# Patient Record
Sex: Female | Born: 1955
Health system: Southern US, Community
[De-identification: ages and names within clinical notes are randomized; demographics above are authoritative.]

## PROBLEM LIST (undated history)

## (undated) DIAGNOSIS — T7840XA Allergy, unspecified, initial encounter: Secondary | ICD-10-CM

## (undated) DIAGNOSIS — E119 Type 2 diabetes mellitus without complications: Secondary | ICD-10-CM

## (undated) HISTORY — DX: Type 2 diabetes mellitus without complications: E11.9

## (undated) HISTORY — DX: Allergy, unspecified, initial encounter: T78.40XA

---

## 1999-01-12 ENCOUNTER — Other Ambulatory Visit: Admission: RE | Admit: 1999-01-12 | Discharge: 1999-01-12 | Payer: Self-pay | Admitting: Gynecology

## 1999-11-14 ENCOUNTER — Other Ambulatory Visit: Admission: RE | Admit: 1999-11-14 | Discharge: 1999-11-14 | Payer: Self-pay | Admitting: Gynecology

## 1999-11-14 ENCOUNTER — Encounter (INDEPENDENT_AMBULATORY_CARE_PROVIDER_SITE_OTHER): Payer: Self-pay

## 2001-01-10 ENCOUNTER — Other Ambulatory Visit: Admission: RE | Admit: 2001-01-10 | Discharge: 2001-01-10 | Payer: Self-pay | Admitting: Gynecology

## 2002-02-22 ENCOUNTER — Emergency Department (HOSPITAL_COMMUNITY): Admission: EM | Admit: 2002-02-22 | Discharge: 2002-02-22 | Payer: Self-pay | Admitting: Emergency Medicine

## 2002-02-22 ENCOUNTER — Encounter: Payer: Self-pay | Admitting: Emergency Medicine

## 2002-02-26 ENCOUNTER — Other Ambulatory Visit: Admission: RE | Admit: 2002-02-26 | Discharge: 2002-02-26 | Payer: Self-pay | Admitting: Gynecology

## 2002-02-27 ENCOUNTER — Ambulatory Visit (HOSPITAL_COMMUNITY): Admission: RE | Admit: 2002-02-27 | Discharge: 2002-02-27 | Payer: Self-pay | Admitting: Neurology

## 2002-02-27 ENCOUNTER — Encounter: Payer: Self-pay | Admitting: Neurology

## 2003-02-23 ENCOUNTER — Other Ambulatory Visit: Admission: RE | Admit: 2003-02-23 | Discharge: 2003-02-23 | Payer: Self-pay | Admitting: Gynecology

## 2004-02-17 ENCOUNTER — Other Ambulatory Visit: Admission: RE | Admit: 2004-02-17 | Discharge: 2004-02-17 | Payer: Self-pay | Admitting: Gynecology

## 2005-06-22 ENCOUNTER — Other Ambulatory Visit: Admission: RE | Admit: 2005-06-22 | Discharge: 2005-06-22 | Payer: Self-pay | Admitting: Gynecology

## 2006-06-25 ENCOUNTER — Other Ambulatory Visit: Admission: RE | Admit: 2006-06-25 | Discharge: 2006-06-25 | Payer: Self-pay | Admitting: Gynecology

## 2008-01-13 ENCOUNTER — Other Ambulatory Visit: Admission: RE | Admit: 2008-01-13 | Discharge: 2008-01-13 | Payer: Self-pay | Admitting: Gynecology

## 2013-11-14 ENCOUNTER — Encounter: Payer: Self-pay | Admitting: Podiatrist

## 2013-11-14 ENCOUNTER — Ambulatory Visit (INDEPENDENT_AMBULATORY_CARE_PROVIDER_SITE_OTHER): Payer: 59 | Admitting: Podiatrist

## 2013-11-14 VITALS — BP 140/79 | HR 72 | Resp 12

## 2013-11-14 DIAGNOSIS — L84 Corns and callosities: Secondary | ICD-10-CM

## 2013-11-14 NOTE — Progress Notes (Signed)
Chief Complaint  Patient presents with  . Callouses    ''both feet have callus and it hurts.''     HPI: Patient is 58 y.o. female who presents today for calluses bilateral feet-- they are painful and uncomfortable when she walks.  She has tried shoe gear changes and states the calluses continue to return.      Physical Exam GENERAL APPEARANCE: Alert, conversant. Appropriately groomed. No acute distress.  VASCULAR: Pedal pulses palpable at 2/4 DP and PT bilateral.  Capillary refill time is immediate to all digits,  Proximal to distal cooling it warm to warm.  Digital hair growth is present bilateral  NEUROLOGIC: sensation is intact epicritically and protectively to 5.07 monofilament at 5/5 sites bilateral.  Light touch is intact bilateral, vibratory sensation intact bilateral, achilles tendon reflex is intact bilateral.  MUSCULOSKELETAL: acceptable muscle strength, tone and stability bilateral.  Intrinsic muscluature intact bilateral.  Rectus appearance of foot and digits noted bilateral.   DERMATOLOGIC: large calluses present plantar right foot submet 3 and 1 region bilateral.  They are enucleated and intact integument is present after debridement.  Assessment:callus x 2  Plan: debridement of calluses carried out today.  She will be seen back when they give her problems in the future.

## 2013-11-14 NOTE — Patient Instructions (Signed)

## 2014-09-16 ENCOUNTER — Ambulatory Visit (INDEPENDENT_AMBULATORY_CARE_PROVIDER_SITE_OTHER): Payer: 59 | Admitting: Podiatrist

## 2014-09-16 ENCOUNTER — Encounter: Payer: Self-pay | Admitting: Podiatrist

## 2014-09-16 DIAGNOSIS — B351 Tinea unguium: Secondary | ICD-10-CM

## 2014-09-16 DIAGNOSIS — L84 Corns and callosities: Secondary | ICD-10-CM | POA: Diagnosis not present

## 2014-09-16 DIAGNOSIS — Z79899 Other long term (current) drug therapy: Secondary | ICD-10-CM | POA: Diagnosis not present

## 2014-09-16 MED ORDER — TERBINAFINE HCL 250 MG PO TABS: 250.0000 mg | ORAL_TABLET | Freq: Every day | ORAL | Status: DC

## 2014-09-16 MED ORDER — LACTIC ACID 10 % EX LOTN: 1.0000 "application " | TOPICAL_LOTION | Freq: Every day | CUTANEOUS | Status: DC

## 2014-09-16 NOTE — Patient Instructions (Signed)

## 2014-09-16 NOTE — Progress Notes (Signed)
   Subjective:    Patient ID: Kristina MurrayMonica P Pettie, female    DOB: 10/14/1955, 59 y.o.   MRN: [id number]  HPI  PT STATED RT FOOT GREAT TOENAIL HAVE DISCOLORATION AND THICK FOR 2 MONTHS. THE TOENAIL IS GETTING WORSE BUT IS NOT HURTING. TRIED NO TREATMENT.  Review of Systems  All other systems reviewed and are negative.      Objective:   Physical Exam  Patient is awake, alert, and oriented x 3.  In no acute distress.  Vascular status is intact with palpable pedal pulses at 2/4 DP and PT bilateral and capillary refill time within normal limits. Neurological sensation is also intact bilaterally via Semmes Weinstein monofilament at 5/5 sites. Light touch, vibratory sensation, Achilles tendon reflex is intact. Dermatological exam reveals skin color, turger and texture as normal. No open lesions present.  Musculature intact with dorsiflexion, plantarflexion, inversion, eversion.  Right great toenail is thick, discolored, and does appear mycotic in nature.  It is bothersome and painful to the patient.  Mycotic infection is likely present.     Assessment & Plan:   Mycotic toenail infection of the right great toenail  Plan:  Discussed options and alternatives.  Due to the extent of the infection, recommended lamisil.  rx for the medication as well as blood work was dispensed.  i will see her back for a recheck of the lamisil in 4 weeks and repeat blood test will be ordered.

## 2014-09-17 ENCOUNTER — Telehealth: Payer: Self-pay | Admitting: *Deleted

## 2014-09-17 LAB — HEPATIC FUNCTION PANEL
ALT: 25 U/L (ref 0–35)
AST: 20 U/L (ref 0–37)
Albumin: 4.1 g/dL (ref 3.5–5.2)
Alkaline Phosphatase: 80 U/L (ref 39–117)
Bilirubin, Direct: 0.1 mg/dL (ref 0.0–0.3)
Indirect Bilirubin: 0.2 mg/dL (ref 0.2–1.2)
Total Bilirubin: 0.3 mg/dL (ref 0.2–1.2)
Total Protein: 7.1 g/dL (ref 6.0–8.3)

## 2014-09-17 NOTE — Telephone Encounter (Signed)
-----   Message from Delories HeinzKathryn P Egerton, DPM sent at 09/17/2014  8:55 AM EDT ----- Regarding: labs great Labs are great! Ok to take the medication  Thanks!  E ----- Message -----    From: [facility]    Sent: 09/17/2014   1:44 AM      To: Delories HeinzKathryn P Egerton, DPM

## 2014-09-17 NOTE — Telephone Encounter (Signed)
I called patient.  Calling to let you know Dr. Irving ShowsEgerton said your labs are okay.  You can start you medication.  "Okay, thanks for calling to tell me.  I'll go pick it up."

## 2014-10-14 ENCOUNTER — Ambulatory Visit: Payer: 59 | Admitting: Podiatrist

## 2014-10-23 ENCOUNTER — Ambulatory Visit (INDEPENDENT_AMBULATORY_CARE_PROVIDER_SITE_OTHER): Payer: 59 | Admitting: Podiatry

## 2014-10-23 ENCOUNTER — Encounter: Payer: Self-pay | Admitting: Podiatry

## 2014-10-23 DIAGNOSIS — L84 Corns and callosities: Secondary | ICD-10-CM | POA: Diagnosis not present

## 2014-10-24 NOTE — Progress Notes (Signed)
Subjective:     Patient ID: Kristina Long, female   DOB: [birth year], 59 y.o.   MRN: [id number]  HPI patient presents with lesions on the plantar aspect of both feet that are sore and hard for her to cut and also that the Lamisil seems to be making a difference   Review of Systems     Objective:   Physical Exam Neurovascular status intact with thick keratotic lesions bilateral plantar feet with nails with a slight bit of proximal clearing occurring    Assessment:     Keratotic lesion secondary to pressure and mycotic nail disease    Plan:     Debride lesions on both feet with no iatrogenic bleeding and continue 30 more days of Lamisil and reappoint in approximately 3 months or earlier if needed

## 2015-03-25 ENCOUNTER — Encounter: Payer: Self-pay | Admitting: Podiatry

## 2015-03-25 ENCOUNTER — Ambulatory Visit (INDEPENDENT_AMBULATORY_CARE_PROVIDER_SITE_OTHER): Payer: 59 | Admitting: Podiatry

## 2015-03-25 VITALS — BP 119/74 | HR 83 | Resp 16

## 2015-03-25 DIAGNOSIS — L84 Corns and callosities: Secondary | ICD-10-CM

## 2015-03-25 NOTE — Progress Notes (Signed)
Subjective:     Patient ID: Kristina Long, female   DOB: 08-16-1955, 59 y.o.   MRN: [id number]  HPI patient presents with 4 calluses on both feet they get sore and thick   Review of Systems     Objective:   Physical Exam Neurovascular status intact with keratotic lesions 4 right and left foot    Assessment:     Callus formation    Plan:     Debride 8 separate lesions with no iatrogenic bleeding noted

## 2015-06-21 ENCOUNTER — Ambulatory Visit (INDEPENDENT_AMBULATORY_CARE_PROVIDER_SITE_OTHER): Payer: 59 | Admitting: Podiatry

## 2015-06-21 ENCOUNTER — Encounter: Payer: Self-pay | Admitting: Podiatry

## 2015-06-21 DIAGNOSIS — L84 Corns and callosities: Secondary | ICD-10-CM

## 2015-06-21 NOTE — Progress Notes (Signed)
Patient ID: Kristina MurrayMonica P Long, female   DOB: 1955/08/27, 59 y.o.   MRN: [id number] This patient presents the office with chief complaint of painful calluses on her right foot. She states that the calluses become painful and sore walking and wearing her shoes. She returns the office at regular intervals for treatment of these painful calluses  Objective GENERAL APPEARANCE: Alert, conversant. Appropriately groomed. No acute distress.  VASCULAR: Pedal pulses palpable at  [facility] and PT bilateral.  Capillary refill time is immediate to all digits,  Normal temperature gradient.  Digital hair growth is present bilateral  NEUROLOGIC: sensation is normal to 5.07 monofilament at 5/5 sites bilateral.  Light touch is intact bilateral, Muscle strength normal.  MUSCULOSKELETAL: acceptable muscle strength, tone and stability bilateral.  Intrinsic muscluature intact bilateral.  Rectus appearance of foot and digits noted bilateral.   DERMATOLOGIC: skin color, texture, and turgor are within normal limits.  No preulcerative lesions or ulcers  are seen, no interdigital maceration noted.  No open lesions present.  Digital nails are asymptomatic. No drainage noted.Callus sub3, sub 1 and sub IPJ right.  Heloma durum third toe right foot.  Diagnosis  Callus right foot  Treatment  Debridement of callus right foot.  RTC 3 months.Marland Kitchen.Marland Kitchen.Helane GuntherGregory Marquesha Robideau DPM

## 2015-08-27 ENCOUNTER — Encounter: Payer: Self-pay | Admitting: Sports Medicine

## 2015-08-27 ENCOUNTER — Ambulatory Visit (INDEPENDENT_AMBULATORY_CARE_PROVIDER_SITE_OTHER): Payer: 59 | Admitting: Sports Medicine

## 2015-08-27 DIAGNOSIS — L84 Corns and callosities: Secondary | ICD-10-CM | POA: Diagnosis not present

## 2015-08-27 DIAGNOSIS — E119 Type 2 diabetes mellitus without complications: Secondary | ICD-10-CM

## 2015-08-27 DIAGNOSIS — B353 Tinea pedis: Secondary | ICD-10-CM

## 2015-08-27 MED ORDER — CLOTRIMAZOLE 1 % EX SOLN: 1.0000 "application " | Freq: Two times a day (BID) | CUTANEOUS | Status: DC

## 2015-08-27 NOTE — Progress Notes (Signed)
Patient ID: TEMPERENCE ZENOR, female   DOB: 1955/09/10, 60 y.o.   MRN: [id number] Subjective: Kristina Long is a 60 y.o. female patient with history of type 2 diabetes who presents to office today complaining of long, painful callus while ambulating in shoes; unable to trim. Patient states that the glucose reading this morning was not recorded.. Patient denies any new changes in medication or new problems. Patient denies any new cramping, numbness, burning or tingling in the legs.  There are no active problems to display for this patient.  Current Outpatient Prescriptions on File Prior to Visit  Medication Sig Dispense Refill  . Lactic Acid 10 % LOTN Apply 1 application topically daily. 1 Tube 2  . loratadine (CLARITIN) 10 MG tablet Take 10 mg by mouth daily.     No current facility-administered medications on file prior to visit.   No Known Allergies  No results found for this or any previous visit (from the past 2160 hour(s)).  Objective: General: Patient is awake, alert, and oriented x 3 and in no acute distress.  Integument: Skin is warm, dry and supple bilateral. Nails are well manicured, + right foot webspace maceration consistent with tinea. No other  signs of infection. No open lesions, + callus sub met 1,3, hallux ipj, dorsal 5th and 3rd toe. Remaining integument unremarkable.  Vasculature:  Dorsalis Pedis pulse 2/4 bilateral. Posterior Tibial pulse  2/4 bilateral.  Capillary fill time <3 sec 1-5 bilateral. Positive hair growth to the level of the digits. Temperature gradient within normal limits. No varicosities present bilateral. No edema present bilateral.   Neurology: The patient has intact sensation measured with a 5.07/10g Semmes Weinstein Monofilament at all pedal sites bilateral . Vibratory sensation intact bilateral with tuning fork. No Babinski sign present bilateral.   Musculoskeletal: Right>left hammertoe bilateral. Muscular strength 5/5 in all lower extremity  muscular groups bilateral without pain or limitation on range of motion . No tenderness with calf compression bilateral.  Assessment and Plan: Problem List Items Addressed This Visit    None    Visit Diagnoses    Tinea pedis of right foot    -  Primary    Relevant Medications    clotrimazole (LOTRIMIN) 1 % external solution    Callus of foot        Diabetes mellitus without complication (HCC)        FBS not recorded       -Examined patient. -Discussed and educated patient on diabetic foot care, especially with  regards to the vascular, neurological and musculoskeletal systems.  -Stressed the importance of good glycemic control and the detriment of not  controlling glucose levels in relation to the foot. -Mechanically debrided callus to right using sterile chisel blade without incident and medicated with salinocaine -Gave toe protectors -Recommend good supportive shoes for foot type and skin emollients -Rx Clotrimazole solution for tinea -Answered all patient questions -Patient to return as needed or in 3 months for at risk foot care -Patient advised to call the office if any problems or questions arise in the  Meantime.  Landis Martins, DPM

## 2015-09-29 ENCOUNTER — Ambulatory Visit (INDEPENDENT_AMBULATORY_CARE_PROVIDER_SITE_OTHER): Payer: 59 | Admitting: Podiatry

## 2015-09-29 ENCOUNTER — Ambulatory Visit (INDEPENDENT_AMBULATORY_CARE_PROVIDER_SITE_OTHER): Payer: 59

## 2015-09-29 ENCOUNTER — Encounter: Payer: Self-pay | Admitting: Podiatry

## 2015-09-29 DIAGNOSIS — E119 Type 2 diabetes mellitus without complications: Secondary | ICD-10-CM | POA: Diagnosis not present

## 2015-09-29 DIAGNOSIS — M79674 Pain in right toe(s): Secondary | ICD-10-CM

## 2015-09-29 DIAGNOSIS — E1149 Type 2 diabetes mellitus with other diabetic neurological complication: Secondary | ICD-10-CM

## 2015-09-29 DIAGNOSIS — M216X9 Other acquired deformities of unspecified foot: Secondary | ICD-10-CM | POA: Diagnosis not present

## 2015-09-29 DIAGNOSIS — M722 Plantar fascial fibromatosis: Secondary | ICD-10-CM

## 2015-09-29 DIAGNOSIS — E1140 Type 2 diabetes mellitus with diabetic neuropathy, unspecified: Secondary | ICD-10-CM

## 2015-09-29 NOTE — Progress Notes (Signed)
Subjective:     Patient ID: Kristina Long, female   DOB: February 15, 1956, 60 y.o.   MRN: [id number]  HPI patient presents with callus plantar third metatarsal of both feet that are very painful and making it difficult to walk. States the trimming has only been giving her partial relief   Review of Systems     Objective:   Physical Exam Neurovascular status intact with cavus deformity creating plantar keratotic lesions that are painful when pressed and making walking difficult    Assessment:     Plantarflexed metatarsal with cavus deformity creating pain    Plan:     Reviewed condition and explaining condition and at this point debridement accomplished and I discussed orthotics to try to reduce plantar pressures. Patient is scanned for customized orthotics at this time

## 2015-10-27 ENCOUNTER — Ambulatory Visit: Payer: 59 | Admitting: Podiatry

## 2015-10-29 ENCOUNTER — Ambulatory Visit (INDEPENDENT_AMBULATORY_CARE_PROVIDER_SITE_OTHER): Payer: 59 | Admitting: Podiatry

## 2015-10-29 ENCOUNTER — Encounter: Payer: Self-pay | Admitting: Podiatry

## 2015-10-29 VITALS — BP 126/77 | HR 73 | Resp 16

## 2015-10-29 DIAGNOSIS — M2040 Other hammer toe(s) (acquired), unspecified foot: Secondary | ICD-10-CM

## 2015-10-29 DIAGNOSIS — E119 Type 2 diabetes mellitus without complications: Secondary | ICD-10-CM

## 2015-10-31 NOTE — Progress Notes (Signed)
Subjective:     Patient ID: Kristina MurrayMonica P Long, female   DOB: [birth year], 60 y.o.   MRN: [id number]  HPI patient presents to pickup orthotics and also complains about digital deformities with elevating hammertoe type deformities right foot   Review of Systems     Objective:   Physical Exam Neurovascular status intact muscle strength adequate range of motion within normal limits with patient noted to have keratotic lesions third and fifth digit of the right foot with rotated fifth digit noted and also is noted to have mild plantar keratotic lesion still present    Assessment:     Hammertoe deformity digits 3 and 5 right with structural malalignment and also noted to have plantar calluses    Plan:     Reviewed hammertoe correction that could be considered and at this point debrided lesions and reappoint for evaluation again in the next several months and may need to consider arthroplasty

## 2016-09-06 ENCOUNTER — Encounter: Payer: Self-pay | Admitting: Podiatry

## 2016-09-06 ENCOUNTER — Ambulatory Visit (INDEPENDENT_AMBULATORY_CARE_PROVIDER_SITE_OTHER): Payer: 59 | Admitting: Podiatry

## 2016-09-06 DIAGNOSIS — M2040 Other hammer toe(s) (acquired), unspecified foot: Secondary | ICD-10-CM | POA: Diagnosis not present

## 2016-09-06 DIAGNOSIS — M216X9 Other acquired deformities of unspecified foot: Secondary | ICD-10-CM | POA: Diagnosis not present

## 2016-09-06 DIAGNOSIS — L84 Corns and callosities: Secondary | ICD-10-CM

## 2016-09-08 NOTE — Progress Notes (Signed)
Subjective:     Patient ID: Kristina Long, female   DOB: 01/23/1956, 61 y.o.   MRN: [id number]  HPI patient presents with significant callus formation and also digital deformities with pain in the lesser digits and a high arch foot type. Patient also presents today to pickup orthotics   Review of Systems     Objective:   Physical Exam Neurovascular status intact with significant digital deformities right over left with keratotic lesions sub-bilateral feet    Assessment:     Chronic lesion formation was structural changes of both feet with pain    Plan:     H&P discussed structural correction with orthotics dispensed and debrided lesions today. Patient will be seen back to recheck again for routine care

## 2016-09-12 DIAGNOSIS — E119 Type 2 diabetes mellitus without complications: Secondary | ICD-10-CM | POA: Diagnosis not present

## 2016-09-21 DIAGNOSIS — Z1231 Encounter for screening mammogram for malignant neoplasm of breast: Secondary | ICD-10-CM | POA: Diagnosis not present

## 2016-10-30 ENCOUNTER — Encounter: Payer: Self-pay | Admitting: Podiatry

## 2016-10-30 ENCOUNTER — Ambulatory Visit (INDEPENDENT_AMBULATORY_CARE_PROVIDER_SITE_OTHER): Payer: 59 | Admitting: Podiatry

## 2016-10-30 DIAGNOSIS — E1149 Type 2 diabetes mellitus with other diabetic neurological complication: Secondary | ICD-10-CM | POA: Diagnosis not present

## 2016-10-30 DIAGNOSIS — E1140 Type 2 diabetes mellitus with diabetic neuropathy, unspecified: Secondary | ICD-10-CM | POA: Diagnosis not present

## 2016-10-30 DIAGNOSIS — L84 Corns and callosities: Secondary | ICD-10-CM

## 2016-10-30 DIAGNOSIS — Q828 Other specified congenital malformations of skin: Secondary | ICD-10-CM | POA: Diagnosis not present

## 2016-11-01 NOTE — Progress Notes (Signed)
Subjective:    Patient ID: Kristina Long, female   DOB: 61 y.o.   MRN: [id number]   HPI patient presents with long-term diabetic neuropathy and lesion formation bilateral that are painful and she cannot cut    ROS      Objective:  Physical Exam Diminished neurological status with long-term diabetes and plantar keratotic lesion bilateral    Assessment:   Porokeratotic lesion with at risk diabetic with neuropathy      Plan:     Debris lesions bilateral with no iatrogenic bleeding noted

## 2017-01-26 ENCOUNTER — Ambulatory Visit (INDEPENDENT_AMBULATORY_CARE_PROVIDER_SITE_OTHER): Payer: 59 | Admitting: Podiatry

## 2017-01-26 ENCOUNTER — Encounter: Payer: Self-pay | Admitting: Podiatry

## 2017-01-26 DIAGNOSIS — Q828 Other specified congenital malformations of skin: Secondary | ICD-10-CM | POA: Diagnosis not present

## 2017-01-26 DIAGNOSIS — E1140 Type 2 diabetes mellitus with diabetic neuropathy, unspecified: Secondary | ICD-10-CM | POA: Diagnosis not present

## 2017-01-26 DIAGNOSIS — E1149 Type 2 diabetes mellitus with other diabetic neurological complication: Secondary | ICD-10-CM

## 2017-01-26 NOTE — Progress Notes (Signed)
Subjective:    Patient ID: Kristina MurrayMonica P Schnackenberg, female   DOB: 61 y.o.   MRN: [id number]   HPI long-term diabetic presents with chronic callus formation bilateral that become painful and she cannot cut with numerous risk factors    ROS      Objective:  Physical Exam neurovascular status intact with patient noted to have diminishment sharp Dole vibratory bilateral and long-term diabetes with severe keratotic lesion subsecond fifth metatarsals bilateral     Assessment:    Porokeratotic type lesions with diabetic neuropathy as, getting factor     Plan:    Deep debridement of lesions accomplished no iatrogenic bleeding and reappoint for routine care

## 2017-03-16 DIAGNOSIS — Z7984 Long term (current) use of oral hypoglycemic drugs: Secondary | ICD-10-CM | POA: Diagnosis not present

## 2017-03-16 DIAGNOSIS — E1165 Type 2 diabetes mellitus with hyperglycemia: Secondary | ICD-10-CM | POA: Diagnosis not present

## 2017-04-05 ENCOUNTER — Ambulatory Visit (INDEPENDENT_AMBULATORY_CARE_PROVIDER_SITE_OTHER): Payer: 59 | Admitting: Podiatry

## 2017-04-05 ENCOUNTER — Encounter: Payer: Self-pay | Admitting: Podiatry

## 2017-04-05 DIAGNOSIS — M2040 Other hammer toe(s) (acquired), unspecified foot: Secondary | ICD-10-CM | POA: Diagnosis not present

## 2017-04-05 DIAGNOSIS — E1149 Type 2 diabetes mellitus with other diabetic neurological complication: Secondary | ICD-10-CM | POA: Diagnosis not present

## 2017-04-05 DIAGNOSIS — M216X9 Other acquired deformities of unspecified foot: Secondary | ICD-10-CM

## 2017-04-05 DIAGNOSIS — Q828 Other specified congenital malformations of skin: Secondary | ICD-10-CM | POA: Diagnosis not present

## 2017-04-05 DIAGNOSIS — E1140 Type 2 diabetes mellitus with diabetic neuropathy, unspecified: Secondary | ICD-10-CM

## 2017-04-05 NOTE — Progress Notes (Signed)
   Subjective:    Patient ID: Kristina Long, female    DOB: [birth year], 61 y.o.   MRN: [id number]  HPI    Review of Systems  All other systems reviewed and are negative.      Objective:   Physical Exam        Assessment & Plan:

## 2017-04-05 NOTE — Progress Notes (Signed)
Subjective:    Patient ID: Kristina Long, female   DOB: 61 y.o.   MRN: [id number]   HPI patient presents stating this callus on the right foot is still really bothering me and making it hard to walk. Patient states that she cannot go barefoot and only can wear certain shoes    ROS      Objective:  Physical Exam neurovascular status intact with severe keratotic lesion sub-third metatarsal right with elevation of the third toe with keratotic lesions and keratotic lesion left it's not as deep     Assessment:   Surgical plantar flexion of the metatarsal with digital hammertoe deformity with severe keratotic tissue formation      Plan:    H&P condition reviewed and at this point I do think at one point elevating osteotomy with digital fusion may be necessary. I educated her on this and discuss what would be required and at this point were to go ahead and do deep debridement today and continue to monitor her with consideration for surgery at one point in future

## 2017-04-10 DIAGNOSIS — Z23 Encounter for immunization: Secondary | ICD-10-CM | POA: Diagnosis not present

## 2017-05-07 DIAGNOSIS — Z13820 Encounter for screening for osteoporosis: Secondary | ICD-10-CM | POA: Diagnosis not present

## 2017-05-07 DIAGNOSIS — Z0142 Encounter for cervical smear to confirm findings of recent normal smear following initial abnormal smear: Secondary | ICD-10-CM | POA: Diagnosis not present

## 2017-05-07 DIAGNOSIS — E119 Type 2 diabetes mellitus without complications: Secondary | ICD-10-CM | POA: Diagnosis not present

## 2017-05-07 DIAGNOSIS — Z01419 Encounter for gynecological examination (general) (routine) without abnormal findings: Secondary | ICD-10-CM | POA: Diagnosis not present

## 2017-06-08 ENCOUNTER — Encounter: Payer: Self-pay | Admitting: Podiatry

## 2017-06-08 ENCOUNTER — Ambulatory Visit: Payer: 59 | Admitting: Podiatry

## 2017-06-08 DIAGNOSIS — E1140 Type 2 diabetes mellitus with diabetic neuropathy, unspecified: Secondary | ICD-10-CM | POA: Diagnosis not present

## 2017-06-08 DIAGNOSIS — E1149 Type 2 diabetes mellitus with other diabetic neurological complication: Secondary | ICD-10-CM

## 2017-06-08 DIAGNOSIS — Q828 Other specified congenital malformations of skin: Secondary | ICD-10-CM | POA: Diagnosis not present

## 2017-06-09 NOTE — Progress Notes (Signed)
Subjective:   Patient ID: Kristina Long, female   DOB: 61 y.o.   MRN: [id number]   HPI Long-term diabetic with severe lesions plantar aspect both feet that become very tender with ambulation   ROS      Objective:  Physical Exam  Neurovascular status intact with keratotic lesions bilateral that are painful when pressed     Assessment:  Lesions that are due to chronic pressure with neurological symptoms associated with her long-term diabetes     Plan:  Reviewed diabetic inspections on a daily basis and debrided the lesions bilateral with no iatrogenic bleeding noted

## 2017-06-20 DIAGNOSIS — E1165 Type 2 diabetes mellitus with hyperglycemia: Secondary | ICD-10-CM | POA: Diagnosis not present

## 2017-08-03 ENCOUNTER — Encounter: Payer: Self-pay | Admitting: Podiatry

## 2017-08-03 ENCOUNTER — Ambulatory Visit: Payer: 59 | Admitting: Podiatry

## 2017-08-03 DIAGNOSIS — Q828 Other specified congenital malformations of skin: Secondary | ICD-10-CM

## 2017-08-03 DIAGNOSIS — E1149 Type 2 diabetes mellitus with other diabetic neurological complication: Secondary | ICD-10-CM | POA: Diagnosis not present

## 2017-08-03 DIAGNOSIS — E1140 Type 2 diabetes mellitus with diabetic neuropathy, unspecified: Secondary | ICD-10-CM | POA: Diagnosis not present

## 2017-08-03 NOTE — Progress Notes (Signed)
Subjective:   Patient ID: Kristina MurrayMonica P Favia, female   DOB: 62 y.o.   MRN: [id number]   HPI Patient presents with chronic lesion sub-both feet with long-term diabetes   ROS      Objective:  Physical Exam  No change neurovascular status with thick keratotic lesions plantar aspect of both feet     Assessment:  Chronic lesion formation secondary to pressure with diabetes as precipitating factor     Plan:  Debride painful lesions bilateral with no iatrogenic bleeding and reappoint to reevaluate

## 2017-10-05 ENCOUNTER — Ambulatory Visit: Payer: 59 | Admitting: Podiatry

## 2017-10-11 ENCOUNTER — Ambulatory Visit: Payer: 59 | Admitting: Podiatry

## 2017-10-11 ENCOUNTER — Encounter: Payer: Self-pay | Admitting: Podiatry

## 2017-10-11 DIAGNOSIS — E1140 Type 2 diabetes mellitus with diabetic neuropathy, unspecified: Secondary | ICD-10-CM | POA: Diagnosis not present

## 2017-10-11 DIAGNOSIS — Q828 Other specified congenital malformations of skin: Secondary | ICD-10-CM | POA: Diagnosis not present

## 2017-10-11 DIAGNOSIS — E1149 Type 2 diabetes mellitus with other diabetic neurological complication: Secondary | ICD-10-CM

## 2017-10-11 NOTE — Progress Notes (Signed)
Subjective:   Patient ID: Kristina Long, female   DOB: 62 y.o.   MRN: [id number]   HPI Long-term diabetic with severe lesion formation bilateral that become very painful   ROS      Objective:  Physical Exam  Diminished neurovascular status with long-term diabetes with severe lesion formation bilateral     Assessment:  At risk neurological diabetic condition with lesion formation bilateral     Plan:  Deep debridement of all lesions with sharp instrumentation with no iatrogenic bleeding and reappoint to recheck

## 2017-10-12 DIAGNOSIS — Z1231 Encounter for screening mammogram for malignant neoplasm of breast: Secondary | ICD-10-CM | POA: Diagnosis not present

## 2017-11-16 DIAGNOSIS — E119 Type 2 diabetes mellitus without complications: Secondary | ICD-10-CM | POA: Diagnosis not present

## 2017-12-14 ENCOUNTER — Ambulatory Visit: Payer: 59 | Admitting: Podiatry

## 2017-12-14 ENCOUNTER — Encounter: Payer: Self-pay | Admitting: Podiatry

## 2017-12-14 DIAGNOSIS — E1149 Type 2 diabetes mellitus with other diabetic neurological complication: Secondary | ICD-10-CM | POA: Diagnosis not present

## 2017-12-14 DIAGNOSIS — Q828 Other specified congenital malformations of skin: Secondary | ICD-10-CM

## 2017-12-14 DIAGNOSIS — E1140 Type 2 diabetes mellitus with diabetic neuropathy, unspecified: Secondary | ICD-10-CM

## 2017-12-14 NOTE — Progress Notes (Signed)
Subjective:   Patient ID: Kristina MurrayMonica P Long, female   DOB: 62 y.o.   MRN: [id number]   HPI Patient presents with severe lesion formation bilateral   ROS      Objective:  Physical Exam  Neurovascular status intact with thick keratotic lesion plantar aspect bilateral with patient with long-term diabetes     Assessment:  Chronic lesion formation with patient with long-term diabetes     Plan:  Debrided lesions bilateral with no iatrogenic bleeding can continue routine care

## 2018-01-29 ENCOUNTER — Ambulatory Visit: Payer: 59 | Admitting: Podiatry

## 2018-01-29 ENCOUNTER — Encounter: Payer: Self-pay | Admitting: Podiatry

## 2018-01-29 DIAGNOSIS — L84 Corns and callosities: Secondary | ICD-10-CM

## 2018-01-29 DIAGNOSIS — M2040 Other hammer toe(s) (acquired), unspecified foot: Secondary | ICD-10-CM

## 2018-01-29 DIAGNOSIS — E1140 Type 2 diabetes mellitus with diabetic neuropathy, unspecified: Secondary | ICD-10-CM | POA: Diagnosis not present

## 2018-01-29 DIAGNOSIS — Q828 Other specified congenital malformations of skin: Secondary | ICD-10-CM | POA: Diagnosis not present

## 2018-01-29 DIAGNOSIS — E1149 Type 2 diabetes mellitus with other diabetic neurological complication: Secondary | ICD-10-CM

## 2018-01-29 NOTE — Progress Notes (Signed)
This patient presents to the office for continued treatment of her painful callus both feet.  She says the calluses are painful walking and wearing her shoes.  She says the callus on the bottom of her right forefoot and her pinch callus right big toe are the most painful.  She says years ago. Her mother had an injection at the site of a hammertoe callus and the toe blew up.  Therefore, she says she is not interested in any surgical correction.  She presents the office today for preventative foot care services.  General Appearance  Alert, conversant and in no acute stress.  Vascular  Dorsalis pedis and posterior tibial  pulses are palpable  bilaterally.  Capillary return is within normal limits  bilaterally. Temperature is within normal limits  bilaterally.  Neurologic  Senn-Weinstein monofilament wire test within normal limits  bilaterally. Muscle power within normal limits bilaterally.  Nails Normal nails noted with no evidence of bacterial or fungal infection.  Orthopedic  No limitations of motion of motion feet .  No crepitus or effusions noted.  Hammer toes 2-5  B/L.  Skin  normotropic skin  noted bilaterally.  No signs of infections or ulcers noted.  IPK sub 3 right foot.  Heloma durum 3rd toe right and fifth toes  B/l.  Pinch callus right hallux.  Callus  B/L  Debridement of callus  B/l.  RTC prn.   Helane GuntherGregory Vandy Tsuchiya DPM.

## 2018-02-18 DIAGNOSIS — E1165 Type 2 diabetes mellitus with hyperglycemia: Secondary | ICD-10-CM | POA: Diagnosis not present

## 2018-04-11 ENCOUNTER — Encounter: Payer: Self-pay | Admitting: Internal Medicine

## 2018-04-11 ENCOUNTER — Ambulatory Visit (INDEPENDENT_AMBULATORY_CARE_PROVIDER_SITE_OTHER): Payer: 59 | Admitting: Internal Medicine

## 2018-04-11 VITALS — BP 116/72 | HR 71 | Ht 70.0 in | Wt 214.0 lb

## 2018-04-11 DIAGNOSIS — E119 Type 2 diabetes mellitus without complications: Secondary | ICD-10-CM

## 2018-04-11 LAB — POCT GLYCOSYLATED HEMOGLOBIN (HGB A1C): Hemoglobin A1C: 7 % — AB (ref 4.0–5.6)

## 2018-04-11 MED ORDER — METFORMIN HCL 500 MG PO TABS: 1000.0000 mg | ORAL_TABLET | Freq: Two times a day (BID) | ORAL | 11 refills | Status: DC

## 2018-04-11 NOTE — Patient Instructions (Addendum)
-   Please increase Metformin to two tablets twice a day with meals. - Continue Januvia 100 mg daily - If you are interested in a GLP-1 Agonist such as Trulicity or Ozempic which are once weekly injections please contact our office.  - Please avoid all sugar sweetened beverages when possible. - If you change your mind about seeing a nutritionist or a dietician please contact your office.

## 2018-04-11 NOTE — Progress Notes (Signed)
Name: Kristina Long  MRN/ DOB: [id number], December 27, 1955   Age/ Sex: 62 y.o., female    PCP: Blair Heys, MD   Reason for Endocrinology Evaluation: Type 2 Diabetes Mellitus  Date of Initial Endocrinology Visit: 04/12/2018     PATIENT IDENTIFIER: Kristina Long is a 62 y.o.  female with a past medical history of T2DM, Seasonal allergies and OSA. The patient presented for initial endocrinology clinic visit on 04/12/2018 for consultative assistance with her diabetes management.    HPI: Kristina Long was diagnosed with DM in 2013. She has been on Metformin since her diagnosis. As of late her A1c   Her hemoglobin A1c has ranged from  in 7.3% in 2018, peaking at 10.5 % in 2013.  Currently she does not check blood sugars. She takes Metformin mainly once a day and tends to sometimes forget the 2nd dose in the evening. She has been tried on [place] but she just "doesn't feel good on them " when asked to elaborate on her symptoms, she states it just doesn't make her feel like herself.   She also was prescribed Pioglitazone by her PCP but she never took it after she read the cardiac side effects. She denies any hypoglycemia at home     Her last A1c in September was 7.3 %, patient admits she was on a poor diet but has been trying to improve her oral intake of carbohydrates.  She however continues to drink sugar-sweetened beverages " not as much" . She is pretty adamant that "sugar such as raw sugar, fructose corn syrup etc" is much better then artifical sweeteners. She is under the impression that artifical sweetners cause cancer .   HOME DIABETES REGIMEN: Metformin 500 mg 2 tabs in Am and 2 tabs in the evening.  Sitagliptin 100 mg daily      Statin: No ACE-I/ARB: No Prior Diabetic Education: Declines.    METER DOWNLOAD SUMMARY: Did not bring meter or glucose log.       DIABETIC COMPLICATIONS: Microvascular complications:    Denies:  Neuropathy, retinopathy , nephropthay  Last eye exam: Completed  04/2017  Macrovascular complications:    Denies: CAD, CVA, PVD   PAST HISTORY: Past Medical History:  Past Medical History:  Diagnosis Date  . Allergy   . Diabetes mellitus without complication [facility])     Past Surgical History: N/A  Social History:  reports that she has never smoked. She has never used smokeless tobacco. She reports that she does not drink alcohol or use drugs.  Family History: Father- DM  HOME MEDICATIONS: Allergies as of 04/11/2018   No Known Allergies     Medication List        Accurate as of 04/11/18 11:59 PM. Always use your most recent med list.          amoxicillin 500 MG capsule Commonly known as:  AMOXIL Take 500 mg by mouth. Take 1 capsule 4 times a day for dental infection   calcium carbonate 600 MG tablet Commonly known as:  OS-CAL Take 600 mg by mouth daily.   loratadine 10 MG tablet Commonly known as:  CLARITIN Take 10 mg by mouth daily.   metFORMIN 500 MG tablet Commonly known as:  GLUCOPHAGE Take 2 tablets (1,000 mg total) by mouth 2 (two) times daily.   sitaGLIPtin 100 MG tablet Commonly known as:  JANUVIA Take 100 mg by mouth daily.  ALLERGIES: No Known Allergies   REVIEW OF SYSTEMS: A comprehensive ROS was conducted with the patient and is negative except as per HPI and below:  Review of Systems  Constitutional: Negative.   HENT: Negative.   Respiratory: Negative.   Cardiovascular: Negative.   Gastrointestinal: Negative.   Genitourinary: Positive for frequency.       In am when she uses the CPAP machine   Musculoskeletal: Negative.   Skin: Negative.   Neurological: Negative.   Endo/Heme/Allergies: Negative.   Psychiatric/Behavioral: Negative.       OBJECTIVE:   VITAL SIGNS: BP 116/72 (BP Location: Right Arm, Patient Position: Sitting)   Pulse 71   Ht 5\' 10"  (1.778 m)   Wt 97.1 kg   LMP  (LMP Unknown)   SpO2 98%   BMI 30.71  kg/m    PHYSICAL EXAM:  General: Pt appears well and is in NAD  Hydration: Well-hydrated with moist mucous membranes and good skin turgor  HEENT: Head: Unremarkable with good dentition. Oropharynx clear without exudate.  Eyes: External eye exam normal without stare, lid lag or exophthalmos.  EOM intact.  PERRL.  Neck: General: Supple without adenopathy or carotid bruits. Thyroid: Thyroid size normal.  No goiter or nodules appreciated. No thyroid bruit.  Lungs: Clear with good BS bilat with no rales, rhonchi, or wheezes  Heart: RRR with normal S1 and S2 and no gallops; no murmurs; no rub  Abdomen: Normoactive bowel sounds, soft, nontender, without masses or organomegaly palpable  Extremities:  Lower extremities - No pretibial edema. No lesions.  Skin: Normal texture and temperature to palpation. No rash noted. No Acanthosis nigricans/skin tags. No lipohypertrophy.  Neuro: MS is good with appropriate affect, pt is alert and Ox3    DM foot exam: 04/11/2018  Patient multiple callous formation of the dorsal surface of the toes and plantar surface of feet bilaterally.  The pedal pulses are 2+ on right and 2+ on left. The sensation is intact to a screening 5.07, 10 gram monofilament bilaterally    DATA REVIEWED:  Results for Kristina Long, Kristina Long (MRN [id number]) as of 04/12/2018 08:03  Ref. Range 04/11/2018 09:20  Hemoglobin A1C Latest Ref Range: 4.0 - 5.6 % 7.0 (A)     (11/2017)a1C 7.9 %    ASSESSMENT / PLAN / RECOMMENDATIONS:   1) Type 2 Diabetes Mellitus, sub-optimally controlled, without  complications - Most recent A1c of 7.0 %. Goal A1c < 7.0 %.    Plan: GENERAL:  Patient is motivated to improve her diet intake. She is however adamant that she will not attempt any sugar-free drinks and she would rather drink drinks with raw sugar or corn syrup rather then sweeteners because she is under the impression that sweeteners cause cancer.   Attempts to explain otherwise have not been  successful but she stated that she will drink sugar-sweetened beverages in moderation.   She is already on a DPP-4 inhibitor and Metformin. She was intolerant to SGLT-2 inhibitors. She declined pioglitazone. We also discussed GL-1 agonists and SU but patient would rather work on her  Diet.   She denied a referral to CDE/RD   MEDICATIONS:  Increase Metformin to 500 mg TWO Tabs BID  Continue Januvia 100 mg Daily   EDUCATION / INSTRUCTIONS:  BG monitoring instructions: Patient is instructed to check her blood sugars1 times a day, fasting   2) Diabetic complications:   Eye: She does not have known diabetic retinopathy. Last eye exam was less than 1 year ago.  Neuro/ Feet: does not have known diabetic peripheral neuropathy.  Renal: Patient does not have known baseline CKD.     3) Lipids: Patient is not on a statin. Will defer to PCP . Patient needs to be on a moderate- high intensity statin depending on her CV risk.       F/U in 4 months  Signed electronically by: Lyndle Herrlich, MD  [facility] Endocrinology  [facility] [address] [place], [place] [address] Phone: [phone] FAX: [phone]   CC: Blair Heys, MD [address] [place] [address] Phone: [phone]  Fax: [phone]    Return to Endocrinology clinic as below: Future Appointments  Date Time Provider Department Center  08/16/2018  8:50 AM Shammleffer, Konrad Dolores, MD [facility] None

## 2018-04-13 DIAGNOSIS — Z23 Encounter for immunization: Secondary | ICD-10-CM | POA: Diagnosis not present

## 2018-05-20 DIAGNOSIS — E119 Type 2 diabetes mellitus without complications: Secondary | ICD-10-CM | POA: Diagnosis not present

## 2018-05-27 ENCOUNTER — Encounter: Payer: Self-pay | Admitting: Internal Medicine

## 2018-05-27 NOTE — Progress Notes (Signed)
Received labs dated 05/20/2018   Gluc 120 mg/dL BUN 19 mg/dL Cr 0.450.75 mg/dL  GFR 94  T.chol 409146 mg/dL HDL 42 TG 62 LDL 92   Her estimated 10-year ASCVD risk is 10.7% which places her in the intermediate risk category.    Moderate intensity statin is indicated .   Will discuss with pt on next visit    Orland PenmanIbtehal Jaralla Youlanda Tomassetti

## 2018-06-14 ENCOUNTER — Encounter: Payer: Self-pay | Admitting: Podiatry

## 2018-06-14 ENCOUNTER — Ambulatory Visit: Payer: 59 | Admitting: Podiatry

## 2018-06-14 DIAGNOSIS — E1149 Type 2 diabetes mellitus with other diabetic neurological complication: Secondary | ICD-10-CM | POA: Diagnosis not present

## 2018-06-14 DIAGNOSIS — Q828 Other specified congenital malformations of skin: Secondary | ICD-10-CM | POA: Diagnosis not present

## 2018-06-14 DIAGNOSIS — H25813 Combined forms of age-related cataract, bilateral: Secondary | ICD-10-CM | POA: Diagnosis not present

## 2018-06-14 DIAGNOSIS — W450XXA Nail entering through skin, initial encounter: Secondary | ICD-10-CM

## 2018-06-14 DIAGNOSIS — E119 Type 2 diabetes mellitus without complications: Secondary | ICD-10-CM | POA: Diagnosis not present

## 2018-06-14 DIAGNOSIS — E1140 Type 2 diabetes mellitus with diabetic neuropathy, unspecified: Secondary | ICD-10-CM

## 2018-06-14 DIAGNOSIS — M2040 Other hammer toe(s) (acquired), unspecified foot: Secondary | ICD-10-CM

## 2018-06-14 DIAGNOSIS — L84 Corns and callosities: Secondary | ICD-10-CM | POA: Diagnosis not present

## 2018-06-14 NOTE — Progress Notes (Signed)
This patient presents to the office for continued treatment of her painful callus both feet.  She says the calluses are painful walking and wearing her shoes.  She says that the callus on her right forefoot is her most painful callus.  She says this causes significant pain walking and wearing her shoes.  She says she is trimming her own calluses at home but this becomes very significantly painful.  She also says that she has developed a big toenail  on her right foot which has become detached from the nailbed.  She says this just happened and she asked me to look at her right big toe toenail.  General Appearance  Alert, conversant and in no acute stress.  Vascular  Dorsalis pedis and posterior tibial  pulses are palpable  bilaterally.  Capillary return is within normal limits  bilaterally. Temperature is within normal limits  bilaterally.  Neurologic  Senn-Weinstein monofilament wire test within normal limits  bilaterally. Muscle power within normal limits bilaterally.  Nails Normal nails noted with no evidence of bacterial or fungal infection.  Thick disfigured discolored hallux toenail that has detached from the nail bed.  No signs of redness swelling or infection.  Orthopedic  No limitations of motion of motion feet .  No crepitus or effusions noted.  Hammer toes 2-5  B/L.  Skin  normotropic skin  noted bilaterally.  No signs of infections or ulcers noted.  IPK sub 3 right foot.  [place] durum 3rd toe right and fifth toes  B/l.  Pinch callus right hallux.  Callus  B/L  Debridement of callus  B/l.  RTC prn.  Discussed her callus with this patient.  Told her we could acquire  Diabetic shoes for her in the future if needed.  We can also consider nail surgery for the permanent removal of the right hallux toenail.   Helane GuntherGregory Kratos Ruscitti DPM.

## 2018-08-16 ENCOUNTER — Ambulatory Visit: Payer: 59 | Admitting: Internal Medicine

## 2018-10-21 DIAGNOSIS — Z6830 Body mass index (BMI) 30.0-30.9, adult: Secondary | ICD-10-CM | POA: Diagnosis not present

## 2018-10-21 DIAGNOSIS — Z01419 Encounter for gynecological examination (general) (routine) without abnormal findings: Secondary | ICD-10-CM | POA: Diagnosis not present

## 2018-10-21 DIAGNOSIS — G4733 Obstructive sleep apnea (adult) (pediatric): Secondary | ICD-10-CM | POA: Diagnosis not present

## 2019-02-06 ENCOUNTER — Encounter: Payer: Self-pay | Admitting: Internal Medicine

## 2019-05-14 ENCOUNTER — Other Ambulatory Visit: Payer: Self-pay

## 2019-05-16 ENCOUNTER — Encounter: Payer: Self-pay | Admitting: Internal Medicine

## 2019-05-16 ENCOUNTER — Ambulatory Visit (INDEPENDENT_AMBULATORY_CARE_PROVIDER_SITE_OTHER): Payer: Medicare Other | Admitting: Internal Medicine

## 2019-05-16 VITALS — BP 132/88 | HR 68 | Ht 70.0 in | Wt 204.8 lb

## 2019-05-16 DIAGNOSIS — E1165 Type 2 diabetes mellitus with hyperglycemia: Secondary | ICD-10-CM | POA: Diagnosis not present

## 2019-05-16 DIAGNOSIS — Z794 Long term (current) use of insulin: Secondary | ICD-10-CM

## 2019-05-16 MED ORDER — ONETOUCH VERIO VI STRP: ORAL_STRIP | 12 refills | Status: DC

## 2019-05-16 MED ORDER — GLIPIZIDE 5 MG PO TABS: 5.0000 mg | ORAL_TABLET | Freq: Every day | ORAL | 3 refills | Status: DC

## 2019-05-16 NOTE — Patient Instructions (Addendum)
-   Continue Metformin 500 mg 2 tablets twice daily - Glipizide 5 mg before breakfast    - Check sugar before breakfast and supper     - HOW TO TREAT LOW BLOOD SUGARS (Blood sugar LESS THAN 70 MG/DL)  Please follow the RULE OF 15 for the treatment of hypoglycemia treatment (when your (blood sugars are less than 70 mg/dL)    STEP 1: Take 15 grams of carbohydrates when your blood sugar is low, which includes:   3-4 GLUCOSE TABS  OR  3-4 OZ OF JUICE OR REGULAR SODA OR  ONE TUBE OF GLUCOSE GEL     STEP 2: RECHECK blood sugar in 15 MINUTES STEP 3: If your blood sugar is still low at the 15 minute recheck --> then, go back to STEP 1 and treat AGAIN with another 15 grams of carbohydrates.

## 2019-05-16 NOTE — Progress Notes (Signed)
Name: Kristina Long  Age/ Sex: 63 y.o., female   MRN/ DOB: [id number], 06-19-1956     PCP: Clayborn Heron, MD   Reason for Endocrinology Evaluation: Type 2 Diabetes Mellitus  Initial Endocrine Consultative Visit: 04/11/2018    PATIENT IDENTIFIER: [name] is a 63 y.o. female with a past medical history of T2DM, Seasonal allergies and OSA. The patient has followed with Endocrinology clinic since 04/12/2019 for consultative assistance with management of her diabetes.  DIABETIC HISTORY:  Kristina Long was diagnosed with DM in 2013. She has been on Metformin since her diagnosis. She was prescribed Pioglitazone by her PCP in the past  but she never took it after she read the cardiac side effects. Her hemoglobin A1c has ranged from  in 7.3% in 2018, peaking at 10.5 % in 2013.   SUBJECTIVE:   During the last visit (04/12/2018): We increased metformin and continued [place]   Today (05/16/2019): Kristina Long is here for a follow up on diabetes management.She has not been here in a year. In the interim, Januvia has been switched to Trajenta, but once she became on medicare the price became cost prohibitive.  She checks her blood sugars rarely.  The patient has not had hypoglycemic episodes since the last clinic visit. Otherwise, the patient has not required any recent emergency interventions for hypoglycemia and has not had recent hospitalizations secondary to hyper or hypoglycemic episodes.       ROS: As per HPI and as detailed below: Review of Systems  HENT: Negative for congestion and sore throat.   Respiratory: Negative for cough and shortness of breath.   Cardiovascular: Negative for chest pain and palpitations.  Gastrointestinal: Negative for diarrhea and nausea.  Neurological: Negative for seizures.      HOME DIABETES REGIMEN:  Metformin 500 mg 2 tabs BID      METER DOWNLOAD SUMMARY: Did not bring    HISTORY:  Past Medical History:  Past Medical  History:  Diagnosis Date  . Allergy   . Diabetes mellitus without complication [facility])     Past Surgical History: No past surgical history on file.  Social History:  reports that she has never smoked. She has never used smokeless tobacco. She reports that she does not drink alcohol or use drugs. Family History:  Family History  Problem Relation Age of Onset  . Diabetes Father   . Diabetes Maternal Grandmother      HOME MEDICATIONS: Allergies as of 05/16/2019   No Known Allergies     Medication List       Accurate as of May 16, 2019 10:25 AM. If you have any questions, ask your nurse or doctor.        calcium carbonate 600 MG tablet Commonly known as: OS-CAL Take 600 mg by mouth daily.   glipiZIDE 5 MG tablet Commonly known as: GLUCOTROL Take 1 tablet (5 mg total) by mouth daily before breakfast. Started by: Scarlette Shorts, MD   loratadine 10 MG tablet Commonly known as: CLARITIN Take 10 mg by mouth daily.   metFORMIN 500 MG tablet Commonly known as: GLUCOPHAGE Take 2 tablets (1,000 mg total) by mouth 2 (two) times daily.   OneTouch Verio test strip Generic drug: glucose blood Use as instructed What changed: See the new instructions. Changed by: Scarlette Shorts, MD   sitaGLIPtin 100 MG tablet Commonly known as: JANUVIA Take 100 mg by mouth daily.        OBJECTIVE:  Vital Signs: BP 132/88   Pulse 68   Ht 5\' 10"  (1.778 m)   Wt 204 lb 12.8 oz (92.9 kg)   LMP  (LMP Unknown)   SpO2 97%   BMI 29.39 kg/m   Wt Readings from Last 3 Encounters:  05/16/19 204 lb 12.8 oz (92.9 kg)  04/11/18 214 lb (97.1 kg)     Exam: General: Pt appears well and is in NAD  Lungs: Clear with good BS bilat with no rales, rhonchi, or wheezes  Heart: RRR with normal S1 and S2 and no gallops; no murmurs; no rub  Abdomen: Normoactive bowel sounds, soft, nontender, without masses or organomegaly palpable  Extremities: No pretibial edema.  Skin: Normal  texture and temperature to palpation.  Neuro: MS is good with appropriate affect, pt is alert and Ox3    DM foot exam: 05/16/19     The skin of the feet is without sores or ulcerations, but with multiple callous formation  The pedal pulses are 2+ on right and 2+ on left. The sensation is intact to a screening 5.07, 10 gram monofilament bilaterally    DATA REVIEWED:  A1c 7.7 03/31/2019  ASSESSMENT / PLAN / RECOMMENDATIONS:   1) Type 2 Diabetes Mellitus, sub-optimally controlled, without  complications - Most recent A1c of 7.0 %. Goal A1c < 7.0 %.    - We discussed the importance of lifestyle changes in improving glycemic control - We also discussed the importance of glucose checks at home and to have the meter on next visit, she was trained on using it today  - DPP-4 inhibitors are too costly. We discussed adding a SU, we discussed side effects of weight gain, she is not happy about this, I have encouraged her to exercise and improve her diet to offset weight gain  MEDICATIONS: - Continue Metformin 500 mg 2 tablets twice daily - Glipizide 5 mg before breakfast    EDUCATION / INSTRUCTIONS:  BG monitoring instructions: Patient is instructed to check her blood sugars 2 times a day, fasting and supper time.  Call [facility] if: BG persistently < 70 or > 300. . I reviewed the Rule of 15 for the treatment of hypoglycemia in detail with the patient. Literature supplied.   This visit occurred during the SARS-CoV-2 public health emergency.  Safety protocols were in place, including screening questions prior to the visit, additional usage of staff PPE, and extensive cleaning of exam room while observing appropriate contact time as indicated for disinfecting solutions.    F/U in 3 months    Signed electronically by: Mack Guise, MD  [facility] Endocrinology  [facility] [address] [place], [place] [address] Phone: [phone]  FAX: [phone]   CC: [address], [place] [address] Phone: [phone]  Fax: [phone]  Return to Endocrinology clinic as below: Future Appointments  Date Time Provider [place]  09/17/2019  9:30 AM Ethne Jeon, Melanie Crazier, MD [facility] None

## 2019-06-23 ENCOUNTER — Encounter: Payer: Self-pay | Admitting: Podiatry

## 2019-06-23 ENCOUNTER — Ambulatory Visit (INDEPENDENT_AMBULATORY_CARE_PROVIDER_SITE_OTHER): Payer: Medicare Other | Admitting: Podiatry

## 2019-06-23 ENCOUNTER — Other Ambulatory Visit: Payer: Self-pay

## 2019-06-23 DIAGNOSIS — Q828 Other specified congenital malformations of skin: Secondary | ICD-10-CM | POA: Diagnosis not present

## 2019-06-23 DIAGNOSIS — L84 Corns and callosities: Secondary | ICD-10-CM

## 2019-06-23 DIAGNOSIS — Z794 Long term (current) use of insulin: Secondary | ICD-10-CM

## 2019-06-23 DIAGNOSIS — E1165 Type 2 diabetes mellitus with hyperglycemia: Secondary | ICD-10-CM | POA: Diagnosis not present

## 2019-06-23 NOTE — Progress Notes (Signed)
This patient presents to the office for continued treatment of her painful callus both feet.  She says the calluses are painful walking and wearing her shoes.  She says the callus on the bottom of her right forefoot and her pinch callus right big toe are the most painful.   she says she is not interested in any surgical correction.  She presents the office today for preventative foot care services.  General Appearance  Alert, conversant and in no acute stress.  Vascular  Dorsalis pedis and posterior tibial  pulses are palpable  bilaterally.  Capillary return is within normal limits  bilaterally. Temperature is within normal limits  bilaterally.  Neurologic  Senn-Weinstein monofilament wire test within normal limits  bilaterally. Muscle power within normal limits bilaterally.  Nails Normal nails noted with no evidence of bacterial or fungal infection.  Orthopedic  No limitations of motion of motion feet .  No crepitus or effusions noted.  Hammer toes 2-5  B/L.  Skin  normotropic skin  noted bilaterally.  No signs of infections or ulcers noted.  [place] sub 3 right foot.  [place] durum  fifth toes  B/l.  Pinch callus right hallux.  Callus  B/L  Debridement of callus  B/l.  RTC prn.   Gardiner Barefoot DPM.

## 2019-07-25 ENCOUNTER — Ambulatory Visit: Payer: Medicare Other

## 2019-08-02 ENCOUNTER — Ambulatory Visit: Payer: Medicare Other | Attending: Internal Medicine

## 2019-08-02 DIAGNOSIS — Z23 Encounter for immunization: Secondary | ICD-10-CM | POA: Insufficient documentation

## 2019-08-02 NOTE — Progress Notes (Signed)
   Covid-19 Vaccination Clinic  Name:  Kristina Long    MRN: [id number] DOB: [birth year]  08/02/2019  Kristina Long was observed post Covid-19 immunization for 15 minutes without incidence. She was provided with Vaccine Information Sheet and instruction to access the V-Safe system.   Kristina Long was instructed to call 911 with any severe reactions post vaccine: Marland Kitchen Difficulty breathing  . Swelling of your face and throat  . A fast heartbeat  . A bad rash all over your body  . Dizziness and weakness    Immunizations Administered    Name Date Dose VIS Date Route   Pfizer COVID-19 Vaccine 08/02/2019  8:16 AM 0.3 mL 06/06/2019 Intramuscular   Manufacturer: [employer], [employer]   Lot: [id number]   NDC: [address]-7

## 2019-08-26 ENCOUNTER — Ambulatory Visit: Payer: Medicare Other | Attending: Internal Medicine

## 2019-08-26 ENCOUNTER — Ambulatory Visit: Payer: Medicare Other

## 2019-08-26 DIAGNOSIS — Z23 Encounter for immunization: Secondary | ICD-10-CM | POA: Insufficient documentation

## 2019-08-26 NOTE — Progress Notes (Signed)
   Covid-19 Vaccination Clinic  Name:  Kristina Long    MRN: [id number] DOB: 1955-10-13  08/26/2019  [name] was observed post Covid-19 immunization for 15 minutes without incident. She was provided with Vaccine Information Sheet and instruction to access the V-Safe system.   [name] was instructed to call 911 with any severe reactions post vaccine: Marland Kitchen Difficulty breathing  . Swelling of face and throat  . A fast heartbeat  . A bad rash all over body  . Dizziness and weakness   Immunizations Administered    Name Date Dose VIS Date Route   Pfizer COVID-19 Vaccine 08/26/2019  3:54 PM 0.3 mL 06/06/2019 Intramuscular   Manufacturer: [employer], [employer]   Lot: [id number]   NDC: [address]-0

## 2019-09-12 ENCOUNTER — Telehealth: Payer: Self-pay | Admitting: Podiatry

## 2019-09-12 NOTE — Telephone Encounter (Signed)
Yes, please call me at [phone]. Thank you.

## 2019-09-15 ENCOUNTER — Other Ambulatory Visit: Payer: Self-pay

## 2019-09-17 ENCOUNTER — Ambulatory Visit (INDEPENDENT_AMBULATORY_CARE_PROVIDER_SITE_OTHER): Payer: Medicare Other | Admitting: Internal Medicine

## 2019-09-17 ENCOUNTER — Other Ambulatory Visit: Payer: Self-pay

## 2019-09-17 ENCOUNTER — Encounter: Payer: Self-pay | Admitting: Internal Medicine

## 2019-09-17 ENCOUNTER — Ambulatory Visit: Payer: Medicare Other | Admitting: Internal Medicine

## 2019-09-17 VITALS — BP 144/84 | HR 85 | Temp 98.6°F | Ht 70.0 in | Wt 203.0 lb

## 2019-09-17 DIAGNOSIS — E1165 Type 2 diabetes mellitus with hyperglycemia: Secondary | ICD-10-CM | POA: Diagnosis not present

## 2019-09-17 DIAGNOSIS — Z794 Long term (current) use of insulin: Secondary | ICD-10-CM | POA: Diagnosis not present

## 2019-09-17 LAB — POCT GLYCOSYLATED HEMOGLOBIN (HGB A1C): Hemoglobin A1C: 7.8 % — AB (ref 4.0–5.6)

## 2019-09-17 NOTE — Patient Instructions (Signed)
-    Metformin 500 mg, TWO tablets with BREAKFAST  and TWO tablet with SUPPER -  Glipizide 5 mg, ONE tablet  before breakfast    - Check sugar before breakfast and supper     - HOW TO TREAT LOW BLOOD SUGARS (Blood sugar LESS THAN 70 MG/DL)  Please follow the RULE OF 15 for the treatment of hypoglycemia treatment (when your (blood sugars are less than 70 mg/dL)    STEP 1: Take 15 grams of carbohydrates when your blood sugar is low, which includes:   3-4 GLUCOSE TABS  OR  3-4 OZ OF JUICE OR REGULAR SODA OR  ONE TUBE OF GLUCOSE GEL     STEP 2: RECHECK blood sugar in 15 MINUTES STEP 3: If your blood sugar is still low at the 15 minute recheck --> then, go back to STEP 1 and treat AGAIN with another 15 grams of carbohydrates.

## 2019-09-17 NOTE — Progress Notes (Signed)
Name: Kristina Long  Age/ Sex: 64 y.o., female   MRN/ DOB: [id number], 1956-01-10     PCP: Aretta Nip, MD   Reason for Endocrinology Evaluation: Type 2 Diabetes Mellitus  Initial Endocrine Consultative Visit: 04/11/2018    PATIENT IDENTIFIER: Kristina Long is a 64 y.o. female with a past medical history of T2DM, Seasonal allergies and OSA. The patient has followed with Endocrinology clinic since 04/12/2019 for consultative assistance with management of her diabetes.  DIABETIC HISTORY:  Kristina Long was diagnosed with DM in 2013. She has been on Metformin since her diagnosis. She was prescribed Pioglitazone by her PCP in the past  but she never took it after she read the cardiac side effects. Her hemoglobin A1c has ranged from  in 7.3% in 2018, peaking at 10.5 % in 2013.  Januvia was cost prohibitive and by 04/2019 was replaced by Glipizide.   SUBJECTIVE:   During the last visit (05/16/2019): A1c 7.0% We continued metformin and Glipizide     Today (09/17/2019): Kristina Long is here for a follow up on diabetes management.She has not been here in a year. In the interim, Januvia has been switched to Trajenta, but once she became on medicare the price became cost prohibitive.  She checks her blood sugars rarely.  The patient has not had hypoglycemic episodes since the last clinic visit. Otherwise, the patient has not required any recent emergency interventions for hypoglycemia and has not had recent hospitalizations secondary to hyper or hypoglycemic episodes.       ROS: As per HPI and as detailed below: Review of Systems  HENT: Negative for congestion and sore throat.   Gastrointestinal: Negative for diarrhea and nausea.  Genitourinary: Positive for frequency.  Neurological: Negative for seizures.  Endo/Heme/Allergies: Negative for polydipsia.      HOME DIABETES REGIMEN:  Metformin 500 mg 2 tabs BID - Taking 2 tabs daily  Glipizide 5 mg daily - not taking  on regular basis     METER DOWNLOAD SUMMARY: 3/11-3/24/2021 3 readings only  161, 175, 168 mg/dL     HISTORY:  Past Medical History:  Past Medical History:  Diagnosis Date  . Allergy   . Diabetes mellitus without complication [facility])     Past Surgical History: No past surgical history on file.  Social History:  reports that she has never smoked. She has never used smokeless tobacco. She reports that she does not drink alcohol or use drugs. Family History:  Family History  Problem Relation Age of Onset  . Diabetes Father   . Diabetes Maternal Grandmother      HOME MEDICATIONS: Allergies as of 09/17/2019   No Known Allergies     Medication List       Accurate as of September 17, 2019  9:47 AM. If you have any questions, ask your nurse or doctor.        calcium carbonate 600 MG tablet Commonly known as: OS-CAL Take 600 mg by mouth daily.   glipiZIDE 5 MG tablet Commonly known as: GLUCOTROL Take 1 tablet (5 mg total) by mouth daily before breakfast.   loratadine 10 MG tablet Commonly known as: CLARITIN Take 10 mg by mouth daily.   metFORMIN 500 MG tablet Commonly known as: GLUCOPHAGE Take 2 tablets (1,000 mg total) by mouth 2 (two) times daily. What changed: additional instructions   OneTouch Verio test strip Generic drug: glucose blood Use as instructed   sitaGLIPtin 100 MG tablet Commonly known as: JANUVIA  Take 100 mg by mouth daily.        OBJECTIVE:   Vital Signs: BP (!) 144/84 (BP Location: Left Arm, Patient Position: Sitting, Cuff Size: Normal)   Pulse 85   Temp 98.6 F (37 C)   Ht 5\' 10"  (1.778 m)   Wt 203 lb (92.1 kg)   LMP  (LMP Unknown)   SpO2 96%   BMI 29.13 kg/m   Wt Readings from Last 3 Encounters:  09/17/19 203 lb (92.1 kg)  05/16/19 204 lb 12.8 oz (92.9 kg)  04/11/18 214 lb (97.1 kg)     Exam: General: Pt appears well and is in NAD  Lungs: Clear with good BS bilat with no rales, rhonchi, or wheezes  Heart: RRR with normal     Extremities: No pretibial edema.  Skin: Normal texture and temperature to palpation.  Neuro: MS is good with appropriate affect, pt is alert and Ox3    DM foot exam: 05/16/19     The skin of the feet is without sores or ulcerations, but with multiple callous formation  The pedal pulses are 2+ on right and 2+ on left. The sensation is intact to a screening 5.07, 10 gram monofilament bilaterally    DATA REVIEWED:  A1c 7.7 03/31/2019  ASSESSMENT / PLAN / RECOMMENDATIONS:   1) Type 2 Diabetes Mellitus, sub-optimally controlled, without  complications - Most recent A1c of 7.8 %. Goal A1c < 7.0 %.     -Worsening glycemic control due to medication nonadherence. -Somehow she has been taking less metformin than discussed on the last visit by 50%. -I have encouraged her to increase the Metformin as prescribed on the last visit, and to start taking glipizide on a regular basis with breakfast. - DPP-4 inhibitors are too costly.  -No changes will be made today    MEDICATIONS: - Metformin 500 mg 2 tablets twice daily - Glipizide 5 mg before breakfast    EDUCATION / INSTRUCTIONS:  BG monitoring instructions: Patient is instructed to check her blood sugars 2 times a day, fasting and supper time.  Call [facility] if: BG persistently < 70 or > 300. . I reviewed the Rule of 15 for the treatment of hypoglycemia in detail with the patient. Literature supplied.    F/U in 4 months    Signed electronically by: 05/31/2019, MD  [facility] Endocrinology  [facility] [address] [place] [place], [place] Phone: [phone] FAX: [phone]   CC: [phone], MD [address] [place] Phone: [phone]  Fax: [phone]  Return to Endocrinology clinic as below: No future appointments.

## 2019-12-26 ENCOUNTER — Ambulatory Visit: Payer: Medicare Other | Admitting: Podiatry

## 2020-01-09 ENCOUNTER — Ambulatory Visit: Payer: Medicare Other | Admitting: Podiatry

## 2020-01-19 ENCOUNTER — Ambulatory Visit: Payer: Medicare Other | Admitting: Podiatry

## 2020-01-23 ENCOUNTER — Ambulatory Visit: Payer: Medicare Other | Admitting: Internal Medicine

## 2020-01-26 ENCOUNTER — Encounter: Payer: Self-pay | Admitting: Podiatry

## 2020-01-26 ENCOUNTER — Other Ambulatory Visit: Payer: Self-pay

## 2020-01-26 ENCOUNTER — Ambulatory Visit (INDEPENDENT_AMBULATORY_CARE_PROVIDER_SITE_OTHER): Payer: Medicare Other | Admitting: Podiatry

## 2020-01-26 DIAGNOSIS — E1140 Type 2 diabetes mellitus with diabetic neuropathy, unspecified: Secondary | ICD-10-CM

## 2020-01-26 DIAGNOSIS — Q828 Other specified congenital malformations of skin: Secondary | ICD-10-CM | POA: Diagnosis not present

## 2020-01-26 DIAGNOSIS — E1149 Type 2 diabetes mellitus with other diabetic neurological complication: Secondary | ICD-10-CM

## 2020-01-26 NOTE — Progress Notes (Signed)
Subjective:   Patient ID: Kristina Long, female   DOB: 64 y.o.   MRN: [id number]   HPI Patient states she has long-term lesions on both feet which get tender and make it hard for her to walk   ROS      Objective:  Physical Exam  Neurovascular status intact with thick keratotic lesions bilateral with patient with history of diabetic neuropathy     Assessment:  Chronic lesions with neuropathy     Plan:  Debridement of both feet no iatrogenic bleeding and reappoint for routine care

## 2020-02-27 ENCOUNTER — Other Ambulatory Visit: Payer: Self-pay

## 2020-02-27 ENCOUNTER — Ambulatory Visit (INDEPENDENT_AMBULATORY_CARE_PROVIDER_SITE_OTHER): Payer: Medicare Other | Admitting: Internal Medicine

## 2020-02-27 VITALS — BP 134/84 | HR 89 | Ht 70.0 in | Wt 201.0 lb

## 2020-02-27 DIAGNOSIS — Z794 Long term (current) use of insulin: Secondary | ICD-10-CM

## 2020-02-27 DIAGNOSIS — E1165 Type 2 diabetes mellitus with hyperglycemia: Secondary | ICD-10-CM | POA: Diagnosis not present

## 2020-02-27 LAB — POCT GLYCOSYLATED HEMOGLOBIN (HGB A1C): Hemoglobin A1C: 7.3 % — AB (ref 4.0–5.6)

## 2020-02-27 NOTE — Patient Instructions (Signed)
-    Metformin 500 mg, TWO tablets with BREAKFAST  and TWO tablet with SUPPER -  Glipizide 5 mg, ONE tablet  before breakfast        - HOW TO TREAT LOW BLOOD SUGARS (Blood sugar LESS THAN 70 MG/DL)  Please follow the RULE OF 15 for the treatment of hypoglycemia treatment (when your (blood sugars are less than 70 mg/dL)    STEP 1: Take 15 grams of carbohydrates when your blood sugar is low, which includes:   3-4 GLUCOSE TABS  OR  3-4 OZ OF JUICE OR REGULAR SODA OR  ONE TUBE OF GLUCOSE GEL     STEP 2: RECHECK blood sugar in 15 MINUTES STEP 3: If your blood sugar is still low at the 15 minute recheck --> then, go back to STEP 1 and treat AGAIN with another 15 grams of carbohydrates.

## 2020-02-27 NOTE — Progress Notes (Signed)
Name: Kristina Long  Age/ Sex: 64 y.o., female   MRN/ DOB: [id number], May 02, 1956     PCP: Clayborn Heron, MD   Reason for Endocrinology Evaluation: Type 2 Diabetes Mellitus  Initial Endocrine Consultative Visit: 04/11/2018    PATIENT IDENTIFIER: Kristina Long is a 63 y.o. female with a past medical history of T2DM, Seasonal allergies and OSA. The patient has followed with Endocrinology clinic since 04/12/2019 for consultative assistance with management of her diabetes.  DIABETIC HISTORY:  Kristina Long was diagnosed with DM in 2013. She has been on Metformin since her diagnosis. She was prescribed Pioglitazone by her PCP in the past  but she never took it after she read the cardiac side effects. Her hemoglobin A1c has ranged from  in 7.3% in 2018, peaking at 10.5 % in 2013.  Januvia was cost prohibitive and by 04/2019 was replaced by Glipizide.   SUBJECTIVE:   During the last visit (09/17/2019): A1c 7.8% We continued metformin and Glipizide     Today (02/27/2020): Kristina Long is here for a follow up on diabetes management.She missed her 4 month follow up in 12/2019.  The patient has not had hypoglycemic episodes since the last clinic visit.   Recently retired      HOME DIABETES REGIMEN:  Metformin 500 mg 2 tabs BID - when she remember  Glipizide 5 mg daily     METER DOWNLOAD SUMMARY: 8/21-02/27/2020 Average Number Tests/Day = 0.6 Overall Mean FS Glucose = 156   BG Ranges: Low = 133 High = 185    Hypoglycemic Events/30 Days: BG < 50 = 0 Episodes of symptomatic severe hypoglycemia = 0      HISTORY:  Past Medical History:  Past Medical History:  Diagnosis Date  . Allergy   . Diabetes mellitus without complication [facility])     Past Surgical History: No past surgical history on file.  Social History:  reports that she has never smoked. She has never used smokeless tobacco. She reports that she does not drink alcohol and does not use  drugs. Family History:  Family History  Problem Relation Age of Onset  . Diabetes Father   . Diabetes Maternal Grandmother      HOME MEDICATIONS: Allergies as of 02/27/2020   No Known Allergies     Medication List       Accurate as of February 27, 2020  4:03 PM. If you have any questions, ask your nurse or doctor.        calcium carbonate 600 MG tablet Commonly known as: OS-CAL Take 600 mg by mouth daily.   glipiZIDE 5 MG tablet Commonly known as: GLUCOTROL Take 1 tablet (5 mg total) by mouth daily before breakfast.   loratadine 10 MG tablet Commonly known as: CLARITIN Take 10 mg by mouth daily.   metFORMIN 500 MG tablet Commonly known as: GLUCOPHAGE Take 2 tablets (1,000 mg total) by mouth 2 (two) times daily. What changed: additional instructions   OneTouch Verio test strip Generic drug: glucose blood Use as instructed   sitaGLIPtin 100 MG tablet Commonly known as: JANUVIA Take 100 mg by mouth daily.        OBJECTIVE:   Vital Signs: BP 134/84 (BP Location: Left Arm, Patient Position: Sitting, Cuff Size: Normal)   Pulse 89   Ht 5\' 10"  (1.778 m)   Wt 201 lb (91.2 kg)   LMP  (LMP Unknown)   SpO2 96%   BMI 28.84  kg/m   Wt Readings from Last 3 Encounters:  02/27/20 201 lb (91.2 kg)  09/17/19 203 lb (92.1 kg)  05/16/19 204 lb 12.8 oz (92.9 kg)     Exam: General: Pt appears well and is in NAD  Lungs: Clear with good BS bilat with no rales, rhonchi, or wheezes  Heart: RRR with normal    Extremities: No pretibial edema.  Skin: Normal texture and temperature to palpation.  Neuro: MS is good with appropriate affect, pt is alert and Ox3    DM foot exam:  02/27/2020     The skin of the feet is without sores or ulcerations, but with multiple callous formation  The pedal pulses are 2+ on right and 2+ on left. The sensation is intact to a screening 5.07, 10 gram monofilament bilaterally    DATA REVIEWED: Results for Kristina, Long (MRN [id number])  as of 03/01/2020 14:39  Ref. Range 02/27/2020 15:58  Hemoglobin A1C Latest Ref Range: 4.0 - 5.6 % 7.3 (A)    ASSESSMENT / PLAN / RECOMMENDATIONS:   1) Type 2 Diabetes Mellitus, sub-optimally controlled, without  complications - Most recent A1c of 7.3 %. Goal A1c < 7.0 %.     - Slight improvement in glycemic control, she admits to imperfect adherence to medication intake.   - She recently retired and is motivated to pay more attention to her health.  - DPP-4 inhibitors are too costly.  -No changes will be made today    MEDICATIONS: - Metformin 500 mg 2 tablets twice daily - Glipizide 5 mg before breakfast    EDUCATION / INSTRUCTIONS:  BG monitoring instructions: Patient is instructed to check her blood sugars 2 times a day, fasting and supper time.  Call [facility] if: BG persistently < 70 or > 300. . I reviewed the Rule of 15 for the treatment of hypoglycemia in detail with the patient. Literature supplied.    F/U in 6 months    Signed electronically by: Lyndle Herrlich, MD  [facility] Endocrinology  [facility] [address] [place] [place], [place] [address] Phone: [phone] FAX: [phone]   CC: Clayborn Heron, MD [address] [place] [address] Phone: [phone]  Fax: [phone]  Return to Endocrinology clinic as below: No future appointments.

## 2020-03-01 ENCOUNTER — Encounter: Payer: Self-pay | Admitting: Internal Medicine

## 2020-04-14 ENCOUNTER — Telehealth: Payer: Self-pay | Admitting: Internal Medicine

## 2020-04-14 NOTE — Telephone Encounter (Signed)
Medication Rx Request  Name of medication? Alma Friendly - wants to switch to this from glipizide due to insurance  Is this a 90 day supply? No, 30  Name and location of pharmacy?   [facility] [address] [place], [place] [address] Phone: [phone]

## 2020-04-15 NOTE — Telephone Encounter (Signed)
Please advise 

## 2020-04-15 NOTE — Telephone Encounter (Signed)
Spoken to patient and notified Dr Harvel Ricks comments. Patient stated that will wait and discuss on her next appointment in March

## 2020-04-24 ENCOUNTER — Ambulatory Visit: Payer: Medicare Other | Attending: Internal Medicine

## 2020-04-24 ENCOUNTER — Other Ambulatory Visit: Payer: Self-pay

## 2020-04-24 DIAGNOSIS — Z23 Encounter for immunization: Secondary | ICD-10-CM

## 2020-04-24 NOTE — Progress Notes (Signed)
   Covid-19 Vaccination Clinic  Name:  Kristina Long    MRN: [id number] DOB: June 29, 1955  04/24/2020  [name] was observed post Covid-19 immunization for 15 minutes without incident. She was provided with Vaccine Information Sheet and instruction to access the V-Safe system.   [name] was instructed to call 911 with any severe reactions post vaccine: Marland Kitchen Difficulty breathing  . Swelling of face and throat  . A fast heartbeat  . A bad rash all over body  . Dizziness and weakness

## 2020-04-28 NOTE — Telephone Encounter (Addendum)
Dr Barbaraann Barthel called our office stating that she need to discuss this patient with Dr Lonzo Cloud. It is regarding patient is afraid to take glipizide and want to take Januvia.  You asked for Dr Lonzo Cloud to call Dr Barbaraann Barthel' cell phone at [phone]

## 2020-04-29 ENCOUNTER — Other Ambulatory Visit: Payer: Self-pay

## 2020-04-29 ENCOUNTER — Ambulatory Visit (INDEPENDENT_AMBULATORY_CARE_PROVIDER_SITE_OTHER): Payer: Medicare Other | Admitting: Podiatry

## 2020-04-29 ENCOUNTER — Encounter: Payer: Self-pay | Admitting: Podiatry

## 2020-04-29 DIAGNOSIS — E1165 Type 2 diabetes mellitus with hyperglycemia: Secondary | ICD-10-CM

## 2020-04-29 DIAGNOSIS — Z794 Long term (current) use of insulin: Secondary | ICD-10-CM

## 2020-04-29 DIAGNOSIS — Q828 Other specified congenital malformations of skin: Secondary | ICD-10-CM

## 2020-04-29 NOTE — Telephone Encounter (Signed)
Left a voice mail for a call back on Dr. Ephriam Knuckles provided number on 04/29/2020 at about 10 AM

## 2020-04-29 NOTE — Progress Notes (Signed)
Subjective:   Patient ID: Kristina Long, female   DOB: 64 y.o.   MRN: [id number]   HPI Patient presents with 3 painful lesions on both feet and on the third and fifth digits right   ROS      Objective:  Physical Exam  Neurovascular status intact with significant plantar keratotic lesion formation bilateral lesions on the hallux bilateral and lesions on the third and fifth digits right painful     Assessment:  Chronic lesion formation secondary to digital and foot structure     Plan:  Sharp sterile debridement accomplished no iatrogenic bleeding and this will be repeated as needed

## 2020-05-02 ENCOUNTER — Telehealth: Payer: Self-pay | Admitting: Internal Medicine

## 2020-05-02 ENCOUNTER — Encounter: Payer: Self-pay | Admitting: Internal Medicine

## 2020-05-02 MED ORDER — SITAGLIPTIN PHOSPHATE 100 MG PO TABS: 100.0000 mg | ORAL_TABLET | Freq: Every day | ORAL | 1 refills | Status: DC

## 2020-05-02 NOTE — Telephone Encounter (Signed)
Spoke to Dr. Barbaraann Barthel on Friday 04/30/2020 discussed pt concerns about Glipizide and would prefer to go back on Januvia as apparently this is covered by her insurance now.     Prescription sent.    Abby Raelyn Mora, MD  [facility] Endocrinology  [facility] [address] [place] [place], [place] [address] Phone: [phone] FAX: [phone]

## 2020-05-03 NOTE — Telephone Encounter (Signed)
This has been addressed in a message through [employer]

## 2020-06-09 ENCOUNTER — Other Ambulatory Visit: Payer: Self-pay | Admitting: Internal Medicine

## 2020-07-01 ENCOUNTER — Other Ambulatory Visit: Payer: Self-pay | Admitting: Internal Medicine

## 2020-07-01 DIAGNOSIS — Z794 Long term (current) use of insulin: Secondary | ICD-10-CM

## 2020-07-01 DIAGNOSIS — E1165 Type 2 diabetes mellitus with hyperglycemia: Secondary | ICD-10-CM

## 2020-07-02 ENCOUNTER — Other Ambulatory Visit: Payer: Self-pay | Admitting: *Deleted

## 2020-07-02 ENCOUNTER — Telehealth: Payer: Self-pay | Admitting: Internal Medicine

## 2020-07-02 MED ORDER — ONETOUCH DELICA LANCETS 33G MISC: 3 refills | Status: DC

## 2020-07-02 NOTE — Telephone Encounter (Signed)
Rx sent 

## 2020-07-02 NOTE — Telephone Encounter (Signed)
Patient called and requested a refill for lancets to [facility] [address] in [place]

## 2020-08-04 ENCOUNTER — Ambulatory Visit: Payer: Medicare Other | Admitting: Podiatry

## 2020-08-06 ENCOUNTER — Encounter: Payer: Self-pay | Admitting: Podiatry

## 2020-08-06 ENCOUNTER — Ambulatory Visit (INDEPENDENT_AMBULATORY_CARE_PROVIDER_SITE_OTHER): Payer: Medicare Other | Admitting: Podiatry

## 2020-08-06 ENCOUNTER — Other Ambulatory Visit: Payer: Self-pay

## 2020-08-06 DIAGNOSIS — E1140 Type 2 diabetes mellitus with diabetic neuropathy, unspecified: Secondary | ICD-10-CM

## 2020-08-06 DIAGNOSIS — Q828 Other specified congenital malformations of skin: Secondary | ICD-10-CM

## 2020-08-06 DIAGNOSIS — E1149 Type 2 diabetes mellitus with other diabetic neurological complication: Secondary | ICD-10-CM

## 2020-08-09 NOTE — Progress Notes (Signed)
Subjective:   Patient ID: Kristina Long, female   DOB: 65 y.o.   MRN: [id number]   HPI Patient presents with chronic lesions with long-term diabetic neuropathy   ROS      Objective:  Physical Exam  Neurovascular status intact with lesion formation bilateral      Assessment:  Chronic lesion formation bilateral     Plan:  Debrided lesions bilateral no iatrogenic bleeding noted reappoint routine care

## 2020-08-17 ENCOUNTER — Other Ambulatory Visit: Payer: Self-pay | Admitting: Internal Medicine

## 2020-08-17 MED ORDER — METFORMIN HCL 500 MG PO TABS: 1000.0000 mg | ORAL_TABLET | Freq: Two times a day (BID) | ORAL | 1 refills | Status: DC

## 2020-08-29 ENCOUNTER — Encounter (HOSPITAL_BASED_OUTPATIENT_CLINIC_OR_DEPARTMENT_OTHER): Payer: Self-pay | Admitting: Emergency Medicine

## 2020-08-29 ENCOUNTER — Emergency Department (HOSPITAL_BASED_OUTPATIENT_CLINIC_OR_DEPARTMENT_OTHER)
Admission: EM | Admit: 2020-08-29 | Discharge: 2020-08-29 | Disposition: A | Discharge status: Home / Self Care | Payer: Medicare Other | Attending: Emergency Medicine | Admitting: Emergency Medicine

## 2020-08-29 ENCOUNTER — Emergency Department (HOSPITAL_BASED_OUTPATIENT_CLINIC_OR_DEPARTMENT_OTHER): Payer: Medicare Other

## 2020-08-29 ENCOUNTER — Other Ambulatory Visit: Payer: Self-pay

## 2020-08-29 DIAGNOSIS — E119 Type 2 diabetes mellitus without complications: Secondary | ICD-10-CM | POA: Insufficient documentation

## 2020-08-29 DIAGNOSIS — Z7984 Long term (current) use of oral hypoglycemic drugs: Secondary | ICD-10-CM | POA: Insufficient documentation

## 2020-08-29 DIAGNOSIS — N390 Urinary tract infection, site not specified: Secondary | ICD-10-CM | POA: Insufficient documentation

## 2020-08-29 DIAGNOSIS — R1031 Right lower quadrant pain: Secondary | ICD-10-CM | POA: Diagnosis present

## 2020-08-29 LAB — LIPASE, BLOOD: Lipase: 32 U/L (ref 11–51)

## 2020-08-29 LAB — CBC
HCT: 39.8 % (ref 36.0–46.0)
Hemoglobin: 13 g/dL (ref 12.0–15.0)
MCH: 27.8 pg (ref 26.0–34.0)
MCHC: 32.7 g/dL (ref 30.0–36.0)
MCV: 85 fL (ref 80.0–100.0)
Platelets: 282 10*3/uL (ref 150–400)
RBC: 4.68 MIL/uL (ref 3.87–5.11)
RDW: 15 % (ref 11.5–15.5)
WBC: 7.4 10*3/uL (ref 4.0–10.5)
nRBC: 0 % (ref 0.0–0.2)

## 2020-08-29 LAB — URINALYSIS, ROUTINE W REFLEX MICROSCOPIC
Glucose, UA: NEGATIVE mg/dL
Ketones, ur: NEGATIVE mg/dL
Nitrite: POSITIVE — AB
Protein, ur: NEGATIVE mg/dL
Specific Gravity, Urine: 1.03 (ref 1.005–1.030)
pH: 6 (ref 5.0–8.0)

## 2020-08-29 LAB — COMPREHENSIVE METABOLIC PANEL
ALT: 41 U/L (ref 0–44)
AST: 29 U/L (ref 15–41)
Albumin: 4.3 g/dL (ref 3.5–5.0)
Alkaline Phosphatase: 67 U/L (ref 38–126)
Anion gap: 11 (ref 5–15)
BUN: 18 mg/dL (ref 8–23)
CO2: 24 mmol/L (ref 22–32)
Calcium: 9.7 mg/dL (ref 8.9–10.3)
Chloride: 106 mmol/L (ref 98–111)
Creatinine, Ser: 0.72 mg/dL (ref 0.44–1.00)
GFR, Estimated: >60 mL/min (ref >60)
Glucose, Bld: 102 mg/dL — ABNORMAL HIGH (ref 70–99)
Potassium: 3.9 mmol/L (ref 3.5–5.1)
Sodium: 141 mmol/L (ref 135–145)
Total Bilirubin: 0.3 mg/dL (ref 0.3–1.2)
Total Protein: 7.6 g/dL (ref 6.5–8.1)

## 2020-08-29 LAB — URINALYSIS, MICROSCOPIC (REFLEX)

## 2020-08-29 MED ORDER — CEPHALEXIN 500 MG PO CAPS: 500.0000 mg | ORAL_CAPSULE | Freq: Four times a day (QID) | ORAL | 0 refills | Status: AC

## 2020-08-29 MED ORDER — KETOROLAC TROMETHAMINE 15 MG/ML IJ SOLN
15.0000 mg | Freq: Once | INTRAMUSCULAR | Status: AC
  Administered 2020-08-29: 15 mg via INTRAVENOUS
  Filled 2020-08-29: qty 1

## 2020-08-29 MED ORDER — SODIUM CHLORIDE 0.9 % IV SOLN
1.0000 g | Freq: Once | INTRAVENOUS | Status: AC
  Administered 2020-08-29: 1 g via INTRAVENOUS
  Filled 2020-08-29: qty 10

## 2020-08-29 MED ORDER — IOHEXOL 300 MG/ML  SOLN
100.0000 mL | Freq: Once | INTRAMUSCULAR | Status: AC | PRN
  Administered 2020-08-29: 100 mL via INTRAVENOUS

## 2020-08-29 NOTE — ED Notes (Signed)
See EDP assessment 

## 2020-08-29 NOTE — Discharge Instructions (Addendum)
Take antibiotics as prescribed.  Take entire course, even if your symptoms improve. Make sure you are staying well-hydrated with water. Use Tylenol or ibuprofen as needed for pain. Follow with your primary care doctor if your symptoms not improving after several days. Return to the emergency room with any new, worsening, concerning symptoms

## 2020-08-29 NOTE — ED Provider Notes (Signed)
[facility] EMERGENCY DEPARTMENT Provider Note   CSN: [id number] Arrival date & time: 08/29/20  1447     History Chief Complaint  Patient presents with   Abdominal Pain    Kristina Long is a 65 y.o. female presenting for evaluation of abdominal pain.  Patient states for the past 4 days she has had intermittent pain of her right lower quadrant which radiates to her right leg.  It is worse when she stands and walks.  This occurred when she first got out of the recliner.  No pain at rest.  No associated fevers, chills, nausea, vomiting.  No change in bowel movements.  She is having urinary frequency, but no dysuria or hematuria.  No previous history of GI problems including appendicitis.  She saw her PCP who was concerned for possible appendicitis, recommend she come to ER for evaluation.  She has not taken anything for her symptoms including, ibuprofen.  P.o. intake does not change her pain   HPI     Past Medical History:  Diagnosis Date   Allergy    Diabetes mellitus without complication [facility])     Patient Active Problem List   Diagnosis Date Noted   Type 2 diabetes mellitus with hyperglycemia, with long-term current use of insulin (HCC) 05/16/2019    History reviewed. No pertinent surgical history.   OB History   No obstetric history on file.     Family History  Problem Relation Age of Onset   Diabetes Father    Diabetes Maternal Grandmother     Social History   Tobacco Use   Smoking status: Never Smoker   Smokeless tobacco: Never Used  Vaping Use   Vaping Use: Never used  Substance Use Topics   Alcohol use: No   Drug use: No    Home Medications Prior to Admission medications   Medication Sig Start Date End Date Taking? Authorizing Provider  cephALEXin (KEFLEX) 500 MG capsule Take 1 capsule (500 mg total) by mouth 4 (four) times daily for 7 days. 08/29/20 09/05/20 Yes Lizett Chowning, PA-C  calcium carbonate (OS-CAL) 600 MG tablet Take  600 mg by mouth daily.    [provider]  glucose blood (ONETOUCH VERIO) test strip Use as instructed to test blood sugar 2 times a day 07/01/20   Shamleffer, Konrad Dolores, MD  loratadine (CLARITIN) 10 MG tablet Take 10 mg by mouth daily.    [provider]  metFORMIN (GLUCOPHAGE) 500 MG tablet Take 2 tablets (1,000 mg total) by mouth 2 (two) times daily. 2  daily 08/17/20   Shamleffer, Konrad Dolores, MD  OneTouch Delica Lancets 33G MISC Use as instructed to check blood sugar 2 times per day 07/02/20   Shamleffer, Konrad Dolores, MD  sitaGLIPtin (JANUVIA) 100 MG tablet Take 1 tablet (100 mg total) by mouth daily. 05/02/20   Shamleffer, Konrad Dolores, MD    Allergies    Canagliflozin, Empagliflozin, and Other  Review of Systems   Review of Systems  Gastrointestinal: Positive for abdominal pain.  All other systems reviewed and are negative.   Physical Exam Updated Vital Signs BP (!) 155/82 (BP Location: Right Arm)    Pulse 81    Temp 98.3 F (36.8 C) (Oral)    Resp 16    Ht 5\' 10"  (1.778 m)    Wt 92.1 kg    LMP  (LMP Unknown)    SpO2 99%    BMI 29.13 kg/m   Physical Exam Vitals and nursing note reviewed.  Constitutional:      General: She is not in acute distress.    Appearance: She is well-developed and well-nourished.     Comments: Resting in the bed in NAD  HENT:     Head: Normocephalic and atraumatic.  Eyes:     Extraocular Movements: Extraocular movements intact and EOM normal.     Conjunctiva/sclera: Conjunctivae normal.     Pupils: Pupils are equal, round, and reactive to light.  Cardiovascular:     Rate and Rhythm: Normal rate and regular rhythm.     Pulses: Normal pulses and intact distal pulses.  Pulmonary:     Effort: Pulmonary effort is normal. No respiratory distress.     Breath sounds: Normal breath sounds. No wheezing.  Abdominal:     General: There is no distension.     Palpations: Abdomen is soft. There is no mass.     Tenderness: There  is abdominal tenderness. There is no guarding or rebound.     Comments: Tenderness palpation of right lower quadrant abdomen.  No rigidity, guarding, distention.  Negative rebound.  Musculoskeletal:        General: No tenderness. Normal range of motion.     Cervical back: Normal range of motion and neck supple.     Comments: No tenderness palpation of the right upper leg.  Skin:    General: Skin is warm and dry.     Capillary Refill: Capillary refill takes less than 2 seconds.  Neurological:     Mental Status: She is alert and oriented to person, place, and time.  Psychiatric:        Mood and Affect: Mood and affect normal.     ED Results / Procedures / Treatments   Labs (all labs ordered are listed, but only abnormal results are displayed) Labs Reviewed  COMPREHENSIVE METABOLIC PANEL - Abnormal; Notable for the following components:      Result Value   Glucose, Bld 102 (*)    All other components within normal limits  URINALYSIS, ROUTINE W REFLEX MICROSCOPIC - Abnormal; Notable for the following components:   APPearance HAZY (*)    Hgb urine dipstick SMALL (*)    Bilirubin Urine MODERATE (*)    Nitrite POSITIVE (*)    Leukocytes,Ua SMALL (*)    All other components within normal limits  URINALYSIS, MICROSCOPIC (REFLEX) - Abnormal; Notable for the following components:   Bacteria, UA MANY (*)    All other components within normal limits  LIPASE, BLOOD  CBC    EKG None  Radiology CT ABDOMEN PELVIS W CONTRAST  Result Date: 08/29/2020 CLINICAL DATA:  Right lower quadrant abdominal pain. EXAM: CT ABDOMEN AND PELVIS WITH CONTRAST TECHNIQUE: Multidetector CT imaging of the abdomen and pelvis was performed using the standard protocol following bolus administration of intravenous contrast. CONTRAST:  OMNIPAQUE IOHEXOL 300 MG/ML  SOLN COMPARISON:  None. FINDINGS: Lower chest: Lung bases are clear. No focal airspace disease or pleural effusion. Hepatobiliary: Enlarged liver  spanning 18.1 cm cranial caudal. Diffusely decreased hepatic density typically steatosis. No focal liver lesion. Decompressed gallbladder. No calcified gallstone or pericholecystic fat stranding. No biliary dilatation. Pancreas: No ductal dilatation or inflammation. Spleen: Normal in size without focal abnormality. Adrenals/Urinary Tract: Normal adrenal glands. No hydronephrosis or perinephric edema. Homogeneous renal enhancement with symmetric excretion on delayed phase imaging. 7 mm low-density lesion in the mid left kidney is too small to characterize. Urinary bladder is near completely empty, unremarkable. Stomach/Bowel: Normal appendix, for example series 2,  image 50. Tiny hiatal hernia. Ingested material within the stomach. No small bowel obstruction or inflammation. No terminal ileal inflammation. Moderate stool in the ascending and transverse colon. Small volume of stool distally. Occasional sigmoid colonic diverticula. No diverticulosis. No colonic wall thickening or pericolonic edema. Vascular/Lymphatic: Normal caliber abdominal aorta. Patent portal vein. No enlarged lymph nodes in the abdomen or pelvis. Reproductive: Calcified posterior fundal fibroids.  No adnexal mass. Other: No free air, free fluid, or intra-abdominal fluid collection. Small fat containing umbilical hernia. No inguinal hernia. Musculoskeletal: Facet mediated grade 1 anterolisthesis of L4 on L5 occasional bone islands in the pelvis. There are no acute or suspicious osseous abnormalities. IMPRESSION: 1. Normal appendix. No acute abnormality in the abdomen/pelvis. 2. Hepatomegaly and hepatic steatosis. 3. Calcified uterine fibroids. 4. Small fat containing umbilical hernia. Electronically Signed   By: Narda Rutherford M.D.   On: 08/29/2020 18:23    Procedures Procedures   Medications Ordered in ED Medications  ketorolac (TORADOL) 15 MG/ML injection 15 mg (has no administration in time range)  cefTRIAXone (ROCEPHIN) 1 g in sodium  chloride 0.9 % 100 mL IVPB (has no administration in time range)  iohexol (OMNIPAQUE) 300 MG/ML solution 100 mL (100 mLs Intravenous Contrast Given 08/29/20 1742)    ED Course  I have reviewed the triage vital signs and the nursing notes.  Pertinent labs & imaging results that were available during my care of the patient were reviewed by me and considered in my medical decision making (see chart for details).    MDM Rules/Calculators/A&P                          Patient presenting for evaluation of abdominal pain.  On exam, patient appears nontoxic.  She does have tenderness palpation of her right lower quadrant on exam.  However no infectious symptoms such as fevers, nausea, vomiting.  Consider atypical presentation of appendicitis.  Also consider UTI as patient has urinary frequency.  Consider MSK cause.  Less likely kidney stone.  Labs obtained in triage interpreted by me, overall reassuring.  Leukocytosis.  Electrolytes stable.  Kidney, liver, pancreatic function normal.  CT pending.  CT shows no sign of acute appendicitis.  No acute intra-abdominal abnormalities.  Urine is consistent with infection.  This may be contributing to patient's pain.  Discussed with patient.  Discussed treatment with antibiotics.  Discussed that there may also be a muscular component, treat with Tylenol and ibuprofen.  Follow-up with PCP.  At this time, patient appears safe for discharge.  Return precautions given.  Patient states he understands and agrees to plan  Final Clinical Impression(s) / ED Diagnoses Final diagnoses:  Urinary tract infection without hematuria, site unspecified  RLQ abdominal pain    Rx / DC Orders ED Discharge Orders         Ordered    cephALEXin (KEFLEX) 500 MG capsule  4 times daily        08/29/20 1933           Alveria Apley, PA-C 08/29/20 1934    Charlynne Pander, MD 08/29/20 2119

## 2020-08-29 NOTE — ED Notes (Signed)
Patient transported to CT and returned to room at this time 

## 2020-08-29 NOTE — ED Triage Notes (Signed)
Pt c/o right sided abdominal pain that radiated into right leg after getting up out of a recliner on Thursday. Pt continues to have same pain today and only occurs when ambulating. Pt seen at [facility] walk in clinic and sent to [facility] for further evaluation of acute appendicitis. Patient reports she did not know of [facility] just this facility here.

## 2020-08-30 NOTE — Progress Notes (Signed)
Name: Courtny Bennison  Age/ Sex: 65 y.o., female   MRN/ DOB: [id number], 1956-04-03     PCP: Clayborn Heron, MD   Reason for Endocrinology Evaluation: Type 2 Diabetes Mellitus  Initial Endocrine Consultative Visit: 04/11/2018    PATIENT IDENTIFIER: Ms. Toneisha Savary is a 65 y.o. female with a past medical history of T2DM, Seasonal allergies and OSA. The patient has followed with Endocrinology clinic since 04/12/2019 for consultative assistance with management of her diabetes.  DIABETIC HISTORY:  Ms. Tolliver was diagnosed with DM in 2013. She has been on Metformin since her diagnosis. She was prescribed Pioglitazone by her PCP in the past  but she never took it after she read the cardiac side effects. Her hemoglobin A1c has ranged from  in 7.3% in 2018, peaking at 10.5 % in 2013.  Januvia was cost prohibitive and by 04/2019 was replaced by Glipizide   Restarted januvia 06/2020  SUBJECTIVE:   During the last visit (02/27/2020): A1c 7.3% We continued metformin and Glipizide     Today (08/31/2020): Ms. Hanigan is here for a follow up on diabetes management.  She checks glucose once a day. The patient has not had hypoglycemic episodes since the last clinic visit.   She is enjoying her retirement Recent urgent care visit for UTI  , treated with Abx  Abdominal pain is improving No nausea or vomiting      HOME DIABETES REGIMEN:  Metformin 500 mg 2 tabs BID  Januvia 100 mg daily     METER DOWNLOAD SUMMARY: Did not bring    DIABETIC COMPLICATIONS: Microvascular complications:    Denies: Neuropathy, retinopathy , nephropthay  Last eye exam: Completed  06/2020  Macrovascular complications:    Denies: CAD, CVA, PVD   HISTORY:  Past Medical History:  Past Medical History:  Diagnosis Date  . Allergy   . Diabetes mellitus without complication [facility])     Past Surgical History: No past surgical history on file.  Social History:  reports that she has  never smoked. She has never used smokeless tobacco. She reports that she does not drink alcohol and does not use drugs. Family History:  Family History  Problem Relation Age of Onset  . Diabetes Father   . Diabetes Maternal Grandmother      HOME MEDICATIONS: Allergies as of 08/31/2020      Reactions   Canagliflozin Other (See Comments)   Empagliflozin Other (See Comments)   Other Other (See Comments)      Medication List       Accurate as of August 31, 2020 12:25 PM. If you have any questions, ask your nurse or doctor.        calcium carbonate 600 MG tablet Commonly known as: OS-CAL Take 600 mg by mouth daily.   cephALEXin 500 MG capsule Commonly known as: KEFLEX Take 1 capsule (500 mg total) by mouth 4 (four) times daily for 7 days.   loratadine 10 MG tablet Commonly known as: CLARITIN Take 10 mg by mouth daily.   metFORMIN 500 MG tablet Commonly known as: GLUCOPHAGE Take 2 tablets (1,000 mg total) by mouth 2 (two) times daily. 2  daily   OneTouch Delica Lancets 33G Misc Use as instructed to check blood sugar 2 times per day   OneTouch Verio test strip Generic drug: glucose blood Use as instructed to test blood sugar 2 times a day   sitaGLIPtin 100 MG tablet Commonly known as: Januvia  Take 1 tablet (100 mg total) by mouth daily.        OBJECTIVE:   Vital Signs: BP 134/90   Pulse 79   Ht 5\' 10"  (1.778 m)   Wt 202 lb 4 oz (91.7 kg)   LMP  (LMP Unknown)   SpO2 97%   BMI 29.02 kg/m   Wt Readings from Last 3 Encounters:  08/31/20 202 lb 4 oz (91.7 kg)  08/29/20 203 lb (92.1 kg)  02/27/20 201 lb (91.2 kg)     Exam: General: Pt appears well and is in NAD  Lungs: Clear with good BS bilat with no rales, rhonchi, or wheezes  Heart: RRR with normal    Extremities: No pretibial edema.  Skin: Normal texture and temperature to palpation.  Neuro: MS is good with appropriate affect, pt is alert and Ox3    DM foot exam:  02/27/2020     The skin of the  feet is without sores or ulcerations, but with multiple callous formation  The pedal pulses are 2+ on right and 2+ on left. The sensation is intact to a screening 5.07, 10 gram monofilament bilaterally    DATA REVIEWED: Results for LICIA, HARL (MRN Manuella Ghazi) as of 08/31/2020 12:25  Ref. Range 08/31/2020 11:28 08/31/2020 11:33  POC Glucose Latest Ref Range: 70 - 99 mg/dl 10/31/2020 (A)   Hemoglobin 629 Latest Ref Range: 4.0 - 5.6 %  6.6 (A)   ASSESSMENT / PLAN / RECOMMENDATIONS:   1) Type 2 Diabetes Mellitus, Optimally controlled, without  complications - Most recent A1c of 6.6 %. Goal A1c < 7.0 %.    - Praised the pt on optimal glucose control  - No changes will be made today    MEDICATIONS: - Metformin 500 mg 2 tablets twice daily - Januvia 100 mg daily    EDUCATION / INSTRUCTIONS:  BG monitoring instructions: Patient is instructed to check her blood sugars 1 times a day, fasting  Call [facility] if: BG persistently < 70  . I reviewed the Rule of 15 for the treatment of hypoglycemia in detail with the patient. Literature supplied.  2) Diabetic complications:   Eye: She does not have known diabetic retinopathy.    Neuro/ Feet: does not have known diabetic peripheral neuropathy.  Renal: Patient does not have known baseline CKD.    F/U in 6 months    Signed electronically by: [email or web address], MD  [facility]  [facility] [address] [place], [place] Phone: [phone] FAX: [phone]   CC: [phone], MD [address] [place] Phone: [phone]  Fax: [phone]  Return to Endocrinology clinic as below: Future Appointments  Date Time Provider Department Center  03/08/2021 11:10 AM Alecsander Hattabaugh, 03/10/2021, MD [id number]

## 2020-08-31 ENCOUNTER — Other Ambulatory Visit: Payer: Self-pay

## 2020-08-31 ENCOUNTER — Ambulatory Visit (INDEPENDENT_AMBULATORY_CARE_PROVIDER_SITE_OTHER): Payer: Medicare Other | Admitting: Internal Medicine

## 2020-08-31 ENCOUNTER — Encounter: Payer: Self-pay | Admitting: Internal Medicine

## 2020-08-31 VITALS — BP 134/90 | HR 79 | Ht 70.0 in | Wt 202.2 lb

## 2020-08-31 DIAGNOSIS — E119 Type 2 diabetes mellitus without complications: Secondary | ICD-10-CM | POA: Insufficient documentation

## 2020-08-31 DIAGNOSIS — Z794 Long term (current) use of insulin: Secondary | ICD-10-CM

## 2020-08-31 DIAGNOSIS — E1165 Type 2 diabetes mellitus with hyperglycemia: Secondary | ICD-10-CM

## 2020-08-31 LAB — POCT GLUCOSE (DEVICE FOR HOME USE): POC Glucose: 138 mg/dl — AB (ref 70–99)

## 2020-08-31 LAB — POCT GLYCOSYLATED HEMOGLOBIN (HGB A1C): Hemoglobin A1C: 6.6 % — AB (ref 4.0–5.6)

## 2020-08-31 MED ORDER — SITAGLIPTIN PHOSPHATE 100 MG PO TABS: 100.0000 mg | ORAL_TABLET | Freq: Every day | ORAL | 3 refills | Status: DC

## 2020-08-31 MED ORDER — METFORMIN HCL 500 MG PO TABS: 1000.0000 mg | ORAL_TABLET | Freq: Two times a day (BID) | ORAL | 3 refills | Status: DC

## 2020-08-31 NOTE — Patient Instructions (Signed)
-    Metformin 500 mg, TWO tablets with BREAKFAST  and TWO tablet with SUPPER -  Januvia 100 mg, 1 tablet daily

## 2020-09-14 ENCOUNTER — Encounter (HOSPITAL_BASED_OUTPATIENT_CLINIC_OR_DEPARTMENT_OTHER): Payer: Self-pay | Admitting: *Deleted

## 2020-09-14 ENCOUNTER — Other Ambulatory Visit: Payer: Self-pay

## 2020-09-14 ENCOUNTER — Emergency Department (HOSPITAL_BASED_OUTPATIENT_CLINIC_OR_DEPARTMENT_OTHER)
Admission: EM | Admit: 2020-09-14 | Discharge: 2020-09-14 | Disposition: A | Discharge status: Home / Self Care | Payer: Medicare Other | Attending: Emergency Medicine | Admitting: Emergency Medicine

## 2020-09-14 DIAGNOSIS — Z7984 Long term (current) use of oral hypoglycemic drugs: Secondary | ICD-10-CM | POA: Insufficient documentation

## 2020-09-14 DIAGNOSIS — R1031 Right lower quadrant pain: Secondary | ICD-10-CM | POA: Insufficient documentation

## 2020-09-14 DIAGNOSIS — E119 Type 2 diabetes mellitus without complications: Secondary | ICD-10-CM | POA: Insufficient documentation

## 2020-09-14 LAB — COMPREHENSIVE METABOLIC PANEL
ALT: 36 U/L (ref 0–44)
AST: 28 U/L (ref 15–41)
Albumin: 3.9 g/dL (ref 3.5–5.0)
Alkaline Phosphatase: 65 U/L (ref 38–126)
Anion gap: 9 (ref 5–15)
BUN: 19 mg/dL (ref 8–23)
CO2: 24 mmol/L (ref 22–32)
Calcium: 9 mg/dL (ref 8.9–10.3)
Chloride: 106 mmol/L (ref 98–111)
Creatinine, Ser: 0.7 mg/dL (ref 0.44–1.00)
GFR, Estimated: >60 mL/min (ref >60)
Glucose, Bld: 259 mg/dL — ABNORMAL HIGH (ref 70–99)
Potassium: 3.7 mmol/L (ref 3.5–5.1)
Sodium: 139 mmol/L (ref 135–145)
Total Bilirubin: 0.1 mg/dL — ABNORMAL LOW (ref 0.3–1.2)
Total Protein: 7 g/dL (ref 6.5–8.1)

## 2020-09-14 LAB — CBC WITH DIFFERENTIAL/PLATELET
Abs Immature Granulocytes: 0.01 10*3/uL (ref 0.00–0.07)
Basophils Absolute: 0 10*3/uL (ref 0.0–0.1)
Basophils Relative: 1 %
Eosinophils Absolute: 0.1 10*3/uL (ref 0.0–0.5)
Eosinophils Relative: 2 %
HCT: 36.7 % (ref 36.0–46.0)
Hemoglobin: 12.1 g/dL (ref 12.0–15.0)
Immature Granulocytes: 0 %
Lymphocytes Relative: 39 %
Lymphs Abs: 2.3 10*3/uL (ref 0.7–4.0)
MCH: 28 pg (ref 26.0–34.0)
MCHC: 33 g/dL (ref 30.0–36.0)
MCV: 85 fL (ref 80.0–100.0)
Monocytes Absolute: 0.4 10*3/uL (ref 0.1–1.0)
Monocytes Relative: 8 %
Neutro Abs: 2.9 10*3/uL (ref 1.7–7.7)
Neutrophils Relative %: 50 %
Platelets: 228 10*3/uL (ref 150–400)
RBC: 4.32 MIL/uL (ref 3.87–5.11)
RDW: 14.6 % (ref 11.5–15.5)
WBC: 5.7 10*3/uL (ref 4.0–10.5)
nRBC: 0 % (ref 0.0–0.2)

## 2020-09-14 LAB — URINALYSIS, ROUTINE W REFLEX MICROSCOPIC
Bilirubin Urine: NEGATIVE
Glucose, UA: 100 mg/dL — AB
Ketones, ur: NEGATIVE mg/dL
Leukocytes,Ua: NEGATIVE
Nitrite: NEGATIVE
Protein, ur: NEGATIVE mg/dL
Specific Gravity, Urine: 1.025 (ref 1.005–1.030)
pH: 6 (ref 5.0–8.0)

## 2020-09-14 LAB — URINALYSIS, MICROSCOPIC (REFLEX)

## 2020-09-14 NOTE — ED Notes (Signed)
Pt left before d/c paperwork could be reviewed. Pt checked out at [facility] per registration staff

## 2020-09-14 NOTE — Discharge Instructions (Signed)
The cause of your pain was not identified today.  You can take tylenol or ibuprofen available over the counter according to label instructions as needed for pain.

## 2020-09-14 NOTE — ED Provider Notes (Signed)
[facility] EMERGENCY DEPARTMENT Provider Note   CSN: [id number] Arrival date & time: 09/14/20  5956     History Chief Complaint  Patient presents with  . Abdominal Pain    Kristina Long is a 65 y.o. female.  The history is provided by the patient and medical records. No language interpreter was used.  Abdominal Pain  Kristina Long is a 65 y.o. female who presents to the Emergency Department complaining of abdominal pain. She presents the emergency department complaining of right lower quadrant abdominal pain that started on March 3. Symptoms started when she got up out of the recliner. She was evaluated in the emergency department on March 6 and had labs and CT scan performed at that time. She was diagnosed with a urinary tract infection and treated with a course of antibiotics. She is followed up with her PCP and was told that the urinary tract infection was resolved. She presents today for evaluation due to ongoing pain in the right lower quadrant. She states that there was no improvement with antibiotics. Pain is described as sharp in nature. It is worse when she lays down at night. It comes and goes. It does not worsen with walking. It does at times radiates down to her right thigh. She denies any fevers, cough, nausea, vomiting, dysuria. She has minimal diarrhea. She has a history of diabetes.    Past Medical History:  Diagnosis Date  . Allergy   . Diabetes mellitus without complication [facility])     Patient Active Problem List   Diagnosis Date Noted  . Type 2 diabetes mellitus without complication, without long-term current use of insulin (HCC) 08/31/2020  . Type 2 diabetes mellitus with hyperglycemia, with long-term current use of insulin (HCC) 05/16/2019    History reviewed. No pertinent surgical history.   OB History   No obstetric history on file.     Family History  Problem Relation Age of Onset  . Diabetes Father   . Diabetes Maternal Grandmother      Social History   Tobacco Use  . Smoking status: Never Smoker  . Smokeless tobacco: Never Used  Vaping Use  . Vaping Use: Never used  Substance Use Topics  . Alcohol use: No  . Drug use: No    Home Medications Prior to Admission medications   Medication Sig Start Date End Date Taking? Authorizing Provider  calcium carbonate (OS-CAL) 600 MG tablet Take 600 mg by mouth daily.    [provider]  glucose blood (ONETOUCH VERIO) test strip Use as instructed to test blood sugar 2 times a day 07/01/20   Shamleffer, Konrad Dolores, MD  loratadine (CLARITIN) 10 MG tablet Take 10 mg by mouth daily.    [provider]  metFORMIN (GLUCOPHAGE) 500 MG tablet Take 2 tablets (1,000 mg total) by mouth 2 (two) times daily. 2  daily 08/31/20   Shamleffer, Konrad Dolores, MD  OneTouch Delica Lancets 33G MISC Use as instructed to check blood sugar 2 times per day 07/02/20   Shamleffer, Konrad Dolores, MD  sitaGLIPtin (JANUVIA) 100 MG tablet Take 1 tablet (100 mg total) by mouth daily. 08/31/20   Shamleffer, Konrad Dolores, MD    Allergies    Canagliflozin, Empagliflozin, and Other  Review of Systems   Review of Systems  Gastrointestinal: Positive for abdominal pain.  All other systems reviewed and are negative.   Physical Exam Updated Vital Signs BP (!) 154/82 (BP Location: Right Arm)   Pulse 83   Temp  98.6 F (37 C) (Oral)   Resp 18   Ht 5\' 10"  (1.778 m)   Wt 92.1 kg   LMP  (LMP Unknown)   SpO2 96%   BMI 29.13 kg/m   Physical Exam Vitals and nursing note reviewed.  Constitutional:      Appearance: She is well-developed.  HENT:     Head: Normocephalic and atraumatic.  Cardiovascular:     Rate and Rhythm: Normal rate and regular rhythm.     Heart sounds: No murmur heard.   Pulmonary:     Effort: Pulmonary effort is normal. No respiratory distress.     Breath sounds: Normal breath sounds.  Abdominal:     Palpations: Abdomen is soft.     Tenderness: There is  no abdominal tenderness. There is no guarding or rebound.  Musculoskeletal:        General: No swelling or tenderness.     Comments: 2+ DP pulses bilaterally  Skin:    General: Skin is warm and dry.  Neurological:     Mental Status: She is alert and oriented to person, place, and time.  Psychiatric:        Behavior: Behavior normal.     ED Results / Procedures / Treatments   Labs (all labs ordered are listed, but only abnormal results are displayed) Labs Reviewed  URINALYSIS, ROUTINE W REFLEX MICROSCOPIC - Abnormal; Notable for the following components:      Result Value   Glucose, UA 100 (*)    Hgb urine dipstick TRACE (*)    All other components within normal limits  COMPREHENSIVE METABOLIC PANEL - Abnormal; Notable for the following components:   Glucose, Bld 259 (*)    Total Bilirubin 0.1 (*)    All other components within normal limits  URINALYSIS, MICROSCOPIC (REFLEX) - Abnormal; Notable for the following components:   Bacteria, UA RARE (*)    All other components within normal limits  CBC WITH DIFFERENTIAL/PLATELET    EKG None  Radiology No results found.  Procedures Procedures   Medications Ordered in ED Medications - No data to display  ED Course  I have reviewed the triage vital signs and the nursing notes.  Pertinent labs & imaging results that were available during my care of the patient were reviewed by me and considered in my medical decision making (see chart for details).    MDM Rules/Calculators/A&P                         patient here for evaluation of right lower quadrant abdominal pain that started on March 3 after getting up out of her recliner. She has no reproducible tenderness on examination. She was seen in the emergency department on March 6 and had negative imaging at that time but was treated for urinary tract infection. She has no current urinary symptoms and UA is not consistent with UTI. Labs are stable when compared to priors.  Presentation is not consistent with incarcerated hernia, appendicitis, torsion. Discussed with patient unclear source of symptoms. Recommend ibuprofen or Tylenol as needed for pain according to label instructions. Recommend OB/GYN follow-up, PCP follow-up.  Final Clinical Impression(s) / ED Diagnoses Final diagnoses:  Right lower quadrant abdominal pain    Rx / DC Orders ED Discharge Orders    None       02[birth year]14[birth year]1986, MD 09/14/20 [phone]

## 2020-09-14 NOTE — ED Triage Notes (Signed)
C/o rt lower abd pain on march 3,  Has been seen 2 times for same, ? Slight diarrhea, no burning w urination,  Denies dc.  No n/v   States walking improves pain  Pain increases w laying

## 2020-09-14 NOTE — ED Notes (Signed)
Pt denies dysuria

## 2020-09-16 ENCOUNTER — Other Ambulatory Visit: Payer: Self-pay | Admitting: Gastroenterology

## 2020-09-16 DIAGNOSIS — R1084 Generalized abdominal pain: Secondary | ICD-10-CM

## 2020-10-05 ENCOUNTER — Ambulatory Visit
Admission: RE | Admit: 2020-10-05 | Discharge: 2020-10-05 | Disposition: A | Discharge status: Home / Self Care | Payer: Medicare Other | Source: Ambulatory Visit | Attending: Gastroenterology | Admitting: Gastroenterology

## 2020-10-05 DIAGNOSIS — R1084 Generalized abdominal pain: Secondary | ICD-10-CM

## 2020-10-20 ENCOUNTER — Telehealth: Payer: Self-pay | Admitting: Internal Medicine

## 2020-10-20 NOTE — Telephone Encounter (Signed)
Patient is requesting via her PCP office a change to extended release Metformin due to stomach upset.  [facility] in [place]

## 2020-10-21 ENCOUNTER — Other Ambulatory Visit: Payer: Self-pay | Admitting: Internal Medicine

## 2020-10-21 MED ORDER — METFORMIN HCL ER 500 MG PO TB24: 1000.0000 mg | ORAL_TABLET | Freq: Two times a day (BID) | ORAL | 3 refills | Status: DC

## 2020-10-21 NOTE — Telephone Encounter (Signed)
Patient have been notified.  

## 2020-10-21 NOTE — Telephone Encounter (Signed)
Please advise 

## 2020-10-21 NOTE — Telephone Encounter (Signed)
done

## 2020-11-04 ENCOUNTER — Ambulatory Visit (INDEPENDENT_AMBULATORY_CARE_PROVIDER_SITE_OTHER): Payer: Medicare Other | Admitting: Podiatry

## 2020-11-04 ENCOUNTER — Other Ambulatory Visit: Payer: Self-pay

## 2020-11-04 ENCOUNTER — Encounter: Payer: Self-pay | Admitting: Podiatry

## 2020-11-04 DIAGNOSIS — Q828 Other specified congenital malformations of skin: Secondary | ICD-10-CM | POA: Diagnosis not present

## 2020-11-04 DIAGNOSIS — E1140 Type 2 diabetes mellitus with diabetic neuropathy, unspecified: Secondary | ICD-10-CM | POA: Diagnosis not present

## 2020-11-04 DIAGNOSIS — E1149 Type 2 diabetes mellitus with other diabetic neurological complication: Secondary | ICD-10-CM | POA: Diagnosis not present

## 2020-11-04 NOTE — Progress Notes (Signed)
Subjective:   Patient ID: Kristina Long, female   DOB: 65 y.o.   MRN: [id number]   HPI Patient presents stating she has severe chronic lesions on both feet that are sore and she is a long-term diabetic with diminished sensation   ROS      Objective:  Physical Exam  Neurovascular status unchanged with significant neuropathic-like changes with keratotic lesion fifth digits fifth metatarsals bilateral third metatarsal bilateral painful     Assessment:  Chronic lesion formation secondary to foot structure with diabetic neuropathy is complicating factor     Plan:  H&P reviewed condition sharp sterile debridement of lesions accomplished no iatrogenic bleeding reappoint routine care

## 2021-01-24 ENCOUNTER — Ambulatory Visit (INDEPENDENT_AMBULATORY_CARE_PROVIDER_SITE_OTHER): Payer: Medicare Other | Admitting: Podiatry

## 2021-01-24 ENCOUNTER — Encounter: Payer: Self-pay | Admitting: Podiatry

## 2021-01-24 ENCOUNTER — Other Ambulatory Visit: Payer: Self-pay

## 2021-01-24 DIAGNOSIS — Q828 Other specified congenital malformations of skin: Secondary | ICD-10-CM

## 2021-01-24 DIAGNOSIS — E1140 Type 2 diabetes mellitus with diabetic neuropathy, unspecified: Secondary | ICD-10-CM | POA: Diagnosis not present

## 2021-01-24 DIAGNOSIS — E1149 Type 2 diabetes mellitus with other diabetic neurological complication: Secondary | ICD-10-CM | POA: Diagnosis not present

## 2021-01-24 DIAGNOSIS — M79671 Pain in right foot: Secondary | ICD-10-CM

## 2021-01-24 NOTE — Progress Notes (Signed)
Subjective:   Patient ID: Kristina Long, female   DOB: 65 y.o.   MRN: [id number]   HPI Patient presents with chronic lesions of the first and fifth metatarsal and fifth digits of both feet that are painful   ROS      Objective:  Physical Exam  Neurovascular static intact with 6 separate keratotic lesions painful when pressed     Assessment:  Chronic lesions secondary to foot structure with positional component     Plan:  H&P reviewed condition debrided the lesion iatrogenic bleeding reappoint routine care

## 2021-01-24 NOTE — Addendum Note (Signed)
Addended by: Solon Palm on: 01/24/2021 01:14 PM   Modules accepted: Orders

## 2021-02-03 ENCOUNTER — Telehealth: Payer: Self-pay | Admitting: Internal Medicine

## 2021-02-03 NOTE — Telephone Encounter (Signed)
Called and lvm for pt to call us back. 

## 2021-02-03 NOTE — Telephone Encounter (Signed)
Patient requests to be called at ph# [phone] re: Patient states blood sugars have been running over 200-the highest being 242 since 01/31/21 (Normally blood sugars run less that 140). Today 02/03/21 Patient states blood sugars are 206.

## 2021-02-03 NOTE — Telephone Encounter (Signed)
Called and spoke w/ pt she said that she that her blood sugar are 240,227,213,206 fasting  she is taking meformin, and [place] as written. Please advise.

## 2021-02-03 NOTE — Telephone Encounter (Signed)
Sent pt a my chart message regarding this

## 2021-02-04 ENCOUNTER — Other Ambulatory Visit: Payer: Self-pay

## 2021-02-04 MED ORDER — EMPAGLIFLOZIN 10 MG PO TABS: 10.0000 mg | ORAL_TABLET | Freq: Every day | ORAL | 0 refills | Status: DC

## 2021-02-04 NOTE — Telephone Encounter (Signed)
Pt is willing to try the Jardiance 10mg  daily

## 2021-02-04 NOTE — Telephone Encounter (Signed)
PER Dr. Lucianne Muss sent rx to [facility] notified the patient.

## 2021-02-04 NOTE — Telephone Encounter (Signed)
Pt sent a My Chart message she is willing jaundice 10mg 

## 2021-03-08 ENCOUNTER — Other Ambulatory Visit: Payer: Self-pay

## 2021-03-08 ENCOUNTER — Ambulatory Visit (INDEPENDENT_AMBULATORY_CARE_PROVIDER_SITE_OTHER): Payer: Medicare Other | Admitting: Internal Medicine

## 2021-03-08 VITALS — BP 132/80 | HR 80 | Ht 70.0 in | Wt 196.0 lb

## 2021-03-08 DIAGNOSIS — E119 Type 2 diabetes mellitus without complications: Secondary | ICD-10-CM | POA: Diagnosis not present

## 2021-03-08 LAB — LIPID PANEL
Cholesterol: 146 mg/dL (ref 0–200)
HDL: 39.2 mg/dL (ref >39.00)
LDL Cholesterol: 95 mg/dL (ref 0–99)
NonHDL: 106.4
Total CHOL/HDL Ratio: 4
Triglycerides: 55 mg/dL (ref 0.0–149.0)
VLDL: 11 mg/dL (ref 0.0–40.0)

## 2021-03-08 LAB — HEMOGLOBIN A1C: Hgb A1c MFr Bld: 6.7 % — ABNORMAL HIGH (ref 4.6–6.5)

## 2021-03-08 LAB — BASIC METABOLIC PANEL
BUN: 19 mg/dL (ref 6–23)
CO2: 27 mEq/L (ref 19–32)
Calcium: 9.8 mg/dL (ref 8.4–10.5)
Chloride: 104 mEq/L (ref 96–112)
Creatinine, Ser: 0.73 mg/dL (ref 0.40–1.20)
GFR: 85.4 mL/min (ref >60.00)
Glucose, Bld: 89 mg/dL (ref 70–99)
Potassium: 4.2 mEq/L (ref 3.5–5.1)
Sodium: 140 mEq/L (ref 135–145)

## 2021-03-08 NOTE — Patient Instructions (Signed)
-    Metformin 500 mg, TWO tablets with BREAKFAST  and TWO tablet with SUPPER -  Januvia 100 mg, 1 tablet daily      

## 2021-03-08 NOTE — Progress Notes (Signed)
Name: Kristina Long  Age/ Sex: 65 y.o., female   MRN/ DOB: [id number], 10-13-1955     PCP: Clayborn Heron, MD   Reason for Endocrinology Evaluation: Type 2 Diabetes Mellitus  Initial Endocrine Consultative Visit: 04/11/2018    PATIENT IDENTIFIER: [name] is a 65 y.o. female with a past medical history of T2DM, Seasonal allergies and OSA. The patient has followed with Endocrinology clinic since 04/12/2019 for consultative assistance with management of her diabetes.  DIABETIC HISTORY:  [name] was diagnosed with DM in 2013. She has been on Metformin since her diagnosis. She was prescribed Pioglitazone by her PCP in the past  but she never took it after she read the cardiac side effects. Her hemoglobin A1c has ranged from  in 7.3% in 2018, peaking at 10.5 % in 2013.  Januvia was cost prohibitive and by 04/2019 was replaced by Glipizide    Restarted januvia 06/2020  SUBJECTIVE:   During the last visit (08/31/2020): A1c 6.6% We continued metformin and Glipizide     Today (03/08/2021): Ms. Rancourt is here for a follow up on diabetes management.  She checks glucose once a day. The patient has not had hypoglycemic episodes since the last clinic visit.   Pt contacted the office in 01/2021 with hyperglycemia , she was started on Jardiance through  The pt noted hyperglycemia but  attributes this to faulty strips.    She continues to  enjoy her retirement No nausea or diarrhea    She was on jardiance for the month of August but did not refill due to improved glycemic control     HOME DIABETES REGIMEN:  Metformin 500 mg 2 tabs BID  Januvia 100 mg daily     METER DOWNLOAD SUMMARY:  100- 244 mg/dL    DIABETIC COMPLICATIONS: Microvascular complications:    Denies: Neuropathy, retinopathy , nephropthay Last eye exam: Completed  06/2020   Macrovascular complications:    Denies: CAD, CVA, PVD   HISTORY:  Past Medical History:  Past Medical  History:  Diagnosis Date   Allergy    Diabetes mellitus without complication (HCC)    Past Surgical History: No past surgical history on file. Social History:  reports that she has never smoked. She has never used smokeless tobacco. She reports that she does not drink alcohol and does not use drugs. Family History:  Family History  Problem Relation Age of Onset   Diabetes Father    Diabetes Maternal Grandmother      HOME MEDICATIONS: Allergies as of 03/08/2021   No Active Allergies      Medication List        Accurate as of March 08, 2021 12:59 PM. If you have any questions, ask your nurse or doctor.          calcium carbonate 600 MG tablet Commonly known as: OS-CAL Take 600 mg by mouth daily.   empagliflozin 10 MG Tabs tablet Commonly known as: Jardiance Take 1 tablet (10 mg total) by mouth daily before breakfast.   loratadine 10 MG tablet Commonly known as: CLARITIN Take 10 mg by mouth daily as needed.   metFORMIN 500 MG 24 hr tablet Commonly known as: GLUCOPHAGE-XR Take 2 tablets (1,000 mg total) by mouth 2 (two) times daily.   OneTouch Delica Lancets 33G Misc Use as instructed to check blood sugar 2 times per day   OneTouch Verio test strip Generic drug: glucose blood Use as instructed  to test blood sugar 2 times a day   sitaGLIPtin 100 MG tablet Commonly known as: Januvia Take 1 tablet (100 mg total) by mouth daily.         OBJECTIVE:   Vital Signs: BP 132/80 (BP Location: Left Arm, Patient Position: Sitting, Cuff Size: Small)   Pulse 80   Ht 5\' 10"  (1.778 m)   Wt 196 lb (88.9 kg)   LMP  (LMP Unknown)   SpO2 98%   BMI 28.12 kg/m   Wt Readings from Last 3 Encounters:  03/08/21 196 lb (88.9 kg)  09/14/20 203 lb (92.1 kg)  08/31/20 202 lb 4 oz (91.7 kg)     Exam: General: Pt appears well and is in NAD  Lungs: Clear with good BS bilat with no rales, rhonchi, or wheezes  Heart: RRR with normal    Extremities: No pretibial edema.   Skin: Normal texture and temperature to palpation.  Neuro: MS is good with appropriate affect, pt is alert and Ox3    DM foot exam:  03/08/2021     The skin of the feet is without sores or ulcerations, but with multiple callous formation  The pedal pulses are 2+ on right and 2+ on left. The sensation is intact to a screening 5.07, 10 gram monofilament bilaterally    DATA REVIEWED: Results for YAEL, COPPESS (MRN Manuella Ghazi) as of 03/09/2021 14:34  Ref. Range 03/08/2021 11:52  Sodium Latest Ref Range: 135 - 145 mEq/L 140  Potassium Latest Ref Range: 3.5 - 5.1 mEq/L 4.2  Chloride Latest Ref Range: 96 - 112 mEq/L 104  CO2 Latest Ref Range: 19 - 32 mEq/L 27  Glucose Latest Ref Range: 70 - 99 mg/dL 89  BUN Latest Ref Range: 6 - 23 mg/dL 19  Creatinine Latest Ref Range: 0.40 - 1.20 mg/dL 03/10/2021  Calcium Latest Ref Range: 8.4 - 10.5 mg/dL 9.8  GFR Latest Ref Range: >60.00 mL/min 85.40  Total CHOL/HDL Ratio Unknown 4  Cholesterol Latest Ref Range: 0 - 200 mg/dL 2.02  HDL Cholesterol Latest Ref Range: >39.00 mg/dL 542  LDL (calc) Latest Ref Range: 0 - 99 mg/dL 95  NonHDL Unknown 70.62  Triglycerides Latest Ref Range: 0.0 - 149.0 mg/dL 376.28  VLDL Latest Ref Range: 0.0 - 40.0 mg/dL 31.5  Hemoglobin 17.6 Latest Ref Range: 4.6 - 6.5 % 6.7 (H)    ASSESSMENT / PLAN / RECOMMENDATIONS:   1) Type 2 Diabetes Mellitus, Optimally controlled, without  complications - Most recent A1c of 6.7 %. Goal A1c < 7.0 %.    -  She has noted hyperglycemia but she attributes this to faulty test strips because as soon as she used a new batch her BG's have improved dramatically -She took Jardiance temporarily for the month of August but has not 1 unit and did not develop any side effects -She will continue to stay off Jardiance at this time since her A1c continues to be at goal -No changes today    MEDICATIONS: - Continue Metformin 500 mg 2 tablets twice daily - Continue Januvia 100 mg daily    EDUCATION  / INSTRUCTIONS: BG monitoring instructions: Patient is instructed to check her blood sugars 1 times a day, fasting Call [facility] if: BG persistently < 70  I reviewed the Rule of 15 for the treatment of hypoglycemia in detail with the patient. Literature supplied.  2) Diabetic complications:  Eye: She does not have known diabetic retinopathy.   Neuro/ Feet: does not have known  diabetic peripheral neuropathy. Renal: Patient does not have known baseline CKD.      3) Lipid:  -She has not been on statin therapy in the past.  Her LDL is acceptable at 95 mg/DL, a portal message has been sent to the patient recommending statin therapy based on ADA recommendation -We will discuss on next visit   F/U in 6 months  I spent 25 minutes preparing to see the patient by review of recent labs, imaging and procedures, obtaining and reviewing separately obtained history, communicating with the patient, ordering medications, tests or procedures, and documenting clinical information in the EHR including the differential Dx, treatment, and any further evaluation and other management    Signed electronically by: Lyndle Herrlich, MD  [facility]  [facility] [address] [place], [place] [address] Phone: [phone] FAX: [phone]   CC: Clayborn Heron, MD [address] [place] [address] Phone: [phone]  Fax: [phone]  Return to Endocrinology clinic as below: Future Appointments  Date Time Provider Department Center  07/12/2021 11:10 AM Reid Nawrot, Konrad Dolores, MD [id number]

## 2021-03-09 ENCOUNTER — Encounter: Payer: Self-pay | Admitting: Internal Medicine

## 2021-03-09 LAB — MICROALBUMIN / CREATININE URINE RATIO
Creatinine,U: 117.3 mg/dL
Microalb Creat Ratio: 1.2 mg/g (ref 0.0–30.0)
Microalb, Ur: 1.4 mg/dL (ref 0.0–1.9)

## 2021-03-09 MED ORDER — SITAGLIPTIN PHOSPHATE 100 MG PO TABS: 100.0000 mg | ORAL_TABLET | Freq: Every day | ORAL | 3 refills | Status: DC

## 2021-03-15 NOTE — Telephone Encounter (Signed)
Patient called to check on RX for cholesterol medication.  Called pharmacy and there is nothing there for her.  I did not see any medication being sent for this.  Please send and/or let patient know.

## 2021-03-16 ENCOUNTER — Other Ambulatory Visit: Payer: Self-pay

## 2021-03-16 MED ORDER — SITAGLIPTIN PHOSPHATE 100 MG PO TABS: 100.0000 mg | ORAL_TABLET | Freq: Every day | ORAL | 1 refills | Status: DC

## 2021-03-16 NOTE — Telephone Encounter (Signed)
Januvia resent

## 2021-03-18 ENCOUNTER — Telehealth: Payer: Self-pay | Admitting: Internal Medicine

## 2021-03-18 ENCOUNTER — Encounter: Payer: Self-pay | Admitting: Internal Medicine

## 2021-03-18 DIAGNOSIS — E785 Hyperlipidemia, unspecified: Secondary | ICD-10-CM

## 2021-03-18 NOTE — Telephone Encounter (Signed)
Pt calling to inquire about a cholesterol medication discussed between her and Shamleffer. Please call pt [phone]

## 2021-03-21 MED ORDER — ATORVASTATIN CALCIUM 10 MG PO TABS: 10.0000 mg | ORAL_TABLET | Freq: Every day | ORAL | 3 refills | Status: DC

## 2021-03-21 NOTE — Telephone Encounter (Signed)
Message has been sent to Dr. Lonzo Cloud to review

## 2021-04-11 ENCOUNTER — Emergency Department (HOSPITAL_BASED_OUTPATIENT_CLINIC_OR_DEPARTMENT_OTHER)
Admission: EM | Admit: 2021-04-11 | Discharge: 2021-04-12 | Disposition: A | Discharge status: Home / Self Care | Payer: Medicare Other | Attending: Emergency Medicine | Admitting: Emergency Medicine

## 2021-04-11 ENCOUNTER — Other Ambulatory Visit: Payer: Self-pay

## 2021-04-11 ENCOUNTER — Encounter (HOSPITAL_BASED_OUTPATIENT_CLINIC_OR_DEPARTMENT_OTHER): Payer: Self-pay | Admitting: Emergency Medicine

## 2021-04-11 DIAGNOSIS — R112 Nausea with vomiting, unspecified: Secondary | ICD-10-CM | POA: Insufficient documentation

## 2021-04-11 DIAGNOSIS — Z7984 Long term (current) use of oral hypoglycemic drugs: Secondary | ICD-10-CM | POA: Diagnosis not present

## 2021-04-11 DIAGNOSIS — R42 Dizziness and giddiness: Secondary | ICD-10-CM | POA: Diagnosis not present

## 2021-04-11 DIAGNOSIS — E119 Type 2 diabetes mellitus without complications: Secondary | ICD-10-CM | POA: Diagnosis not present

## 2021-04-11 DIAGNOSIS — R197 Diarrhea, unspecified: Secondary | ICD-10-CM | POA: Insufficient documentation

## 2021-04-11 LAB — COMPREHENSIVE METABOLIC PANEL
ALT: 30 U/L (ref 0–44)
AST: 25 U/L (ref 15–41)
Albumin: 4.2 g/dL (ref 3.5–5.0)
Alkaline Phosphatase: 68 U/L (ref 38–126)
Anion gap: 8 (ref 5–15)
BUN: 17 mg/dL (ref 8–23)
CO2: 26 mmol/L (ref 22–32)
Calcium: 9.5 mg/dL (ref 8.9–10.3)
Chloride: 104 mmol/L (ref 98–111)
Creatinine, Ser: 0.8 mg/dL (ref 0.44–1.00)
GFR, Estimated: >60 mL/min (ref >60)
Glucose, Bld: 209 mg/dL — ABNORMAL HIGH (ref 70–99)
Potassium: 3.7 mmol/L (ref 3.5–5.1)
Sodium: 138 mmol/L (ref 135–145)
Total Bilirubin: 0.3 mg/dL (ref 0.3–1.2)
Total Protein: 7.6 g/dL (ref 6.5–8.1)

## 2021-04-11 LAB — CBC
HCT: 38.7 % (ref 36.0–46.0)
Hemoglobin: 12.6 g/dL (ref 12.0–15.0)
MCH: 27.9 pg (ref 26.0–34.0)
MCHC: 32.6 g/dL (ref 30.0–36.0)
MCV: 85.6 fL (ref 80.0–100.0)
Platelets: 226 10*3/uL (ref 150–400)
RBC: 4.52 MIL/uL (ref 3.87–5.11)
RDW: 14.8 % (ref 11.5–15.5)
WBC: 8.4 10*3/uL (ref 4.0–10.5)
nRBC: 0 % (ref 0.0–0.2)

## 2021-04-11 LAB — URINALYSIS, ROUTINE W REFLEX MICROSCOPIC
Bilirubin Urine: NEGATIVE
Glucose, UA: 100 mg/dL — AB
Ketones, ur: NEGATIVE mg/dL
Leukocytes,Ua: NEGATIVE
Nitrite: NEGATIVE
Protein, ur: 30 mg/dL — AB
Specific Gravity, Urine: 1.025 (ref 1.005–1.030)
pH: 6.5 (ref 5.0–8.0)

## 2021-04-11 LAB — URINALYSIS, MICROSCOPIC (REFLEX): WBC, UA: NONE SEEN WBC/hpf (ref 0–5)

## 2021-04-11 LAB — LIPASE, BLOOD: Lipase: 26 U/L (ref 11–51)

## 2021-04-11 MED ORDER — ONDANSETRON 4 MG PO TBDP
4.0000 mg | ORAL_TABLET | Freq: Once | ORAL | Status: AC | PRN
  Administered 2021-04-11: 4 mg via ORAL
  Filled 2021-04-11: qty 1

## 2021-04-11 MED ORDER — SODIUM CHLORIDE 0.9 % IV BOLUS
1000.0000 mL | Freq: Once | INTRAVENOUS | Status: AC
  Administered 2021-04-12: 1000 mL via INTRAVENOUS

## 2021-04-11 MED ORDER — ONDANSETRON HCL 4 MG/2ML IJ SOLN
4.0000 mg | Freq: Once | INTRAMUSCULAR | Status: AC
  Administered 2021-04-12: 4 mg via INTRAVENOUS
  Filled 2021-04-11: qty 2

## 2021-04-11 NOTE — ED Triage Notes (Signed)
Pt arrives pov with c/o nausea, vomiting and dizziness that started around 1600. Pt endorses eating hamburger, vomited soon after eating burger. Endorses 5 episodes of emesis

## 2021-04-11 NOTE — ED Notes (Signed)
RT assessed patient in waiting room. NO SOB noted. SAT 98%

## 2021-04-12 ENCOUNTER — Emergency Department (HOSPITAL_BASED_OUTPATIENT_CLINIC_OR_DEPARTMENT_OTHER): Payer: Medicare Other

## 2021-04-12 LAB — TROPONIN I (HIGH SENSITIVITY)
Troponin I (High Sensitivity): 5 ng/L (ref <18)
Troponin I (High Sensitivity): 5 ng/L (ref <18)

## 2021-04-12 MED ORDER — IOHEXOL 350 MG/ML SOLN
75.0000 mL | Freq: Once | INTRAVENOUS | Status: AC | PRN
  Administered 2021-04-12: 75 mL via INTRAVENOUS

## 2021-04-12 MED ORDER — MECLIZINE HCL 25 MG PO TABS
25.0000 mg | ORAL_TABLET | Freq: Once | ORAL | Status: AC
  Administered 2021-04-12: 25 mg via ORAL
  Filled 2021-04-12: qty 1

## 2021-04-12 MED ORDER — MECLIZINE HCL 25 MG PO TABS: 25.0000 mg | ORAL_TABLET | Freq: Three times a day (TID) | ORAL | 0 refills | Status: DC | PRN

## 2021-04-12 MED ORDER — ONDANSETRON 4 MG PO TBDP: 4.0000 mg | ORAL_TABLET | Freq: Three times a day (TID) | ORAL | 0 refills | Status: DC | PRN

## 2021-04-12 NOTE — Discharge Instructions (Signed)
Take the nausea and dizziness medication as prescribed.  Follow-up with your doctor.  Return to the ED with chest pain, unilateral weakness, numbness, tingling, unable to eat or drink, difficulty speaking or difficulty swallowing or any concerns.  Your CT scan today showed some nodules on your thyroid gland and your doctor should schedule an ultrasound for further evaluation of these.

## 2021-04-12 NOTE — ED Provider Notes (Signed)
[facility] EMERGENCY DEPARTMENT Provider Note   CSN: [id number] Arrival date & time: 04/11/21  1839     History Chief Complaint  Patient presents with   Emesis    Kristina Long is a 65 y.o. female.  Patient with a history of diabetes on metformin here with dizziness, nausea and vomiting since about 4 PM.  States she had a good day and [birth year] birthday.  She ate shrimp and grits around 11 AM.  Around 4 PM while she was sitting in the parking lot she began to feel lightheaded, dizzy, "swimmy headed" with spinning and nausea. She feels like she is having dizziness with lightheadedness and has had several episodes of nausea and vomiting times about 5 since.  She is feeling better after sleeping this afternoon.  She called her PCP was referred to the ED with some concern for food poisoning.  Has not had any diarrhea.  Has not had any fever.  No chest pain or shortness of breath.  No abdominal pain.  No headache or visual changes. No history of similar symptoms.  No history of vertigo. She still feels off balance and lightheaded and dizzy with spinning sensation and swimmy headed.  No focal weakness, numbness or tingling.  She feels better than since she arrived.   The history is provided by the patient.  Emesis Associated symptoms: no abdominal pain, no arthralgias, no cough, no fever, no headaches and no myalgias       Past Medical History:  Diagnosis Date   Allergy    Diabetes mellitus without complication [facility])     Patient Active Problem List   Diagnosis Date Noted   Type 2 diabetes mellitus without complication, without long-term current use of insulin (HCC) 08/31/2020   Type 2 diabetes mellitus with hyperglycemia, with long-term current use of insulin (HCC) 05/16/2019    History reviewed. No pertinent surgical history.   OB History   No obstetric history on file.     Family History  Problem Relation Age of Onset   Diabetes Father    Diabetes Maternal  Grandmother     Social History   Tobacco Use   Smoking status: Never   Smokeless tobacco: Never  Vaping Use   Vaping Use: Never used  Substance Use Topics   Alcohol use: No   Drug use: No    Home Medications Prior to Admission medications   Medication Sig Start Date End Date Taking? Authorizing Provider  atorvastatin (LIPITOR) 10 MG tablet Take 1 tablet (10 mg total) by mouth daily. 03/21/21   Shamleffer, Konrad Dolores, MD  calcium carbonate (OS-CAL) 600 MG tablet Take 600 mg by mouth daily.    [provider]  glucose blood (ONETOUCH VERIO) test strip Use as instructed to test blood sugar 2 times a day 07/01/20   Shamleffer, Konrad Dolores, MD  loratadine (CLARITIN) 10 MG tablet Take 10 mg by mouth daily as needed.    [provider]  metFORMIN (GLUCOPHAGE-XR) 500 MG 24 hr tablet Take 2 tablets (1,000 mg total) by mouth 2 (two) times daily. 10/21/20   Shamleffer, Konrad Dolores, MD  OneTouch Delica Lancets 33G MISC Use as instructed to check blood sugar 2 times per day 07/02/20   Shamleffer, Konrad Dolores, MD  sitaGLIPtin (JANUVIA) 100 MG tablet Take 1 tablet (100 mg total) by mouth daily. 03/16/21   Shamleffer, Konrad Dolores, MD    Allergies    Patient has no active allergies.  Review of Systems  Review of Systems  Constitutional:  Negative for activity change, appetite change, fatigue and fever.  HENT:  Negative for congestion.   Respiratory:  Negative for cough, chest tightness and shortness of breath.   Cardiovascular:  Negative for chest pain and leg swelling.  Gastrointestinal:  Positive for nausea and vomiting. Negative for abdominal pain.  Genitourinary:  Negative for dysuria.  Musculoskeletal:  Negative for arthralgias, back pain and myalgias.  Skin:  Negative for rash.  Neurological:  Positive for dizziness and light-headedness. Negative for weakness and headaches.   all other systems are negative except as noted in the HPI and PMH.   Physical  Exam Updated Vital Signs BP (!) 174/90   Pulse 77   Temp 98.6 F (37 C) (Oral)   Resp 14   LMP  (LMP Unknown)   SpO2 97%   Physical Exam Vitals and nursing note reviewed.  Constitutional:      General: She is not in acute distress.    Appearance: She is well-developed.  HENT:     Head: Normocephalic and atraumatic.     Mouth/Throat:     Pharynx: No oropharyngeal exudate.  Eyes:     Conjunctiva/sclera: Conjunctivae normal.     Pupils: Pupils are equal, round, and reactive to light.  Neck:     Comments: No meningismus. Cardiovascular:     Rate and Rhythm: Normal rate and regular rhythm.     Heart sounds: Normal heart sounds. No murmur heard. Pulmonary:     Effort: Pulmonary effort is normal. No respiratory distress.     Breath sounds: Normal breath sounds.  Abdominal:     Palpations: Abdomen is soft.     Tenderness: There is no abdominal tenderness. There is no guarding or rebound.  Musculoskeletal:        General: No tenderness. Normal range of motion.     Cervical back: Normal range of motion and neck supple.  Skin:    General: Skin is warm.  Neurological:     Mental Status: She is alert and oriented to person, place, and time.     Cranial Nerves: No cranial nerve deficit.     Motor: No abnormal muscle tone.     Coordination: Coordination normal.     Comments: CN 2-12 intact, no ataxia on finger to nose, no nystagmus, 5/5 strength throughout, no pronator drift, Romberg negative, normal gait.  No ataxia on finger-to-nose.  No nystagmus. Test of skew negative.  Head impulse testing negative   Psychiatric:        Behavior: Behavior normal.    ED Results / Procedures / Treatments   Labs (all labs ordered are listed, but only abnormal results are displayed) Labs Reviewed  COMPREHENSIVE METABOLIC PANEL - Abnormal; Notable for the following components:      Result Value   Glucose, Bld 209 (*)    All other components within normal limits  URINALYSIS, ROUTINE W  REFLEX MICROSCOPIC - Abnormal; Notable for the following components:   Glucose, UA 100 (*)    Hgb urine dipstick SMALL (*)    Protein, ur 30 (*)    All other components within normal limits  URINALYSIS, MICROSCOPIC (REFLEX) - Abnormal; Notable for the following components:   Bacteria, UA FEW (*)    All other components within normal limits  LIPASE, BLOOD  CBC  TROPONIN I (HIGH SENSITIVITY)  TROPONIN I (HIGH SENSITIVITY)    EKG EKG Interpretation  Date/Time:  Monday April 11 2021 20:01:41 EDT Ventricular Rate:  63 PR Interval:  198 QRS Duration: 80 QT Interval:  420 QTC Calculation: 429 R Axis:   70 Text Interpretation: Normal sinus rhythm with sinus arrhythmia Low voltage QRS Nonspecific T wave abnormality Abnormal ECG T waves now flatter No significant change was found Confirmed by Glynn Octave [phone]) on 04/11/2021 11:40:34 PM  Radiology CT ANGIO HEAD NECK W WO CM  Result Date: 04/12/2021 CLINICAL DATA:  Vertigo EXAM: CT ANGIOGRAPHY HEAD AND NECK TECHNIQUE: Multidetector CT imaging of the head and neck was performed using the standard protocol during bolus administration of intravenous contrast. Multiplanar CT image reconstructions and MIPs were obtained to evaluate the vascular anatomy. Carotid stenosis measurements (when applicable) are obtained utilizing NASCET criteria, using the distal internal carotid diameter as the denominator. CONTRAST:  60mL OMNIPAQUE IOHEXOL 350 MG/ML SOLN COMPARISON:  No prior CTA, no other neuro imaging is available for comparison FINDINGS: CT HEAD FINDINGS Brain: No evidence of acute infarction, hemorrhage, cerebral edema, mass, mass effect, or midline shift. Ventricles and sulci are within normal limits for age. No extra-axial fluid collection. Vascular: No hyperdense vessel or unexpected calcification. Skull: Hyperostosis frontalis. Negative for fracture or focal lesion. Sinuses/Orbits: No acute finding. Other: The mastoid air cells are well  aerated. CTA NECK FINDINGS Aortic arch: Standard branching. Imaged portion shows no evidence of aneurysm or dissection. No significant stenosis of the major arch vessel origins. Right carotid system: No evidence of dissection, stenosis (50% or greater) or occlusion. Left carotid system: No evidence of dissection, stenosis (50% or greater) or occlusion. Vertebral arteries: Codominant. No evidence of dissection, stenosis (50% or greater) or occlusion. Skeleton: No acute osseous abnormality. Degenerative changes in the bilateral temporomandibular joints. Other neck: Multiple heterogeneously enhancing nodules in the thyroid, which is enlarged. The largest heterogeneously enhancing nodule is in the right lobe and measures up to 2.1 x 1.9 cm (series 12, image 86). Upper chest: Negative. Review of the MIP images confirms the above findings CTA HEAD FINDINGS Anterior circulation: Both internal carotid arteries are patent to the termini, without stenosis or other abnormality. A1 segments patent. Normal anterior communicating artery. Anterior cerebral arteries are patent to their distal aspects. No M1 stenosis or occlusion. Normal MCA bifurcations. Distal MCA branches perfused and symmetric. Posterior circulation: Vertebral arteries widely patent to the vertebrobasilar junction without stenosis. Posterior inferior cerebral arteries patent bilaterally. Basilar patent to its distal aspect. Superior cerebral arteries patent bilaterally. Hypoplastic right P1 with a patent right posterior communicating artery. The left posterior communicating artery is not visualized. PCAs well perfused to their distal aspects without stenosis. Venous sinuses: As permitted by contrast timing, patent. Anatomic variants: None significant Review of the MIP images confirms the above findings IMPRESSION: 1. No acute intracranial process. 2. No intracranial large vessel occlusion. 3. No hemodynamically significant stenosis in the neck. 4. Multiple  heterogeneously enhancing nodules in the thyroid, the largest of which measures up to 2.1 cm. If this has not previously been evaluated, an ultrasound of the thyroid is recommended. Electronically Signed   By: Wiliam Ke M.D.   On: 04/12/2021 01:17    Procedures Procedures   Medications Ordered in ED Medications  sodium chloride 0.9 % bolus 1,000 mL (has no administration in time range)  ondansetron (ZOFRAN) injection 4 mg (has no administration in time range)  meclizine (ANTIVERT) tablet 25 mg (has no administration in time range)  ondansetron (ZOFRAN-ODT) disintegrating tablet 4 mg (4 mg Oral Given 04/11/21 1913)    ED Course  I have reviewed the  triage vital signs and the nursing notes.  Pertinent labs & imaging results that were available during my care of the patient were reviewed by me and considered in my medical decision making (see chart for details).    MDM Rules/Calculators/A&P                          Vertigo with nausea and vomiting after eating shrimp this afternoon.  Neurological exam is nonfocal.  No nystagmus or ataxia.  EKG is sinus rhythm. No chest pain or SOB.  Labs show hyperglycemia without evidence of DKA.  Troponin is normal x2.  EKG is nonischemic  Nausea vomiting and dizziness have improved.  She is able to tolerate p.o. and ambulatory.  CTA shows no evidence of critical stenosis or occlusion.  Suspect likely peripheral vertigo in setting of nausea and vomiting from suspicious food intake.  Low suspicion for central cause of vertigo or CVA Patient informed of thyroid nodule need for follow-up Discussed with patient that CVA cannot be completely ruled out without MRI which is not available at this time.  She is tolerating p.o. and ambulatory.  States she feels back to baseline and not dizzy or lightheaded.  Will treat for vertigo with antiemetics and meclizine.  Nonfocal neurological exam.  Troponin negative x2 without evidence of ACS.  Follow-up with  PCP.  Return precautions discussed Final Clinical Impression(s) / ED Diagnoses Final diagnoses:  Nausea and vomiting, unspecified vomiting type  Vertigo    Rx / DC Orders ED Discharge Orders     None        Christen Bedoya, Jeannett Senior, MD 04/12/21 [phone]

## 2021-05-02 ENCOUNTER — Encounter: Payer: Self-pay | Admitting: Podiatry

## 2021-05-02 ENCOUNTER — Other Ambulatory Visit: Payer: Self-pay

## 2021-05-02 ENCOUNTER — Ambulatory Visit (INDEPENDENT_AMBULATORY_CARE_PROVIDER_SITE_OTHER): Payer: Medicare Other | Admitting: Podiatry

## 2021-05-02 DIAGNOSIS — E1140 Type 2 diabetes mellitus with diabetic neuropathy, unspecified: Secondary | ICD-10-CM

## 2021-05-02 DIAGNOSIS — Q828 Other specified congenital malformations of skin: Secondary | ICD-10-CM | POA: Diagnosis not present

## 2021-05-02 DIAGNOSIS — Z794 Long term (current) use of insulin: Secondary | ICD-10-CM | POA: Diagnosis not present

## 2021-05-02 DIAGNOSIS — E1165 Type 2 diabetes mellitus with hyperglycemia: Secondary | ICD-10-CM | POA: Diagnosis not present

## 2021-05-02 DIAGNOSIS — E1149 Type 2 diabetes mellitus with other diabetic neurological complication: Secondary | ICD-10-CM

## 2021-05-04 NOTE — Progress Notes (Signed)
Subjective:   Patient ID: Kristina Long, female   DOB: 65 y.o.   MRN: [id number]   HPI Patient presents stating that she has chronic calluses and that she does have long-term diabetes with moderate neuropathic complications   ROS      Objective:  Physical Exam  Neurovascular status unchanged with thick keratotic lesions of first fifth metatarsal bilateral that is painful when pressed with lucent course     Assessment:  Chronic keratotic lesion formation bilateral with at risk diabetic long-term on insulin     Plan:  Sterile sharp debridement accomplished today no iatrogenic bleeding reappoint for routine care as needed

## 2021-05-13 ENCOUNTER — Encounter (HOSPITAL_COMMUNITY): Payer: Self-pay | Admitting: Radiology

## 2021-06-09 ENCOUNTER — Other Ambulatory Visit: Payer: Self-pay | Admitting: Internal Medicine

## 2021-06-15 ENCOUNTER — Other Ambulatory Visit: Payer: Self-pay | Admitting: Family Medicine

## 2021-06-15 DIAGNOSIS — E041 Nontoxic single thyroid nodule: Secondary | ICD-10-CM

## 2021-06-30 ENCOUNTER — Ambulatory Visit
Admission: RE | Admit: 2021-06-30 | Discharge: 2021-06-30 | Disposition: A | Discharge status: Home / Self Care | Payer: Medicare Other | Source: Ambulatory Visit | Attending: Family Medicine | Admitting: Family Medicine

## 2021-06-30 DIAGNOSIS — E041 Nontoxic single thyroid nodule: Secondary | ICD-10-CM

## 2021-07-12 ENCOUNTER — Ambulatory Visit (INDEPENDENT_AMBULATORY_CARE_PROVIDER_SITE_OTHER): Payer: Medicare Other | Admitting: Internal Medicine

## 2021-07-12 ENCOUNTER — Encounter: Payer: Self-pay | Admitting: Internal Medicine

## 2021-07-12 VITALS — BP 122/80 | HR 100 | Ht 70.0 in | Wt 197.0 lb

## 2021-07-12 DIAGNOSIS — Z794 Long term (current) use of insulin: Secondary | ICD-10-CM

## 2021-07-12 DIAGNOSIS — E785 Hyperlipidemia, unspecified: Secondary | ICD-10-CM

## 2021-07-12 DIAGNOSIS — E042 Nontoxic multinodular goiter: Secondary | ICD-10-CM

## 2021-07-12 DIAGNOSIS — E1165 Type 2 diabetes mellitus with hyperglycemia: Secondary | ICD-10-CM

## 2021-07-12 LAB — COMPREHENSIVE METABOLIC PANEL
ALT: 27 U/L (ref 0–35)
AST: 20 U/L (ref 0–37)
Albumin: 4.6 g/dL (ref 3.5–5.2)
Alkaline Phosphatase: 83 U/L (ref 39–117)
BUN: 18 mg/dL (ref 6–23)
CO2: 26 mEq/L (ref 19–32)
Calcium: 10.2 mg/dL (ref 8.4–10.5)
Chloride: 104 mEq/L (ref 96–112)
Creatinine, Ser: 0.77 mg/dL (ref 0.40–1.20)
GFR: 79.92 mL/min (ref >60.00)
Glucose, Bld: 119 mg/dL — ABNORMAL HIGH (ref 70–99)
Potassium: 4.1 mEq/L (ref 3.5–5.1)
Sodium: 141 mEq/L (ref 135–145)
Total Bilirubin: 0.5 mg/dL (ref 0.2–1.2)
Total Protein: 7.9 g/dL (ref 6.0–8.3)

## 2021-07-12 LAB — POCT GLYCOSYLATED HEMOGLOBIN (HGB A1C): Hemoglobin A1C: 6.9 % — AB (ref 4.0–5.6)

## 2021-07-12 LAB — POCT GLUCOSE (DEVICE FOR HOME USE): Glucose Fasting, POC: 156 mg/dL — AB (ref 70–99)

## 2021-07-12 LAB — LIPID PANEL
Cholesterol: 106 mg/dL (ref 0–200)
HDL: 41.3 mg/dL (ref >39.00)
LDL Cholesterol: 55 mg/dL (ref 0–99)
NonHDL: 64.94
Total CHOL/HDL Ratio: 3
Triglycerides: 50 mg/dL (ref 0.0–149.0)
VLDL: 10 mg/dL (ref 0.0–40.0)

## 2021-07-12 NOTE — Progress Notes (Signed)
Name: Kristina Long  Age/ Sex: 66 y.o., female   MRN/ DOB: [id number], Sep 01, 1955     PCP: Aretta Nip, MD   Reason for Endocrinology Evaluation: Type 2 Diabetes Mellitus  Initial Endocrine Consultative Visit: 04/11/2018    PATIENT IDENTIFIER: [name] is a 66 y.o. female with a past medical history of T2DM, Seasonal allergies and OSA. The patient has followed with Endocrinology clinic since 04/12/2019 for consultative assistance with management of her diabetes.  DIABETIC HISTORY:  [name] was diagnosed with DM in 2013. She has been on Metformin since her diagnosis. She was prescribed Pioglitazone by her PCP in the past  but she never took it after she read the cardiac side effects. Her hemoglobin A1c has ranged from  in 7.3% in 2018, peaking at 10.5 % in 2013.  Januvia was cost prohibitive and by 04/2019 was replaced by Glipizide    Restarted januvia 06/2020    THYROID HISTORY: During evaluation for vertigo a CT Scan showed an incidental finding of  multiple thyroid nodules with the largest 2.1 cm on the right, this prompted a thyroid ultrasound which was done 06/30/2021 revealing MNG with right superior and left inferior nodules meeting FNA criteria.    SUBJECTIVE:   During the last visit (03/08/2021): A1c 6.7% We continued metformin and Glipizide     Today (07/12/2021): [name] is here for a follow up on diabetes management.  She checks glucose once a day. The patient has not had hypoglycemic episodes since the last clinic visit.   Since her last visit she was diagnosed with MNG and two of her nodules meet FNA criteria   Denies local neck symptoms  Nausea , vomiting or diarrhea     HOME ENDOCRINE REGIMEN:  Metformin 500 mg 2 tabs BID  Januvia 100 mg daily  Atorvastatin 10 mg daily     METER DOWNLOAD SUMMARY:  120-140 mg/dL    DIABETIC COMPLICATIONS: Microvascular complications:    Denies: Neuropathy, retinopathy ,  nephropthay Last eye exam: Completed 06/2020   Macrovascular complications:    Denies: CAD, CVA, PVD   HISTORY:  Past Medical History:  Past Medical History:  Diagnosis Date   Allergy    Diabetes mellitus without complication ([place])    Past Surgical History: No past surgical history on file. Social History:  reports that she has never smoked. She has never used smokeless tobacco. She reports that she does not drink alcohol and does not use drugs. Family History:  Family History  Problem Relation Age of Onset   Diabetes Father    Diabetes Maternal Grandmother      HOME MEDICATIONS: Allergies as of 07/12/2021   No Active Allergies      Medication List        Accurate as of July 12, 2021 11:46 AM. If you have any questions, ask your nurse or doctor.          STOP taking these medications    meclizine 25 MG tablet Commonly known as: ANTIVERT Stopped by: Dorita Sciara, MD   ondansetron 4 MG disintegrating tablet Commonly known as: Zofran ODT Stopped by: Dorita Sciara, MD       TAKE these medications    atorvastatin 10 MG tablet Commonly known as: LIPITOR Take 1 tablet (10 mg total) by mouth daily.   calcium carbonate 600 MG tablet Commonly known as: OS-CAL Take 600 mg by mouth daily.   lisinopril  2.5 MG tablet Commonly known as: ZESTRIL Take 2.5 mg by mouth daily.   loratadine 10 MG tablet Commonly known as: CLARITIN Take 10 mg by mouth daily as needed.   metFORMIN 500 MG 24 hr tablet Commonly known as: GLUCOPHAGE-XR Take 2 tablets (1,000 mg total) by mouth 2 (two) times daily.   OneTouch Delica Lancets 99991111 Misc Use as instructed to check blood sugar 2 times per day   OneTouch Verio test strip Generic drug: glucose blood Use as instructed to test blood sugar 2 times a day   sitaGLIPtin 100 MG tablet Commonly known as: Januvia Take 1 tablet (100 mg total) by mouth daily.         OBJECTIVE:   Vital Signs: BP 122/80  (BP Location: Left Arm, Patient Position: Sitting, Cuff Size: Small)    Pulse 100    Ht 5\' 10"  (1.778 m)    Wt 197 lb (89.4 kg)    LMP  (LMP Unknown)    SpO2 96%    BMI 28.27 kg/m   Wt Readings from Last 3 Encounters:  07/12/21 197 lb (89.4 kg)  03/08/21 196 lb (88.9 kg)  09/14/20 203 lb (92.1 kg)     Exam: General: Pt appears well and is in NAD  Lungs: Clear with good BS bilat with no rales, rhonchi, or wheezes  Heart: RRR with normal    Extremities: No pretibial edema.  Skin: Normal texture and temperature to palpation.  Neuro: MS is good with appropriate affect, pt is alert and Ox3    DM foot exam:  03/08/2021     The skin of the feet is without sores or ulcerations, but with multiple callous formation  The pedal pulses are 2+ on right and 2+ on left. The sensation is intact to a screening 5.07, 10 gram monofilament bilaterally    DATA REVIEWED:   Latest Reference Range & Units 07/12/21 12:17  Sodium 135 - 145 mEq/L 141  Potassium 3.5 - 5.1 mEq/L 4.1  Chloride 96 - 112 mEq/L 104  CO2 19 - 32 mEq/L 26  Glucose 70 - 99 mg/dL 119 (H)  BUN 6 - 23 mg/dL 18  Creatinine 0.40 - 1.20 mg/dL 0.77  Calcium 8.4 - 10.5 mg/dL 10.2  Alkaline Phosphatase 39 - 117 U/L 83  Albumin 3.5 - 5.2 g/dL 4.6  AST 0 - 37 U/L 20  ALT 0 - 35 U/L 27  Total Protein 6.0 - 8.3 g/dL 7.9  Total Bilirubin 0.2 - 1.2 mg/dL 0.5  GFR >60.00 mL/min 79.92     Latest Reference Range & Units 07/12/21 12:17  Total CHOL/HDL Ratio  3  Cholesterol 0 - 200 mg/dL 106  HDL Cholesterol >39.00 mg/dL 41.30  LDL (calc) 0 - 99 mg/dL 55  NonHDL  64.94  Triglycerides 0.0 - 149.0 mg/dL 50.0  VLDL 0.0 - 40.0 mg/dL 10.0        Thyroid ULtrasound 06/30/2021  Estimated total number of nodules >/= 1 cm: 6-10   Number of spongiform nodules >/=  2 cm not described below (TR1): 0   Number of mixed cystic and solid nodules >/= 1.5 cm not described below ([place]): 0    _________________________________________________________   Nodule # 3:   Location: Right; Superior   Maximum size: 1.5 cm; Other 2 dimensions: 1.4 x 0.8 cm   Composition: solid/almost completely solid (2)   Echogenicity: hypoechoic (2)   Shape: not taller-than-wide (0)   Margins: smooth (0)   Echogenic foci: none (0)  ACR TI-RADS total points: 4.   ACR TI-RADS risk category: TR4 (4-6 points).   ACR TI-RADS recommendations:   **Given size (>/= 1.5 cm) and appearance, fine needle aspiration of this moderately suspicious nodule should be considered based on TI-RADS criteria.   _________________________________________________________   Nodule # 4:   Location: Right; Mid   Maximum size: 1.9 cm; Other 2 dimensions: 1.7 x 1.3 cm   Composition: solid/almost completely solid (2)   Echogenicity: isoechoic (1)   Shape: not taller-than-wide (0)   Margins: ill-defined (0)   Echogenic foci: none (0)   ACR TI-RADS total points: 3.   ACR TI-RADS risk category: TR3 (3 points).   ACR TI-RADS recommendations:   *Given size (>/= 1.5 - 2.4 cm) and appearance, a follow-up ultrasound in 1 year should be considered based on TI-RADS criteria.   _________________________________________________________   Nodule # 5:   Location: Right; Inferior   Maximum size: 2.4 cm; Other 2 dimensions: 2.2 x 2.1 cm   Composition: solid/almost completely solid (2)   Echogenicity: isoechoic (1)   Shape: not taller-than-wide (0)   Margins: smooth (0)   Echogenic foci: none (0)   ACR TI-RADS total points: 3.   ACR TI-RADS risk category: TR3 (3 points).   ACR TI-RADS recommendations:   *Given size (>/= 1.5 - 2.4 cm) and appearance, a follow-up ultrasound in 1 year should be considered based on TI-RADS criteria.   _________________________________________________________   Nodule # 6:   Location: Left; Mid   Maximum size: 1.7 cm; Other 2 dimensions: 1.4 x 0.9 cm    Composition: solid/almost completely solid (2)   Echogenicity: isoechoic (1)   Shape: not taller-than-wide (0)   Margins: smooth (0)   Echogenic foci: none (0)   ACR TI-RADS total points: 3.   ACR TI-RADS risk category: TR3 (3 points).   ACR TI-RADS recommendations:   *Given size (>/= 1.5 - 2.4 cm) and appearance, a follow-up ultrasound in 1 year should be considered based on TI-RADS criteria.   _________________________________________________________   Nodule # 9:   Location: Left; Inferior   Maximum size: 2.1 cm; Other 2 dimensions: 1.7 x 1.6 cm   Composition: solid/almost completely solid (2)   Echogenicity: hypoechoic (2)   Shape: not taller-than-wide (0)   Margins: ill-defined (0)   Echogenic foci: none (0)   ACR TI-RADS total points: 4.   ACR TI-RADS risk category: TR4 (4-6 points).   ACR TI-RADS recommendations:   **Given size (>/= 1.5 cm) and appearance, fine needle aspiration of this moderately suspicious nodule should be considered based on TI-RADS criteria.   _________________________________________________________   There are additional bilateral hypoechoic and isoechoic nodules noted however these are smaller than the nodules documented above.   No hypervascularity.  No regional adenopathy.   IMPRESSION: 1.5 cm right superior TR 4 nodule and 2.1 cm left inferior TR 4 nodule. (Nodules 3 and 9). Both meet criteria for biopsy as above.   Additional nodules warrant follow-up as above.   The above is in keeping with the ACR TI-RADS recommendations - J Am Coll Radiol 2017;14:587-595.  ASSESSMENT / PLAN / RECOMMENDATIONS:   1) Type 2 Diabetes Mellitus, Optimally controlled, without  complications - Most recent A1c of 6.9 %. Goal A1c < 7.0 %.     - A1c continues to be at goal  - Encouraged low carb diet and exercise  -No changes today    MEDICATIONS: - Continue Metformin 500 mg 2 tablets twice daily - Continue Januvia 100  mg daily     EDUCATION / INSTRUCTIONS: BG monitoring instructions: Patient is instructed to check her blood sugars 1 times a day, fasting Call [facility] if: BG persistently < 70  I reviewed the Rule of 15 for the treatment of hypoglycemia in detail with the patient. Literature supplied.  2) Diabetic complications:  Eye: She does not have known diabetic retinopathy.   Neuro/ Feet: does not have known diabetic peripheral neuropathy. Renal: Patient does not have known baseline CKD.      3) Dyslipidemia:  - LDL was 95 mg/dL in 02/2021 and we started small dose of atorvastatin . Tolerating well  - LDL at goal at 55 mg/dL    Medication   Continue Atorvastatin 10 mg daily   4) Multinodular Goiter :  - No local neck symptoms  - Will proceed with FNA of the right superior and left inferior nodules     F/U in 6 months    Signed electronically by: Mack Guise, MD  [facility] End[facility]in[facility]  [facility] [address] [place] [employer], [place] [address] Phone: [phone] FAX: [phone]   CC: Aretta Nip, [place] [address] Phone: [phone]  Fax: [phone]  Return to Endocrinology clinic as below: No future appointments.

## 2021-07-12 NOTE — Patient Instructions (Signed)
-    Metformin 500 mg, TWO tablets with BREAKFAST  and TWO tablet with SUPPER -  Januvia 100 mg, 1 tablet daily      

## 2021-07-20 LAB — HM DIABETES EYE EXAM

## 2021-07-22 ENCOUNTER — Encounter: Payer: Self-pay | Admitting: Family Medicine

## 2021-07-26 ENCOUNTER — Ambulatory Visit
Admission: RE | Admit: 2021-07-26 | Discharge: 2021-07-26 | Disposition: A | Discharge status: Home / Self Care | Payer: Medicare Other | Source: Ambulatory Visit | Attending: Internal Medicine | Admitting: Internal Medicine

## 2021-07-26 ENCOUNTER — Other Ambulatory Visit (HOSPITAL_COMMUNITY)
Admission: RE | Admit: 2021-07-26 | Discharge: 2021-07-26 | Disposition: A | Discharge status: Home / Self Care | Payer: Medicare Other | Source: Ambulatory Visit | Attending: Physician Assistant | Admitting: Physician Assistant

## 2021-07-26 ENCOUNTER — Other Ambulatory Visit: Payer: Self-pay | Admitting: Internal Medicine

## 2021-07-26 DIAGNOSIS — E042 Nontoxic multinodular goiter: Secondary | ICD-10-CM

## 2021-07-26 DIAGNOSIS — E041 Nontoxic single thyroid nodule: Secondary | ICD-10-CM | POA: Insufficient documentation

## 2021-07-28 LAB — CYTOLOGY - NON PAP

## 2021-07-29 ENCOUNTER — Encounter: Payer: Self-pay | Admitting: Internal Medicine

## 2021-07-29 NOTE — Telephone Encounter (Signed)
Spoke to the patient on 07/29/2021 at 1650  Discussed scant cellularity of both thyroid nodule biopsies   I have recommended repeat FNA in 3 months, the patient reluctant to proceed this at this time so we have opted to do a short-term thyroid ultrasound by her next visit in the summer   Patient declined any surgical intervention She understand that it is unclear the risk of cancer at this time without a tissue sample.   Abby Nena Jordan, MD  [facility] End[facility]  [facility] [address] [place], [place] [address] Phone: [phone] FAX: [phone]

## 2021-08-10 ENCOUNTER — Other Ambulatory Visit: Payer: Self-pay

## 2021-08-10 ENCOUNTER — Ambulatory Visit (INDEPENDENT_AMBULATORY_CARE_PROVIDER_SITE_OTHER): Payer: Medicare Other | Admitting: Podiatry

## 2021-08-10 ENCOUNTER — Encounter: Payer: Self-pay | Admitting: Podiatry

## 2021-08-10 DIAGNOSIS — E1165 Type 2 diabetes mellitus with hyperglycemia: Secondary | ICD-10-CM | POA: Diagnosis not present

## 2021-08-10 DIAGNOSIS — Z794 Long term (current) use of insulin: Secondary | ICD-10-CM

## 2021-08-10 DIAGNOSIS — Q828 Other specified congenital malformations of skin: Secondary | ICD-10-CM

## 2021-08-10 NOTE — Progress Notes (Signed)
Subjective:   Patient ID: Kristina Long, female   DOB: 66 y.o.   MRN: [id number]   HPI Patient presents stating she needs to have her lesions trim she is a diabetic cannot take care of it may get sore   ROS      Objective:  Physical Exam  Neurovascular status shows keratotic lesions of third metatarsals bilateral and third digit right with pain patient has diminished neurological status     Assessment:  Chronic keratotic lesion formation with pain with at risk patient long-term diabetic neuropathy     Plan:  Debridement of lesions no iatrogenic bleeding reappoint routine care

## 2021-08-25 ENCOUNTER — Telehealth: Payer: Self-pay

## 2021-08-25 NOTE — Telephone Encounter (Signed)
Lab results mailed to patient per her request.

## 2021-09-16 ENCOUNTER — Other Ambulatory Visit: Payer: Self-pay | Admitting: Internal Medicine

## 2021-09-20 ENCOUNTER — Other Ambulatory Visit: Payer: Self-pay | Admitting: Internal Medicine

## 2021-10-18 ENCOUNTER — Telehealth: Payer: Self-pay

## 2021-10-18 NOTE — Telephone Encounter (Signed)
Patient states that her blood sugars have been running in there 130-150 for last 2 months. Patient wants to know if medication needs to be adjusted.  ?

## 2021-10-19 NOTE — Telephone Encounter (Signed)
Patient advised and verbalized understanding 

## 2021-11-09 ENCOUNTER — Ambulatory Visit (INDEPENDENT_AMBULATORY_CARE_PROVIDER_SITE_OTHER): Payer: Medicare Other | Admitting: Podiatry

## 2021-11-09 ENCOUNTER — Encounter: Payer: Self-pay | Admitting: Podiatry

## 2021-11-09 DIAGNOSIS — E1149 Type 2 diabetes mellitus with other diabetic neurological complication: Secondary | ICD-10-CM

## 2021-11-09 DIAGNOSIS — Q828 Other specified congenital malformations of skin: Secondary | ICD-10-CM

## 2021-11-09 DIAGNOSIS — E1140 Type 2 diabetes mellitus with diabetic neuropathy, unspecified: Secondary | ICD-10-CM

## 2021-11-09 NOTE — Progress Notes (Signed)
Subjective:  ? ?Patient ID: Kristina Long, female   DOB: 66 y.o.   MRN: [id number]  ? ?HPI ?Long-term diabetic presents with chronic lesion formation bilateral feet that are painful when pressed with long-term history of neuropathy ? ? ?ROS ? ? ?   ?Objective:  ?Physical Exam  ?Neurovascular status found to be intact thick keratotic lesions noted plantar aspect both feet that are painful with long-term diabetes ? ?   ?Assessment:  ?At risk patient with keratotic lesion bilateral ? ?   ?Plan:  ?Aggressive debridement accomplished bilateral no iatrogenic bleeding reappoint routine care ?   ? ? ?

## 2021-12-17 ENCOUNTER — Other Ambulatory Visit: Payer: Self-pay | Admitting: Internal Medicine

## 2022-01-03 ENCOUNTER — Encounter: Payer: Self-pay | Admitting: Internal Medicine

## 2022-01-03 ENCOUNTER — Ambulatory Visit (INDEPENDENT_AMBULATORY_CARE_PROVIDER_SITE_OTHER): Payer: Medicare Other | Admitting: Internal Medicine

## 2022-01-03 VITALS — BP 124/80 | HR 77 | Ht 70.0 in | Wt 201.0 lb

## 2022-01-03 DIAGNOSIS — Z794 Long term (current) use of insulin: Secondary | ICD-10-CM | POA: Diagnosis not present

## 2022-01-03 DIAGNOSIS — E042 Nontoxic multinodular goiter: Secondary | ICD-10-CM | POA: Diagnosis not present

## 2022-01-03 DIAGNOSIS — E1165 Type 2 diabetes mellitus with hyperglycemia: Secondary | ICD-10-CM | POA: Diagnosis not present

## 2022-01-03 LAB — BASIC METABOLIC PANEL
BUN: 14 mg/dL (ref 6–23)
CO2: 26 mEq/L (ref 19–32)
Calcium: 9.6 mg/dL (ref 8.4–10.5)
Chloride: 104 mEq/L (ref 96–112)
Creatinine, Ser: 0.74 mg/dL (ref 0.40–1.20)
GFR: 83.54 mL/min (ref >60.00)
Glucose, Bld: 141 mg/dL — ABNORMAL HIGH (ref 70–99)
Potassium: 3.9 mEq/L (ref 3.5–5.1)
Sodium: 140 mEq/L (ref 135–145)

## 2022-01-03 LAB — MICROALBUMIN / CREATININE URINE RATIO
Creatinine,U: 115.9 mg/dL
Microalb Creat Ratio: 1.1 mg/g (ref 0.0–30.0)
Microalb, Ur: 1.3 mg/dL (ref 0.0–1.9)

## 2022-01-03 LAB — POCT GLUCOSE (DEVICE FOR HOME USE): Glucose Fasting, POC: 155 mg/dL — AB (ref 70–99)

## 2022-01-03 LAB — POCT GLYCOSYLATED HEMOGLOBIN (HGB A1C): Hemoglobin A1C: 8.1 % — AB (ref 4.0–5.6)

## 2022-01-03 LAB — TSH: TSH: 0.51 u[IU]/mL (ref 0.35–5.50)

## 2022-01-03 MED ORDER — RYBELSUS 7 MG PO TABS: 7.0000 mg | ORAL_TABLET | Freq: Every day | ORAL | 1 refills | Status: DC

## 2022-01-03 NOTE — Progress Notes (Signed)
Name: Kristina Long  Age/ Sex: 66 y.o., female   MRN/ DOB: [id number], [birth year]     PCP: Clayborn Heron, MD   Reason for Endocrinology Evaluation: Type 2 Diabetes Mellitus  Initial Endocrine Consultative Visit: 04/11/2018    PATIENT IDENTIFIER: [name] is a 66 y.o. female with a past medical history of T2DM, Seasonal allergies and OSA. The patient has followed with Endocrinology clinic since 04/12/2019 for consultative assistance with management of her diabetes.  DIABETIC HISTORY:  [name] was diagnosed with DM in 2013. She has been on Metformin since her diagnosis. She was prescribed Pioglitazone by her PCP in the past  but she never took it after she read the cardiac side effects. Her hemoglobin A1c has ranged from  in 7.3% in 2018, peaking at 10.5 % in 2013.  Januvia was cost prohibitive and by 04/2019 was replaced by Glipizide    Restarted januvia 06/2020    THYROID HISTORY: During evaluation for vertigo a CT Scan showed an incidental finding of  multiple thyroid nodules with the largest 2.1 cm on the right, this prompted a thyroid ultrasound which was done 06/30/2021 revealing MNG with right superior and left inferior nodules meeting FNA criteria.    SUBJECTIVE:   During the last visit (07/12/2021): A1c 6.9% We continued metformin and Glipizide     Today (01/03/2022): [name] is here for a follow up on diabetes management.  She checks glucose once a day. The patient has not had hypoglycemic episodes since the last clinic visit.   Since her last visit she was diagnosed with MNG and two of her nodules meet FNA criteria   Denies local neck symptoms  Denies Nausea , vomiting or diarrhea     HOME ENDOCRINE REGIMEN:  Metformin 500 mg 2 tabs BID  Januvia 100 mg daily  Atorvastatin 10 mg daily     METER DOWNLOAD SUMMARY: Did not bring     DIABETIC COMPLICATIONS: Microvascular complications:    Denies: Neuropathy, retinopathy ,  nephropthay Last eye exam: Completed 06/2020   Macrovascular complications:    Denies: CAD, CVA, PVD   HISTORY:  Past Medical History:  Past Medical History:  Diagnosis Date   Allergy    Diabetes mellitus without complication (HCC)    Past Surgical History: No past surgical history on file. Social History:  reports that she has never smoked. She has never used smokeless tobacco. She reports that she does not drink alcohol and does not use drugs. Family History:  Family History  Problem Relation Age of Onset   Diabetes Father    Diabetes Maternal Grandmother      HOME MEDICATIONS: Allergies as of 01/03/2022   No Active Allergies      Medication List        Accurate as of January 03, 2022  9:45 AM. If you have any questions, ask your nurse or doctor.          atorvastatin 10 MG tablet Commonly known as: LIPITOR Take 1 tablet (10 mg total) by mouth daily.   calcium carbonate 600 MG tablet Commonly known as: OS-CAL Take 600 mg by mouth daily.   lisinopril 2.5 MG tablet Commonly known as: ZESTRIL Take 2.5 mg by mouth daily.   loratadine 10 MG tablet Commonly known as: CLARITIN Take 10 mg by mouth daily as needed.   metFORMIN 500 MG 24 hr tablet Commonly known as: GLUCOPHAGE-XR TAKE 2 TABLETS BY MOUTH  TWICE A DAY   OneTouch Delica Plus Lancet33G Misc USE AS INSTRUCTED TO CHECK BLOOD SUGAR 2 TIMES PER DAY   OneTouch Verio test strip Generic drug: glucose blood Use as instructed to test blood sugar 2 times a day   sitaGLIPtin 100 MG tablet Commonly known as: Januvia Take 1 tablet (100 mg total) by mouth daily.         OBJECTIVE:   Vital Signs: BP 124/80 (BP Location: Left Arm, Patient Position: Sitting, Cuff Size: Large)   Pulse 77   Ht 5\' 10"  (1.778 m)   Wt 201 lb (91.2 kg)   LMP  (LMP Unknown)   SpO2 98%   BMI 28.84 kg/m   Wt Readings from Last 3 Encounters:  01/03/22 201 lb (91.2 kg)  07/12/21 197 lb (89.4 kg)  03/08/21 196 lb (88.9 kg)      Exam: General: Pt appears well and is in NAD  Lungs: Clear with good BS bilat with no rales, rhonchi, or wheezes  Heart: RRR with normal    Extremities: No pretibial edema.  Skin: Normal texture and temperature to palpation.  Neuro: MS is good with appropriate affect, pt is alert and Ox3    DM foot exam: 01/03/2022     The skin of the feet is without sores or ulcerations, but with multiple callous formation on the right plantar surface, right 3rd and 5th toe The pedal pulses are 2+ on right and 2+ on left. The sensation is intact to a screening 5.07, 10 gram monofilament bilaterally    DATA REVIEWED:   Latest Reference Range & Units 01/03/22 10:07  Sodium 135 - 145 mEq/L 140  Potassium 3.5 - 5.1 mEq/L 3.9  Chloride 96 - 112 mEq/L 104  CO2 19 - 32 mEq/L 26  Glucose 70 - 99 mg/dL 03/06/22 (H)  BUN 6 - 23 mg/dL 14  Creatinine 397 - 6.73 mg/dL 4.19  Calcium 8.4 - 3.79 mg/dL 9.6  GFR 02.4 mL/min 83.54  MICROALB/CREAT RATIO 0.0 - 30.0 mg/g 1.1     Latest Reference Range & Units 01/03/22 10:07  TSH 0.35 - 5.50 uIU/mL 0.51  Creatinine,U mg/dL 03/06/22  Microalb, Ur 0.0 - 1.9 mg/dL 1.3  MICROALB/CREAT RATIO 0.0 - 30.0 mg/g 1.1      Latest Reference Range & Units 07/12/21 12:17  Total CHOL/HDL Ratio  3  Cholesterol 0 - 200 mg/dL 07/14/21  HDL Cholesterol 924 mg/dL >26.83  LDL (calc) 0 - 99 mg/dL 55  NonHDL  41.96  Triglycerides 0.0 - 149.0 mg/dL 22.29  VLDL 0.0 - 79.8 mg/dL 92.1        Thyroid ULtrasound 06/30/2021  Estimated total number of nodules >/= 1 cm: 6-10   Number of spongiform nodules >/=  2 cm not described below (TR1): 0   Number of mixed cystic and solid nodules >/= 1.5 cm not described below (TR2): 0   _________________________________________________________   Nodule # 3:   Location: Right; Superior   Maximum size: 1.5 cm; Other 2 dimensions: 1.4 x 0.8 cm   Composition: solid/almost completely solid (2)   Echogenicity: hypoechoic (2)    Shape: not taller-than-wide (0)   Margins: smooth (0)   Echogenic foci: none (0)   ACR TI-RADS total points: 4.   ACR TI-RADS risk category: TR4 (4-6 points).   ACR TI-RADS recommendations:   **Given size (>/= 1.5 cm) and appearance, fine needle aspiration of this moderately suspicious nodule should be considered based on TI-RADS criteria.   _________________________________________________________  Nodule # 4:   Location: Right; Mid   Maximum size: 1.9 cm; Other 2 dimensions: 1.7 x 1.3 cm   Composition: solid/almost completely solid (2)   Echogenicity: isoechoic (1)   Shape: not taller-than-wide (0)   Margins: ill-defined (0)   Echogenic foci: none (0)   ACR TI-RADS total points: 3.   ACR TI-RADS risk category: TR3 (3 points).   ACR TI-RADS recommendations:   *Given size (>/= 1.5 - 2.4 cm) and appearance, a follow-up ultrasound in 1 year should be considered based on TI-RADS criteria.   _________________________________________________________   Nodule # 5:   Location: Right; Inferior   Maximum size: 2.4 cm; Other 2 dimensions: 2.2 x 2.1 cm   Composition: solid/almost completely solid (2)   Echogenicity: isoechoic (1)   Shape: not taller-than-wide (0)   Margins: smooth (0)   Echogenic foci: none (0)   ACR TI-RADS total points: 3.   ACR TI-RADS risk category: TR3 (3 points).   ACR TI-RADS recommendations:   *Given size (>/= 1.5 - 2.4 cm) and appearance, a follow-up ultrasound in 1 year should be considered based on TI-RADS criteria.   _________________________________________________________   Nodule # 6:   Location: Left; Mid   Maximum size: 1.7 cm; Other 2 dimensions: 1.4 x 0.9 cm   Composition: solid/almost completely solid (2)   Echogenicity: isoechoic (1)   Shape: not taller-than-wide (0)   Margins: smooth (0)   Echogenic foci: none (0)   ACR TI-RADS total points: 3.   ACR TI-RADS risk category: TR3 (3 points).   ACR  TI-RADS recommendations:   *Given size (>/= 1.5 - 2.4 cm) and appearance, a follow-up ultrasound in 1 year should be considered based on TI-RADS criteria.   _________________________________________________________   Nodule # 9:   Location: Left; Inferior   Maximum size: 2.1 cm; Other 2 dimensions: 1.7 x 1.6 cm   Composition: solid/almost completely solid (2)   Echogenicity: hypoechoic (2)   Shape: not taller-than-wide (0)   Margins: ill-defined (0)   Echogenic foci: none (0)   ACR TI-RADS total points: 4.   ACR TI-RADS risk category: TR4 (4-6 points).   ACR TI-RADS recommendations:   **Given size (>/= 1.5 cm) and appearance, fine needle aspiration of this moderately suspicious nodule should be considered based on TI-RADS criteria.   _________________________________________________________   There are additional bilateral hypoechoic and isoechoic nodules noted however these are smaller than the nodules documented above.   No hypervascularity.  No regional adenopathy.   IMPRESSION: 1.5 cm right superior TR 4 nodule and 2.1 cm left inferior TR 4 nodule. (Nodules 3 and 9). Both meet criteria for biopsy as above.   Additional nodules warrant follow-up as above.   The above is in keeping with the ACR TI-RADS recommendations - J Am Coll Radiol 2017;14:587-595.      FNA right superior nodule 07/26/2021  Clinical History: Right; Superior 1.5cm; Other 2 dimensions: 1.4 x  0.8cm  FINAL MICROSCOPIC DIAGNOSIS:  - Scant follicular epithelium present (Bethesda category I)   FNA right superior nodule 07/26/2021  Clinical History: Left; Inferior 2.1cm; Other 2 dimensions: 1.7 x 1.6cm  FINAL MICROSCOPIC DIAGNOSIS:  - Scant follicular epithelium present (Bethesda category I)  ASSESSMENT / PLAN / RECOMMENDATIONS:   1) Type 2 Diabetes Mellitus, Optimally controlled, without  complications - Most recent A1c of 8.1 %. Goal A1c < 7.0 %.     -Unfortunately patient  returns with hyperglycemia -I have recommended switching Januvia to GLP-1 agonist due to  glycemic, weight, and cardiovascular benefits -I have cautioned her against GI side effects with Rybelsus, she was provided with 30-day of Rybelsus 3 mg and after that she will increase to 7 mg  MEDICATIONS: -Stop Januvia -Start Rybelsus 3 mg daily, after 1 month increase to 7 mg daily -Continue Metformin 500 mg 2 tablets twice daily   EDUCATION / INSTRUCTIONS: BG monitoring instructions: Patient is instructed to check her blood sugars 1 times a day, fasting Call [facility] if: BG persistently < 70  I reviewed the Rule of 15 for the treatment of hypoglycemia in detail with the patient. Literature supplied.  2) Diabetic complications:  Eye: She does not have known diabetic retinopathy.   Neuro/ Feet: does not have known diabetic peripheral neuropathy. Renal: Patient does not have known baseline CKD.      3) Dyslipidemia:  - LDL was 95 mg/dL in 06/7406 and we started small dose of atorvastatin . Tolerating well  - LDL at goal at 55 mg/dL    Medication   Continue Atorvastatin 10 mg daily   4) Multinodular Goiter :  - No local neck symptoms  - She is S/P  FNA of the right superior and left inferior nodules in January 2023 with scant cellularity.  The patient opted not to proceed with a repeat FNA in 3 months -We will proceed with short-term ultrasound follow-up    F/U in 6 months    Signed electronically by: Lyndle Herrlich, MD  [facility]  [facility] [address] [place] [place], [place] [address] Phone: [phone] FAX: [phone]   CC: Clayborn Heron, MD [address] [place] [address] Phone: [phone]  Fax: [phone]  Return to Endocrinology clinic as below: No future appointments.

## 2022-01-03 NOTE — Patient Instructions (Addendum)
STOP Januvia  Start Rybelsus 3 mg daily  for one month, than increase to 7 mg daily  Continue Metformin 500 mg , two tablets twice a day     HOW TO TREAT LOW BLOOD SUGARS (Blood sugar LESS THAN 70 MG/DL) Please follow the RULE OF 15 for the treatment of hypoglycemia treatment (when your (blood sugars are less than 70 mg/dL)   STEP 1: Take 15 grams of carbohydrates when your blood sugar is low, which includes:  3-4 GLUCOSE TABS  OR 3-4 OZ OF JUICE OR REGULAR SODA OR ONE TUBE OF GLUCOSE GEL    STEP 2: RECHECK blood sugar in 15 MINUTES STEP 3: If your blood sugar is still low at the 15 minute recheck --> then, go back to STEP 1 and treat AGAIN with another 15 grams of carbohydrates.

## 2022-01-04 ENCOUNTER — Ambulatory Visit
Admission: RE | Admit: 2022-01-04 | Discharge: 2022-01-04 | Disposition: A | Discharge status: Home / Self Care | Payer: Medicare Other | Source: Ambulatory Visit | Attending: Internal Medicine | Admitting: Internal Medicine

## 2022-01-04 DIAGNOSIS — E042 Nontoxic multinodular goiter: Secondary | ICD-10-CM

## 2022-01-19 ENCOUNTER — Encounter: Payer: Self-pay | Admitting: Podiatry

## 2022-01-19 ENCOUNTER — Ambulatory Visit (INDEPENDENT_AMBULATORY_CARE_PROVIDER_SITE_OTHER): Payer: Medicare Other | Admitting: Podiatry

## 2022-01-19 DIAGNOSIS — Q828 Other specified congenital malformations of skin: Secondary | ICD-10-CM

## 2022-01-19 DIAGNOSIS — E1140 Type 2 diabetes mellitus with diabetic neuropathy, unspecified: Secondary | ICD-10-CM

## 2022-01-19 DIAGNOSIS — E1149 Type 2 diabetes mellitus with other diabetic neurological complication: Secondary | ICD-10-CM

## 2022-01-19 NOTE — Progress Notes (Signed)
Subjective:   Patient ID: Kristina Long, female   DOB: 66 y.o.   MRN: [id number]   HPI Patient presents with severe lesion formation bilateral long-term diabetic   ROS      Objective:  Physical Exam  Vascular status intact neurologically diminishment sharp dull vibratory consistent with diabetic neuropathy with severe lesion formation third digit right and some third metatarsal both feet     Assessment:  Chronic keratotic lesions with pain in patient who is a diabetic with neuropathy     Plan:  At risk pain who has sharp sterile debridement of lesions bilateral no angiogenic bleeding reappoint for routine care

## 2022-01-22 ENCOUNTER — Other Ambulatory Visit: Payer: Self-pay | Admitting: Internal Medicine

## 2022-02-27 ENCOUNTER — Other Ambulatory Visit: Payer: Self-pay | Admitting: Internal Medicine

## 2022-03-14 ENCOUNTER — Telehealth: Payer: Self-pay

## 2022-03-14 NOTE — Telephone Encounter (Signed)
Patient states that she has been having issues with dry mouth and thinks it maybe from the Rybelsus. Patient states that her sugar have been great but the dryness is getting worse. Patient states that she has been taking Rybelsus for about 2 months. Patient has reduced the Metformin to 1 tablet at dinner daily around 5pm. She has stopped the other doses because her sugar has been managed with dinner dose.

## 2022-03-14 NOTE — Telephone Encounter (Signed)
Patient would like to just continue to change her diet and not eat after 5pm. Patient will take the 1 metformin at dinner and would like to do Januvia. She doesn't want to take anymore than 1 tablet of Metformin at dinner.

## 2022-03-15 ENCOUNTER — Other Ambulatory Visit: Payer: Self-pay | Admitting: Internal Medicine

## 2022-03-15 DIAGNOSIS — E1165 Type 2 diabetes mellitus with hyperglycemia: Secondary | ICD-10-CM

## 2022-03-15 MED ORDER — SITAGLIPTIN PHOSPHATE 100 MG PO TABS: 100.0000 mg | ORAL_TABLET | Freq: Every day | ORAL | 2 refills | Status: DC

## 2022-03-15 NOTE — Telephone Encounter (Signed)
Januvia sent.

## 2022-03-20 ENCOUNTER — Other Ambulatory Visit: Payer: Self-pay | Admitting: Internal Medicine

## 2022-06-22 ENCOUNTER — Ambulatory Visit: Payer: Medicare Other | Admitting: Podiatry

## 2022-06-25 ENCOUNTER — Other Ambulatory Visit: Payer: Self-pay | Admitting: Internal Medicine

## 2022-07-06 ENCOUNTER — Ambulatory Visit (INDEPENDENT_AMBULATORY_CARE_PROVIDER_SITE_OTHER): Payer: Medicare Other | Admitting: Podiatry

## 2022-07-06 ENCOUNTER — Encounter: Payer: Self-pay | Admitting: Podiatry

## 2022-07-06 DIAGNOSIS — E1140 Type 2 diabetes mellitus with diabetic neuropathy, unspecified: Secondary | ICD-10-CM | POA: Diagnosis not present

## 2022-07-06 DIAGNOSIS — Q828 Other specified congenital malformations of skin: Secondary | ICD-10-CM | POA: Diagnosis not present

## 2022-07-06 DIAGNOSIS — E1149 Type 2 diabetes mellitus with other diabetic neurological complication: Secondary | ICD-10-CM

## 2022-07-06 NOTE — Progress Notes (Signed)
Subjective:   Patient ID: Kristina Long, female   DOB: 67 y.o.   MRN: [id number]   HPI Patient presents with severe lesion formation bilateral painful that are hard for her to walk on and states that her diabetes is the same   ROS      Objective:  Physical Exam  Neurovascular status intact severe keratotic lesion fourth digit right subfifth first metatarsals both feet painful     Assessment:  Chronic lesion formation with long-term diabetes bilateral     Plan:  Sharp sterile debridement of all lesions no angiogenic bleeding reappoint routine care

## 2022-08-05 IMAGING — US US THYROID
1 series · 12 of 25 positions shown · non-contrast
Comparison: 04/12/2021

CLINICAL DATA: Thyroid nodules by CT 04/12/2021, largest in right
thyroid measuring 2.1 cm

EXAM:
THYROID ULTRASOUND
TECHNIQUE: Ultrasound examination of the thyroid gland and adjacent soft
tissues was performed.

[Series 1: us thyroid · 0.09mm/px · 12 of 78 slices shown]
[im 4/78]
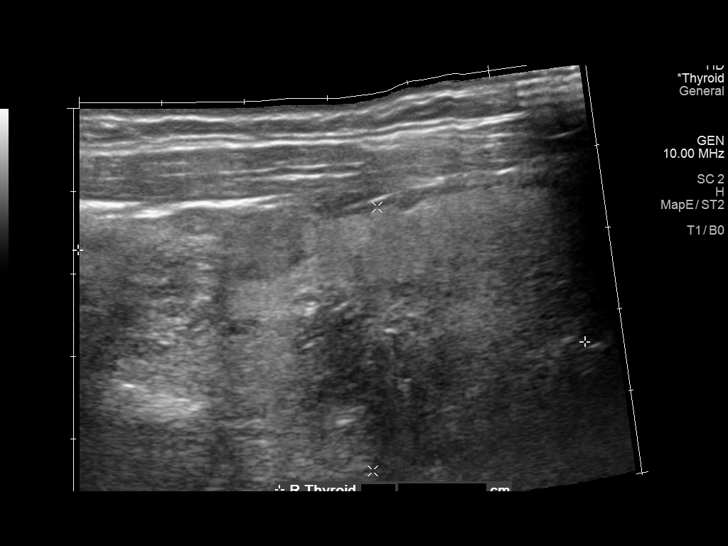
[im 10/78]
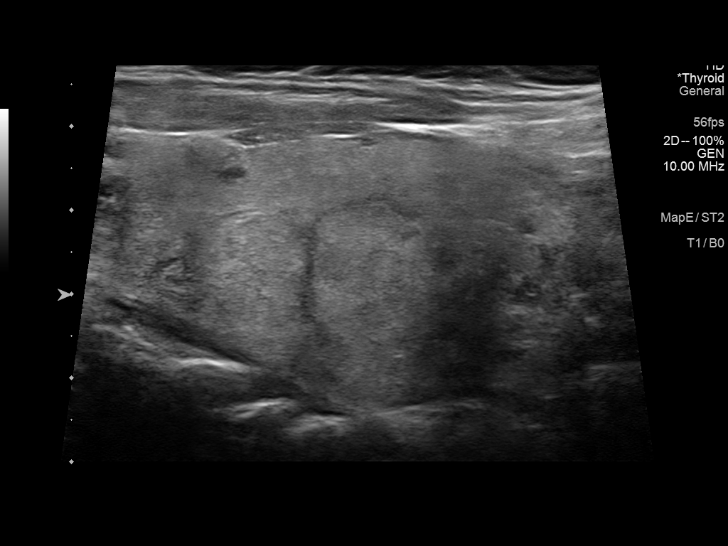
[im 17/78]
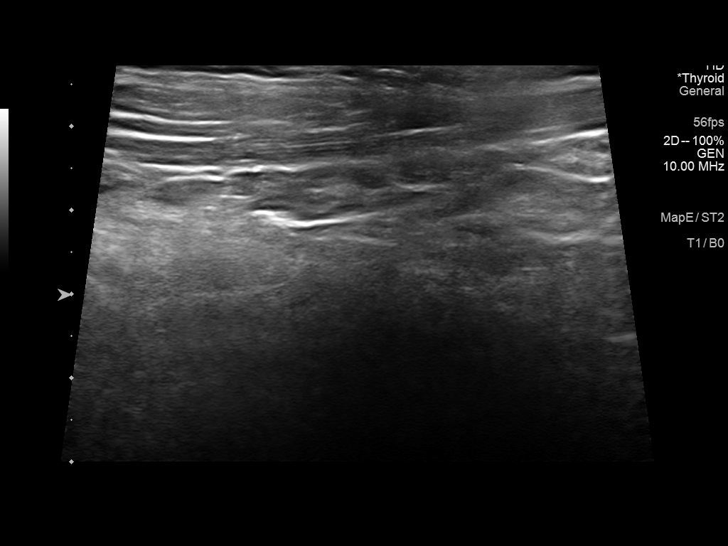
[im 23/78]
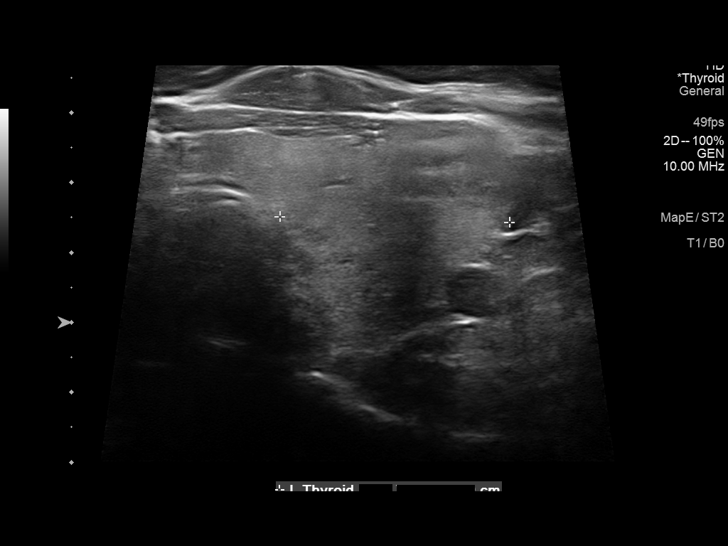
[im 29/78]
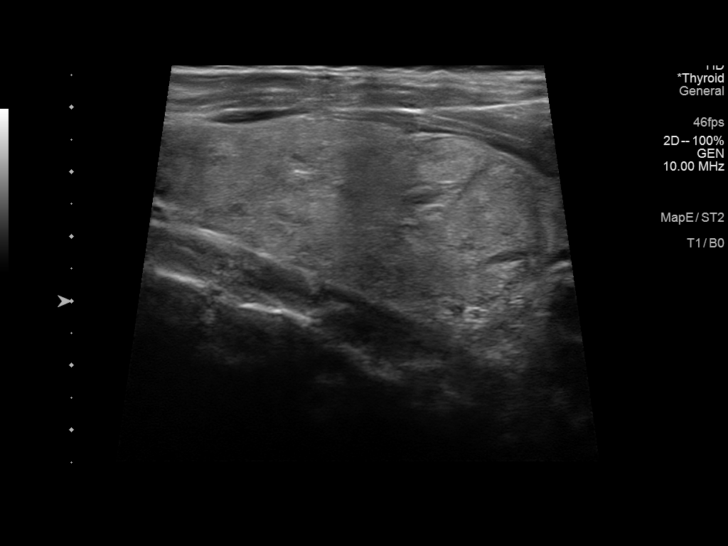
[im 36/78]
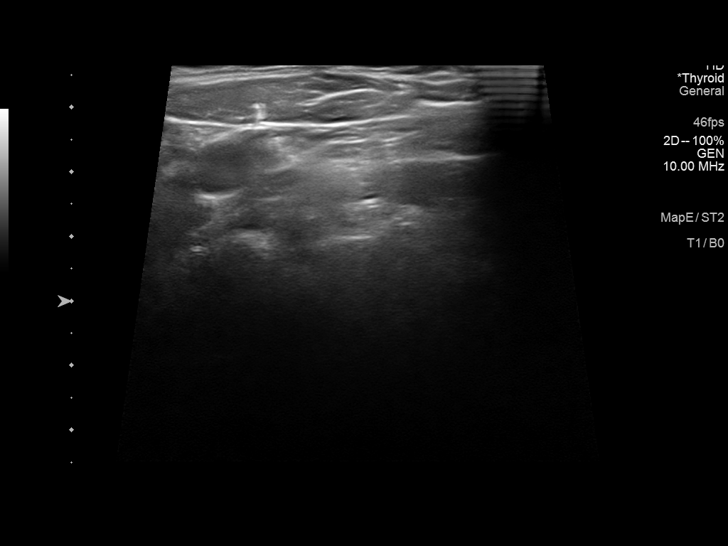
[im 42/78]
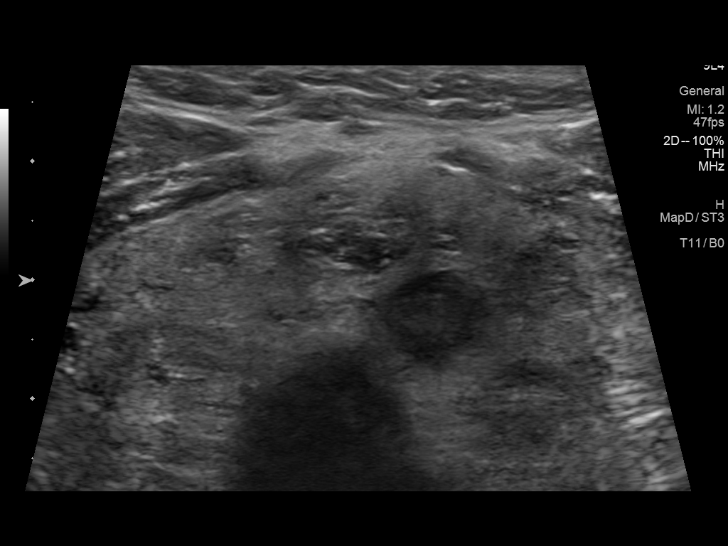
[im 49/78]
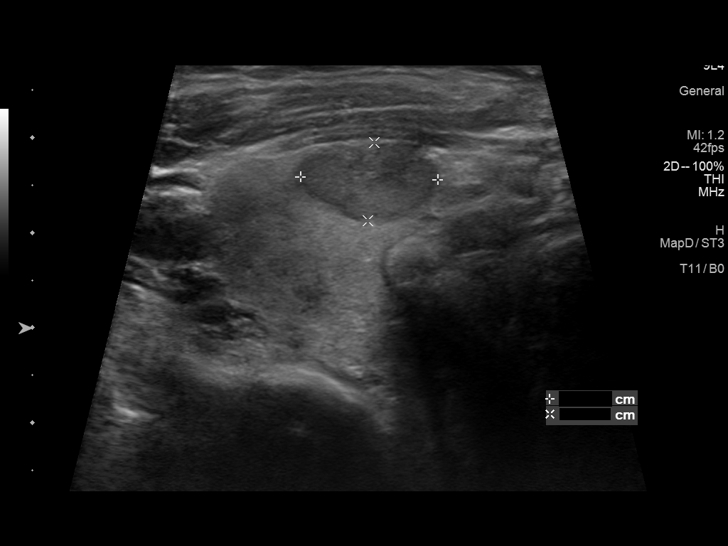
[im 55/78]
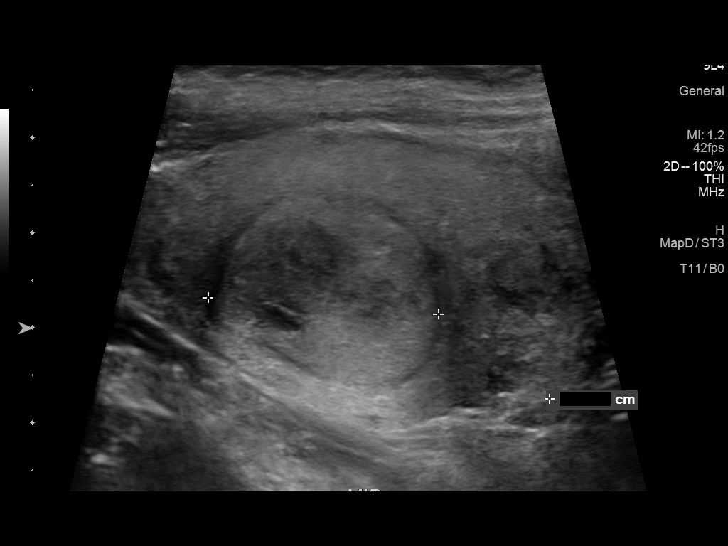
[im 61/78]
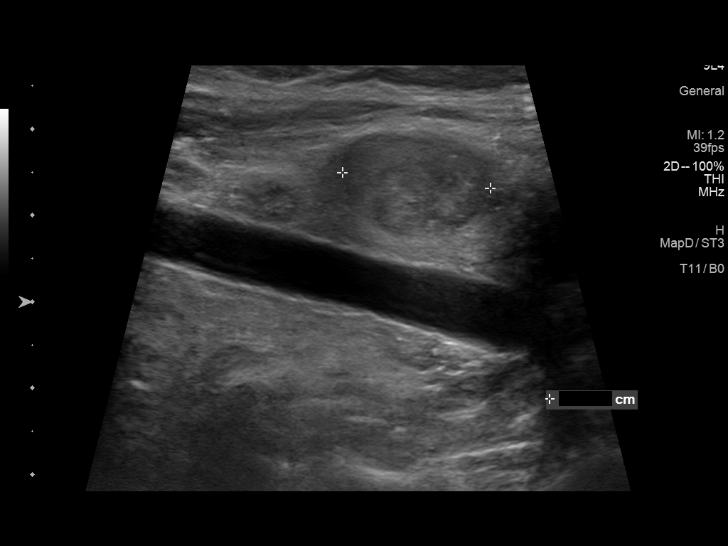
[im 68/78]
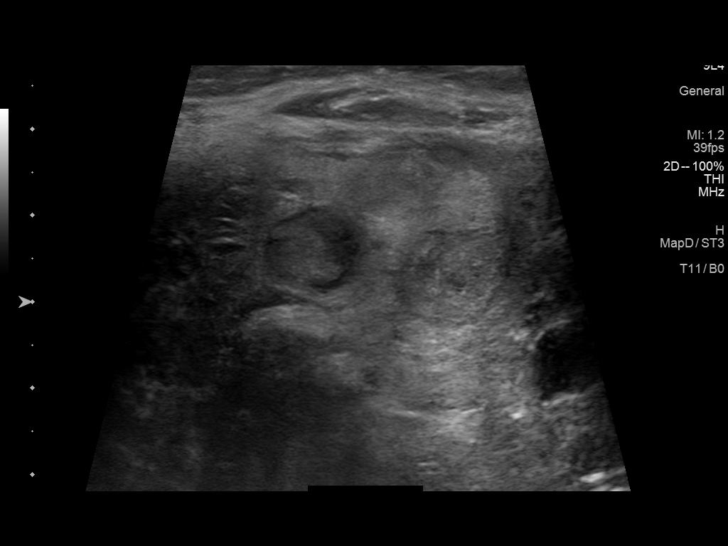
[im 74/78]
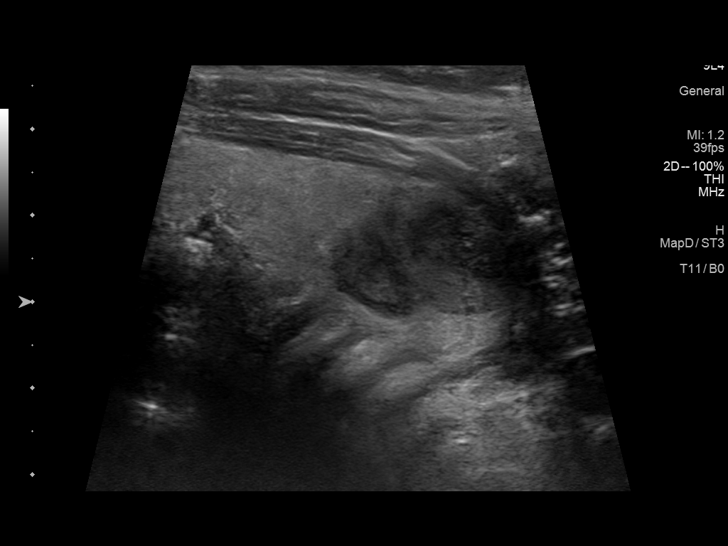

[12 of 25 positions shown; findings below may reference images not displayed]

FINDINGS: Parenchymal Echotexture: Markedly heterogenous

Isthmus: 9 mm

Right lobe: 6.2 x 3.2 x 3.0 cm

Left lobe: 7.3 x 3.1 x 3.3 cm

_________________________________________________________

Estimated total number of nodules >/= 1 cm: 6-10

Number of spongiform nodules >/=  2 cm not described below (TR1): 0

Number of mixed cystic and solid nodules >/= 1.5 cm not described
below (TR2): 0

_________________________________________________________

Nodule # 3:

Location: Right; Superior

Maximum size: 1.5 cm; Other 2 dimensions: 1.4 x 0.8 cm

Composition: solid/almost completely solid (2)

Echogenicity: hypoechoic (2)

Shape: not taller-than-wide (0)

Margins: smooth (0)

Echogenic foci: none (0)

ACR TI-RADS total points: 4.

ACR TI-RADS risk category: TR4 (4-6 points).

ACR TI-RADS recommendations:

**Given size (>/= 1.5 cm) and appearance, fine needle aspiration of
this moderately suspicious nodule should be considered based on
TI-RADS criteria.

_________________________________________________________

Nodule # 4:

Location: Right; Mid

Maximum size: 1.9 cm; Other 2 dimensions: 1.7 x 1.3 cm

Composition: solid/almost completely solid (2)

Echogenicity: isoechoic (1)

Shape: not taller-than-wide (0)

Margins: ill-defined (0)

Echogenic foci: none (0)

ACR TI-RADS total points: 3.

ACR TI-RADS risk category: TR3 (3 points).

ACR TI-RADS recommendations:

*Given size (>/= 1.5 - 2.4 cm) and appearance, a follow-up
ultrasound in 1 year should be considered based on TI-RADS criteria.

_________________________________________________________

Nodule # 5:

Location: Right; Inferior

Maximum size: 2.4 cm; Other 2 dimensions: 2.2 x 2.1 cm

Composition: solid/almost completely solid (2)

Echogenicity: isoechoic (1)

Shape: not taller-than-wide (0)

Margins: smooth (0)

Echogenic foci: none (0)

ACR TI-RADS total points: 3.

ACR TI-RADS risk category: TR3 (3 points).

ACR TI-RADS recommendations:

*Given size (>/= 1.5 - 2.4 cm) and appearance, a follow-up
ultrasound in 1 year should be considered based on TI-RADS criteria.

_________________________________________________________

Nodule # 6:

Location: Left; Mid

Maximum size: 1.7 cm; Other 2 dimensions: 1.4 x 0.9 cm

Composition: solid/almost completely solid (2)

Echogenicity: isoechoic (1)

Shape: not taller-than-wide (0)

Margins: smooth (0)

Echogenic foci: none (0)

ACR TI-RADS total points: 3.

ACR TI-RADS risk category: TR3 (3 points).

ACR TI-RADS recommendations:

*Given size (>/= 1.5 - 2.4 cm) and appearance, a follow-up
ultrasound in 1 year should be considered based on TI-RADS criteria.

_________________________________________________________

Nodule # 9:

Location: Left; Inferior

Maximum size: 2.1 cm; Other 2 dimensions: 1.7 x 1.6 cm

Composition: solid/almost completely solid (2)

Echogenicity: hypoechoic (2)

Shape: not taller-than-wide (0)

Margins: ill-defined (0)

Echogenic foci: none (0)

ACR TI-RADS total points: 4.

ACR TI-RADS risk category: TR4 (4-6 points).

ACR TI-RADS recommendations:

**Given size (>/= 1.5 cm) and appearance, fine needle aspiration of
this moderately suspicious nodule should be considered based on
TI-RADS criteria.

_________________________________________________________

There are additional bilateral hypoechoic and isoechoic nodules
noted however these are smaller than the nodules documented above.

No hypervascularity.  No regional adenopathy.
IMPRESSION: 1.5 cm right superior TR 4 nodule and 2.1 cm left inferior TR 4
nodule. (Nodules 3 and 9). Both meet criteria for biopsy as above.

Additional nodules warrant follow-up as above.

The above is in keeping with the ACR TI-RADS recommendations - [HOSPITAL] 5442;[DATE].

## 2022-08-23 ENCOUNTER — Other Ambulatory Visit: Payer: Self-pay

## 2022-08-23 MED ORDER — METFORMIN HCL ER 500 MG PO TB24: ORAL_TABLET | ORAL | 1 refills | Status: DC

## 2022-08-23 NOTE — Telephone Encounter (Signed)
Patient states that there was some misunderstanding about the Metformin and she is taking 2 tabs in afternoon and 2 tabs at night. Script was updated and sent to pharmacy.

## 2022-08-31 IMAGING — US US FNA BIOPSY THYROID 1ST LESION
1 series · 13 of 25 positions shown · non-contrast
Comparison: US 06/30/21

MEDICATIONS:
None

COMPLICATIONS:
None immediate.

INDICATION: Indeterminate thyroid nodules

EXAM:
ULTRASOUND GUIDED FINE NEEDLE ASPIRATION OF INDETERMINATE THYROID
NODULE
TECHNIQUE: Informed written consent was obtained from the patient after a
discussion of the risks, benefits and alternatives to treatment.
Questions regarding the procedure were encouraged and answered. A
timeout was performed prior to the initiation of the procedure.

[Series 1: us fna biopsy thyroid 1st lesion · 0.06mm/px · 28 acquisitions, 13 frames shown]
[im 1/28]
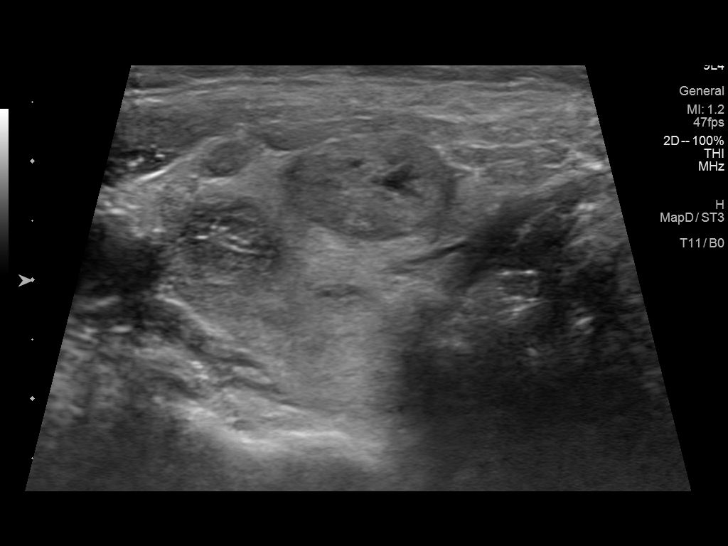
[im 3/28]
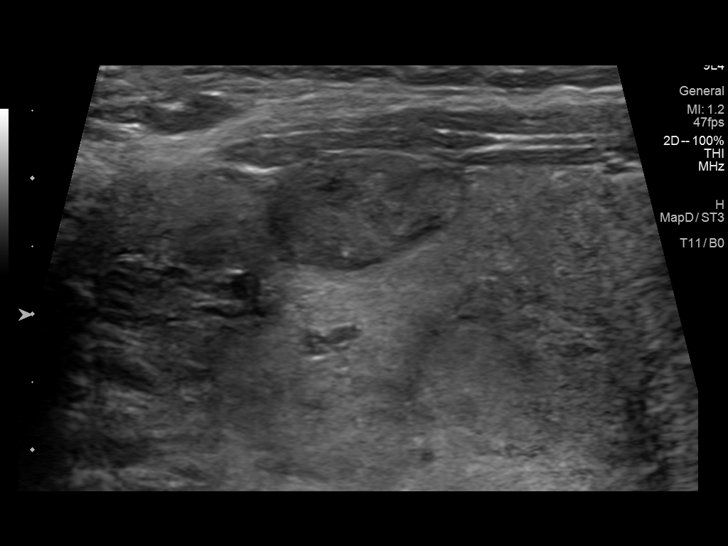
[im 5/28]
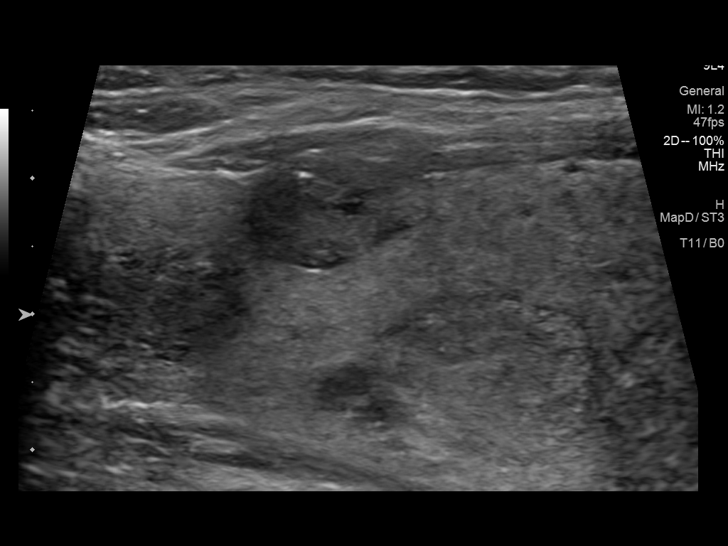
[im 7/28]
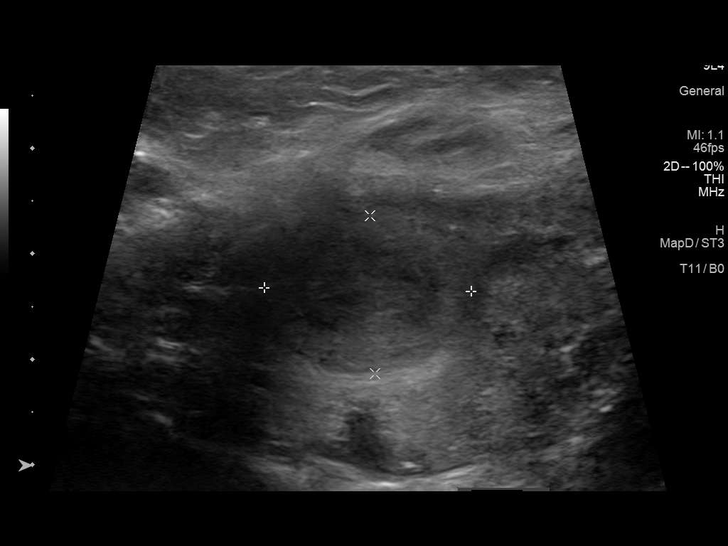
[im 10/28]
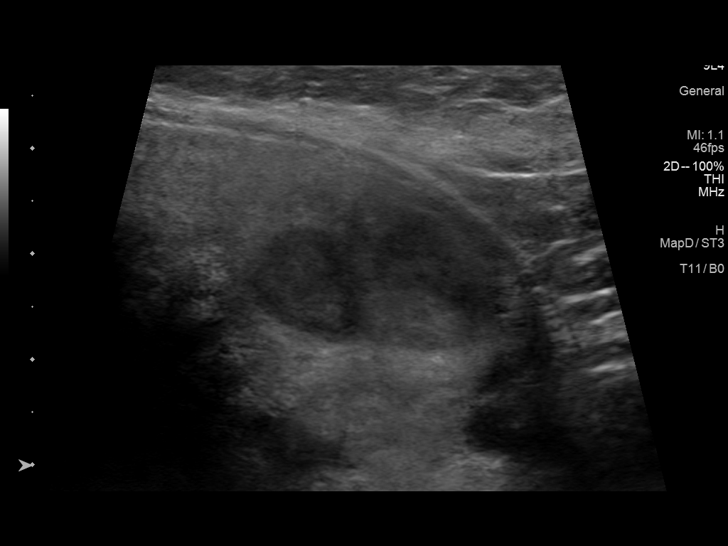
[im 12/28]
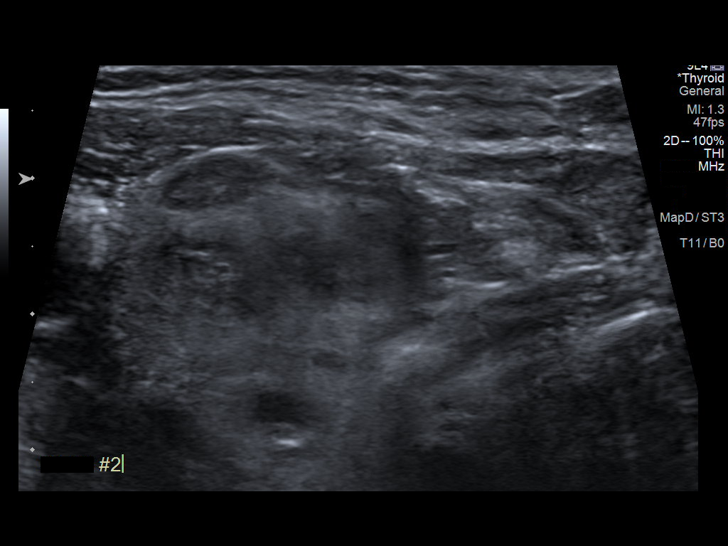
[im 14/28]
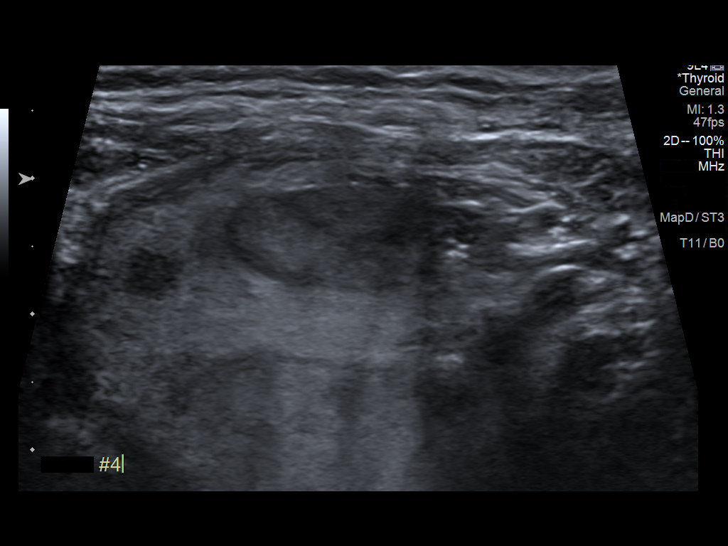
[im 16/28]
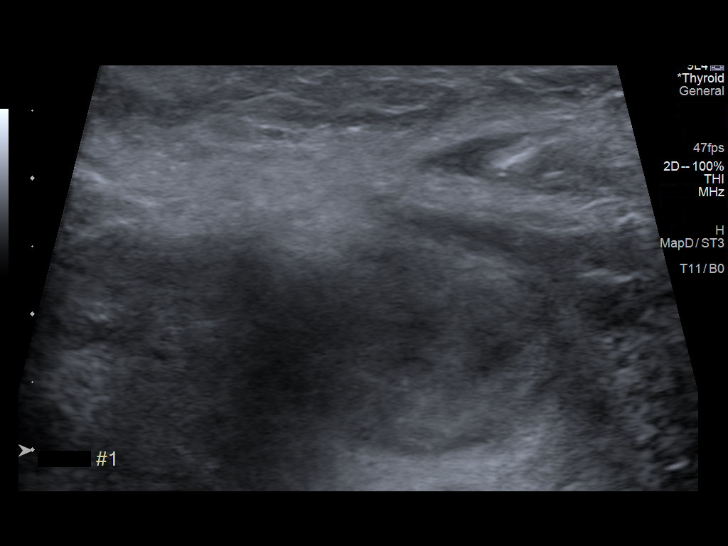
[im 19/28]
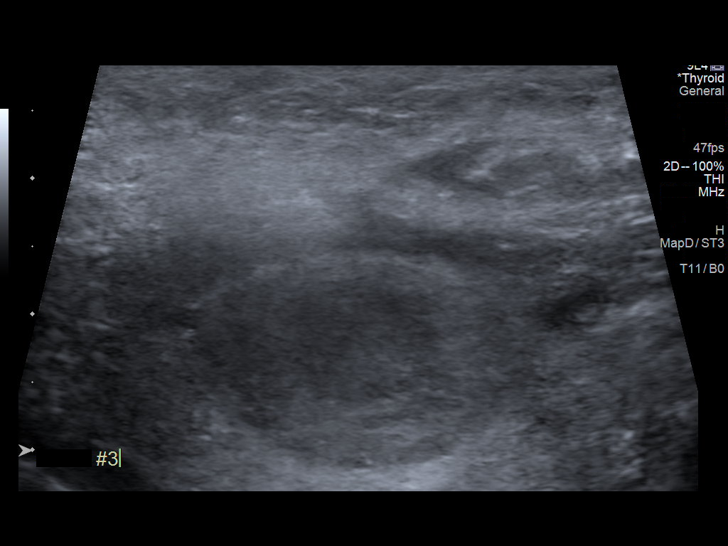
[im 21/28]
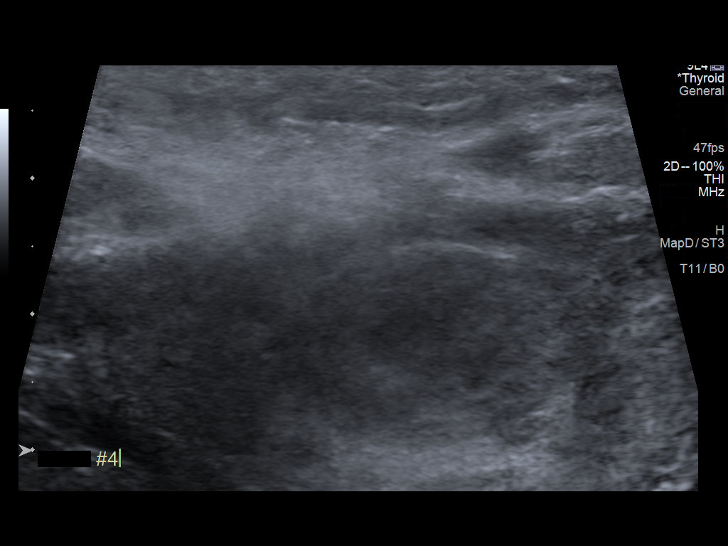
[im 23/28]
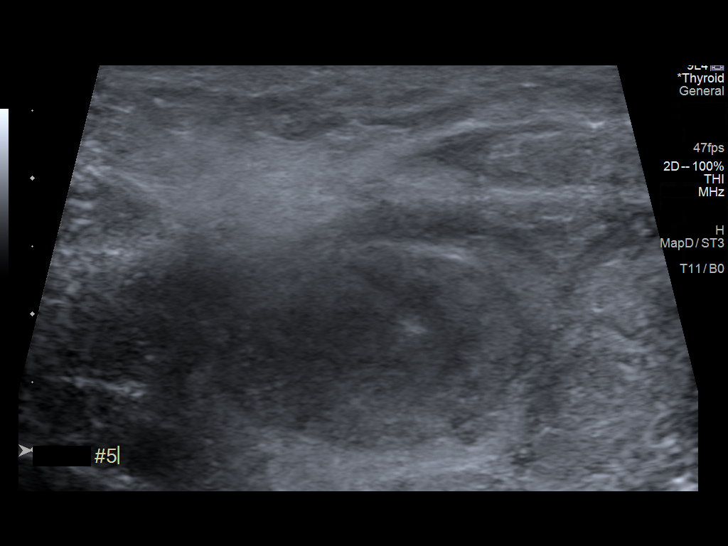
[im 25/28]
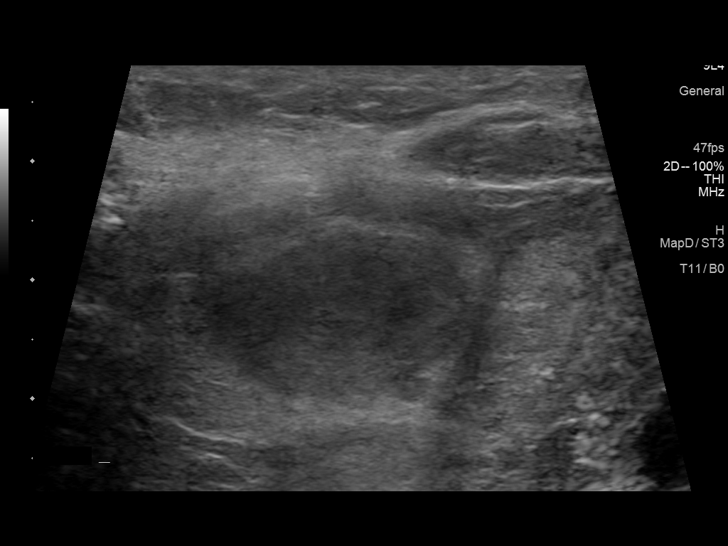
[im 28/28]
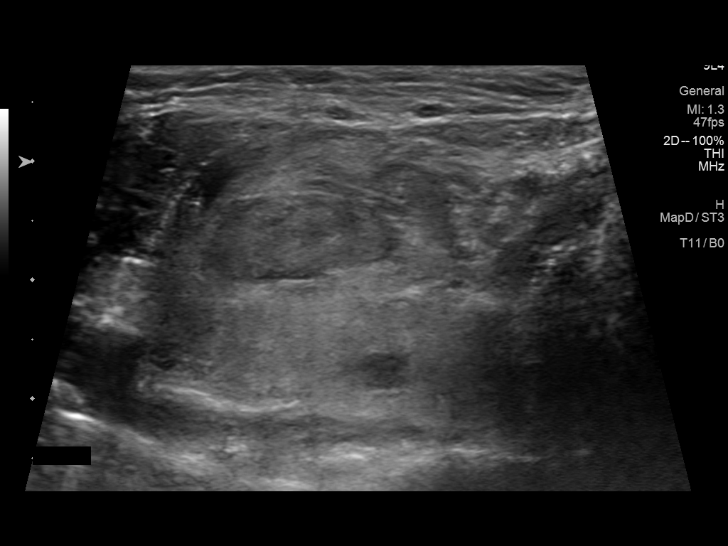

[13 of 25 positions shown; findings below may reference images not displayed]

Pre-procedural ultrasound scanning demonstrated unchanged size and
appearance of the indeterminate nodules within the left and right
thyroid lobes

The procedure was planned. The neck was prepped in the usual sterile
fashion, and a sterile drape was applied covering the operative
field. A timeout was performed prior to the initiation of the
procedure. Local anesthesia was provided with 1% lidocaine.

Under direct ultrasound guidance, 5 FNA biopsies were performed of
the left inferior lobe nodule with a 27 gauge needle. Multiple
ultrasound images were saved for procedural documentation purposes.
The samples were prepared and submitted to pathology. Two of these
specimens were reserved for [facility].

Under direct ultrasound guidance, 5 FNA biopsies were performed of
the left inferior lobe nodule with a 27 gauge needle. Multiple
ultrasound images were saved for procedural documentation purposes.
The samples were prepared and submitted to pathology. Two of these
specimens were reserved for [facility].

Limited post procedural scanning was negative for hematoma or
additional complication. Dressings were placed. The patient
tolerated the above procedures procedure well without immediate
postprocedural complication.
FINDINGS: Nodule reference number based on prior diagnostic ultrasound: 3

Maximum size: 1.5 cm

Location: Right; Superior

ACR TI-RADS risk category: TR4 (4-6 points)

Reason for biopsy: meets ACR TI-RADS criteria

_________________________________________________________

Nodule reference number based on prior diagnostic ultrasound: 9

Maximum size: 2.1 cm

Location: Left; Inferior

ACR TI-RADS risk category: TR4 (4-6 points)

Reason for biopsy: meets ACR TI-RADS criteria

Ultrasound imaging confirms appropriate placement of the needles
within the thyroid nodule.
IMPRESSION: 1. Technically successful ultrasound guided fine needle aspiration
of right superior thyroid nodule, #3
2. Technically successful ultrasound guided fine needle aspiration
of left inferior thyroid nodule, #9

Performed and read by Mula, Kruz

## 2022-08-31 IMAGING — US US FNA BIOPSY THYROID 1ST LESION
1 series · 13 of 25 positions shown · non-contrast
Comparison: US 06/30/21

MEDICATIONS:
None

COMPLICATIONS:
None immediate.

INDICATION: Indeterminate thyroid nodules

EXAM:
ULTRASOUND GUIDED FINE NEEDLE ASPIRATION OF INDETERMINATE THYROID
NODULE
TECHNIQUE: Informed written consent was obtained from the patient after a
discussion of the risks, benefits and alternatives to treatment.
Questions regarding the procedure were encouraged and answered. A
timeout was performed prior to the initiation of the procedure.

[Series 1: us fna biopsy thyroid 1st lesion · 0.06mm/px · 28 acquisitions, 13 frames shown]
[im 1/28]
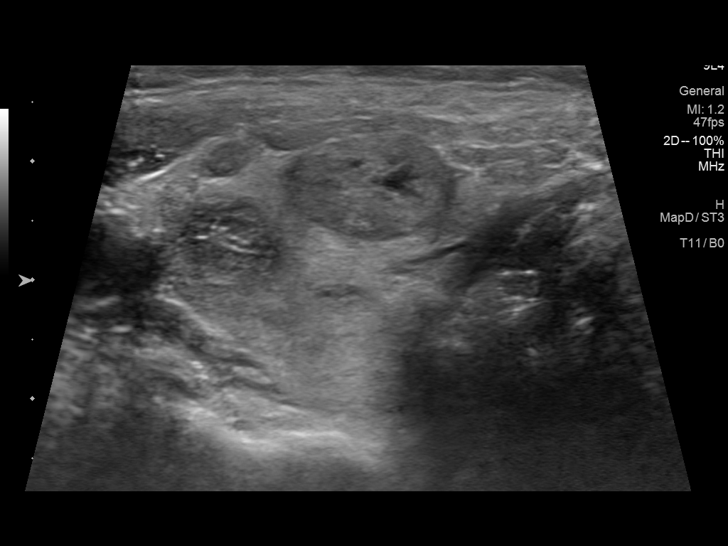
[im 3/28]
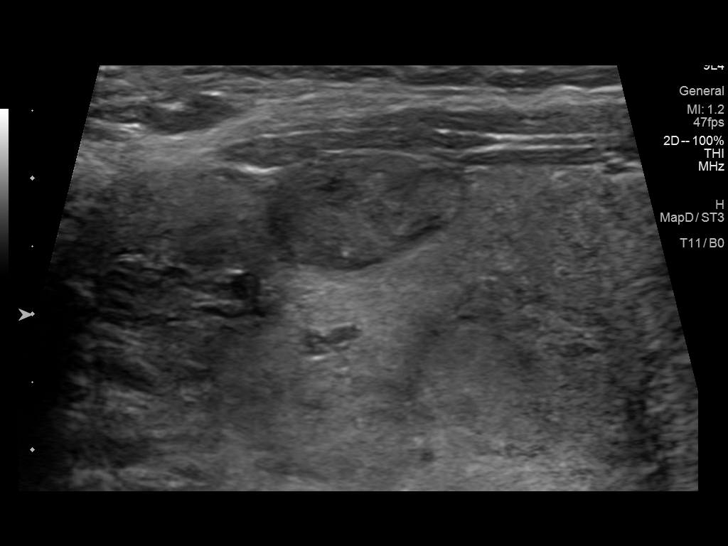
[im 5/28]
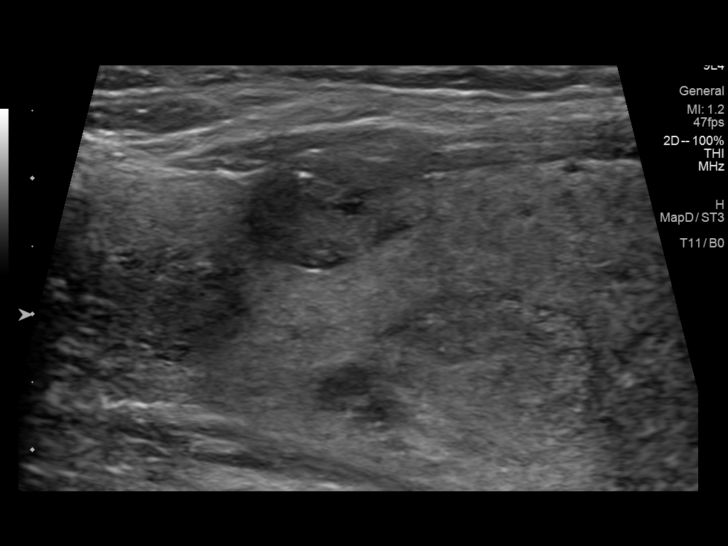
[im 7/28]
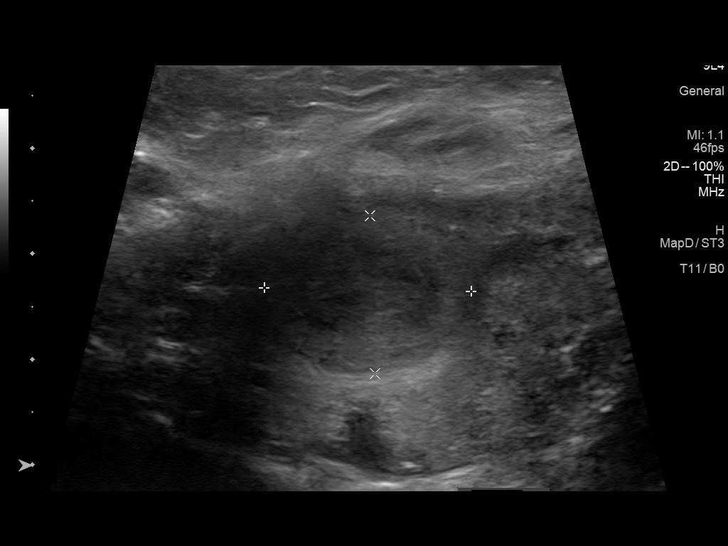
[im 10/28]
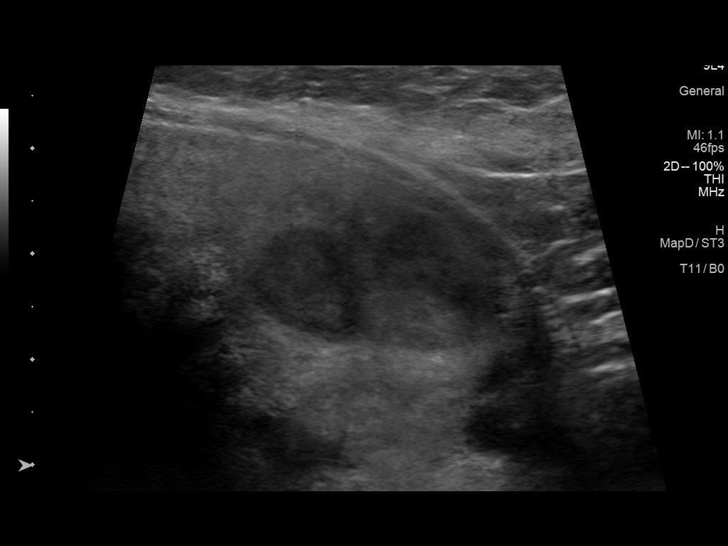
[im 12/28]
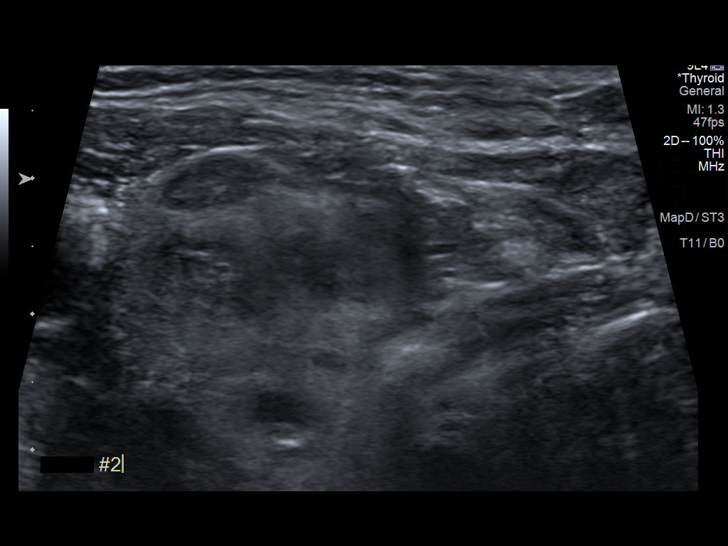
[im 14/28]
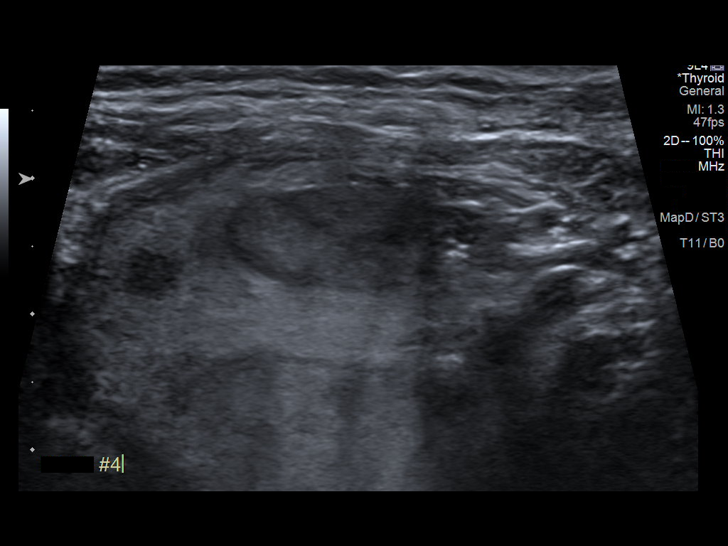
[im 16/28]
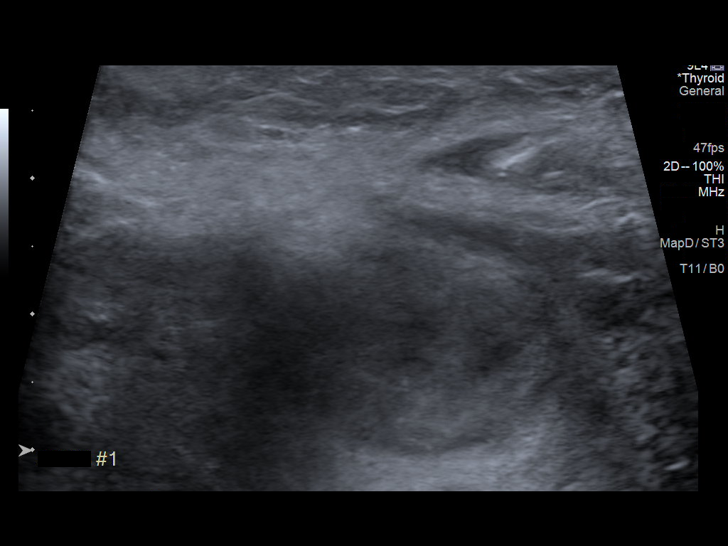
[im 19/28]
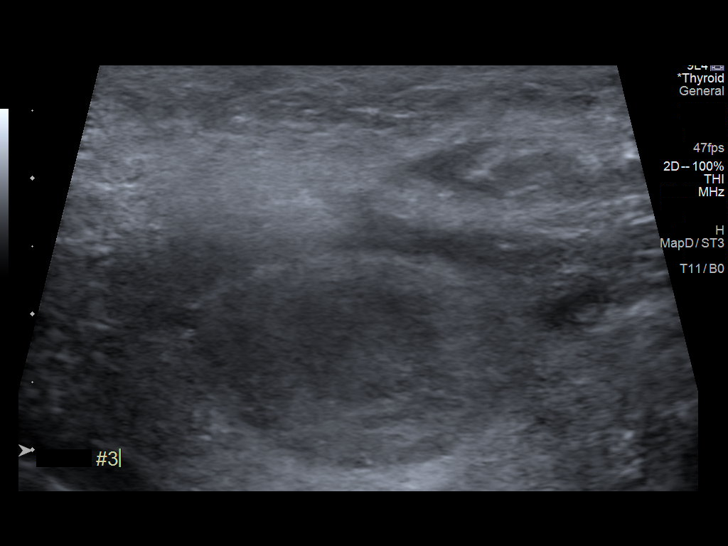
[im 21/28]
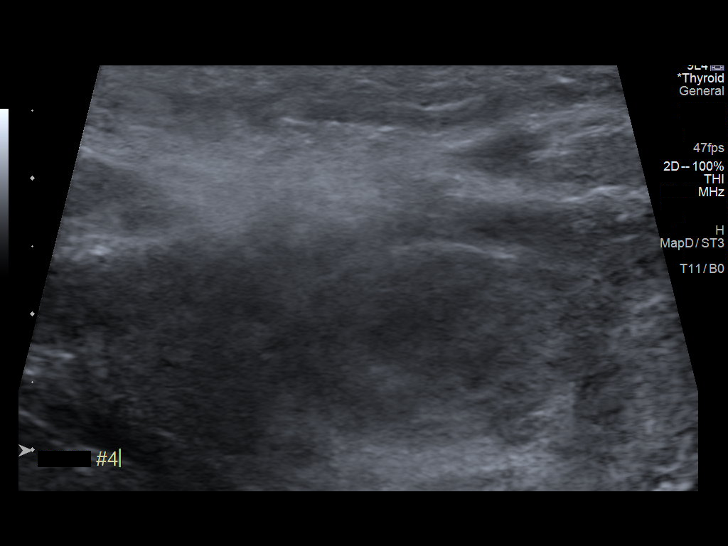
[im 23/28]
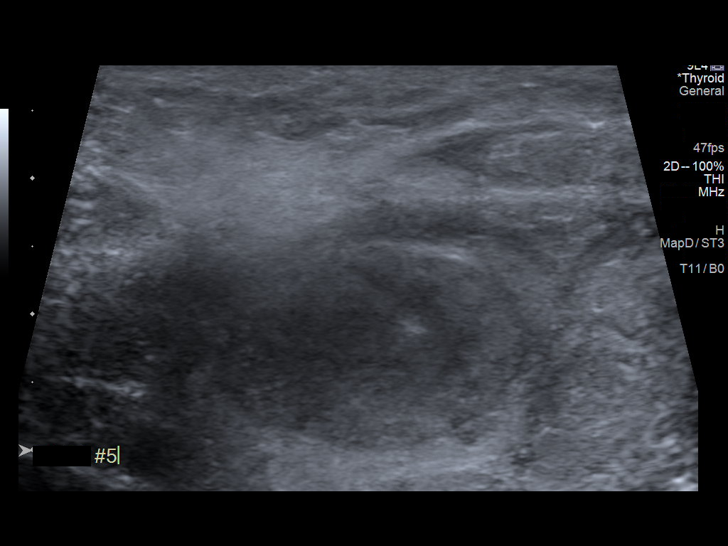
[im 25/28]
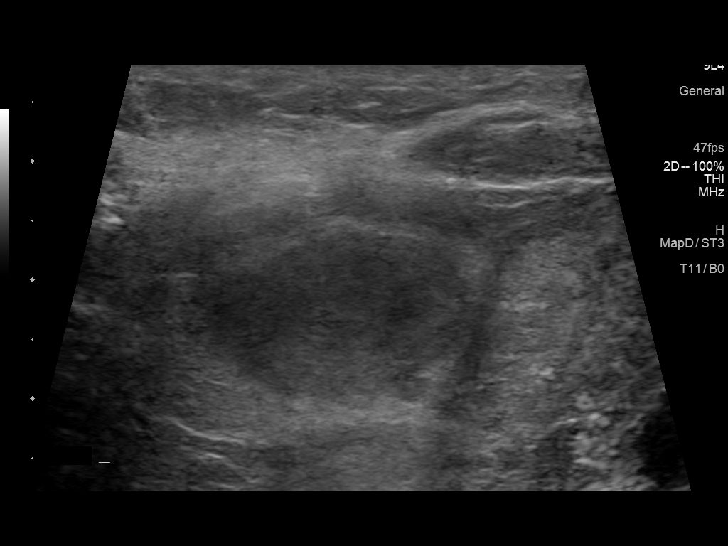
[im 28/28]
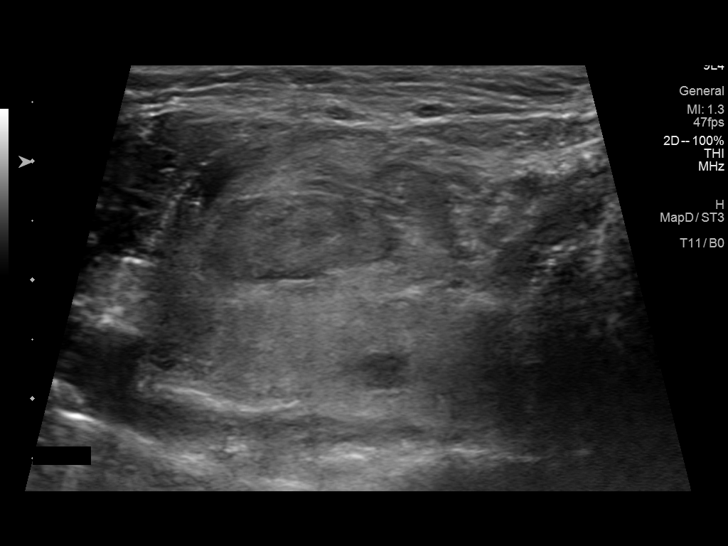

[13 of 25 positions shown; findings below may reference images not displayed]

Pre-procedural ultrasound scanning demonstrated unchanged size and
appearance of the indeterminate nodules within the left and right
thyroid lobes

The procedure was planned. The neck was prepped in the usual sterile
fashion, and a sterile drape was applied covering the operative
field. A timeout was performed prior to the initiation of the
procedure. Local anesthesia was provided with 1% lidocaine.

Under direct ultrasound guidance, 5 FNA biopsies were performed of
the left inferior lobe nodule with a 27 gauge needle. Multiple
ultrasound images were saved for procedural documentation purposes.
The samples were prepared and submitted to pathology. Two of these
specimens were reserved for [facility].

Under direct ultrasound guidance, 5 FNA biopsies were performed of
the left inferior lobe nodule with a 27 gauge needle. Multiple
ultrasound images were saved for procedural documentation purposes.
The samples were prepared and submitted to pathology. Two of these
specimens were reserved for [facility].

Limited post procedural scanning was negative for hematoma or
additional complication. Dressings were placed. The patient
tolerated the above procedures procedure well without immediate
postprocedural complication.
FINDINGS: Nodule reference number based on prior diagnostic ultrasound: 3

Maximum size: 1.5 cm

Location: Right; Superior

ACR TI-RADS risk category: TR4 (4-6 points)

Reason for biopsy: meets ACR TI-RADS criteria

_________________________________________________________

Nodule reference number based on prior diagnostic ultrasound: 9

Maximum size: 2.1 cm

Location: Left; Inferior

ACR TI-RADS risk category: TR4 (4-6 points)

Reason for biopsy: meets ACR TI-RADS criteria

Ultrasound imaging confirms appropriate placement of the needles
within the thyroid nodule.
IMPRESSION: 1. Technically successful ultrasound guided fine needle aspiration
of right superior thyroid nodule, #3
2. Technically successful ultrasound guided fine needle aspiration
of left inferior thyroid nodule, #9

Performed and read by Mula, Kruz

## 2022-09-27 ENCOUNTER — Other Ambulatory Visit: Payer: Self-pay | Admitting: Internal Medicine

## 2022-10-20 ENCOUNTER — Ambulatory Visit (INDEPENDENT_AMBULATORY_CARE_PROVIDER_SITE_OTHER): Payer: Medicare Other | Admitting: Podiatry

## 2022-10-20 ENCOUNTER — Encounter: Payer: Self-pay | Admitting: Podiatry

## 2022-10-20 DIAGNOSIS — Q828 Other specified congenital malformations of skin: Secondary | ICD-10-CM | POA: Diagnosis not present

## 2022-10-20 DIAGNOSIS — E1149 Type 2 diabetes mellitus with other diabetic neurological complication: Secondary | ICD-10-CM | POA: Diagnosis not present

## 2022-10-20 DIAGNOSIS — E1140 Type 2 diabetes mellitus with diabetic neuropathy, unspecified: Secondary | ICD-10-CM

## 2022-10-23 NOTE — Progress Notes (Signed)
Subjective:   Patient ID: Kristina Long, female   DOB: 67 y.o.   MRN: [id number]   HPI Long-term diabetic with moderate neuropathic condition with severe callus formation bilateral plantar painful   ROS      Objective:  Physical Exam  Neuro vas scaler status intact chronic keratotic lesion bilateral painful that she cannot take care of with high risk factors with diabetic neuropathy     Assessment:  Porokeratotic lesion formation secondary to pressure from bone structure     Plan:  H&P full debridement accomplished no angiogenic bleeding reappoint routine care

## 2022-11-10 ENCOUNTER — Other Ambulatory Visit: Payer: Self-pay | Admitting: Internal Medicine

## 2023-01-07 ENCOUNTER — Other Ambulatory Visit: Payer: Self-pay | Admitting: Internal Medicine

## 2023-02-06 ENCOUNTER — Ambulatory Visit: Payer: Medicare Other | Admitting: Internal Medicine

## 2023-02-06 ENCOUNTER — Encounter: Payer: Self-pay | Admitting: Internal Medicine

## 2023-02-06 VITALS — BP 130/84 | HR 74 | Ht 70.0 in | Wt 207.0 lb

## 2023-02-06 DIAGNOSIS — E042 Nontoxic multinodular goiter: Secondary | ICD-10-CM

## 2023-02-06 DIAGNOSIS — E785 Hyperlipidemia, unspecified: Secondary | ICD-10-CM

## 2023-02-06 DIAGNOSIS — E1165 Type 2 diabetes mellitus with hyperglycemia: Secondary | ICD-10-CM

## 2023-02-06 DIAGNOSIS — Z794 Long term (current) use of insulin: Secondary | ICD-10-CM

## 2023-02-06 LAB — POCT GLYCOSYLATED HEMOGLOBIN (HGB A1C): Hemoglobin A1C: 7.7 % — AB (ref 4.0–5.6)

## 2023-02-06 LAB — POCT GLUCOSE (DEVICE FOR HOME USE): Glucose Fasting, POC: 157 mg/dL — AB (ref 70–99)

## 2023-02-06 MED ORDER — GLIMEPIRIDE 1 MG PO TABS: 1.0000 mg | ORAL_TABLET | Freq: Every day | ORAL | 2 refills | Status: DC

## 2023-02-06 MED ORDER — ATORVASTATIN CALCIUM 10 MG PO TABS: 10.0000 mg | ORAL_TABLET | Freq: Every day | ORAL | 3 refills | Status: DC

## 2023-02-06 MED ORDER — METFORMIN HCL ER 500 MG PO TB24: 1000.0000 mg | ORAL_TABLET | Freq: Two times a day (BID) | ORAL | 2 refills | Status: DC

## 2023-02-06 MED ORDER — SITAGLIPTIN PHOSPHATE 100 MG PO TABS: 100.0000 mg | ORAL_TABLET | Freq: Every day | ORAL | 2 refills | Status: DC

## 2023-02-06 NOTE — Patient Instructions (Signed)
Start glimepiride 1 mg, 1 tablet before breakfast Continue Januvia 100 mg, 1 tablet daily Continue Metformin 500 mg , two tablets twice a day     HOW TO TREAT LOW BLOOD SUGARS (Blood sugar LESS THAN 70 MG/DL) Please follow the RULE OF 15 for the treatment of hypoglycemia treatment (when your (blood sugars are less than 70 mg/dL)   STEP 1: Take 15 grams of carbohydrates when your blood sugar is low, which includes:  3-4 GLUCOSE TABS  OR 3-4 OZ OF JUICE OR REGULAR SODA OR ONE TUBE OF GLUCOSE GEL    STEP 2: RECHECK blood sugar in 15 MINUTES STEP 3: If your blood sugar is still low at the 15 minute recheck --> then, go back to STEP 1 and treat AGAIN with another 15 grams of carbohydrates.

## 2023-02-06 NOTE — Progress Notes (Signed)
Name: Kristina Long  Age/ Sex: 67 y.o., female   MRN/ DOB: [id number], 1955-09-14     PCP: Clayborn Heron, MD   Reason for Endocrinology Evaluation: Type 2 Diabetes Mellitus  Initial Endocrine Consultative Visit: 04/11/2018    PATIENT IDENTIFIER: Kristina Long is a 67 y.o. female with a past medical history of T2DM, Seasonal allergies and OSA. The patient has followed with Endocrinology clinic since 04/12/2019 for consultative assistance with management of her diabetes.  DIABETIC HISTORY:  Kristina Long was diagnosed with DM in 2013. She has been on Metformin since her diagnosis. She was prescribed Pioglitazone by her PCP in the past  but she never took it after she read the cardiac side effects. Her hemoglobin A1c has ranged from  in 7.3% in 2018, peaking at 10.5 % in 2013.  Januvia was cost prohibitive and by 04/2019 was replaced by Glipizide  Restarted januvia 06/2020  Attempted to switch Januvia to Rybelsus but she could not tolerated by 02/2022 due to dry mouth    THYROID HISTORY: During evaluation for vertigo a CT Scan showed an incidental finding of  multiple thyroid nodules with the largest 2.1 cm on the right, this prompted a thyroid ultrasound which was done 06/30/2021 revealing MNG with right superior and left inferior nodules meeting FNA criteria.   She is s/p FNA of the right superior 1.5 cm, left inferior 2.1 cm nodules with scant cellularity.  Patient declined repeat FNA and opted for serial ultrasounds   SUBJECTIVE:     During the last visit (01/03/2022): A1c 8.1%    Today (02/06/2023): Kristina Long is here for a follow up on diabetes management.  She has NOT ben to our clinic in 13 months. She checks glucose once a day. The patient has not had hypoglycemic episodes since the last clinic visit.   Since her last visit she was diagnosed with MNG and two of her nodules meet FNA criteria   Denies local neck symptoms  Denies Nausea , vomiting   Denies constipation or diarrhea     HOME ENDOCRINE REGIMEN:  Metformin 500 mg 2 tab BID  Januvia 100 mg daily  Atorvastatin 10 mg daily     METER DOWNLOAD SUMMARY: Did not bring     DIABETIC COMPLICATIONS: Microvascular complications:    Denies: Neuropathy, retinopathy , nephropthay Last eye exam: Completed 06/2022   Macrovascular complications:    Denies: CAD, CVA, PVD   HISTORY:  Past Medical History:  Past Medical History:  Diagnosis Date   Allergy    Diabetes mellitus without complication (HCC)    Past Surgical History: No past surgical history on file. Social History:  reports that she has never smoked. She has never used smokeless tobacco. She reports that she does not drink alcohol and does not use drugs. Family History:  Family History  Problem Relation Age of Onset   Diabetes Father    Diabetes Maternal Grandmother      HOME MEDICATIONS: Allergies as of 02/06/2023   No Active Allergies      Medication List        Accurate as of February 06, 2023 10:29 AM. If you have any questions, ask your nurse or doctor.          atorvastatin 10 MG tablet Commonly known as: LIPITOR TAKE 1 TABLET BY MOUTH EVERY DAY   calcium carbonate 600 MG tablet Commonly known as: OS-CAL Take 600 mg by mouth daily.  Januvia 100 MG tablet Generic drug: sitaGLIPtin TAKE 1 TABLET BY MOUTH EVERY DAY   lisinopril 2.5 MG tablet Commonly known as: ZESTRIL Take 2.5 mg by mouth daily.   loratadine 10 MG tablet Commonly known as: CLARITIN Take 10 mg by mouth daily as needed.   metFORMIN 500 MG 24 hr tablet Commonly known as: GLUCOPHAGE-XR 2 tabs in afternoon and 2 tabs night   OneTouch Delica Plus Lancet33G Misc USE AS INSTRUCTED TO CHECK BLOOD SUGAR 2 TIMES PER DAY   OneTouch Verio test strip Generic drug: glucose blood USE AS DIRECTED TO TEST BLOOD SUGAR TWICE A DAY         OBJECTIVE:   Vital Signs: BP 130/84 (BP Location: Left Arm, Patient  Position: Sitting, Cuff Size: Small)   Pulse 74   Ht 5\' 10"  (1.778 m)   Wt 207 lb (93.9 kg)   LMP  (LMP Unknown)   SpO2 99%   BMI 29.70 kg/m   Wt Readings from Last 3 Encounters:  02/06/23 207 lb (93.9 kg)  01/03/22 201 lb (91.2 kg)  07/12/21 197 lb (89.4 kg)     Exam: General: Pt appears well and is in NAD  Lungs: Clear with good BS bilat   Heart: RRR with normal    Extremities: No pretibial edema.  Neuro: MS is good with appropriate affect, pt is alert and Ox3    DM foot exam: 02/06/2023     The skin of the feet is without sores or ulcerations, but with multiple callous formation on the right plantar surface, right 3rd and 5th toe The pedal pulses are 2+ on right and 2+ on left. The sensation is intact to a screening 5.07, 10 gram monofilament bilaterally    DATA REVIEWED:  09/05/2022  BUN 15 0.750 GFR 86 HDL 41 LDL 50 Triglycerides 87    Latest Reference Range & Units 01/03/22 10:07  TSH 0.35 - 5.50 uIU/mL 0.51  Creatinine,U mg/dL 161.0  Microalb, Ur 0.0 - 1.9 mg/dL 1.3  MICROALB/CREAT RATIO 0.0 - 30.0 mg/g 1.1           Thyroid ULtrasound 01/04/2022  Estimated total number of nodules >/= 1 cm: >10   Number of spongiform nodules >/=  2 cm not described below (TR1): 0   Number of mixed cystic and solid nodules >/= 1.5 cm not described below (TR2): 0   _________________________________________________________   Nodule # 1: 1.1 cm spongiform nodule in the thyroid isthmus is considered sonographically low risk and does not meet criteria for further evaluation.   Nodule # 2 (previously # 7):   Prior biopsy: No   Location: Isthmus; Inferior   Maximum size: 1.3 cm; Other 2 dimensions: 1.3 x 1.1 cm, previously, 1.4 x 1.1 x 1.0 cm   Composition: solid/almost completely solid (2)   Echogenicity: hypoechoic (2)   Shape: not taller-than-wide (0)   Margins: smooth (0)   Echogenic foci: none (0)   ACR TI-RADS total points: 4.   ACR  TI-RADS risk category:  TR4 (4-6 points).   Significant change in size (>/= 20% in two dimensions and minimal increase of 2 mm): No   Change in features: No   Change in ACR TI-RADS risk category: No   ACR TI-RADS recommendations:   *Given size (>/= 1 - 1.4 cm) and appearance, a follow-up ultrasound in 1 year should be considered based on TI-RADS criteria.   _________________________________________________________   Nodule # 3: Isoechoic solid nodule in the right superior gland measures  no more than 1.4 cm and does not meet criteria to warrant further evaluation.   Nodule # 4 (previously # 3): The previously biopsied nodule in the right mid gland measures 1.4 x 1.3 x 0.7 cm which is slightly smaller compared to 1.5 x 1.4 x 0.8 cm previously. Slight involution over time is highly reassuring.   Nodule # 5 (previously # 4):   Prior biopsy: No   Location: Right; Mid   Maximum size: 1.9 cm; Other 2 dimensions: 1.7 x 1.4 cm, previously, 1.9 x 1.7 x 1.3 cm   Composition: solid/almost completely solid (2)   Echogenicity: isoechoic (1)   Shape: not taller-than-wide (0)   Margins: ill-defined (0)   Echogenic foci: none (0)   ACR TI-RADS total points: 3.   ACR TI-RADS risk category:  TR3 (3 points).   Significant change in size (>/= 20% in two dimensions and minimal increase of 2 mm): No   Change in features: No   Change in ACR TI-RADS risk category: No   ACR TI-RADS recommendations:   *Given size (>/= 1.5 - 2.4 cm) and appearance, a follow-up ultrasound in 1 year should be considered based on TI-RADS criteria.   _________________________________________________________   Nodule # 6 (previously # 5):   Prior biopsy: No   Location: Right; Inferior   Maximum size: 2.4 cm; Other 2 dimensions: 2.3 x 2.1 cm, previously, 2.4 x 2.2 x 2.1 cm   Composition: solid/almost completely solid (2)   Echogenicity: isoechoic (1)   Shape: not taller-than-wide (0)    Margins: smooth (0)   Echogenic foci: none (0)   ACR TI-RADS total points: 3.   ACR TI-RADS risk category:  TR3 (3 points).   Significant change in size (>/= 20% in two dimensions and minimal increase of 2 mm): No   Change in features: No   Change in ACR TI-RADS risk category: No   ACR TI-RADS recommendations:   *Given size (>/= 1.5 - 2.4 cm) and appearance, a follow-up ultrasound in 1 year should be considered based on TI-RADS criteria.   _________________________________________________________   Nodule # 7: 1.3 cm low risk spongiform nodule in the left mid gland does not meet criteria for further evaluation.   Nodule # 8: Small 1.2 cm low risk spongiform nodule in the left mid gland does not meet criteria for further evaluation.   Nodule # 9:   Prior biopsy: No   Location: Left; Inferior   Maximum size: 1.6 cm; Other 2 dimensions: 1.5 x 1.3 cm, previously, 1.7 x 1.4 x 0.9 cm   Composition: solid/almost completely solid (2)   Echogenicity: isoechoic (1)   Shape: not taller-than-wide (0)   Margins: ill-defined (0)   Echogenic foci: none (0)   ACR TI-RADS total points: 3.   ACR TI-RADS risk category:  TR3 (3 points).   Significant change in size (>/= 20% in two dimensions and minimal increase of 2 mm): No   Change in features: No   Change in ACR TI-RADS risk category: No   ACR TI-RADS recommendations:   *Given size (>/= 1.5 - 2.4 cm) and appearance, a follow-up ultrasound in 1 year should be considered based on TI-RADS criteria.   _________________________________________________________   Nodule # 10: Probably pseudo nodule, not previously measured.   Nodule # 11 (previously # 9): Previously biopsied nodule in the medial aspect of the left mid to lower gland measures 2.2 x 1.9 x 1.4 cm, insignificantly changed compared to 2.1 x 1.7 x 1.6 cm.  IMPRESSION: 1. Slight interval involution of previously biopsied nodule in the right mid gland.  Decreasing size over time is highly suggestive of a benign process. 2. No significant interval change in the size or appearance of the previously biopsied nodule in the left mid to lower gland. 3. Multiple additional nodules are again noted bilaterally. None demonstrate any significant change in size, appearance or TI-RADS classification. Recommend follow-up ultrasound in 1 year for nodules #2, 5, 6 and 9 described above.     FNA right superior nodule 07/26/2021  Clinical History: Right; Superior 1.5cm; Other 2 dimensions: 1.4 x  0.8cm  FINAL MICROSCOPIC DIAGNOSIS:  - Scant follicular epithelium present (Bethesda category I)   FNA Left inferior nodule 07/26/2021  Clinical History: Left; Inferior 2.1cm; Other 2 dimensions: 1.7 x 1.6cm  FINAL MICROSCOPIC DIAGNOSIS:  - Scant follicular epithelium present (Bethesda category I)  ASSESSMENT / PLAN / RECOMMENDATIONS:   1) Type 2 Diabetes Mellitus, Optimally controlled, without  complications - Most recent A1c of 7.7 %. Goal A1c < 7.0 %.    -A1c continues to be above goal -I have attempted to switch Januvia to Rybelsus but she developed dry mouth and opted to switch back to Januvia 2023 -I have recommended starting her on glimepiride, caution against hypoglycemia and the importance of taking it before breakfast  MEDICATIONS: -Start glimepiride 1 mg, 1 tablet daily -Continue Januvia 100 mg daily -Continue Metformin 500 mg 2 tablets twice daily   EDUCATION / INSTRUCTIONS: BG monitoring instructions: Patient is instructed to check her blood sugars 1 times a day, fasting Call [facility] if: BG persistently < 70  I reviewed the Rule of 15 for the treatment of hypoglycemia in detail with the patient. Literature supplied.  2) Diabetic complications:  Eye: She does not have known diabetic retinopathy.   Neuro/ Feet: does not have known diabetic peripheral neuropathy. Renal: Patient does not have known baseline CKD.       3) Dyslipidemia:  - LDL was 95 mg/dL in 11/5782 and we started small dose of atorvastatin . Tolerating well  - LDL at goal through PCPs office   Medication   Continue Atorvastatin 10 mg daily   4) Multinodular Goiter :  - No local neck symptoms  - She is S/P  FNA of the right superior and left inferior nodules in January 2023 with scant cellularity.  The patient opted not to proceed with a repeat FNA  -We will proceed with  ultrasound follow-up    F/U in 6 months    Signed electronically by: Lyndle Herrlich, MD  [facility] Endocrinology  [facility] [address] [place], [place] [address] Phone: [phone] FAX: [phone]   CC: Clayborn Heron, MD [address] [place] [address] Phone: [phone]  Fax: [phone]  Return to Endocrinology clinic as below: No future appointments.

## 2023-03-01 ENCOUNTER — Ambulatory Visit (INDEPENDENT_AMBULATORY_CARE_PROVIDER_SITE_OTHER): Payer: Medicare Other | Admitting: Podiatry

## 2023-03-01 ENCOUNTER — Encounter: Payer: Self-pay | Admitting: Podiatry

## 2023-03-01 DIAGNOSIS — Q828 Other specified congenital malformations of skin: Secondary | ICD-10-CM | POA: Diagnosis not present

## 2023-03-01 DIAGNOSIS — E1140 Type 2 diabetes mellitus with diabetic neuropathy, unspecified: Secondary | ICD-10-CM | POA: Diagnosis not present

## 2023-03-01 DIAGNOSIS — E1149 Type 2 diabetes mellitus with other diabetic neurological complication: Secondary | ICD-10-CM | POA: Diagnosis not present

## 2023-03-01 NOTE — Progress Notes (Signed)
Subjective:   Patient ID: Kristina Long, female   DOB: 67 y.o.   MRN: [id number]   HPI Patient presents chronic lesions plantar aspect left foot that are painful when pressed long-term diabetes noted   ROS      Objective:  Physical Exam  Neuro vascular status unchanged diminishment sharp dull vibratory with severe thick keratotic lesions of plantar aspect both feet chronic     Assessment:  Lesion formation bilateral with risk     Plan:  H&P done reviewed Sharp sterile debridement of all lesions no iatrogenic bleeding reappoint routine care

## 2023-03-27 ENCOUNTER — Other Ambulatory Visit: Payer: Medicare Other

## 2023-03-27 ENCOUNTER — Ambulatory Visit
Admission: RE | Admit: 2023-03-27 | Discharge: 2023-03-27 | Disposition: A | Discharge status: Home / Self Care | Payer: Medicare Other | Source: Ambulatory Visit | Attending: Internal Medicine | Admitting: Internal Medicine

## 2023-03-27 DIAGNOSIS — E042 Nontoxic multinodular goiter: Secondary | ICD-10-CM

## 2023-04-17 ENCOUNTER — Other Ambulatory Visit: Payer: Self-pay | Admitting: Internal Medicine

## 2023-04-17 DIAGNOSIS — E1165 Type 2 diabetes mellitus with hyperglycemia: Secondary | ICD-10-CM

## 2023-07-19 ENCOUNTER — Encounter: Payer: Self-pay | Admitting: Podiatry

## 2023-07-19 ENCOUNTER — Ambulatory Visit (INDEPENDENT_AMBULATORY_CARE_PROVIDER_SITE_OTHER): Payer: Medicare Other | Admitting: Podiatry

## 2023-07-19 VITALS — Ht 70.0 in | Wt 207.0 lb

## 2023-07-19 DIAGNOSIS — E1140 Type 2 diabetes mellitus with diabetic neuropathy, unspecified: Secondary | ICD-10-CM

## 2023-07-19 DIAGNOSIS — Q828 Other specified congenital malformations of skin: Secondary | ICD-10-CM | POA: Diagnosis not present

## 2023-07-19 DIAGNOSIS — E1149 Type 2 diabetes mellitus with other diabetic neurological complication: Secondary | ICD-10-CM

## 2023-07-19 NOTE — Progress Notes (Signed)
Subjective:   Patient ID: Kristina Long, female   DOB: 68 y.o.   MRN: [id number]   HPI Patient presents severe lesions both feet that get very sore with patient being long-term diabetic   ROS      Objective:  Physical Exam  Neurovascular status unchanged from previous visits with severe thick keratotic lesions subthird metatarsal right third and fifth digit right and left and right hallux     Assessment:  Chronic lesion formation with high at risk patient diabetes with neurological disease     Plan:  Sharp sterile debridement of all lesions no iatrogenic bleeding reappoint routine care

## 2023-08-02 LAB — HM DIABETES EYE EXAM

## 2023-09-05 ENCOUNTER — Other Ambulatory Visit: Payer: Self-pay | Admitting: Internal Medicine

## 2023-10-22 ENCOUNTER — Emergency Department (HOSPITAL_BASED_OUTPATIENT_CLINIC_OR_DEPARTMENT_OTHER)
Admission: EM | Admit: 2023-10-22 | Discharge: 2023-10-22 | Disposition: A | Discharge status: Home / Self Care | Attending: Emergency Medicine | Admitting: Emergency Medicine

## 2023-10-22 ENCOUNTER — Encounter (HOSPITAL_BASED_OUTPATIENT_CLINIC_OR_DEPARTMENT_OTHER): Payer: Self-pay

## 2023-10-22 ENCOUNTER — Other Ambulatory Visit: Payer: Self-pay

## 2023-10-22 ENCOUNTER — Emergency Department (HOSPITAL_BASED_OUTPATIENT_CLINIC_OR_DEPARTMENT_OTHER)

## 2023-10-22 DIAGNOSIS — E119 Type 2 diabetes mellitus without complications: Secondary | ICD-10-CM | POA: Diagnosis not present

## 2023-10-22 DIAGNOSIS — Z7984 Long term (current) use of oral hypoglycemic drugs: Secondary | ICD-10-CM | POA: Diagnosis not present

## 2023-10-22 DIAGNOSIS — R10A0 Flank pain, unspecified side: Secondary | ICD-10-CM

## 2023-10-22 DIAGNOSIS — R109 Unspecified abdominal pain: Secondary | ICD-10-CM

## 2023-10-22 DIAGNOSIS — K429 Umbilical hernia without obstruction or gangrene: Secondary | ICD-10-CM | POA: Insufficient documentation

## 2023-10-22 LAB — URINALYSIS, W/ REFLEX TO CULTURE (INFECTION SUSPECTED)
Bilirubin Urine: NEGATIVE
Glucose, UA: >=500 mg/dL — AB
Ketones, ur: NEGATIVE mg/dL
Leukocytes,Ua: NEGATIVE
Nitrite: NEGATIVE
Protein, ur: 100 mg/dL — AB
Specific Gravity, Urine: 1.025 (ref 1.005–1.030)
pH: 6.5 (ref 5.0–8.0)

## 2023-10-22 LAB — CBC WITH DIFFERENTIAL/PLATELET
Abs Immature Granulocytes: 0.11 10*3/uL — ABNORMAL HIGH (ref 0.00–0.07)
Basophils Absolute: 0 10*3/uL (ref 0.0–0.1)
Basophils Relative: 0 %
Eosinophils Absolute: 0 10*3/uL (ref 0.0–0.5)
Eosinophils Relative: 0 %
HCT: 35.2 % — ABNORMAL LOW (ref 36.0–46.0)
Hemoglobin: 11.5 g/dL — ABNORMAL LOW (ref 12.0–15.0)
Immature Granulocytes: 1 %
Lymphocytes Relative: 7 %
Lymphs Abs: 1.3 10*3/uL (ref 0.7–4.0)
MCH: 27.4 pg (ref 26.0–34.0)
MCHC: 32.7 g/dL (ref 30.0–36.0)
MCV: 84 fL (ref 80.0–100.0)
Monocytes Absolute: 0.9 10*3/uL (ref 0.1–1.0)
Monocytes Relative: 5 %
Neutro Abs: 17.3 10*3/uL — ABNORMAL HIGH (ref 1.7–7.7)
Neutrophils Relative %: 87 %
Platelets: 252 10*3/uL (ref 150–400)
RBC: 4.19 MIL/uL (ref 3.87–5.11)
RDW: 14.3 % (ref 11.5–15.5)
WBC: 19.7 10*3/uL — ABNORMAL HIGH (ref 4.0–10.5)
nRBC: 0 % (ref 0.0–0.2)

## 2023-10-22 LAB — COMPREHENSIVE METABOLIC PANEL WITH GFR
ALT: 28 U/L (ref 0–44)
AST: 25 U/L (ref 15–41)
Albumin: 4.1 g/dL (ref 3.5–5.0)
Alkaline Phosphatase: 100 U/L (ref 38–126)
Anion gap: 17 — ABNORMAL HIGH (ref 5–15)
BUN: 13 mg/dL (ref 8–23)
CO2: 21 mmol/L — ABNORMAL LOW (ref 22–32)
Calcium: 10 mg/dL (ref 8.9–10.3)
Chloride: 100 mmol/L (ref 98–111)
Creatinine, Ser: 0.79 mg/dL (ref 0.44–1.00)
GFR, Estimated: >60 mL/min (ref >60)
Glucose, Bld: 272 mg/dL — ABNORMAL HIGH (ref 70–99)
Potassium: 4.1 mmol/L (ref 3.5–5.1)
Sodium: 138 mmol/L (ref 135–145)
Total Bilirubin: 0.6 mg/dL (ref 0.0–1.2)
Total Protein: 8.4 g/dL — ABNORMAL HIGH (ref 6.5–8.1)

## 2023-10-22 LAB — LIPASE, BLOOD: Lipase: 19 U/L (ref 11–51)

## 2023-10-22 MED ORDER — IOHEXOL 300 MG/ML  SOLN
100.0000 mL | Freq: Once | INTRAMUSCULAR | Status: AC | PRN
  Administered 2023-10-22: 100 mL via INTRAVENOUS

## 2023-10-22 MED ORDER — KETOROLAC TROMETHAMINE 15 MG/ML IJ SOLN
15.0000 mg | Freq: Once | INTRAMUSCULAR | Status: AC
  Administered 2023-10-22: 15 mg via INTRAVENOUS
  Filled 2023-10-22: qty 1

## 2023-10-22 MED ORDER — METHOCARBAMOL 500 MG PO TABS: 500.0000 mg | ORAL_TABLET | Freq: Three times a day (TID) | ORAL | 0 refills | Status: DC | PRN

## 2023-10-22 NOTE — ED Notes (Signed)
 Pt able to tolerate PO challenge, EDP notified

## 2023-10-22 NOTE — ED Triage Notes (Signed)
 Pt presents to ED from home C/O R flank pain radiating to RLQ since Saturday.

## 2023-10-22 NOTE — ED Provider Notes (Signed)
 [place] [facility] Provider Note   CSN: [id number] Arrival date & time: 10/22/23  4098     History  Chief Complaint  Patient presents with   Flank Pain    Kristina Long is a 68 y.o. female.   Flank Pain  Patient denies with right sided flank/back pain.  Comes around to abdomen.  No dysuria.  No nausea or vomiting.  Has had for around about 4 days.  No injury.  Has been carrying a heavy purse however.  No diarrhea or constipation.  No rash.    Past Medical History:  Diagnosis Date   Allergy    Diabetes mellitus without complication (HCC)     Home Medications Prior to Admission medications   Medication Sig Start Date End Date Taking? Authorizing Provider  methocarbamol (ROBAXIN) 500 MG tablet Take 1 tablet (500 mg total) by mouth every 8 (eight) hours as needed. 10/22/23  Yes Mozell Arias, MD  atorvastatin  (LIPITOR) 10 MG tablet Take 1 tablet (10 mg total) by mouth daily. 02/06/23   Shamleffer, Ibtehal Jaralla, MD  calcium  carbonate (OS-CAL) 600 MG tablet Take 600 mg by mouth daily.    [provider]  glimepiride  (AMARYL ) 1 MG tablet Take 1 tablet (1 mg total) by mouth daily with breakfast. 02/06/23   Shamleffer, Ibtehal Jaralla, MD  Lancets (ONETOUCH DELICA PLUS LANCET33G) MISC USE AS INSTRUCTED TO CHECK BLOOD SUGAR 2 TIMES PER DAY 09/05/23   Shamleffer, Ibtehal Jaralla, MD  lisinopril (ZESTRIL) 2.5 MG tablet Take 2.5 mg by mouth daily. 04/25/21   [provider]  loratadine (CLARITIN) 10 MG tablet Take 10 mg by mouth daily as needed.    [provider]  metFORMIN  (GLUCOPHAGE -XR) 500 MG 24 hr tablet Take 2 tablets (1,000 mg total) by mouth 2 (two) times daily with a meal. 2 tabs in afternoon and 2 tabs night 02/06/23   Shamleffer, Ibtehal Jaralla, MD  ONETOUCH VERIO test strip USE AS DIRECTED TO TEST BLOOD SUGAR TWICE A DAY 04/17/23   Shamleffer, Julian Obey, MD  sitaGLIPtin  (JANUVIA ) 100 MG tablet Take 1  tablet (100 mg total) by mouth daily. 02/06/23   Shamleffer, Ibtehal Jaralla, MD      Allergies    Patient has no known allergies.    Review of Systems   Review of Systems  Genitourinary:  Positive for flank pain.    Physical Exam Updated Vital Signs BP (!) 171/81   Pulse 88   Temp 98.7 F (37.1 C) (Oral)   Resp 18   Ht 5\' 10"  (1.778 m)   Wt 90.7 kg   LMP  (LMP Unknown)   SpO2 96%   BMI 28.70 kg/m  Physical Exam Vitals and nursing note reviewed.  Cardiovascular:     Rate and Rhythm: Normal rate.  Abdominal:     Comments: Mild right-sided tenderness.  No rebound or guarding.  Small reducible umbilical hernia.  Musculoskeletal:     Comments: No CVA tenderness on the right.  Skin:    Capillary Refill: Capillary refill takes less than 2 seconds.  Neurological:     Mental Status: She is alert and oriented to person, place, and time.     ED Results / Procedures / Treatments   Labs (all labs ordered are listed, but only abnormal results are displayed) Labs Reviewed  COMPREHENSIVE METABOLIC PANEL WITH GFR - Abnormal; Notable for the following components:      Result Value   CO2 21 (*)  Glucose, Bld 272 (*)    Total Protein 8.4 (*)    Anion gap 17 (*)    All other components within normal limits  CBC WITH DIFFERENTIAL/PLATELET - Abnormal; Notable for the following components:   WBC 19.7 (*)    Hemoglobin 11.5 (*)    HCT 35.2 (*)    Neutro Abs 17.3 (*)    Abs Immature Granulocytes 0.11 (*)    All other components within normal limits  URINALYSIS, W/ REFLEX TO CULTURE (INFECTION SUSPECTED) - Abnormal; Notable for the following components:   Glucose, UA >=500 (*)    Hgb urine dipstick TRACE (*)    Protein, ur 100 (*)    Bacteria, UA RARE (*)    All other components within normal limits  LIPASE, BLOOD    EKG None  Radiology CT ABDOMEN PELVIS W CONTRAST Result Date: 10/22/2023 CLINICAL DATA:  Right lower quadrant and flank pain for several days. EXAM: CT  ABDOMEN AND PELVIS WITH CONTRAST TECHNIQUE: Multidetector CT imaging of the abdomen and pelvis was performed using the standard protocol following bolus administration of intravenous contrast. RADIATION DOSE REDUCTION: This exam was performed according to the departmental dose-optimization program which includes automated exposure control, adjustment of the mA and/or kV according to patient size and/or use of iterative reconstruction technique. CONTRAST:  OMNIPAQUE  IOHEXOL  300 MG/ML  SOLN COMPARISON:  08/29/2020 FINDINGS: Lower Chest: No acute findings. Hepatobiliary: No suspicious hepatic masses identified. Mild steatosis again noted. Gallbladder is unremarkable. No evidence of biliary ductal dilatation. Pancreas:  No mass or inflammatory changes. Spleen: Within normal limits in size and appearance. Adrenals/Urinary Tract: Stable sub-centimeter fat attenuation lesion in midpole of left kidney, consistent with benign angiomyolipoma (No followup imaging is recommended) . No evidence of ureteral calculi or hydronephrosis. Stomach/Bowel: No evidence of obstruction, inflammatory process or abnormal fluid collections. Normal appendix visualized. Vascular/Lymphatic: No pathologically enlarged lymph nodes. No acute vascular findings. Reproductive: Several small uterine fibroids are again seen, some of which are calcified, largest measuring 2.2 cm. Adnexal regions are unremarkable. Other:  Stable small fat-containing umbilical hernia. Musculoskeletal:  No suspicious bone lesions identified. IMPRESSION: No evidence of appendicitis, hydronephrosis, or other acute findings. Stable mild hepatic steatosis Stable small uterine fibroids. Stable small fat-containing umbilical hernia. Electronically Signed   By: Marlyce Sine M.D.   On: 10/22/2023 08:53    Procedures Procedures    Medications Ordered in ED Medications  iohexol  (OMNIPAQUE ) 300 MG/ML solution 100 mL (100 mLs Intravenous Contrast Given 10/22/23 0824)   ketorolac  (TORADOL ) 15 MG/ML injection 15 mg (15 mg Intravenous Given 10/22/23 0913)    ED Course/ Medical Decision Making/ A&P                                 Medical Decision Making Amount and/or Complexity of Data Reviewed Labs: ordered. Radiology: ordered.  Risk Prescription drug management.   Patient with right flank/abdominal pain.  Has had for the last few days.  Differential diagnoses long but includes causes such as musculoskeletal pain but also biliary disease, kidney stone, UTI, appendicitis.  Will get blood work urinalysis CT scan.  Urinalysis reassuring.  There is a leukocytosis on CBC.  Feeling better after treatment.  CT scan done and does not show clear cause of pain.  Potentially could be musculoskeletal.  Will treat with muscle relaxers.  Follow-up with PCP.  Will discharge home.        Final Clinical  Impression(s) / ED Diagnoses Final diagnoses:  Flank pain    Rx / DC Orders ED Discharge Orders          Ordered    methocarbamol (ROBAXIN) 500 MG tablet  Every 8 hours PRN        10/22/23 1053              Mozell Arias, MD 10/22/23 1053

## 2023-10-22 NOTE — ED Notes (Signed)
Pt given water per EDP order

## 2023-10-24 ENCOUNTER — Emergency Department (HOSPITAL_COMMUNITY)

## 2023-10-24 ENCOUNTER — Emergency Department (HOSPITAL_COMMUNITY)
Admission: EM | Admit: 2023-10-24 | Discharge: 2023-10-24 | Disposition: A | Discharge status: Home / Self Care | Source: Home / Self Care | Attending: Emergency Medicine | Admitting: Emergency Medicine

## 2023-10-24 ENCOUNTER — Encounter (HOSPITAL_COMMUNITY): Payer: Self-pay

## 2023-10-24 DIAGNOSIS — K760 Fatty (change of) liver, not elsewhere classified: Secondary | ICD-10-CM | POA: Insufficient documentation

## 2023-10-24 DIAGNOSIS — Z7984 Long term (current) use of oral hypoglycemic drugs: Secondary | ICD-10-CM | POA: Insufficient documentation

## 2023-10-24 DIAGNOSIS — A419 Sepsis, unspecified organism: Secondary | ICD-10-CM | POA: Diagnosis not present

## 2023-10-24 DIAGNOSIS — R109 Unspecified abdominal pain: Secondary | ICD-10-CM

## 2023-10-24 DIAGNOSIS — E119 Type 2 diabetes mellitus without complications: Secondary | ICD-10-CM | POA: Insufficient documentation

## 2023-10-24 DIAGNOSIS — M545 Low back pain, unspecified: Secondary | ICD-10-CM | POA: Diagnosis not present

## 2023-10-24 LAB — COMPREHENSIVE METABOLIC PANEL WITH GFR
ALT: 30 U/L (ref 0–44)
AST: 25 U/L (ref 15–41)
Albumin: 3.4 g/dL — ABNORMAL LOW (ref 3.5–5.0)
Alkaline Phosphatase: 93 U/L (ref 38–126)
Anion gap: 14 (ref 5–15)
BUN: 18 mg/dL (ref 8–23)
CO2: 22 mmol/L (ref 22–32)
Calcium: 9.2 mg/dL (ref 8.9–10.3)
Chloride: 98 mmol/L (ref 98–111)
Creatinine, Ser: 0.52 mg/dL (ref 0.44–1.00)
GFR, Estimated: >60 mL/min (ref >60)
Glucose, Bld: 282 mg/dL — ABNORMAL HIGH (ref 70–99)
Potassium: 3.4 mmol/L — ABNORMAL LOW (ref 3.5–5.1)
Sodium: 134 mmol/L — ABNORMAL LOW (ref 135–145)
Total Bilirubin: 0.8 mg/dL (ref 0.0–1.2)
Total Protein: 8.6 g/dL — ABNORMAL HIGH (ref 6.5–8.1)

## 2023-10-24 LAB — CBC WITH DIFFERENTIAL/PLATELET
Abs Immature Granulocytes: 0.1 10*3/uL — ABNORMAL HIGH (ref 0.00–0.07)
Basophils Absolute: 0 10*3/uL (ref 0.0–0.1)
Basophils Relative: 0 %
Eosinophils Absolute: 0 10*3/uL (ref 0.0–0.5)
Eosinophils Relative: 0 %
HCT: 37.7 % (ref 36.0–46.0)
Hemoglobin: 11.7 g/dL — ABNORMAL LOW (ref 12.0–15.0)
Immature Granulocytes: 1 %
Lymphocytes Relative: 9 %
Lymphs Abs: 1.6 10*3/uL (ref 0.7–4.0)
MCH: 27.1 pg (ref 26.0–34.0)
MCHC: 31 g/dL (ref 30.0–36.0)
MCV: 87.3 fL (ref 80.0–100.0)
Monocytes Absolute: 0.9 10*3/uL (ref 0.1–1.0)
Monocytes Relative: 5 %
Neutro Abs: 15.8 10*3/uL — ABNORMAL HIGH (ref 1.7–7.7)
Neutrophils Relative %: 85 %
Platelets: 305 10*3/uL (ref 150–400)
RBC: 4.32 MIL/uL (ref 3.87–5.11)
RDW: 14.1 % (ref 11.5–15.5)
WBC: 18.4 10*3/uL — ABNORMAL HIGH (ref 4.0–10.5)
nRBC: 0 % (ref 0.0–0.2)

## 2023-10-24 LAB — PROTIME-INR
INR: 1.2 (ref 0.8–1.2)
Prothrombin Time: 15.4 s — ABNORMAL HIGH (ref 11.4–15.2)

## 2023-10-24 LAB — URINALYSIS, W/ REFLEX TO CULTURE (INFECTION SUSPECTED)
Bacteria, UA: NONE SEEN
Bilirubin Urine: NEGATIVE
Glucose, UA: 150 mg/dL — AB
Ketones, ur: 20 mg/dL — AB
Leukocytes,Ua: NEGATIVE
Nitrite: NEGATIVE
Protein, ur: >=300 mg/dL — AB
Specific Gravity, Urine: 1.027 (ref 1.005–1.030)
pH: 5 (ref 5.0–8.0)

## 2023-10-24 LAB — RESP PANEL BY RT-PCR (RSV, FLU A&B, COVID)  RVPGX2
Influenza A by PCR: NEGATIVE
Influenza B by PCR: NEGATIVE
Resp Syncytial Virus by PCR: NEGATIVE
SARS Coronavirus 2 by RT PCR: NEGATIVE

## 2023-10-24 LAB — I-STAT CG4 LACTIC ACID, ED: Lactic Acid, Venous: 1.4 mmol/L (ref 0.5–1.9)

## 2023-10-24 LAB — CBG MONITORING, ED: Glucose-Capillary: 267 mg/dL — ABNORMAL HIGH (ref 70–99)

## 2023-10-24 MED ORDER — ACETAMINOPHEN 325 MG PO TABS
650.0000 mg | ORAL_TABLET | Freq: Once | ORAL | Status: AC
  Administered 2023-10-24: 650 mg via ORAL
  Filled 2023-10-24: qty 2

## 2023-10-24 MED ORDER — OXYCODONE-ACETAMINOPHEN 5-325 MG PO TABS: 1.0000 | ORAL_TABLET | Freq: Four times a day (QID) | ORAL | 0 refills | Status: DC | PRN

## 2023-10-24 MED ORDER — METAXALONE 800 MG PO TABS: 800.0000 mg | ORAL_TABLET | Freq: Three times a day (TID) | ORAL | 0 refills | Status: DC

## 2023-10-24 NOTE — ED Provider Notes (Signed)
 [place] EMERGENCY DEPARTMENT AT [facility] Provider Note   CSN: [id number] Arrival date & time: 10/24/23  1419     History  Chief Complaint  Patient presents with   Flank Pain    Kristina Long is a 68 y.o. female.  68 year old female presents with continued right-sided flank pain.  Patient states her symptoms started 5 days ago.  She was seen in the ED few days ago and had negative workup including abdominal CT.  States that she continues to note sharp pain that is worse with certain movements.  Denies any hip pain.  No urinary symptoms.  No cough or congestion.  Denies any vomiting or diarrhea.  Went to urgent care today and sent here for further evaluation.  According to the patient and her husband at bedside they were concerned about possibility of DKA.  Patient does have history of type 2 diabetes but denies any polyuria or polydipsia.  She is not noted increasing weakness.  States her blood sugar at home has been in the 200s      Home Medications Prior to Admission medications   Medication Sig Start Date End Date Taking? Authorizing Provider  atorvastatin  (LIPITOR) 10 MG tablet Take 1 tablet (10 mg total) by mouth daily. 02/06/23   Shamleffer, Ibtehal Jaralla, MD  calcium  carbonate (OS-CAL) 600 MG tablet Take 600 mg by mouth daily.    [provider]  glimepiride  (AMARYL ) 1 MG tablet Take 1 tablet (1 mg total) by mouth daily with breakfast. 02/06/23   Shamleffer, Ibtehal Jaralla, MD  Lancets (ONETOUCH DELICA PLUS LANCET33G) MISC USE AS INSTRUCTED TO CHECK BLOOD SUGAR 2 TIMES PER DAY 09/05/23   Shamleffer, Ibtehal Jaralla, MD  lisinopril (ZESTRIL) 2.5 MG tablet Take 2.5 mg by mouth daily. 04/25/21   [provider]  loratadine (CLARITIN) 10 MG tablet Take 10 mg by mouth daily as needed.    [provider]  metFORMIN  (GLUCOPHAGE -XR) 500 MG 24 hr tablet Take 2 tablets (1,000 mg total) by mouth 2 (two) times daily with a meal. 2 tabs in  afternoon and 2 tabs night 02/06/23   Shamleffer, Ibtehal Jaralla, MD  methocarbamol (ROBAXIN) 500 MG tablet Take 1 tablet (500 mg total) by mouth every 8 (eight) hours as needed. 10/22/23   Mozell Arias, MD  [facility] VERIO test strip USE AS DIRECTED TO TEST BLOOD SUGAR TWICE A DAY 04/17/23   Shamleffer, Ibtehal Jaralla, MD  sitaGLIPtin  (JANUVIA ) 100 MG tablet Take 1 tablet (100 mg total) by mouth daily. 02/06/23   Shamleffer, Ibtehal Jaralla, MD      Allergies    Patient has no known allergies.    Review of Systems   Review of Systems  All other systems reviewed and are negative.   Physical Exam Updated Vital Signs BP (!) 166/91 (BP Location: Right Arm)   Pulse 96   Temp (!) 100.5 F (38.1 C) (Oral)   Resp 18   LMP  (LMP Unknown)   SpO2 97%  Physical Exam Vitals and nursing note reviewed.  Constitutional:      General: She is not in acute distress.    Appearance: Normal appearance. She is well-developed. She is not toxic-appearing.  HENT:     Head: Normocephalic and atraumatic.  Eyes:     General: Lids are normal.     Conjunctiva/sclera: Conjunctivae normal.     Pupils: Pupils are equal, round, and reactive to light.  Neck:     Thyroid : No thyroid  mass.  Trachea: No tracheal deviation.  Cardiovascular:     Rate and Rhythm: Normal rate and regular rhythm.     Heart sounds: Normal heart sounds. No murmur heard.    No gallop.  Pulmonary:     Effort: Pulmonary effort is normal. No respiratory distress.     Breath sounds: Normal breath sounds. No stridor. No decreased breath sounds, wheezing, rhonchi or rales.  Abdominal:     General: There is no distension.     Palpations: Abdomen is soft.     Tenderness: There is no abdominal tenderness. There is no rebound.    Musculoskeletal:        General: No tenderness. Normal range of motion.     Cervical back: Normal range of motion and neck supple.       Back:  Skin:    General: Skin is warm and dry.     Findings:  No abrasion or rash.  Neurological:     Mental Status: She is alert and oriented to person, place, and time. Mental status is at baseline.     GCS: GCS eye subscore is 4. GCS verbal subscore is 5. GCS motor subscore is 6.     Cranial Nerves: No cranial nerve deficit.     Sensory: No sensory deficit.     Motor: Motor function is intact.  Psychiatric:        Attention and Perception: Attention normal.        Speech: Speech normal.        Behavior: Behavior normal.    ED Results / Procedures / Treatments   Labs (all labs ordered are listed, but only abnormal results are displayed) Labs Reviewed  CULTURE, BLOOD (ROUTINE X 2)  CULTURE, BLOOD (ROUTINE X 2)  RESP PANEL BY RT-PCR (RSV, FLU A&B, COVID)  RVPGX2  COMPREHENSIVE METABOLIC PANEL WITH GFR  CBC WITH DIFFERENTIAL/PLATELET  PROTIME-INR  URINALYSIS, W/ REFLEX TO CULTURE (INFECTION SUSPECTED)  I-STAT CG4 LACTIC ACID , ED    EKG EKG Interpretation Date/Time:  Wednesday October 24 2023 15:11:01 EDT Ventricular Rate:  99 PR Interval:  153 QRS Duration:  83 QT Interval:  330 QTC Calculation: 424 R Axis:   29  Text Interpretation: Sinus rhythm Nonspecific T abnormalities, diffuse leads Confirmed by Lind Repine ([id number]) on 10/24/2023 3:40:59 PM  Radiology DG Chest Port 1 View Result Date: 10/24/2023 CLINICAL DATA:  Questionable sepsis - evaluate for abnormality EXAM: PORTABLE CHEST - 1 VIEW COMPARISON:  None available. FINDINGS: Streaky bibasilar atelectasis. No focal airspace consolidation, pleural effusion, or pneumothorax. No cardiomegaly. No acute fracture or destructive lesion. Multilevel thoracic osteophytosis. IMPRESSION: No acute cardiopulmonary abnormality. Electronically Signed   By: Rance Burrows M.D.   On: 10/24/2023 15:29    Procedures Procedures    Medications Ordered in ED Medications  acetaminophen (TYLENOL) tablet 650 mg (has no administration in time range)    ED Course/ Medical Decision Making/ A&P                                  Medical Decision Making Amount and/or Complexity of Data Reviewed Radiology: ordered.   Patient is EKG shows normal sinus rhythm.  Patient medicated for pain and feels better.  Patient was concerned she might have DKA but her blood sugar is 267 with a normal anion gap.  Low-grade temperature noted here.  Having given Toradol  prior to me seeing her and she feels better.  Viral  panel negative.  Lactate normal.  No concern for sepsis.  CT renal stone showed no evidence of hydronephrosis.  She had normal appendix.  Pain is reproducible at the right crest of the pelvis.  Plan will be for patient to be discharged home with pain medication and follow-up with her doctor        Final Clinical Impression(s) / ED Diagnoses Final diagnoses:  None    Rx / DC Orders ED Discharge Orders     None         Lind Repine, MD 10/24/23 2006

## 2023-10-24 NOTE — ED Triage Notes (Signed)
 Pt presents with c/o fever, flank pain radiating to her abdomen and pelvis.

## 2023-10-24 NOTE — ED Provider Triage Note (Signed)
 Emergency Medicine Provider Triage Evaluation Note  Kristina Long , a 68 y.o. female  was evaluated in triage.  Pt complains of continued right flank pain or right lower abdominal pain ongoing for several days now.  Patient was seen in the ED 2 days ago for this, had a white blood cell count elevation of 19, had a CT abdomen pelvis with no emergent findings.  UA did not show clear signs of infection and she was discharged.  She returns with worsening symptoms, feeling fatigued  Review of Systems  Positive: Right flank pain, fatigue Negative: Diarrhea, dysuria  Physical Exam  BP (!) 166/91 (BP Location: Right Arm)   Pulse 96   Temp (!) 100.5 F (38.1 C) (Oral)   Resp 18   LMP  (LMP Unknown)   SpO2 97%  Gen:   Awake, no distress   Resp:  Normal effort  MSK:   Mild right CVA tenderness tenderness along the right posterior pelvis, no spinal midline ttp   Medical Decision Making  Medically screening exam initiated at 2:58 PM.  Appropriate orders placed.  Sherona Denmark was informed that the remainder of the evaluation will be completed by another provider, this initial triage assessment does not replace that evaluation, and the importance of remaining in the ED until their evaluation is complete.  Febrile here, HR 96 Sepsis workup initiated Given toradol  prior to arrival at PCP's   Arvilla Birmingham, MD 10/24/23 1459

## 2023-10-26 ENCOUNTER — Encounter (HOSPITAL_COMMUNITY): Payer: Self-pay

## 2023-10-26 ENCOUNTER — Other Ambulatory Visit: Payer: Self-pay

## 2023-10-26 ENCOUNTER — Inpatient Hospital Stay (HOSPITAL_COMMUNITY)
Admission: EM | Admit: 2023-10-26 | Discharge: 2023-11-07 | DRG: 853 | Disposition: A | Discharge status: Rehabilitation Facility | Attending: Internal Medicine | Admitting: Internal Medicine

## 2023-10-26 DIAGNOSIS — E1165 Type 2 diabetes mellitus with hyperglycemia: Secondary | ICD-10-CM | POA: Diagnosis present

## 2023-10-26 DIAGNOSIS — R10A1 Flank pain, right side: Secondary | ICD-10-CM | POA: Diagnosis present

## 2023-10-26 DIAGNOSIS — Z9889 Other specified postprocedural states: Secondary | ICD-10-CM

## 2023-10-26 DIAGNOSIS — M4804 Spinal stenosis, thoracic region: Secondary | ICD-10-CM | POA: Diagnosis present

## 2023-10-26 DIAGNOSIS — M4316 Spondylolisthesis, lumbar region: Secondary | ICD-10-CM | POA: Diagnosis present

## 2023-10-26 DIAGNOSIS — R7881 Bacteremia: Secondary | ICD-10-CM

## 2023-10-26 DIAGNOSIS — T50915A Adverse effect of multiple unspecified drugs, medicaments and biological substances, initial encounter: Secondary | ICD-10-CM | POA: Diagnosis present

## 2023-10-26 DIAGNOSIS — Z833 Family history of diabetes mellitus: Secondary | ICD-10-CM

## 2023-10-26 DIAGNOSIS — E119 Type 2 diabetes mellitus without complications: Secondary | ICD-10-CM

## 2023-10-26 DIAGNOSIS — K760 Fatty (change of) liver, not elsewhere classified: Secondary | ICD-10-CM | POA: Diagnosis present

## 2023-10-26 DIAGNOSIS — A419 Sepsis, unspecified organism: Principal | ICD-10-CM | POA: Diagnosis present

## 2023-10-26 DIAGNOSIS — Z7984 Long term (current) use of oral hypoglycemic drugs: Secondary | ICD-10-CM

## 2023-10-26 DIAGNOSIS — M5441 Lumbago with sciatica, right side: Principal | ICD-10-CM

## 2023-10-26 DIAGNOSIS — Z6827 Body mass index (BMI) 27.0-27.9, adult: Secondary | ICD-10-CM

## 2023-10-26 DIAGNOSIS — I330 Acute and subacute infective endocarditis: Secondary | ICD-10-CM | POA: Diagnosis present

## 2023-10-26 DIAGNOSIS — R109 Unspecified abdominal pain: Secondary | ICD-10-CM | POA: Diagnosis present

## 2023-10-26 DIAGNOSIS — G9340 Encephalopathy, unspecified: Secondary | ICD-10-CM | POA: Insufficient documentation

## 2023-10-26 DIAGNOSIS — I38 Endocarditis, valve unspecified: Secondary | ICD-10-CM

## 2023-10-26 DIAGNOSIS — Z91018 Allergy to other foods: Secondary | ICD-10-CM

## 2023-10-26 DIAGNOSIS — I10 Essential (primary) hypertension: Secondary | ICD-10-CM | POA: Diagnosis present

## 2023-10-26 DIAGNOSIS — K5900 Constipation, unspecified: Secondary | ICD-10-CM | POA: Diagnosis not present

## 2023-10-26 DIAGNOSIS — Z79899 Other long term (current) drug therapy: Secondary | ICD-10-CM

## 2023-10-26 DIAGNOSIS — E785 Hyperlipidemia, unspecified: Secondary | ICD-10-CM | POA: Diagnosis present

## 2023-10-26 DIAGNOSIS — G928 Other toxic encephalopathy: Secondary | ICD-10-CM | POA: Diagnosis present

## 2023-10-26 DIAGNOSIS — M545 Low back pain, unspecified: Secondary | ICD-10-CM | POA: Diagnosis present

## 2023-10-26 DIAGNOSIS — E669 Obesity, unspecified: Secondary | ICD-10-CM | POA: Diagnosis present

## 2023-10-26 DIAGNOSIS — R809 Proteinuria, unspecified: Secondary | ICD-10-CM | POA: Diagnosis present

## 2023-10-26 DIAGNOSIS — G062 Extradural and subdural abscess, unspecified: Secondary | ICD-10-CM | POA: Insufficient documentation

## 2023-10-26 DIAGNOSIS — M48061 Spinal stenosis, lumbar region without neurogenic claudication: Secondary | ICD-10-CM | POA: Diagnosis present

## 2023-10-26 DIAGNOSIS — G061 Intraspinal abscess and granuloma: Secondary | ICD-10-CM | POA: Diagnosis present

## 2023-10-26 DIAGNOSIS — Z794 Long term (current) use of insulin: Secondary | ICD-10-CM

## 2023-10-26 DIAGNOSIS — E871 Hypo-osmolality and hyponatremia: Secondary | ICD-10-CM | POA: Diagnosis present

## 2023-10-26 DIAGNOSIS — X58XXXA Exposure to other specified factors, initial encounter: Secondary | ICD-10-CM | POA: Diagnosis present

## 2023-10-26 DIAGNOSIS — B953 Streptococcus pneumoniae as the cause of diseases classified elsewhere: Secondary | ICD-10-CM | POA: Insufficient documentation

## 2023-10-26 DIAGNOSIS — Z751 Person awaiting admission to adequate facility elsewhere: Secondary | ICD-10-CM

## 2023-10-26 DIAGNOSIS — E1152 Type 2 diabetes mellitus with diabetic peripheral angiopathy with gangrene: Secondary | ICD-10-CM | POA: Diagnosis not present

## 2023-10-26 DIAGNOSIS — D649 Anemia, unspecified: Secondary | ICD-10-CM | POA: Diagnosis present

## 2023-10-26 DIAGNOSIS — M549 Dorsalgia, unspecified: Secondary | ICD-10-CM

## 2023-10-26 LAB — CBC WITH DIFFERENTIAL/PLATELET
Abs Immature Granulocytes: 0.11 10*3/uL — ABNORMAL HIGH (ref 0.00–0.07)
Basophils Absolute: 0 10*3/uL (ref 0.0–0.1)
Basophils Relative: 0 %
Eosinophils Absolute: 0 10*3/uL (ref 0.0–0.5)
Eosinophils Relative: 0 %
HCT: 34.4 % — ABNORMAL LOW (ref 36.0–46.0)
Hemoglobin: 11.1 g/dL — ABNORMAL LOW (ref 12.0–15.0)
Immature Granulocytes: 1 %
Lymphocytes Relative: 12 %
Lymphs Abs: 1.9 10*3/uL (ref 0.7–4.0)
MCH: 27.4 pg (ref 26.0–34.0)
MCHC: 32.3 g/dL (ref 30.0–36.0)
MCV: 84.9 fL (ref 80.0–100.0)
Monocytes Absolute: 1 10*3/uL (ref 0.1–1.0)
Monocytes Relative: 6 %
Neutro Abs: 13.3 10*3/uL — ABNORMAL HIGH (ref 1.7–7.7)
Neutrophils Relative %: 81 %
Platelets: 289 10*3/uL (ref 150–400)
RBC: 4.05 MIL/uL (ref 3.87–5.11)
RDW: 14.1 % (ref 11.5–15.5)
WBC: 16.5 10*3/uL — ABNORMAL HIGH (ref 4.0–10.5)
nRBC: 0 % (ref 0.0–0.2)

## 2023-10-26 LAB — URINALYSIS, ROUTINE W REFLEX MICROSCOPIC
Bilirubin Urine: NEGATIVE
Glucose, UA: >=500 mg/dL — AB
Ketones, ur: 20 mg/dL — AB
Leukocytes,Ua: NEGATIVE
Nitrite: NEGATIVE
Protein, ur: >=300 mg/dL — AB
Specific Gravity, Urine: 1.031 — ABNORMAL HIGH (ref 1.005–1.030)
pH: 5 (ref 5.0–8.0)

## 2023-10-26 LAB — COMPREHENSIVE METABOLIC PANEL WITH GFR
ALT: 29 U/L (ref 0–44)
AST: 27 U/L (ref 15–41)
Albumin: 3 g/dL — ABNORMAL LOW (ref 3.5–5.0)
Alkaline Phosphatase: 89 U/L (ref 38–126)
Anion gap: 13 (ref 5–15)
BUN: 17 mg/dL (ref 8–23)
CO2: 23 mmol/L (ref 22–32)
Calcium: 9.1 mg/dL (ref 8.9–10.3)
Chloride: 99 mmol/L (ref 98–111)
Creatinine, Ser: 0.59 mg/dL (ref 0.44–1.00)
GFR, Estimated: >60 mL/min (ref >60)
Glucose, Bld: 310 mg/dL — ABNORMAL HIGH (ref 70–99)
Potassium: 4.1 mmol/L (ref 3.5–5.1)
Sodium: 135 mmol/L (ref 135–145)
Total Bilirubin: 1.3 mg/dL — ABNORMAL HIGH (ref 0.0–1.2)
Total Protein: 8.3 g/dL — ABNORMAL HIGH (ref 6.5–8.1)

## 2023-10-26 MED ORDER — HYDROMORPHONE HCL 1 MG/ML IJ SOLN
1.0000 mg | INTRAMUSCULAR | Status: DC | PRN
  Administered 2023-10-27 – 2023-10-29 (×5): 1 mg via INTRAMUSCULAR
  Filled 2023-10-26 (×6): qty 1

## 2023-10-26 MED ORDER — METHOCARBAMOL 500 MG PO TABS
750.0000 mg | ORAL_TABLET | Freq: Once | ORAL | Status: AC
  Administered 2023-10-26: 750 mg via ORAL
  Filled 2023-10-26: qty 2

## 2023-10-26 MED ORDER — OXYCODONE-ACETAMINOPHEN 5-325 MG PO TABS
1.0000 | ORAL_TABLET | Freq: Once | ORAL | Status: AC
  Administered 2023-10-26: 1 via ORAL
  Filled 2023-10-26: qty 1

## 2023-10-26 MED ORDER — HYDROMORPHONE HCL 1 MG/ML IJ SOLN
1.0000 mg | Freq: Once | INTRAMUSCULAR | Status: AC
  Administered 2023-10-26: 1 mg via INTRAMUSCULAR
  Filled 2023-10-26: qty 1

## 2023-10-26 NOTE — ED Triage Notes (Signed)
 Pt arrives POV. Ptreports her DC meds were supposed to be 800 mg of Ibuprofen, and she accidentally took 8 (200 mg) of ibuprofen, and 2 oxycodone , and her muscle relaxer. Pt DC on Wednesday with prescriptions. Pt c/o of all over 10/10 pain, and being hardly able to walk. Pt hollering in pain during triage. Pt c/o that "the muscles on her inside feel like they've fallen apart". Pt c/o R side flank pain, and pain when peeing.

## 2023-10-26 NOTE — ED Provider Notes (Signed)
 [place] EMERGENCY DEPARTMENT AT [facility] Provider Note   CSN: [id number] Arrival date & time: 10/26/23  1904     History  Chief Complaint  Patient presents with   Drug Overdose    Kristina Long is a 68 y.o. female.  Patient returns to the ED for further evaluation of persistent, sharp, intense pain in the right flank/low back and RLQ abdomen. Seen 4/30 in the ED. Labs were done which were notable for leukocytosis of 18.4 but without other indication of infection - no fever, normal lactic acid  at that time. CT renal was negative for any acute findings. She has been taking oxycodone  and skelaxin  with some relief, last dose was this morning. Per her husband, she misunderstood her primary care's recommendation to add 800 mg ibuprofen and took 8 tablets around 10:00 am. She has had nothing else for pain today.    The history is provided by the patient and the spouse. No language interpreter was used.  Drug Overdose       Home Medications Prior to Admission medications   Medication Sig Start Date End Date Taking? Authorizing Provider  atorvastatin  (LIPITOR) 10 MG tablet Take 1 tablet (10 mg total) by mouth daily. Patient taking differently: Take 10 mg by mouth at bedtime. 02/06/23   Shamleffer, Ibtehal Jaralla, MD  calcium  carbonate (OS-CAL) 600 MG tablet Take 600 mg by mouth daily.    [provider]  CLARITIN-D 12 HOUR 5-120 MG tablet Take 1 tablet by mouth 2 (two) times daily as needed for allergies.    [provider]  glimepiride  (AMARYL ) 1 MG tablet Take 1 tablet (1 mg total) by mouth daily with breakfast. Patient not taking: Reported on 10/24/2023 02/06/23   Shamleffer, Ibtehal Jaralla, MD  Lancets (ONETOUCH DELICA PLUS LANCET33G) MISC USE AS INSTRUCTED TO CHECK BLOOD SUGAR 2 TIMES PER DAY 09/05/23   Shamleffer, Ibtehal Jaralla, MD  lisinopril (ZESTRIL) 2.5 MG tablet Take 2.5 mg by mouth 2 (two) times daily. 04/25/21   [provider]   metaxalone  (SKELAXIN ) 800 MG tablet Take 1 tablet (800 mg total) by mouth 3 (three) times daily. 10/24/23   Lind Repine, MD  metFORMIN  (GLUCOPHAGE -XR) 500 MG 24 hr tablet Take 2 tablets (1,000 mg total) by mouth 2 (two) times daily with a meal. 2 tabs in afternoon and 2 tabs night Patient taking differently: Take 1,000 mg by mouth 2 (two) times daily with a meal. 02/06/23   Shamleffer, Julian Obey, MD  methocarbamol  (ROBAXIN ) 500 MG tablet Take 1 tablet (500 mg total) by mouth every 8 (eight) hours as needed. Patient taking differently: Take 500 mg by mouth every 8 (eight) hours as needed for muscle spasms. 10/22/23   Mozell Arias, MD  ONETOUCH VERIO test strip USE AS DIRECTED TO TEST BLOOD SUGAR TWICE A DAY 04/17/23   Shamleffer, Ibtehal Jaralla, MD  oxyCODONE -acetaminophen  (PERCOCET/ROXICET) 5-325 MG tablet Take 1 tablet by mouth every 6 (six) hours as needed for severe pain (pain score 7-10). 10/24/23   Lind Repine, MD  sitaGLIPtin  (JANUVIA ) 100 MG tablet Take 1 tablet (100 mg total) by mouth daily. 02/06/23   Shamleffer, Ibtehal Jaralla, MD      Allergies    Blueberry [vaccinium angustifolium]    Review of Systems   Review of Systems  Physical Exam Updated Vital Signs BP (!) 156/65   Pulse 100   Temp 98.2 F (36.8 C) (Oral)   Resp (!) 24   Ht 5\' 10"  (1.778 m)  Wt 90.7 kg   LMP  (LMP Unknown)   SpO2 95%   BMI 28.69 kg/m  Physical Exam Constitutional:      Appearance: She is well-developed.  HENT:     Head: Normocephalic.  Cardiovascular:     Rate and Rhythm: Normal rate and regular rhythm.     Heart sounds: No murmur heard. Pulmonary:     Effort: Pulmonary effort is normal.     Breath sounds: Normal breath sounds. No wheezing, rhonchi or rales.  Abdominal:     Palpations: Abdomen is soft.     Tenderness: There is no abdominal tenderness. There is no guarding or rebound.     Comments: Abdomen is protuberant, completely nontender.   Musculoskeletal:         General: Normal range of motion.     Cervical back: Normal range of motion and neck supple.       Back:     Comments: No midline lumbar tenderness.   Skin:    General: Skin is warm and dry.  Neurological:     General: No focal deficit present.     Mental Status: She is alert and oriented to person, place, and time.     ED Results / Procedures / Treatments   Labs (all labs ordered are listed, but only abnormal results are displayed) Labs Reviewed  CBC WITH DIFFERENTIAL/PLATELET - Abnormal; Notable for the following components:      Result Value   WBC 16.5 (*)    Hemoglobin 11.1 (*)    HCT 34.4 (*)    Neutro Abs 13.3 (*)    Abs Immature Granulocytes 0.11 (*)    All other components within normal limits  COMPREHENSIVE METABOLIC PANEL WITH GFR - Abnormal; Notable for the following components:   Glucose, Bld 310 (*)    Total Protein 8.3 (*)    Albumin 3.0 (*)    Total Bilirubin 1.3 (*)    All other components within normal limits  URINALYSIS, ROUTINE W REFLEX MICROSCOPIC - Abnormal; Notable for the following components:   Specific Gravity, Urine 1.031 (*)    Glucose, UA >=500 (*)    Hgb urine dipstick MODERATE (*)    Ketones, ur 20 (*)    Protein, ur >=300 (*)    Bacteria, UA RARE (*)    All other components within normal limits   Results for orders placed or performed during the hospital encounter of 10/26/23  CBC with Differential   Collection Time: 10/26/23  8:25 PM  Result Value Ref Range   WBC 16.5 (H) 4.0 - 10.5 K/uL   RBC 4.05 3.87 - 5.11 MIL/uL   Hemoglobin 11.1 (L) 12.0 - 15.0 g/dL   HCT 16.1 (L) 09.6 - 04.5 %   MCV 84.9 80.0 - 100.0 fL   MCH 27.4 26.0 - 34.0 pg   MCHC 32.3 30.0 - 36.0 g/dL   RDW 40.9 81.1 - 91.4 %   Platelets 289 150 - 400 K/uL   nRBC 0.0 0.0 - 0.2 %   Neutrophils Relative % 81 %   Neutro Abs 13.3 (H) 1.7 - 7.7 K/uL   Lymphocytes Relative 12 %   Lymphs Abs 1.9 0.7 - 4.0 K/uL   Monocytes Relative 6 %   Monocytes Absolute 1.0 0.1 - 1.0  K/uL   Eosinophils Relative 0 %   Eosinophils Absolute 0.0 0.0 - 0.5 K/uL   Basophils Relative 0 %   Basophils Absolute 0.0 0.0 - 0.1 K/uL   Immature Granulocytes  1 %   Abs Immature Granulocytes 0.11 (H) 0.00 - 0.07 K/uL  Comprehensive metabolic panel   Collection Time: 10/26/23  8:25 PM  Result Value Ref Range   Sodium 135 135 - 145 mmol/L   Potassium 4.1 3.5 - 5.1 mmol/L   Chloride 99 98 - 111 mmol/L   CO2 23 22 - 32 mmol/L   Glucose, Bld 310 (H) 70 - 99 mg/dL   BUN 17 8 - 23 mg/dL   Creatinine, Ser 1.61 0.44 - 1.00 mg/dL   Calcium  9.1 8.9 - 10.3 mg/dL   Total Protein 8.3 (H) 6.5 - 8.1 g/dL   Albumin 3.0 (L) 3.5 - 5.0 g/dL   AST 27 15 - 41 U/L   ALT 29 0 - 44 U/L   Alkaline Phosphatase 89 38 - 126 U/L   Total Bilirubin 1.3 (H) 0.0 - 1.2 mg/dL   GFR, Estimated >09 >60 mL/min   Anion gap 13 5 - 15  Urinalysis, Routine w reflex microscopic -Urine, Clean Catch   Collection Time: 10/26/23  8:27 PM  Result Value Ref Range   Color, Urine YELLOW YELLOW   APPearance CLEAR CLEAR   Specific Gravity, Urine 1.031 (H) 1.005 - 1.030   pH 5.0 5.0 - 8.0   Glucose, UA >=500 (A) NEGATIVE mg/dL   Hgb urine dipstick MODERATE (A) NEGATIVE   Bilirubin Urine NEGATIVE NEGATIVE   Ketones, ur 20 (A) NEGATIVE mg/dL   Protein, ur >=454 (A) NEGATIVE mg/dL   Nitrite NEGATIVE NEGATIVE   Leukocytes,Ua NEGATIVE NEGATIVE   RBC / HPF 6-10 0 - 5 RBC/hpf   WBC, UA 0-5 0 - 5 WBC/hpf   Bacteria, UA RARE (A) NONE SEEN   Squamous Epithelial / HPF 0-5 0 - 5 /HPF   Mucus PRESENT    Hyaline Casts, UA PRESENT     EKG EKG Interpretation Date/Time:  Friday Oct 26 2023 19:32:33 EDT Ventricular Rate:  91 PR Interval:  144 QRS Duration:  83 QT Interval:  366 QTC Calculation: 451 R Axis:   43  Text Interpretation: Sinus rhythm Borderline T abnormalities, diffuse leads Baseline wander in lead(s) III no sig change from previous Confirmed by Wynetta Heckle [phone]) on 10/26/2023 8:10:36 PM  Radiology No  results found.  Procedures Procedures    Medications Ordered in ED Medications  oxyCODONE -acetaminophen  (PERCOCET/ROXICET) 5-325 MG per tablet 1 tablet (1 tablet Oral Given 10/26/23 2016)  methocarbamol  (ROBAXIN ) tablet 750 mg (750 mg Oral Given 10/26/23 2016)  HYDROmorphone  (DILAUDID ) injection 1 mg (1 mg Intramuscular Given 10/26/23 2129)    ED Course/ Medical Decision Making/ A&P                                 Medical Decision Making This patient presents to the ED for concern of severe back pain, this involves an extensive number of treatment options, and is a complaint that carries with it a high risk of complications and morbidity.  The differential diagnosis includes radiculopathy, kidney stone, MSK back pain including spasm, ruptured AAA, dissection   Co morbidities that complicate the patient evaluation  T2DM, HLD, HTN   Additional history obtained:  Additional history and/or information obtained from chart review, notable for ED visits for same complaint 4/28 and 4/30. Notable for negative CT abd/pel w/CM 4/28, repeat CT renal 4/30   Lab Tests:  I Ordered, and personally interpreted labs.  The pertinent results include:  glucose elevated at  310 (no acidosis); leukocytosis of 16.5 (18.4, 19.7), hgb 11.5.      Cardiac Monitoring:  The patient was maintained on a cardiac monitor.  I personally viewed and interpreted the cardiac monitored which showed an underlying rhythm of: n/a   Medicines ordered and prescription drug management:  I ordered medication including oxycodone , robaxin   for pain Reevaluation of the patient after these medicines showed that the patient stayed the same  IM Dilaudid  added Recheck - patient appears slightly more comfortable.    Test Considered:  Consider radicular/neuropathic pain - MRI may be helpful.    Critical Interventions:  N/a   Consultations Obtained:  I requested consultation with the n/a,  and discussed lab and  imaging findings as well as pertinent plan - they recommend: n/a   Problem List / ED Course:  Here for the 3rd time in 6 days for severe, intense right low back pain that radiates to RLQ abdomen. CT's (one with CM and one without) both negative.  Leukocytosis of unclear significance.  Exam has no neuro deficits, however given negative extensive ED evaluations, consider sciatica vs radiculopathy as source of pain. Patient has been seen by Dr. Daivd Dub.   23:00 - re-evaluation: the patient's pain is improved, however, she still has pain when moving about on the bed. I had a discussed with patient's husband who expresses heightened concern regarding taking the patient home given the amount of pain she has been in. Discussed MRI as available in the morning. He requests that she remain in the ED until morning when that study can be done. As we have a low census currently, this is felt an acceptable alternative to her returning from [place] in the morning. PRN pain medication will be ordered.    After the interventions noted above, I reevaluated the patient and found that they have :improved   Social Determinants of Health:  Lives in [place] with husband   Disposition:  After consideration of the diagnostic results and the patients response to treatment, I feel that the patient would benefit from bording overnight for MRI in a.m.   Amount and/or Complexity of Data Reviewed Labs: ordered.  Risk Prescription drug management.           Final Clinical Impression(s) / ED Diagnoses Final diagnoses:  Acute right-sided low back pain with right-sided sciatica  Intractable back pain    Rx / DC Orders ED Discharge Orders     None         Mandy Second, PA-C 10/26/23 2317    Wynetta Heckle, MD 10/26/23 2340

## 2023-10-26 NOTE — ED Provider Notes (Signed)
 I provided a substantive portion of the care of this patient.  I personally made/approved the management plan for this patient and take responsibility for the patient management.  EKG Interpretation Date/Time:  Friday Oct 26 2023 19:32:33 EDT Ventricular Rate:  91 PR Interval:  144 QRS Duration:  83 QT Interval:  366 QTC Calculation: 451 R Axis:   43  Text Interpretation: Sinus rhythm Borderline T abnormalities, diffuse leads Baseline wander in lead(s) III no sig change from previous Confirmed by Wynetta Heckle [phone]) on 10/26/2023 8:10:36 PM   Patient had severe right lower back pain that tracks across the SI region over her hip and then radiates down the leg.  At times she feels that the leg is giving out on her.  Patient is alert and appropriate no confusion.  No respiratory distress.  No visible soft tissue abnormalities.  Distal pulses 2+ and strong.  Foot is warm and dry.  No peripheral edema.  At this time I can put the legs for range of motion she can push against resistance.  Patient has had oral pain control.  She still continues to report pain but is somewhat improved.  At this time we will try to maximize pain control and retest for gait to determine if MRI and needed today.   Wynetta Heckle, MD 10/26/23 2120

## 2023-10-27 ENCOUNTER — Emergency Department (HOSPITAL_COMMUNITY)

## 2023-10-27 ENCOUNTER — Inpatient Hospital Stay (HOSPITAL_COMMUNITY)

## 2023-10-27 DIAGNOSIS — X58XXXA Exposure to other specified factors, initial encounter: Secondary | ICD-10-CM | POA: Diagnosis present

## 2023-10-27 DIAGNOSIS — G928 Other toxic encephalopathy: Secondary | ICD-10-CM | POA: Diagnosis present

## 2023-10-27 DIAGNOSIS — A419 Sepsis, unspecified organism: Secondary | ICD-10-CM | POA: Diagnosis present

## 2023-10-27 DIAGNOSIS — R739 Hyperglycemia, unspecified: Secondary | ICD-10-CM | POA: Diagnosis not present

## 2023-10-27 DIAGNOSIS — G062 Extradural and subdural abscess, unspecified: Secondary | ICD-10-CM | POA: Diagnosis not present

## 2023-10-27 DIAGNOSIS — K760 Fatty (change of) liver, not elsewhere classified: Secondary | ICD-10-CM | POA: Diagnosis present

## 2023-10-27 DIAGNOSIS — G9340 Encephalopathy, unspecified: Secondary | ICD-10-CM | POA: Diagnosis not present

## 2023-10-27 DIAGNOSIS — Z79899 Other long term (current) drug therapy: Secondary | ICD-10-CM | POA: Diagnosis not present

## 2023-10-27 DIAGNOSIS — Z4889 Encounter for other specified surgical aftercare: Secondary | ICD-10-CM | POA: Diagnosis not present

## 2023-10-27 DIAGNOSIS — I351 Nonrheumatic aortic (valve) insufficiency: Secondary | ICD-10-CM | POA: Diagnosis not present

## 2023-10-27 DIAGNOSIS — Z7984 Long term (current) use of oral hypoglycemic drugs: Secondary | ICD-10-CM | POA: Diagnosis not present

## 2023-10-27 DIAGNOSIS — I330 Acute and subacute infective endocarditis: Secondary | ICD-10-CM | POA: Diagnosis present

## 2023-10-27 DIAGNOSIS — S31000A Unspecified open wound of lower back and pelvis without penetration into retroperitoneum, initial encounter: Secondary | ICD-10-CM | POA: Diagnosis not present

## 2023-10-27 DIAGNOSIS — E119 Type 2 diabetes mellitus without complications: Secondary | ICD-10-CM | POA: Diagnosis not present

## 2023-10-27 DIAGNOSIS — R7881 Bacteremia: Secondary | ICD-10-CM | POA: Diagnosis not present

## 2023-10-27 DIAGNOSIS — T50915A Adverse effect of multiple unspecified drugs, medicaments and biological substances, initial encounter: Secondary | ICD-10-CM | POA: Diagnosis present

## 2023-10-27 DIAGNOSIS — R109 Unspecified abdominal pain: Secondary | ICD-10-CM | POA: Diagnosis present

## 2023-10-27 DIAGNOSIS — D649 Anemia, unspecified: Secondary | ICD-10-CM | POA: Diagnosis present

## 2023-10-27 DIAGNOSIS — M4804 Spinal stenosis, thoracic region: Secondary | ICD-10-CM | POA: Diagnosis present

## 2023-10-27 DIAGNOSIS — E1165 Type 2 diabetes mellitus with hyperglycemia: Secondary | ICD-10-CM | POA: Diagnosis present

## 2023-10-27 DIAGNOSIS — Z9889 Other specified postprocedural states: Secondary | ICD-10-CM | POA: Diagnosis not present

## 2023-10-27 DIAGNOSIS — M549 Dorsalgia, unspecified: Secondary | ICD-10-CM | POA: Diagnosis present

## 2023-10-27 DIAGNOSIS — M4316 Spondylolisthesis, lumbar region: Secondary | ICD-10-CM | POA: Diagnosis present

## 2023-10-27 DIAGNOSIS — K5901 Slow transit constipation: Secondary | ICD-10-CM | POA: Diagnosis not present

## 2023-10-27 DIAGNOSIS — E871 Hypo-osmolality and hyponatremia: Secondary | ICD-10-CM | POA: Diagnosis present

## 2023-10-27 DIAGNOSIS — D62 Acute posthemorrhagic anemia: Secondary | ICD-10-CM | POA: Diagnosis not present

## 2023-10-27 DIAGNOSIS — I340 Nonrheumatic mitral (valve) insufficiency: Secondary | ICD-10-CM | POA: Diagnosis not present

## 2023-10-27 DIAGNOSIS — I059 Rheumatic mitral valve disease, unspecified: Secondary | ICD-10-CM | POA: Diagnosis not present

## 2023-10-27 DIAGNOSIS — E1152 Type 2 diabetes mellitus with diabetic peripheral angiopathy with gangrene: Secondary | ICD-10-CM | POA: Diagnosis not present

## 2023-10-27 DIAGNOSIS — R809 Proteinuria, unspecified: Secondary | ICD-10-CM | POA: Diagnosis present

## 2023-10-27 DIAGNOSIS — Z833 Family history of diabetes mellitus: Secondary | ICD-10-CM | POA: Diagnosis not present

## 2023-10-27 DIAGNOSIS — G061 Intraspinal abscess and granuloma: Secondary | ICD-10-CM | POA: Diagnosis present

## 2023-10-27 DIAGNOSIS — M48061 Spinal stenosis, lumbar region without neurogenic claudication: Secondary | ICD-10-CM | POA: Diagnosis present

## 2023-10-27 DIAGNOSIS — M545 Low back pain, unspecified: Secondary | ICD-10-CM | POA: Diagnosis present

## 2023-10-27 DIAGNOSIS — B953 Streptococcus pneumoniae as the cause of diseases classified elsewhere: Secondary | ICD-10-CM | POA: Diagnosis present

## 2023-10-27 DIAGNOSIS — M5441 Lumbago with sciatica, right side: Secondary | ICD-10-CM | POA: Diagnosis not present

## 2023-10-27 DIAGNOSIS — I339 Acute and subacute endocarditis, unspecified: Secondary | ICD-10-CM | POA: Diagnosis not present

## 2023-10-27 DIAGNOSIS — Z91018 Allergy to other foods: Secondary | ICD-10-CM | POA: Diagnosis not present

## 2023-10-27 DIAGNOSIS — Z794 Long term (current) use of insulin: Secondary | ICD-10-CM | POA: Diagnosis not present

## 2023-10-27 DIAGNOSIS — E785 Hyperlipidemia, unspecified: Secondary | ICD-10-CM | POA: Diagnosis present

## 2023-10-27 DIAGNOSIS — M5442 Lumbago with sciatica, left side: Secondary | ICD-10-CM | POA: Diagnosis not present

## 2023-10-27 DIAGNOSIS — K5900 Constipation, unspecified: Secondary | ICD-10-CM | POA: Diagnosis not present

## 2023-10-27 DIAGNOSIS — I10 Essential (primary) hypertension: Secondary | ICD-10-CM | POA: Diagnosis present

## 2023-10-27 DIAGNOSIS — E669 Obesity, unspecified: Secondary | ICD-10-CM | POA: Diagnosis present

## 2023-10-27 LAB — CBG MONITORING, ED
Glucose-Capillary: 318 mg/dL — ABNORMAL HIGH (ref 70–99)
Glucose-Capillary: 350 mg/dL — ABNORMAL HIGH (ref 70–99)

## 2023-10-27 LAB — HEMOGLOBIN A1C
Hgb A1c MFr Bld: 8.7 % — ABNORMAL HIGH (ref 4.8–5.6)
Mean Plasma Glucose: 202.99 mg/dL

## 2023-10-27 LAB — GLUCOSE, CAPILLARY: Glucose-Capillary: 357 mg/dL — ABNORMAL HIGH (ref 70–99)

## 2023-10-27 MED ORDER — ONDANSETRON HCL 4 MG/2ML IJ SOLN: 4.0000 mg | Freq: Four times a day (QID) | INTRAMUSCULAR | Status: DC | PRN

## 2023-10-27 MED ORDER — FENTANYL CITRATE PF 50 MCG/ML IJ SOSY
50.0000 ug | PREFILLED_SYRINGE | Freq: Once | INTRAMUSCULAR | Status: DC
  Filled 2023-10-27: qty 1

## 2023-10-27 MED ORDER — POLYETHYLENE GLYCOL 3350 17 G PO PACK: 17.0000 g | PACK | Freq: Every day | ORAL | Status: DC | PRN

## 2023-10-27 MED ORDER — METHOCARBAMOL 500 MG PO TABS: 750.0000 mg | ORAL_TABLET | Freq: Three times a day (TID) | ORAL | Status: DC

## 2023-10-27 MED ORDER — ONDANSETRON HCL 4 MG PO TABS: 4.0000 mg | ORAL_TABLET | Freq: Four times a day (QID) | ORAL | Status: DC | PRN

## 2023-10-27 MED ORDER — LORAZEPAM 1 MG PO TABS
1.0000 mg | ORAL_TABLET | Freq: Once | ORAL | Status: AC
  Administered 2023-10-27: 1 mg via ORAL
  Filled 2023-10-27: qty 1

## 2023-10-27 MED ORDER — ACETAMINOPHEN 650 MG RE SUPP: 650.0000 mg | Freq: Four times a day (QID) | RECTAL | Status: DC | PRN

## 2023-10-27 MED ORDER — LIDOCAINE 5 % EX PTCH
1.0000 | MEDICATED_PATCH | Freq: Every day | CUTANEOUS | Status: DC
  Administered 2023-10-27 – 2023-10-28 (×2): 1 via TRANSDERMAL
  Filled 2023-10-27 (×2): qty 1

## 2023-10-27 MED ORDER — ACETAMINOPHEN 325 MG PO TABS: 650.0000 mg | ORAL_TABLET | Freq: Four times a day (QID) | ORAL | Status: DC | PRN

## 2023-10-27 MED ORDER — METHOCARBAMOL 1000 MG/10ML IJ SOLN
500.0000 mg | Freq: Four times a day (QID) | INTRAMUSCULAR | Status: DC
  Administered 2023-10-27 – 2023-10-29 (×6): 500 mg via INTRAVENOUS
  Filled 2023-10-27 (×6): qty 10

## 2023-10-27 MED ORDER — KETOROLAC TROMETHAMINE 30 MG/ML IJ SOLN
15.0000 mg | Freq: Four times a day (QID) | INTRAMUSCULAR | Status: DC
  Administered 2023-10-27 – 2023-10-29 (×8): 15 mg via INTRAVENOUS
  Filled 2023-10-27 (×8): qty 1

## 2023-10-27 MED ORDER — INSULIN ASPART 100 UNIT/ML IJ SOLN
0.0000 [IU] | Freq: Every day | INTRAMUSCULAR | Status: DC
  Administered 2023-10-27: 5 [IU] via SUBCUTANEOUS
  Administered 2023-10-28: 2 [IU] via SUBCUTANEOUS
  Administered 2023-10-29: 3 [IU] via SUBCUTANEOUS
  Administered 2023-11-01: 2 [IU] via SUBCUTANEOUS
  Administered 2023-11-02 – 2023-11-05 (×2): 3 [IU] via SUBCUTANEOUS
  Filled 2023-10-27: qty 0.05

## 2023-10-27 MED ORDER — INSULIN ASPART 100 UNIT/ML IJ SOLN
0.0000 [IU] | Freq: Three times a day (TID) | INTRAMUSCULAR | Status: DC
  Administered 2023-10-27: 11 [IU] via SUBCUTANEOUS
  Administered 2023-10-28: 15 [IU] via SUBCUTANEOUS
  Administered 2023-10-28: 11 [IU] via SUBCUTANEOUS
  Administered 2023-10-28: 15 [IU] via SUBCUTANEOUS
  Administered 2023-10-29: 11 [IU] via SUBCUTANEOUS
  Administered 2023-10-30: 5 [IU] via SUBCUTANEOUS
  Administered 2023-10-30: 3 [IU] via SUBCUTANEOUS
  Administered 2023-10-30: 11 [IU] via SUBCUTANEOUS
  Administered 2023-10-31: 5 [IU] via SUBCUTANEOUS
  Administered 2023-10-31: 11 [IU] via SUBCUTANEOUS
  Filled 2023-10-27: qty 0.15

## 2023-10-27 MED ORDER — HEPARIN SODIUM (PORCINE) 5000 UNIT/ML IJ SOLN
5000.0000 [IU] | Freq: Three times a day (TID) | INTRAMUSCULAR | Status: DC
  Administered 2023-10-27 – 2023-10-29 (×5): 5000 [IU] via SUBCUTANEOUS
  Filled 2023-10-27 (×5): qty 1

## 2023-10-27 MED ORDER — METHYLPREDNISOLONE SODIUM SUCC 125 MG IJ SOLR
125.0000 mg | Freq: Once | INTRAMUSCULAR | Status: AC
  Administered 2023-10-27: 125 mg via INTRAVENOUS
  Filled 2023-10-27: qty 2

## 2023-10-27 NOTE — ED Notes (Signed)
 [facility] called.

## 2023-10-27 NOTE — Progress Notes (Signed)
   10/27/23 1801  Assess: MEWS Score  Temp 99.3 F (37.4 C)  BP (!) 175/80  MAP (mmHg) 106  Pulse Rate (!) 108  Resp 17  SpO2 97 %  O2 Device Room Air  Assess: MEWS Score  MEWS Temp 0  MEWS Systolic 0  MEWS Pulse 1  MEWS RR 0  MEWS LOC 0  MEWS Score 1  MEWS Score Color Green  Assess: if the MEWS score is Yellow or Red  Were vital signs accurate and taken at a resting state? Yes  Does the patient meet 2 or more of the SIRS criteria? No  MEWS guidelines implemented  Yes, yellow  Treat  MEWS Interventions Considered administering scheduled or prn medications/treatments as ordered  Take Vital Signs  Increase Vital Sign Frequency  Yellow: Q2hr x1, continue Q4hrs until patient remains green for 12hrs  Escalate  MEWS: Escalate Yellow: Discuss with charge nurse and consider notifying provider and/or RRT  Notify: Charge Nurse/RN  Name of Charge Nurse/RN Notified Education officer, community  Provider Notification  Provider Name/Title Dr. Mikle Alexanders  Date Provider Notified 10/27/23  Time Provider Notified 1849  Method of Notification Page (secure chat)  Provider response See new orders  Date of Provider Response 10/27/23  Time of Provider Response 1849  Assess: SIRS CRITERIA  SIRS Temperature  0  SIRS Respirations  0  SIRS Pulse 1  SIRS WBC 0  SIRS Score Sum  1

## 2023-10-27 NOTE — ED Notes (Signed)
 Pt had repeated events of getting out of bed despite safety measures taken. With these event, pt repeatedly urinating in the trash can/floor. In this event, pt had taken off her gown and was wandering into the hall afterwards. Pt assisted back into the bed and cleaned up each time. Pt instructed each time to use the call light when she needs assistance and not get out of bed on her own due to fall risk; pt continues to not comply. Bedside commode placed after last incident.

## 2023-10-27 NOTE — H&P (Addendum)
 History and Physical    Kristina Long XBM:[id number] DOB: [birth year] DOA: 10/26/2023  I have briefly reviewed the patient's prior medical records in [facility]  PCP: Arlys Berke, MD  Patient coming from: home  Chief Complaint: back pain  HPI: Kristina Long is a 68 y.o. female with medical history significant of DM.  Unable to get history due to AMS.  History is per husband and chart review:  1 week of low back pain (also some right flank radiating down right leg).  She could walk with assistance in the beginning but now can not per husband.  At baseline walks on own and does own ADL.  She is retired  4/28: seen in ED: flank pain: U/A done, given muscle relaxer and sent home-- progressively worse so came back on  4/30: with  low grade fever about 100 per husband, flank/back pain, difficulty walking, CT scan renal showed no hydronephrosis- sent home 5/2: came back to ED after she accidentally took 8 (200 mg) of ibuprofen, and 2 oxycodone , and her muscle relaxer.  Pt c/o of all over 10/10 pain, and being hardly able to walk. Pt yelling in pain during triage. Pt c/o that "the muscles on her inside feel like they've fallen apart" per triage note -remained in ED overnight for MRI in the AM-- had issues in the ER overnight with AMS -- refusing to follow instructions, and being found wandering into the hallways? -Patient was unable to tolerate MRI this AM and Dr. Leighton Punches asked for admission as patient is reportedly unable to walk and will need to get MRI under anesthesia at [facility].     Currently in the ER and is able to answer questions like name and DOB but quickly falls asleep.  Unable to give information about back pain or other symptoms.  Currently denies any back pain but when I sit her up she grimaces. Husband denies bowel or bladder issues  Labs were concerning for elevated WBCs and elevated blood sugars.  CT scan renals showed: Grade 1 L4-L5 anterolisthesis. No acute osseous pathology  done 4/30 Blood cultures from 4/30 NGTD, U/A not suggestive of infection  Review of Systems: unable to due ROS due to AMS  Past Medical History:  Diagnosis Date   Allergy    Diabetes mellitus without complication (HCC)     History reviewed. No pertinent surgical history.   reports that she has never smoked. She has never used smokeless tobacco. She reports that she does not drink alcohol and does not use drugs.  Allergies  Allergen Reactions   Blueberry [Vaccinium Angustifolium] Swelling and Other (See Comments)    Lips swell    Family History  Problem Relation Age of Onset   Diabetes Father    Diabetes Maternal Grandmother     Prior to Admission medications   Medication Sig Start Date End Date Taking? Authorizing Provider  atorvastatin  (LIPITOR) 10 MG tablet Take 1 tablet (10 mg total) by mouth daily. Patient taking differently: Take 10 mg by mouth at bedtime. 02/06/23  Yes Shamleffer, Ibtehal Jaralla, MD  calcium  carbonate (OS-CAL) 600 MG tablet Take 600 mg by mouth daily.   Yes [provider]  CLARITIN-D 12 HOUR 5-120 MG tablet Take 1 tablet by mouth 2 (two) times daily as needed for allergies.   Yes [provider]  lisinopril (ZESTRIL) 2.5 MG tablet Take 2.5 mg by mouth 2 (two) times daily. 04/25/21  Yes [provider]  metaxalone  (SKELAXIN ) 800 MG tablet Take  1 tablet (800 mg total) by mouth 3 (three) times daily. 10/24/23  Yes Lind Repine, MD  metFORMIN  (GLUCOPHAGE -XR) 500 MG 24 hr tablet Take 2 tablets (1,000 mg total) by mouth 2 (two) times daily with a meal. 2 tabs in afternoon and 2 tabs night Patient taking differently: Take 1,000 mg by mouth 2 (two) times daily with a meal. 02/06/23  Yes Shamleffer, Ibtehal Jaralla, MD  methocarbamol  (ROBAXIN ) 500 MG tablet Take 1 tablet (500 mg total) by mouth every 8 (eight) hours as needed. Patient taking differently: Take 500 mg by mouth every 8 (eight) hours as needed for muscle spasms. 10/22/23  Yes  Mozell Arias, MD  oxyCODONE -acetaminophen  (PERCOCET/ROXICET) 5-325 MG tablet Take 1 tablet by mouth every 6 (six) hours as needed for severe pain (pain score 7-10). 10/24/23  Yes Lind Repine, MD  sitaGLIPtin  (JANUVIA ) 100 MG tablet Take 1 tablet (100 mg total) by mouth daily. 02/06/23  Yes Shamleffer, Ibtehal Jaralla, MD  Lancets (ONETOUCH DELICA PLUS LANCET33G) MISC USE AS INSTRUCTED TO CHECK BLOOD SUGAR 2 TIMES PER DAY 09/05/23   Shamleffer, Julian Obey, MD  ONETOUCH VERIO test strip USE AS DIRECTED TO TEST BLOOD SUGAR TWICE A DAY 04/17/23   Shamleffer, Julian Obey, MD    Physical Exam: Vitals:   10/27/23 0405 10/27/23 0736 10/27/23 1141 10/27/23 1142  BP: (!) 183/119 (!) 165/97  (!) 160/96  Pulse: (!) 105 (!) 105  (!) 107  Resp: 20 17  (!) 24  Temp: 98.7 F (37.1 C) 98.1 F (36.7 C) 99.2 F (37.3 C)   TempSrc:   Oral   SpO2: 97% 96%  99%  Weight:      Height:          Constitutional: very sleepy but will awaken and give name and birth date-- can not provide any information about prior ER visits or current situation Eyes: PERRL, lids and conjunctivae normal ENMT: Mucous membranes are moist. Posterior pharynx clear of any exudate or lesions Neck: normal, supple, no masses, no thyromegaly Respiratory: clear to auscultation bilaterally, no wheezing, no crackles. Normal respiratory effort. No accessory muscle use.  Cardiovascular: Regular rate and rhythm, no murmurs / rubs / gallops. No extremity edema. 2+ pedal pulses.  Abdomen: no tenderness, no masses palpated. Bowel sounds positive.  Musculoskeletal: no clubbing / cyanosis. Normal muscle tone.  Skin: no rashes, lesions, ulcers. No induration Neurologic: uncooperative with exam but moving all 4 ext equally    Labs on Admission: I have personally reviewed following labs and imaging studies  CBC: Recent Labs  Lab 10/22/23 0735 10/24/23 1517 10/26/23 2025  WBC 19.7* 18.4* 16.5*  NEUTROABS 17.3* 15.8* 13.3*   HGB 11.5* 11.7* 11.1*  HCT 35.2* 37.7 34.4*  MCV 84.0 87.3 84.9  PLT 252 305 289   Basic Metabolic Panel: Recent Labs  Lab 10/22/23 0735 10/24/23 1517 10/26/23 2025  NA 138 134* 135  K 4.1 3.4* 4.1  CL 100 98 99  CO2 21* 22 23  GLUCOSE 272* 282* 310*  BUN 13 18 17   CREATININE 0.79 0.52 0.59  CALCIUM  10.0 9.2 9.1   GFR: Estimated Creatinine Clearance: 81.1 mL/min (by C-G formula based on SCr of 0.59 mg/dL). Liver Function Tests: Recent Labs  Lab 10/22/23 0735 10/24/23 1517 10/26/23 2025  AST 25 25 27   ALT 28 30 29   ALKPHOS 100 93 89  BILITOT 0.6 0.8 1.3*  PROT 8.4* 8.6* 8.3*  ALBUMIN 4.1 3.4* 3.0*   Recent Labs  Lab 10/22/23 0735  LIPASE 19   No results for input(s): "AMMONIA" in the last 168 hours. Coagulation Profile: Recent Labs  Lab 10/24/23 1517  INR 1.2   Cardiac Enzymes: No results for input(s): "CKTOTAL", "CKMB", "CKMBINDEX", "TROPONINI" in the last 168 hours. BNP (last 3 results) No results for input(s): "PROBNP" in the last 8760 hours. HbA1C: No results for input(s): "HGBA1C" in the last 72 hours. CBG: Recent Labs  Lab 10/24/23 1634 10/27/23 1319  GLUCAP 267* 318*   Lipid Profile: No results for input(s): "CHOL", "HDL", "LDLCALC", "TRIG", "CHOLHDL", "LDLDIRECT" in the last 72 hours. Thyroid  Function Tests: No results for input(s): "TSH", "T4TOTAL", "FREET4", "T3FREE", "THYROIDAB" in the last 72 hours. Anemia Panel: No results for input(s): "VITAMINB12", "FOLATE", "FERRITIN", "TIBC", "IRON", "RETICCTPCT" in the last 72 hours. Urine analysis:    Component Value Date/Time   COLORURINE YELLOW 10/26/2023 2027   APPEARANCEUR CLEAR 10/26/2023 2027   LABSPEC 1.031 (H) 10/26/2023 2027   PHURINE 5.0 10/26/2023 2027   GLUCOSEU >=500 (A) 10/26/2023 2027   HGBUR MODERATE (A) 10/26/2023 2027   BILIRUBINUR NEGATIVE 10/26/2023 2027   KETONESUR 20 (A) 10/26/2023 2027   PROTEINUR >=300 (A) 10/26/2023 2027   NITRITE NEGATIVE 10/26/2023 2027    LEUKOCYTESUR NEGATIVE 10/26/2023 2027     Radiological Exams on Admission: No results found.    Assessment/Plan Principal Problem:   Low back pain  Severe back pain/flank pain now with AMS most likely from pain meds/ativan (per Dr. Leighton Punches was normal the day prior when seen by him) -scheduled toradol  and robaxin -- will try to avoid sedating opioids if able as the may worsen her mental status and limit her exam/history -tylenol  PRN -MRI under anesthesia ordered -trend labs, hold on abx for now -given steroids x 1 in ER on 5/3 -check x ray or lower back when able- pelvis and lumbar spine as not yet done (last imaging was 4/30) -delirium precautions  Type 2 DM with uncontrolled blood sugar -[name]  DVT prophylaxis: lovenox  Code Status: full code  Family Communication: called husband Disposition Plan: admit to [facility] for MRI under anesthesia Consults called: none yet   Enrigue Harvard [facility]   How to contact the [facility] Attending or Consulting provider 7A - 7P or covering provider during after hours 7P -7A, for this patient?  Check the care team in [facility] and look for a) attending/consulting TRH provider listed and b) the TRH team listed Log into [email or web address] and use [place]'s universal password to access. If you do not have the password, please contact the hospital operator. Locate the TRH provider you are looking for under [facility] and page to a number that you can be directly reached. If you still have difficulty reaching the provider, please page the [facility] (Director on Call) for the Hospitalists listed on amion for assistance.  10/27/2023, 3:37 PM

## 2023-10-27 NOTE — ED Notes (Signed)
 Pt placed on bedpan. While getting pt vital signs pt refuses to leave monitoring equipment on. While obtain BP pt rolling around in bed refusing to lay still.

## 2023-10-27 NOTE — ED Notes (Signed)
 Pt up out of bed without using call bell provided stating she needed to use restroom. Pt assisted back to bed so I could get BSC. When I entered with BSC pt stated she couldn't move. I reminded pt she could move with assistance as she just walked to the door. Pt stated she could not use BSC. I instructed  that her options were either bedpan or BSC do to lack of mobility that she stated she had.

## 2023-10-27 NOTE — ED Provider Notes (Signed)
 Patient signed out to me by Dr. Morris Arch pending results of MRI.  Patient seen by me several days ago for similar symptoms of flank pain has been going on for several days.  Plan was to get MRI which patient was able to tolerate despite receiving Ativan.  Her neurological send at this time shows no foot drop.  She is able to move her lower extremities.  They attempted to have her bear weight unsuccessfully.  Suspect she has some type of nerve impingement.  She does not have any bowel or bladder dysfunction.  Due to her severe pain as well as multiple ED visits patient will require mission for pain control with MRI as an inpatient   Kristina Repine, MD 10/27/23 1434

## 2023-10-28 ENCOUNTER — Inpatient Hospital Stay (HOSPITAL_COMMUNITY)

## 2023-10-28 DIAGNOSIS — M5441 Lumbago with sciatica, right side: Secondary | ICD-10-CM

## 2023-10-28 DIAGNOSIS — M5442 Lumbago with sciatica, left side: Secondary | ICD-10-CM | POA: Diagnosis not present

## 2023-10-28 DIAGNOSIS — G9340 Encephalopathy, unspecified: Secondary | ICD-10-CM | POA: Insufficient documentation

## 2023-10-28 LAB — BASIC METABOLIC PANEL WITH GFR
Anion gap: 16 — ABNORMAL HIGH (ref 5–15)
BUN: 21 mg/dL (ref 8–23)
CO2: 22 mmol/L (ref 22–32)
Calcium: 9.5 mg/dL (ref 8.9–10.3)
Chloride: 98 mmol/L (ref 98–111)
Creatinine, Ser: 0.87 mg/dL (ref 0.44–1.00)
GFR, Estimated: >60 mL/min (ref >60)
Glucose, Bld: 357 mg/dL — ABNORMAL HIGH (ref 70–99)
Potassium: 3.7 mmol/L (ref 3.5–5.1)
Sodium: 136 mmol/L (ref 135–145)

## 2023-10-28 LAB — GLUCOSE, CAPILLARY
Glucose-Capillary: 382 mg/dL — ABNORMAL HIGH (ref 70–99)
Glucose-Capillary: 396 mg/dL — ABNORMAL HIGH (ref 70–99)
Glucose-Capillary: 304 mg/dL — ABNORMAL HIGH (ref 70–99)
Glucose-Capillary: 243 mg/dL — ABNORMAL HIGH (ref 70–99)

## 2023-10-28 LAB — C-REACTIVE PROTEIN: CRP: 26.8 mg/dL — ABNORMAL HIGH (ref <1.0)

## 2023-10-28 LAB — CBC
HCT: 37.4 % (ref 36.0–46.0)
Hemoglobin: 11.9 g/dL — ABNORMAL LOW (ref 12.0–15.0)
MCH: 26.9 pg (ref 26.0–34.0)
MCHC: 31.8 g/dL (ref 30.0–36.0)
MCV: 84.4 fL (ref 80.0–100.0)
Platelets: 328 10*3/uL (ref 150–400)
RBC: 4.43 MIL/uL (ref 3.87–5.11)
RDW: 14 % (ref 11.5–15.5)
WBC: 21.3 10*3/uL — ABNORMAL HIGH (ref 4.0–10.5)
nRBC: 0 % (ref 0.0–0.2)

## 2023-10-28 LAB — SEDIMENTATION RATE: Sed Rate: 105 mm/h — ABNORMAL HIGH (ref 0–22)

## 2023-10-28 MED ORDER — INSULIN ASPART 100 UNIT/ML IJ SOLN
5.0000 [IU] | Freq: Three times a day (TID) | INTRAMUSCULAR | Status: DC
  Administered 2023-10-28 (×2): 5 [IU] via SUBCUTANEOUS

## 2023-10-28 MED ORDER — POLYETHYLENE GLYCOL 3350 17 G PO PACK
17.0000 g | PACK | Freq: Two times a day (BID) | ORAL | Status: DC | PRN
  Administered 2023-10-31: 17 g via ORAL
  Filled 2023-10-28: qty 1

## 2023-10-28 MED ORDER — AMLODIPINE BESYLATE 10 MG PO TABS
10.0000 mg | ORAL_TABLET | Freq: Every day | ORAL | Status: DC
  Administered 2023-10-28 – 2023-11-07 (×10): 10 mg via ORAL
  Filled 2023-10-28 (×11): qty 1

## 2023-10-28 MED ORDER — LORATADINE 10 MG PO TABS
10.0000 mg | ORAL_TABLET | Freq: Every day | ORAL | Status: DC | PRN
  Filled 2023-10-28: qty 1

## 2023-10-28 MED ORDER — ACETAMINOPHEN 500 MG PO TABS
1000.0000 mg | ORAL_TABLET | Freq: Four times a day (QID) | ORAL | Status: DC
  Administered 2023-10-28 (×3): 1000 mg via ORAL
  Filled 2023-10-28 (×5): qty 2

## 2023-10-28 MED ORDER — POLYETHYLENE GLYCOL 3350 17 G PO PACK
17.0000 g | PACK | Freq: Two times a day (BID) | ORAL | Status: AC
  Administered 2023-10-28 (×2): 17 g via ORAL
  Filled 2023-10-28 (×2): qty 1

## 2023-10-28 MED ORDER — INSULIN GLARGINE-YFGN 100 UNIT/ML ~~LOC~~ SOLN
15.0000 [IU] | Freq: Every day | SUBCUTANEOUS | Status: DC
  Administered 2023-10-28: 15 [IU] via SUBCUTANEOUS
  Filled 2023-10-28 (×2): qty 0.15

## 2023-10-28 MED ORDER — SENNOSIDES-DOCUSATE SODIUM 8.6-50 MG PO TABS
2.0000 | ORAL_TABLET | Freq: Two times a day (BID) | ORAL | Status: AC
  Administered 2023-10-28 (×2): 2 via ORAL
  Filled 2023-10-28 (×2): qty 2

## 2023-10-28 MED ORDER — SENNOSIDES-DOCUSATE SODIUM 8.6-50 MG PO TABS: 2.0000 | ORAL_TABLET | Freq: Two times a day (BID) | ORAL | Status: DC | PRN

## 2023-10-28 NOTE — Evaluation (Addendum)
 Physical Therapy Evaluation Patient Details Name: Kristina Long MRN: [id number] DOB: May 25, 1956 Today's Date: 10/28/2023  History of Present Illness  68 y.o. female admitted 10/26/23 with progressively worsening low back and R flank pain (seen in ED 4/28 and 4/30, eventually sent home); complicated by AMS, wandering in ED hallway, suspect related to pain meds; leukocytosis, no clear source of infection. Lumbar CT showed Grade 1 L4-5 anterolisthesis. Pt unable to tolerate MRI. PMH includes DM.   Clinical Impression  Pt presents with an overall decrease in functional mobility secondary to above. PTA, pt reports typically indep, active taking classes at [employer], drives, lives with husband who works; pt difficult historian with conflicting info regarding onset of symptoms and difficulty mobilizing at home, but most recently using cane. Today, pt requiring modA to stand and take steps, heavy reliance on external assist and BUE support to maintain balance, standing time and ambulation distance limited by pain. Pt with inconsistent strength and pain presentation, though repeated c/o pain in R-side buttocks and R-side abdomen. Pt limited by pain, poor balance strategies, decreased activity tolerance and impaired cognition, including decreased awareness, attention and difficulty problem solving. Pending progress and pain control, may require post-acute rehab to maximize functional mobility and independence prior to return home.       If plan is discharge home, recommend the following: A lot of help with walking and/or transfers;A lot of help with bathing/dressing/bathroom;Supervision due to cognitive status;Assistance with cooking/housework;Assist for transportation;Help with stairs or ramp for entrance   Can travel by private vehicle    TBD     Equipment Recommendations  (TBD-potential rolling walker)  Recommendations for Other Services   Occupational Therapist   Functional Status Assessment Patient has  had a recent decline in their functional status and demonstrates the ability to make significant improvements in function in a reasonable and predictable amount of time.     Precautions / Restrictions Precautions Precautions: Back;Fall Recall of Precautions/Restrictions: Impaired Precaution/Restrictions Comments: back precautions for comfort, though pt not complying Restrictions Weight Bearing Restrictions Per Provider Order: No      Mobility  Bed Mobility Overal bed mobility: Needs Assistance Bed Mobility: Supine to Sit, Rolling Rolling: Modified independent (Device/Increase time), Used rails   Supine to sit: Supervision, Used rails, HOB elevated     General bed mobility comments: educ on log roll for comfort, pt rolling to L mod indep with bed rail, then opting to roll back and use opposite bed rail to come to long sitting and scoot to EOB, c/o significant R buttocks pain with this; reeduc on back precautions for comfort    Transfers Overall transfer level: Needs assistance Equipment used: None, 1 person hand held assist, Rolling walker (2 wheels) Transfers: Sit to/from Stand, Bed to chair/wheelchair/BSC Sit to Stand: Mod assist   Step pivot transfers: Mod assist       General transfer comment: modA for sit>stand from elevated bed height without DME, reliant on HHA to maintain standing balance; pivotal steps to recliner (~2') with modA and bilateral HHA, uncontrolled descend to sit. additional 2x sit<>stand from recliner to RW, modA for trunk elevation, pt able to transition UE support to walker but unable to extend trunk/hips to fully upright standing due to pain, sitting herself back down    Ambulation/Gait               General Gait Details: pivotal steps to recliner, further ambulation distance limited by c/o pain  Stairs  Wheelchair Mobility     Tilt Bed    Modified Rankin (Stroke Patients Only)       Balance Overall balance  assessment: Needs assistance Sitting-balance support: No upper extremity supported, Feet supported Sitting balance-Leahy Scale: Fair Sitting balance - Comments: educ on figure four technique to don socks; able to don R sock with significant increased time, repeatedly attempting to bend down to feet to don socks despite cues; assist to don L sock   Standing balance support: Single extremity supported, No upper extremity supported, Bilateral upper extremity supported, During functional activity Standing balance-Leahy Scale: Poor Standing balance comment: reliant on UE support and external assist for static standing; unable to perform pericare/washup with single UE support requiring assist                             Pertinent Vitals/Pain Pain Assessment Pain Assessment: Faces Faces Pain Scale: Hurts even more Pain Location: pain varies in location from back, R-side abdomen, R buttocks, R hip Pain Descriptors / Indicators: Discomfort, Grimacing, Guarding, Moaning Pain Intervention(s): Monitored during session, Limited activity within patient's tolerance, RN gave pain meds during session    Home Living Family/patient expects to be discharged to:: Private residence Living Arrangements: Spouse/significant other Available Help at Discharge: Family;Available PRN/intermittently Type of Home: House Home Access: Stairs to enter Entrance Stairs-Rails: None Entrance Stairs-Number of Steps: 1   Home Layout: One level Home Equipment: Cane - single point;Shower seat Additional Comments: husband works    Prior Function Prior Level of Function : Independent/Modified Independent             Mobility Comments: typically indep, active, drives, does classes at [employer] 2-3x/wk. poor historian regarding onset of weakness/pain and difficulty moving at home; states weeks, then states it was never this bad, etc. despite reorientation to recent ED visits due to pain       Extremity/Trunk  Assessment   Upper Extremity Assessment Upper Extremity Assessment: Overall WFL for tasks assessed    Lower Extremity Assessment Lower Extremity Assessment: Generalized weakness (functional observed strength >/ 3/5; inconsistent strength presentation related to pain)       Communication   Communication Communication: No apparent difficulties    Cognition Arousal: Alert Behavior During Therapy: WFL for tasks assessed/performed   PT - Cognitive impairments: No family/caregiver present to determine baseline, Awareness, Memory, Attention, Safety/Judgement, Problem solving                       PT - Cognition Comments: decreased awareness of deficits and situation, slow to respond at times, c/o pain but not following directions for precautions or compensatory strategies for comfort. when asked, pt states her cognition is normal Following commands: Intact       Cueing       General Comments General comments (skin integrity, edema, etc.): educ re: role of acute PT, POC, back precautions for comfort, activity recommendations, importance of OOB mobility, potential d/c needs including SNF vs HHPT; pt does not feel she can manage at home, interested in post-acute rehab    Exercises     Assessment/Plan    PT Assessment Patient needs continued PT services  PT Problem List Decreased strength;Decreased activity tolerance;Decreased balance;Decreased mobility;Decreased cognition;Decreased knowledge of use of DME;Decreased safety awareness;Decreased knowledge of precautions;Pain       PT Treatment Interventions DME instruction;Gait training;Stair training;Functional mobility training;Therapeutic activities;Therapeutic exercise;Balance training;Patient/family education;Cognitive remediation    PT Goals (Current  goals can be found in the Care Plan section)  Acute Rehab PT Goals Patient Stated Goal: "I can't go home like this" PT Goal Formulation: With patient Time For Goal  Achievement: 11/11/23 Potential to Achieve Goals: Good    Frequency Min 3X/week     Co-evaluation               AM-PAC PT "6 Clicks" Mobility  Outcome Measure Help needed turning from your back to your side while in a flat bed without using bedrails?: A Little Help needed moving from lying on your back to sitting on the side of a flat bed without using bedrails?: A Lot Help needed moving to and from a bed to a chair (including a wheelchair)?: A Lot Help needed standing up from a chair using your arms (e.g., wheelchair or bedside chair)?: A Lot Help needed to walk in hospital room?: A Lot Help needed climbing 3-5 steps with a railing? : Total 6 Click Score: 12    End of Session   Activity Tolerance: Patient limited by pain Patient left: in chair;with call bell/phone within reach;with chair alarm set;with family/visitor present Nurse Communication: Mobility status PT Visit Diagnosis: Other abnormalities of gait and mobility (R26.89);Muscle weakness (generalized) (M62.81);Pain    Time: 1610-9604 PT Time Calculation (min) (ACUTE ONLY): 29 min   Charges:   PT Evaluation $PT Eval Moderate Complexity: 1 Mod PT Treatments $Therapeutic Activity: 8-22 mins PT General Charges $$ ACUTE PT VISIT: 1 Visit       Blase Bur, PT, DPT Acute Rehabilitation Services  Personal: Secure Chat Rehab Office: [phone]  Albino Hum 10/28/2023, 3:09 PM

## 2023-10-28 NOTE — Progress Notes (Signed)
 PROGRESS NOTE  Kristina Long ZOX:[id number] DOB: 06-24-1956   PCP: Arlys Berke, MD  Patient is from: Home.  Lives with husband.  Independently ambulates at baseline.  DOA: 10/26/2023 LOS: 1  Chief complaints Chief Complaint  Patient presents with   Drug Overdose     Brief Narrative / Interim history: 68 year old F with PMH of DM-2 returning to ED for the third time in a week with right back and flank pain.  [name]  She said she had extensive evaluation by different experts outpatient at that time without significant finding.  Eventually pain resolved on its own.  [name], and started feeling similar pain.  [name]  At that time, WBC 19.7 with left shift.  CMP, lipase, UA and CT abdomen and pelvis without significant finding.  Symptoms improved with pain medication and [name]   Patient returned to ED on 4/30 with the same complaints.  This time, she was febrile to 100.5.  WBC 18.4 with left shift.  CT renal stone study with grade 1 L4-L5 anterolisthesis.  Blood cultures obtained.  [name]  Patient returns to ED on 5/2 with right low back pain radiating to right groin and altered mental status.  She accidentally took 8 of the 200 mg ibuprofen, 2 oxycodone  and her muscle relaxers due to pain.    In ED, slightly tachycardic, tachypneic and hypertensive.  WBC 16.5 with left shift.  CMP with hyperglycemia.  UA with moderate Hgb, proteinuria and glucosuria.  Reportedly, she was in severe pain while in ED. MRI was ordered but delayed due to altered mental status.  She was admitted and transferred to [facility] for MRI under anesthesia.     Subjective: Seen and examined earlier this morning.  No  major events overnight of this morning.  Reports improvement in hip pain.  Still with significant pain and possible especially with movement.  Pain radiates to her legs.  Denies numbness, tingling or focal weakness.  Denies dysuria, frequency, urgency or urinary retention.  Has not a bowel movement in 3 to 4 days.  Denies bowel incontinence or diarrhea.  Denies pain in her neck or symptoms in her arms.  Objective: Vitals:   10/27/23 2016 10/28/23 0005 10/28/23 0443 10/28/23 0843  BP: (!) 176/73 (!) 165/78 (!) 168/85 (!) 165/96  Pulse: 94 92  85  Resp: 17   18  Temp: 98.7 F (37.1 C) 98.7 F (37.1 C) 98.2 F (36.8 C) 98 F (36.7 C)  TempSrc: Oral Oral Oral Oral  SpO2: 90% 94% 97% 99%  Weight:      Height:        Examination:  GENERAL: No apparent distress.  Nontoxic. HEENT: MMM.  Vision and hearing grossly intact.  NECK: Supple.  No apparent JVD.  RESP:  No IWOB.  Fair aeration bilaterally. CVS:  RRR. Heart sounds normal.  ABD/GI/GU: BS+. Abd soft, NTND.  MSK/EXT:  Moves extremities symmetrically. No apparent deformity. No edema.  Tenderness over thoracic and lumbar spines on exam SKIN: no apparent skin lesion or wound NEURO: Awake, alert and oriented appropriately.  No apparent focal neuro deficit. PSYCH: Calm. Normal affect.   Consultants:  None  Procedures: None  Microbiology summarized: 4/30-blood cultures NGTD  Assessment and plan: Severe lower back pain with radiation to right groin: Looks musculoskeletal but she has significant leukocytosis.  She had mild fever on 4/30 when she was seen in ED.  Blood culture was negative at that time.  Similar symptoms about a year ago after heavy lifting.  It seems she did some heavy lifting at church about 10 days ago that has precipitated her symptoms.  No significant finding on CT abdomen and pelvis and CT renal stone study on 4/28 and 4/30 respectively.  However, she has significant tenderness over thoracic and lumbar spines on  exam.  Her UA does not suggest UTI.  Her blood cultures negative on 4/30.  -Will obtain MRI thoracic spine and lumbar spine -Check CRP and ESR -May consider ID consult if leukocytosis persist -Pain control-scheduled Tylenol , Robaxin , Toradol  with as needed Dilaudid . -Aggressive bowel regimen -PT/OT  Acute encephalopathy: Iatrogenic from multiple sedating medications.  Patient was prescribed Robaxin , Skelaxin  and Percocet when she was seen in ED on 4/28 and 4/30.  She has no focal neurodeficit.  She has leukocytosis without clear source of infection.  She is currently awake alert and oriented sitting on bedside chair. -Minimize sedating medications -Reorientation and delirium precaution  Uncontrolled NIDDM-2 with hyperglycemia: A1c 8.7%.  On metformin  and Jardiance  at home Recent Labs  Lab 10/27/23 1319 10/27/23 1642 10/27/23 2124 10/28/23 0706 10/28/23 1204  GLUCAP 318* 350* 357* 382* 396*  -Continue [name]-moderate -Add Semglee 15 units daily -Added NovoLog 5 units 3 times daily with meals -Further adjustment as appropriate  Proteinuria -Continue home lisinopril  Leukocytosis/bandemia: No clear source of infection but presents with severe back pain.  Recent blood culture NGTD.  UA negative for UTI.  She has no UTI symptoms either.  CT abdomen and pelvis and CT renal stone study unrevealing.  No respiratory symptoms. -MRI thoracic spine and lumbar spine to exclude infection  Body mass index is 27.55 kg/m.           DVT prophylaxis:  heparin injection 5,000 Units Start: 10/27/23 2200  Code Status: Full code Family Communication: Updated patient's husband at bedside Level of care: Telemetry Medical Status is: Inpatient Remains inpatient appropriate because: Due to severe back pain and leukocytosis   Final disposition: To be determined   55 minutes with more than 50% spent in reviewing records, counseling patient/family and coordinating care.   Sch Meds:  Scheduled  Meds:  acetaminophen   1,000 mg Oral Q6H WA   heparin  5,000 Units Subcutaneous Q8H   insulin aspart  0-15 Units Subcutaneous TID WC   insulin aspart  0-5 Units Subcutaneous QHS   ketorolac   15 mg Intravenous Q6H   lidocaine  1 patch Transdermal Q2000   methocarbamol  (ROBAXIN ) injection  500 mg Intravenous Q6H   Continuous Infusions: PRN Meds:.HYDROmorphone  (DILAUDID ) injection, loratadine, ondansetron  **OR** ondansetron  (ZOFRAN ) IV, polyethylene glycol  Antimicrobials: Anti-infectives (From admission, onward)    None        I have personally reviewed the following labs and images: CBC: Recent Labs  Lab 10/22/23 0735 10/24/23 1517 10/26/23 2025 10/28/23 0506  WBC 19.7* 18.4* 16.5* 21.3*  NEUTROABS 17.3* 15.8* 13.3*  --   HGB 11.5* 11.7* 11.1* 11.9*  HCT 35.2* 37.7 34.4* 37.4  MCV 84.0 87.3 84.9 84.4  PLT 252 305 289 328   BMP &GFR Recent Labs  Lab 10/22/23 0735 10/24/23 1517 10/26/23 2025 10/28/23 0506  NA 138 134* 135 136  K 4.1 3.4*  4.1 3.7  CL 100 98 99 98  CO2 21* 22 23 22   GLUCOSE 272* 282* 310* 357*  BUN 13 18 17 21   CREATININE 0.79 0.52 0.59 0.87  CALCIUM  10.0 9.2 9.1 9.5   Estimated Creatinine Clearance: 73.1 mL/min (by C-G formula based on SCr of 0.87 mg/dL). Liver & Pancreas: Recent Labs  Lab 10/22/23 0735 10/24/23 1517 10/26/23 2025  AST 25 25 27   ALT 28 30 29   ALKPHOS 100 93 89  BILITOT 0.6 0.8 1.3*  PROT 8.4* 8.6* 8.3*  ALBUMIN 4.1 3.4* 3.0*   Recent Labs  Lab 10/22/23 0735  LIPASE 19   No results for input(s): "AMMONIA" in the last 168 hours. Diabetic: Recent Labs    10/26/23 2025  HGBA1C 8.7*   Recent Labs  Lab 10/27/23 1319 10/27/23 1642 10/27/23 2124 10/28/23 0706 10/28/23 1204  GLUCAP 318* 350* 357* 382* 396*   Cardiac Enzymes: No results for input(s): "CKTOTAL", "CKMB", "CKMBINDEX", "TROPONINI" in the last 168 hours. No results for input(s): "PROBNP" in the last 8760 hours. Coagulation Profile: Recent Labs   Lab 10/24/23 1517  INR 1.2   Thyroid  Function Tests: No results for input(s): "TSH", "T4TOTAL", "FREET4", "T3FREE", "THYROIDAB" in the last 72 hours. Lipid Profile: No results for input(s): "CHOL", "HDL", "LDLCALC", "TRIG", "CHOLHDL", "LDLDIRECT" in the last 72 hours. Anemia Panel: No results for input(s): "VITAMINB12", "FOLATE", "FERRITIN", "TIBC", "IRON", "RETICCTPCT" in the last 72 hours. Urine analysis:    Component Value Date/Time   COLORURINE YELLOW 10/26/2023 2027   APPEARANCEUR CLEAR 10/26/2023 2027   LABSPEC 1.031 (H) 10/26/2023 2027   PHURINE 5.0 10/26/2023 2027   GLUCOSEU >=500 (A) 10/26/2023 2027   HGBUR MODERATE (A) 10/26/2023 2027   BILIRUBINUR NEGATIVE 10/26/2023 2027   KETONESUR 20 (A) 10/26/2023 2027   PROTEINUR >=300 (A) 10/26/2023 2027   NITRITE NEGATIVE 10/26/2023 2027   LEUKOCYTESUR NEGATIVE 10/26/2023 2027   Sepsis Labs: Invalid input(s): "PROCALCITONIN", "LACTICIDVEN"  Microbiology: Recent Results (from the past 240 hours)  Blood Culture (routine x 2)     Status: None (Preliminary result)   Collection Time: 10/24/23  3:12 PM   Specimen: BLOOD LEFT ARM  Result Value Ref Range Status   Specimen Description   Final    BLOOD LEFT ARM Performed at [facility], [address], [place], [place] [address]    Special Requests   Final    BOTTLES DRAWN AEROBIC AND ANAEROBIC Blood Culture results may not be optimal due to an inadequate volume of blood received in culture bottles Performed at [facility], [address], [place], [place] [address]    Culture   Final    NO GROWTH 4 DAYS Performed at [facility], [address], [place], [place] [address]    Report Status PENDING  Incomplete  Blood Culture (routine x 2)     Status: None (Preliminary result)   Collection Time: 10/24/23  3:17 PM   Specimen: BLOOD RIGHT ARM  Result Value Ref Range Status   Specimen Description   Final    BLOOD RIGHT ARM Performed at [facility], [address] [place], [place] [address]    Special Requests   Final    BOTTLES DRAWN AEROBIC AND ANAEROBIC Blood Culture results may not be optimal due to an inadequate volume of blood received in culture bottles Performed at [facility], [address] [place], [place] [address]    Culture   Final  NO GROWTH 4 DAYS Performed at [facility], [address], [place], [place] [address]    Report Status PENDING  Incomplete  Resp panel by RT-PCR (RSV, Flu A&B, Covid) Anterior Nasal Swab     Status: None   Collection Time: 10/24/23  3:57 PM   Specimen: Anterior Nasal Swab  Result Value Ref Range Status   SARS Coronavirus 2 by RT PCR NEGATIVE NEGATIVE Final    Comment: (NOTE) SARS-CoV-2 target nucleic acids are NOT DETECTED.  The SARS-CoV-2 RNA is generally detectable in upper respiratory specimens during the acute phase of infection. The lowest concentration of SARS-CoV-2 viral copies this assay can detect is 138 copies/mL. A negative result does not preclude SARS-Cov-2 infection and should not be used as the sole basis for treatment or other patient management decisions. A negative result may occur with  improper specimen collection/handling, submission of specimen other than nasopharyngeal swab, presence of viral mutation(s) within the areas targeted by this assay, and inadequate number of viral copies(<138 copies/mL). A negative result must be combined with clinical observations, patient history, and epidemiological information. The expected result is Negative.  Fact Sheet for Patients:  BloggerCourse.com  Fact Sheet for Healthcare Providers:  SeriousBroker.it  This test is no t yet approved or cleared by the [place]  FDA and  has been authorized for detection and/or diagnosis of SARS-CoV-2 by FDA under an Emergency Use Authorization (EUA). This EUA will remain  in effect  (meaning this test can be used) for the duration of the COVID-19 declaration under Section 564(b)(1) of the Act, 21 U.S.C.section 360bbb-3(b)(1), unless the authorization is terminated  or revoked sooner.       Influenza A by PCR NEGATIVE NEGATIVE Final   Influenza B by PCR NEGATIVE NEGATIVE Final    Comment: (NOTE) The Xpert Xpress SARS-CoV-2/FLU/RSV plus assay is intended as an aid in the diagnosis of influenza from Nasopharyngeal swab specimens and should not be used as a sole basis for treatment. Nasal washings and aspirates are unacceptable for Xpert Xpress SARS-CoV-2/FLU/RSV testing.  Fact Sheet for Patients: BloggerCourse.com  Fact Sheet for Healthcare Providers: SeriousBroker.it  This test is not yet approved or cleared by the [place]  FDA and has been authorized for detection and/or diagnosis of SARS-CoV-2 by FDA under an Emergency Use Authorization (EUA). This EUA will remain in effect (meaning this test can be used) for the duration of the COVID-19 declaration under Section 564(b)(1) of the Act, 21 U.S.C. section 360bbb-3(b)(1), unless the authorization is terminated or revoked.     Resp Syncytial Virus by PCR NEGATIVE NEGATIVE Final    Comment: (NOTE) Fact Sheet for Patients: BloggerCourse.com  Fact Sheet for Healthcare Providers: SeriousBroker.it  This test is not yet approved or cleared by the [place]  FDA and has been authorized for detection and/or diagnosis of SARS-CoV-2 by FDA under an Emergency Use Authorization (EUA). This EUA will remain in effect (meaning this test can be used) for the duration of the COVID-19 declaration under Section 564(b)(1) of the Act, 21 U.S.C. section 360bbb-3(b)(1), unless the authorization is terminated or revoked.  Performed at [facility], [address], [place], [place] [address]      Radiology Studies: DG Lumbar Spine 1 View Result Date: 10/27/2023 CLINICAL DATA:  1 week of low back pain.  No fall. EXAM: LUMBAR SPINE - 1 VIEW; PELVIS - 1-2 VIEW COMPARISON:  10/24/2023 CT FINDINGS: Pelvis: Single AP view of the pelvis demonstrates no fracture or dislocation. Hip joint  spaces are maintained for age. Sacroiliac joints are symmetric. Left central pelvic calcification is secondary to a dystrophic fibroid when correlated with prior CT. Lumbar spine: Single AP view of the lumbar spine demonstrates grossly maintained vertebral body height. Spondylosis at L4-5 and L5-S1. IMPRESSION: No acute osseous abnormality. Limited evaluation lumbar spine secondary to lack of lateral view. Electronically Signed   By: Lore Rode M.D.   On: 10/27/2023 16:21   DG Pelvis 1-2 Views Result Date: 10/27/2023 CLINICAL DATA:  1 week of low back pain.  No fall. EXAM: LUMBAR SPINE - 1 VIEW; PELVIS - 1-2 VIEW COMPARISON:  10/24/2023 CT FINDINGS: Pelvis: Single AP view of the pelvis demonstrates no fracture or dislocation. Hip joint spaces are maintained for age. Sacroiliac joints are symmetric. Left central pelvic calcification is secondary to a dystrophic fibroid when correlated with prior CT. Lumbar spine: Single AP view of the lumbar spine demonstrates grossly maintained vertebral body height. Spondylosis at L4-5 and L5-S1. IMPRESSION: No acute osseous abnormality. Limited evaluation lumbar spine secondary to lack of lateral view. Electronically Signed   By: Lore Rode M.D.   On: 10/27/2023 16:21      Saya Mccoll T. Ladena Jacquez [facility]  If 7PM-7AM, please contact night-coverage [email or web address] 10/28/2023, 12:46 PM

## 2023-10-28 NOTE — Progress Notes (Signed)
 Inpatient Rehab Admissions Coordinator Note:   Per PT patient was screened for CIR candidacy by Marciana Uplinger Annell Barrow, CCC-SLP. At this time, pt appears to be a potential candidate for CIR. I will place an order for rehab consult for full assessment, per our protocol.  Please contact me any with questions.   Artemus Larsen, MS, CCC-SLP Admissions Coordinator [phone] 10/28/23 5:11 PM

## 2023-10-29 ENCOUNTER — Encounter (HOSPITAL_COMMUNITY)
Admission: EM | Disposition: A | Discharge status: Rehabilitation Facility | Payer: Self-pay | Source: Home / Self Care | Attending: Student

## 2023-10-29 ENCOUNTER — Inpatient Hospital Stay (HOSPITAL_COMMUNITY)

## 2023-10-29 ENCOUNTER — Other Ambulatory Visit: Payer: Self-pay

## 2023-10-29 ENCOUNTER — Encounter (HOSPITAL_COMMUNITY): Payer: Self-pay | Admitting: Internal Medicine

## 2023-10-29 DIAGNOSIS — M5441 Lumbago with sciatica, right side: Secondary | ICD-10-CM | POA: Diagnosis not present

## 2023-10-29 DIAGNOSIS — E119 Type 2 diabetes mellitus without complications: Secondary | ICD-10-CM

## 2023-10-29 DIAGNOSIS — G062 Extradural and subdural abscess, unspecified: Secondary | ICD-10-CM | POA: Diagnosis not present

## 2023-10-29 DIAGNOSIS — Z9889 Other specified postprocedural states: Secondary | ICD-10-CM

## 2023-10-29 DIAGNOSIS — M5442 Lumbago with sciatica, left side: Secondary | ICD-10-CM | POA: Diagnosis not present

## 2023-10-29 HISTORY — PX: LUMBAR LAMINECTOMY FOR EPIDURAL ABSCESS: SHX5956

## 2023-10-29 LAB — CULTURE, BLOOD (ROUTINE X 2)
Culture: NO GROWTH
Culture: NO GROWTH

## 2023-10-29 LAB — RENAL FUNCTION PANEL
Albumin: 2.4 g/dL — ABNORMAL LOW (ref 3.5–5.0)
Anion gap: 12 (ref 5–15)
BUN: 31 mg/dL — ABNORMAL HIGH (ref 8–23)
CO2: 25 mmol/L (ref 22–32)
Calcium: 9.4 mg/dL (ref 8.9–10.3)
Chloride: 97 mmol/L — ABNORMAL LOW (ref 98–111)
Creatinine, Ser: 0.75 mg/dL (ref 0.44–1.00)
GFR, Estimated: >60 mL/min (ref >60)
Glucose, Bld: 320 mg/dL — ABNORMAL HIGH (ref 70–99)
Phosphorus: 3.4 mg/dL (ref 2.5–4.6)
Potassium: 3.6 mmol/L (ref 3.5–5.1)
Sodium: 134 mmol/L — ABNORMAL LOW (ref 135–145)

## 2023-10-29 LAB — GLUCOSE, CAPILLARY
Glucose-Capillary: 319 mg/dL — ABNORMAL HIGH (ref 70–99)
Glucose-Capillary: 298 mg/dL — ABNORMAL HIGH (ref 70–99)
Glucose-Capillary: 330 mg/dL — ABNORMAL HIGH (ref 70–99)
Glucose-Capillary: 254 mg/dL — ABNORMAL HIGH (ref 70–99)
Glucose-Capillary: 271 mg/dL — ABNORMAL HIGH (ref 70–99)
Glucose-Capillary: 213 mg/dL — ABNORMAL HIGH (ref 70–99)
Glucose-Capillary: 284 mg/dL — ABNORMAL HIGH (ref 70–99)

## 2023-10-29 LAB — CBC
HCT: 35 % — ABNORMAL LOW (ref 36.0–46.0)
Hemoglobin: 11.6 g/dL — ABNORMAL LOW (ref 12.0–15.0)
MCH: 27.5 pg (ref 26.0–34.0)
MCHC: 33.1 g/dL (ref 30.0–36.0)
MCV: 82.9 fL (ref 80.0–100.0)
Platelets: 342 10*3/uL (ref 150–400)
RBC: 4.22 MIL/uL (ref 3.87–5.11)
RDW: 14.1 % (ref 11.5–15.5)
WBC: 18.5 10*3/uL — ABNORMAL HIGH (ref 4.0–10.5)
nRBC: 0 % (ref 0.0–0.2)

## 2023-10-29 LAB — SURGICAL PCR SCREEN
MRSA, PCR: NEGATIVE
Staphylococcus aureus: NEGATIVE

## 2023-10-29 LAB — MAGNESIUM: Magnesium: 2.1 mg/dL (ref 1.7–2.4)

## 2023-10-29 SURGERY — LUMBAR LAMINECTOMY FOR EPIDURAL ABSCESS
Anesthesia: General | Site: Back

## 2023-10-29 MED ORDER — METHOCARBAMOL 500 MG PO TABS
500.0000 mg | ORAL_TABLET | Freq: Four times a day (QID) | ORAL | Status: DC | PRN
  Administered 2023-10-30 – 2023-11-05 (×9): 500 mg via ORAL
  Filled 2023-10-29 (×9): qty 1

## 2023-10-29 MED ORDER — ONDANSETRON HCL 4 MG/2ML IJ SOLN
4.0000 mg | Freq: Four times a day (QID) | INTRAMUSCULAR | Status: DC | PRN
  Administered 2023-11-05: 4 mg via INTRAVENOUS

## 2023-10-29 MED ORDER — SODIUM CHLORIDE 0.9% FLUSH: 3.0000 mL | INTRAVENOUS | Status: DC | PRN

## 2023-10-29 MED ORDER — PROPOFOL 10 MG/ML IV BOLUS
INTRAVENOUS | Status: DC | PRN
  Administered 2023-10-29: 160 mg via INTRAVENOUS

## 2023-10-29 MED ORDER — LABETALOL HCL 5 MG/ML IV SOLN: 10.0000 mg | INTRAVENOUS | Status: DC | PRN

## 2023-10-29 MED ORDER — SODIUM CHLORIDE 0.9% FLUSH
3.0000 mL | Freq: Two times a day (BID) | INTRAVENOUS | Status: DC
  Administered 2023-10-29 – 2023-11-06 (×15): 3 mL via INTRAVENOUS

## 2023-10-29 MED ORDER — ONDANSETRON HCL 4 MG/2ML IJ SOLN
INTRAMUSCULAR | Status: AC
  Filled 2023-10-29: qty 2

## 2023-10-29 MED ORDER — VANCOMYCIN HCL IN DEXTROSE 1-5 GM/200ML-% IV SOLN
INTRAVENOUS | Status: AC
Start: 2023-10-29 — End: 2023-10-29
  Filled 2023-10-29: qty 200

## 2023-10-29 MED ORDER — PROPOFOL 10 MG/ML IV BOLUS
INTRAVENOUS | Status: AC
  Filled 2023-10-29: qty 20

## 2023-10-29 MED ORDER — ROCURONIUM BROMIDE 10 MG/ML (PF) SYRINGE
PREFILLED_SYRINGE | INTRAVENOUS | Status: DC | PRN
  Administered 2023-10-29: 50 mg via INTRAVENOUS

## 2023-10-29 MED ORDER — SODIUM CHLORIDE 0.9 % IV SOLN: 2.0000 g | Freq: Two times a day (BID) | INTRAVENOUS | Status: DC

## 2023-10-29 MED ORDER — INSULIN ASPART 100 UNIT/ML IJ SOLN
8.0000 [IU] | Freq: Once | INTRAMUSCULAR | Status: AC
  Administered 2023-10-29: 8 [IU] via SUBCUTANEOUS

## 2023-10-29 MED ORDER — ACETAMINOPHEN 10 MG/ML IV SOLN
INTRAVENOUS | Status: AC
  Filled 2023-10-29: qty 100

## 2023-10-29 MED ORDER — METHOCARBAMOL 1000 MG/10ML IJ SOLN: 500.0000 mg | Freq: Four times a day (QID) | INTRAMUSCULAR | Status: DC | PRN

## 2023-10-29 MED ORDER — PHENYLEPHRINE 80 MCG/ML (10ML) SYRINGE FOR IV PUSH (FOR BLOOD PRESSURE SUPPORT)
PREFILLED_SYRINGE | INTRAVENOUS | Status: DC | PRN
Start: 2023-10-29 — End: 2023-10-29
  Administered 2023-10-29: 160 ug via INTRAVENOUS

## 2023-10-29 MED ORDER — ORAL CARE MOUTH RINSE: 15.0000 mL | Freq: Once | OROMUCOSAL | Status: AC

## 2023-10-29 MED ORDER — FENTANYL CITRATE (PF) 100 MCG/2ML IJ SOLN: 25.0000 ug | INTRAMUSCULAR | Status: DC | PRN

## 2023-10-29 MED ORDER — CELECOXIB 200 MG PO CAPS
200.0000 mg | ORAL_CAPSULE | Freq: Two times a day (BID) | ORAL | Status: DC
  Administered 2023-10-29 – 2023-11-06 (×17): 200 mg via ORAL
  Filled 2023-10-29 (×18): qty 1

## 2023-10-29 MED ORDER — CHLORHEXIDINE GLUCONATE 0.12 % MT SOLN: 15.0000 mL | Freq: Once | OROMUCOSAL | Status: AC

## 2023-10-29 MED ORDER — INSULIN ASPART 100 UNIT/ML IJ SOLN
7.0000 [IU] | Freq: Three times a day (TID) | INTRAMUSCULAR | Status: DC
  Administered 2023-10-30 – 2023-11-07 (×21): 7 [IU] via SUBCUTANEOUS

## 2023-10-29 MED ORDER — METRONIDAZOLE 500 MG/100ML IV SOLN
500.0000 mg | Freq: Two times a day (BID) | INTRAVENOUS | Status: DC
  Administered 2023-10-29: 500 mg via INTRAVENOUS
  Filled 2023-10-29: qty 100

## 2023-10-29 MED ORDER — VANCOMYCIN HCL IN DEXTROSE 1-5 GM/200ML-% IV SOLN
1000.0000 mg | Freq: Once | INTRAVENOUS | Status: AC
  Administered 2023-10-29: 1000 mg via INTRAVENOUS

## 2023-10-29 MED ORDER — OXYCODONE HCL 5 MG/5ML PO SOLN: 5.0000 mg | Freq: Once | ORAL | Status: DC | PRN

## 2023-10-29 MED ORDER — VANCOMYCIN HCL 1000 MG IV SOLR
INTRAVENOUS | Status: DC | PRN
Start: 2023-10-29 — End: 2023-10-29
  Administered 2023-10-29: 1000 mg via TOPICAL

## 2023-10-29 MED ORDER — INSULIN ASPART 100 UNIT/ML IJ SOLN: 0.0000 [IU] | INTRAMUSCULAR | Status: DC | PRN

## 2023-10-29 MED ORDER — SODIUM CHLORIDE 0.9 % IV SOLN
2.0000 g | Freq: Two times a day (BID) | INTRAVENOUS | Status: DC
  Administered 2023-10-29: 2 g via INTRAVENOUS
  Filled 2023-10-29: qty 12.5

## 2023-10-29 MED ORDER — BUPIVACAINE HCL (PF) 0.25 % IJ SOLN
INTRAMUSCULAR | Status: AC
  Filled 2023-10-29: qty 30

## 2023-10-29 MED ORDER — PHENYLEPHRINE HCL-NACL 20-0.9 MG/250ML-% IV SOLN
INTRAVENOUS | Status: DC | PRN
  Administered 2023-10-29: 30 ug/min via INTRAVENOUS

## 2023-10-29 MED ORDER — POTASSIUM CHLORIDE IN NACL 20-0.9 MEQ/L-% IV SOLN
INTRAVENOUS | Status: AC
  Filled 2023-10-29: qty 1000

## 2023-10-29 MED ORDER — CEFTRIAXONE SODIUM 2 G IJ SOLR
INTRAMUSCULAR | Status: AC
  Filled 2023-10-29: qty 20

## 2023-10-29 MED ORDER — FENTANYL CITRATE (PF) 250 MCG/5ML IJ SOLN
INTRAMUSCULAR | Status: DC | PRN
  Administered 2023-10-29 (×4): 50 ug via INTRAVENOUS

## 2023-10-29 MED ORDER — ONDANSETRON HCL 4 MG/2ML IJ SOLN
INTRAMUSCULAR | Status: DC | PRN
  Administered 2023-10-29: 4 mg via INTRAVENOUS

## 2023-10-29 MED ORDER — FENTANYL CITRATE (PF) 250 MCG/5ML IJ SOLN
INTRAMUSCULAR | Status: AC
  Filled 2023-10-29: qty 5

## 2023-10-29 MED ORDER — ONDANSETRON HCL 4 MG PO TABS: 4.0000 mg | ORAL_TABLET | Freq: Four times a day (QID) | ORAL | Status: DC | PRN

## 2023-10-29 MED ORDER — SENNA 8.6 MG PO TABS
1.0000 | ORAL_TABLET | Freq: Two times a day (BID) | ORAL | Status: DC
  Administered 2023-10-29 – 2023-10-30 (×2): 8.6 mg via ORAL
  Filled 2023-10-29 (×2): qty 1

## 2023-10-29 MED ORDER — MIDAZOLAM HCL 2 MG/2ML IJ SOLN
INTRAMUSCULAR | Status: AC
  Filled 2023-10-29: qty 2

## 2023-10-29 MED ORDER — CHLORHEXIDINE GLUCONATE 0.12 % MT SOLN
OROMUCOSAL | Status: AC
  Administered 2023-10-29: 15 mL via OROMUCOSAL
  Filled 2023-10-29: qty 15

## 2023-10-29 MED ORDER — BISACODYL 10 MG RE SUPP
10.0000 mg | Freq: Every day | RECTAL | Status: DC
  Administered 2023-10-29 – 2023-10-31 (×3): 10 mg via RECTAL
  Filled 2023-10-29 (×5): qty 1

## 2023-10-29 MED ORDER — THROMBIN 5000 UNITS EX SOLR: OROMUCOSAL | Status: DC | PRN

## 2023-10-29 MED ORDER — HALOPERIDOL LACTATE 5 MG/ML IJ SOLN: 2.0000 mg | Freq: Four times a day (QID) | INTRAMUSCULAR | Status: DC | PRN

## 2023-10-29 MED ORDER — INSULIN ASPART 100 UNIT/ML IJ SOLN
INTRAMUSCULAR | Status: AC
  Administered 2023-10-29: 10 [IU] via SUBCUTANEOUS
  Filled 2023-10-29: qty 1

## 2023-10-29 MED ORDER — THROMBIN 5000 UNITS EX KIT
PACK | CUTANEOUS | Status: AC
  Filled 2023-10-29: qty 1

## 2023-10-29 MED ORDER — OXYCODONE HCL 5 MG PO TABS: 5.0000 mg | ORAL_TABLET | Freq: Once | ORAL | Status: DC | PRN

## 2023-10-29 MED ORDER — LIDOCAINE 2% (20 MG/ML) 5 ML SYRINGE
INTRAMUSCULAR | Status: AC
  Filled 2023-10-29: qty 5

## 2023-10-29 MED ORDER — VASHE WOUND IRRIGATION OPTIME
TOPICAL | Status: DC | PRN
  Administered 2023-10-29: 34 [oz_av]

## 2023-10-29 MED ORDER — VANCOMYCIN HCL 1000 MG IV SOLR
INTRAVENOUS | Status: AC
  Filled 2023-10-29: qty 20

## 2023-10-29 MED ORDER — SUGAMMADEX SODIUM 200 MG/2ML IV SOLN
INTRAVENOUS | Status: DC | PRN
  Administered 2023-10-29: 200 mg via INTRAVENOUS

## 2023-10-29 MED ORDER — HYDROMORPHONE HCL 1 MG/ML IJ SOLN
0.5000 mg | INTRAMUSCULAR | Status: DC | PRN
  Administered 2023-10-31: 0.5 mg via INTRAVENOUS
  Filled 2023-10-29: qty 0.5

## 2023-10-29 MED ORDER — DEXAMETHASONE SODIUM PHOSPHATE 10 MG/ML IJ SOLN
INTRAMUSCULAR | Status: DC | PRN
  Administered 2023-10-29: 5 mg via INTRAVENOUS

## 2023-10-29 MED ORDER — SODIUM CHLORIDE 0.9 % IV SOLN
250.0000 mL | INTRAVENOUS | Status: AC
Start: 2023-10-29 — End: 2023-10-30

## 2023-10-29 MED ORDER — PHENYLEPHRINE 80 MCG/ML (10ML) SYRINGE FOR IV PUSH (FOR BLOOD PRESSURE SUPPORT)
PREFILLED_SYRINGE | INTRAVENOUS | Status: AC
  Filled 2023-10-29: qty 10

## 2023-10-29 MED ORDER — DEXAMETHASONE SODIUM PHOSPHATE 10 MG/ML IJ SOLN
INTRAMUSCULAR | Status: AC
  Filled 2023-10-29: qty 1

## 2023-10-29 MED ORDER — MENTHOL 3 MG MT LOZG: 1.0000 | LOZENGE | OROMUCOSAL | Status: DC | PRN

## 2023-10-29 MED ORDER — MIDAZOLAM HCL 2 MG/2ML IJ SOLN
INTRAMUSCULAR | Status: DC | PRN
  Administered 2023-10-29: 2 mg via INTRAVENOUS

## 2023-10-29 MED ORDER — SODIUM CHLORIDE 0.9 % IV SOLN
1.0000 g | Freq: Two times a day (BID) | INTRAVENOUS | Status: DC
  Administered 2023-10-29: 1 g via INTRAVENOUS
  Filled 2023-10-29: qty 10

## 2023-10-29 MED ORDER — ACETAMINOPHEN 500 MG PO TABS
1000.0000 mg | ORAL_TABLET | Freq: Four times a day (QID) | ORAL | Status: AC
Start: 2023-10-29 — End: 2023-10-30
  Administered 2023-10-30 (×2): 1000 mg via ORAL
  Filled 2023-10-29 (×2): qty 2

## 2023-10-29 MED ORDER — 0.9 % SODIUM CHLORIDE (POUR BTL) OPTIME
TOPICAL | Status: DC | PRN
  Administered 2023-10-29: 1000 mL

## 2023-10-29 MED ORDER — LIDOCAINE 2% (20 MG/ML) 5 ML SYRINGE
INTRAMUSCULAR | Status: DC | PRN
  Administered 2023-10-29: 60 mg via INTRAVENOUS

## 2023-10-29 MED ORDER — ACETAMINOPHEN 10 MG/ML IV SOLN
INTRAVENOUS | Status: DC | PRN
  Administered 2023-10-29: 1000 mg via INTRAVENOUS

## 2023-10-29 MED ORDER — VANCOMYCIN HCL 500 MG/100ML IV SOLN
500.0000 mg | INTRAVENOUS | Status: DC
  Administered 2023-10-30: 500 mg via INTRAVENOUS
  Filled 2023-10-29: qty 100

## 2023-10-29 MED ORDER — SODIUM CHLORIDE 0.9 % IV SOLN
2.0000 g | Freq: Once | INTRAVENOUS | Status: AC
  Administered 2023-10-29: 2 g via INTRAVENOUS

## 2023-10-29 MED ORDER — INSULIN GLARGINE-YFGN 100 UNIT/ML ~~LOC~~ SOLN
25.0000 [IU] | Freq: Every day | SUBCUTANEOUS | Status: DC
  Administered 2023-10-29 – 2023-10-30 (×2): 25 [IU] via SUBCUTANEOUS
  Filled 2023-10-29 (×3): qty 0.25

## 2023-10-29 MED ORDER — SUCCINYLCHOLINE CHLORIDE 200 MG/10ML IV SOSY
PREFILLED_SYRINGE | INTRAVENOUS | Status: DC | PRN
  Administered 2023-10-29: 160 mg via INTRAVENOUS

## 2023-10-29 MED ORDER — LACTATED RINGERS IV SOLN: INTRAVENOUS | Status: DC

## 2023-10-29 MED ORDER — FENTANYL CITRATE (PF) 250 MCG/5ML IJ SOLN
INTRAMUSCULAR | Status: AC
Start: 2023-10-29 — End: ?
  Filled 2023-10-29: qty 5

## 2023-10-29 MED ORDER — ROCURONIUM BROMIDE 10 MG/ML (PF) SYRINGE
PREFILLED_SYRINGE | INTRAVENOUS | Status: AC
  Filled 2023-10-29: qty 10

## 2023-10-29 MED ORDER — OXYCODONE HCL 5 MG PO TABS
5.0000 mg | ORAL_TABLET | ORAL | Status: DC | PRN
  Administered 2023-10-30 – 2023-11-01 (×7): 5 mg via ORAL
  Filled 2023-10-29 (×7): qty 1

## 2023-10-29 MED ORDER — PHENOL 1.4 % MT LIQD: 1.0000 | OROMUCOSAL | Status: DC | PRN

## 2023-10-29 MED ORDER — ACETAMINOPHEN 10 MG/ML IV SOLN: 1000.0000 mg | Freq: Once | INTRAVENOUS | Status: DC | PRN

## 2023-10-29 SURGICAL SUPPLY — 46 items
BAG COUNTER SPONGE SURGICOUNT (BAG) ×1 IMPLANT
BAND RUBBER #18 3X1/16 STRL (MISCELLANEOUS) ×2 IMPLANT
BENZOIN TINCTURE PRP APPL 2/3 (GAUZE/BANDAGES/DRESSINGS) ×1 IMPLANT
BLADE CLIPPER SURG (BLADE) IMPLANT
BUR CARBIDE MATCH 3.0 (BURR) IMPLANT
CANISTER SUCT 3000ML PPV (MISCELLANEOUS) ×1 IMPLANT
CLEANSER WND VASHE 34 (WOUND CARE) IMPLANT
DERMABOND ADVANCED .7 DNX12 (GAUZE/BANDAGES/DRESSINGS) ×1 IMPLANT
DRAPE C-ARM 42X72 X-RAY (DRAPES) IMPLANT
DRAPE LAPAROTOMY 100X72 PEDS (DRAPES) IMPLANT
DRAPE LAPAROTOMY 100X72X124 (DRAPES) ×1 IMPLANT
DRAPE MICROSCOPE SLANT 54X150 (MISCELLANEOUS) ×1 IMPLANT
DRAPE SURG 17X23 STRL (DRAPES) ×2 IMPLANT
DRSG OPSITE 4X5.5 SM (GAUZE/BANDAGES/DRESSINGS) IMPLANT
DRSG OPSITE POSTOP 4X8 (GAUZE/BANDAGES/DRESSINGS) IMPLANT
ELECTRODE REM PT RTRN 9FT ADLT (ELECTROSURGICAL) ×1 IMPLANT
GAUZE 4X4 16PLY ~~LOC~~+RFID DBL (SPONGE) IMPLANT
GAUZE SPONGE 4X4 12PLY STRL (GAUZE/BANDAGES/DRESSINGS) ×1 IMPLANT
GLOVE BIO SURGEON STRL SZ7 (GLOVE) IMPLANT
GLOVE BIO SURGEON STRL SZ8 (GLOVE) ×1 IMPLANT
GLOVE BIOGEL PI IND STRL 7.0 (GLOVE) IMPLANT
GOWN STRL REUS W/ TWL LRG LVL3 (GOWN DISPOSABLE) IMPLANT
GOWN STRL REUS W/ TWL XL LVL3 (GOWN DISPOSABLE) IMPLANT
GOWN STRL REUS W/TWL 2XL LVL3 (GOWN DISPOSABLE) IMPLANT
HEMOSTAT POWDER KIT SURGIFOAM (HEMOSTASIS) IMPLANT
KIT BASIN OR (CUSTOM PROCEDURE TRAY) ×1 IMPLANT
KIT TURNOVER KIT B (KITS) ×1 IMPLANT
NDL HYPO 22X1.5 SAFETY MO (MISCELLANEOUS) ×1 IMPLANT
NDL SPNL 20GX3.5 QUINCKE YW (NEEDLE) IMPLANT
NEEDLE HYPO 22X1.5 SAFETY MO (MISCELLANEOUS) ×1 IMPLANT
NEEDLE SPNL 20GX3.5 QUINCKE YW (NEEDLE) IMPLANT
NS IRRIG 1000ML POUR BTL (IV SOLUTION) ×1 IMPLANT
PACK LAMINECTOMY NEURO (CUSTOM PROCEDURE TRAY) ×1 IMPLANT
PATTIES SURGICAL .5 X.5 (GAUZE/BANDAGES/DRESSINGS) ×1 IMPLANT
PATTIES SURGICAL .5 X3 (DISPOSABLE) ×1 IMPLANT
PATTIES SURGICAL 1X1 (DISPOSABLE) ×1 IMPLANT
SPONGE T-LAP 4X18 ~~LOC~~+RFID (SPONGE) IMPLANT
STAPLER VISISTAT (STAPLE) ×1 IMPLANT
STRIP CLOSURE SKIN 1/2X4 (GAUZE/BANDAGES/DRESSINGS) ×1 IMPLANT
SUT VIC AB 0 CT1 18XCR BRD8 (SUTURE) IMPLANT
SUT VIC AB 2-0 CP2 18 (SUTURE) ×1 IMPLANT
SUT VIC AB 3-0 SH 8-18 (SUTURE) IMPLANT
SYR 30ML SLIP (SYRINGE) ×1 IMPLANT
TOWEL GREEN STERILE (TOWEL DISPOSABLE) ×1 IMPLANT
TOWEL GREEN STERILE FF (TOWEL DISPOSABLE) ×1 IMPLANT
WATER STERILE IRR 1000ML POUR (IV SOLUTION) ×1 IMPLANT

## 2023-10-29 NOTE — Consult Note (Signed)
 Reason for Consult: Epidural mass Referring Physician: Hospitalist  Kenneth Daigler is an 68 y.o. female.   HPI:  68 year old female seen in neurosurgical consultation regarding an epidural mass found on MRI done yesterday morning.  MRI was done without contrast.  Her pain started about 10 days ago.  This prompted 3 visits to the emergency department.  She also had a white count with a left shift.  Her pain was very severe.  It started in the right flank and radiated around to the lower abdomen and into the right hip and sometimes into the legs, especially with movement or with walking.  She denies numbness or tingling.  She feels in general somewhat weak.  He describes "pain all over."  The pain is severe and aching in character.  It does improve somewhat with medications.  MRI done yesterday shows epidural mass consistent with epidural abscess from T12-L3 and neurosurgical evaluation was requested.  Has a pure wick in place.  She did have some type of sore throat in the last few weeks and went to [facility] internal medicine but states she was told it was viral.  She was not put on antibiotics.  She describes no other recent infections.  She does have diabetes.  Past Medical History:  Diagnosis Date   Allergy    Diabetes mellitus without complication (HCC)     History reviewed. No pertinent surgical history.  Allergies  Allergen Reactions   Blueberry [Vaccinium Angustifolium] Swelling and Other (See Comments)    Lips swell    Social History   Tobacco Use   Smoking status: Never   Smokeless tobacco: Never  Substance Use Topics   Alcohol use: No    Family History  Problem Relation Age of Onset   Diabetes Father    Diabetes Maternal Grandmother      Review of Systems  Positive ROS: as above  All other systems have been reviewed and were otherwise negative with the exception of those mentioned in the HPI and as above.  Objective: Vital signs in last 24 hours: Temp:  [97.5 F (36.4  C)-98.2 F (36.8 C)] 98.2 F (36.8 C) (05/05 0754) Pulse Rate:  [90-94] 94 (05/05 0754) Resp:  [17-18] 18 (05/05 0754) BP: (145-174)/(80-94) 145/94 (05/05 0754) SpO2:  [93 %-100 %] 93 % (05/05 0754)  General Appearance: Alert but drifts off to sleep easily, cooperative, no distress, appears stated age, demonstrates pain with any generalized movement Head: Normocephalic, without obvious abnormality, atraumatic Eyes: PERRL, conjunctiva/corneas clear, EOM's intact     Throat: benign Neck: Supple, symmetrical, trachea midline Lungs:  respirations unlabored Heart: Regular rate and rhythm Abdomen: Soft, non-tender Extremities: Extremities normal, atraumatic, no cyanosis or edema Pulses: 2+ and symmetric all extremities Skin: Skin color, texture, turgor normal, no rashes or lesions  NEUROLOGIC:   Mental status: A&O x4, no aphasia, good attention span, Memory and fund of knowledge appear to be appropriate Motor Exam - grossly normal, normal tone and bulk except for a little weakness in the hip flexors but that may be related to pain Sensory Exam - grossly normal Reflexes: symmetric, no pathologic reflexes, No Hoffman's, No clonus Coordination - grossly normal Gait -not tested Balance -not tested Cranial Nerves: I: smell Not tested  II: visual acuity  OS: na  OD: na  II: visual fields Full to confrontation  II: pupils Equal, round, reactive to light  III,VII: ptosis None  III,IV,VI: extraocular muscles  Full ROM  V: mastication Normal  V: facial light touch  sensation  Normal  V,VII: corneal reflex  Present  VII: facial muscle function - upper  Normal  VII: facial muscle function - lower Normal  VIII: hearing Not tested  IX: soft palate elevation  Normal  IX,X: gag reflex Present  XI: trapezius strength  5/5  XI: sternocleidomastoid strength 5/5  XI: neck flexion strength  5/5  XII: tongue strength  Normal    Data Review Lab Results  Component Value Date   WBC 18.5 (H)  10/29/2023   HGB 11.6 (L) 10/29/2023   HCT 35.0 (L) 10/29/2023   MCV 82.9 10/29/2023   PLT 342 10/29/2023   Lab Results  Component Value Date   NA 134 (L) 10/29/2023   K 3.6 10/29/2023   CL 97 (L) 10/29/2023   CO2 25 10/29/2023   BUN 31 (H) 10/29/2023   CREATININE 0.75 10/29/2023   GLUCOSE 320 (H) 10/29/2023   Lab Results  Component Value Date   INR 1.2 10/24/2023    Radiology: DG Lumbar Spine 1 View Result Date: 10/27/2023 CLINICAL DATA:  1 week of low back pain.  No fall. EXAM: LUMBAR SPINE - 1 VIEW; PELVIS - 1-2 VIEW COMPARISON:  10/24/2023 CT FINDINGS: Pelvis: Single AP view of the pelvis demonstrates no fracture or dislocation. Hip joint spaces are maintained for age. Sacroiliac joints are symmetric. Left central pelvic calcification is secondary to a dystrophic fibroid when correlated with prior CT. Lumbar spine: Single AP view of the lumbar spine demonstrates grossly maintained vertebral body height. Spondylosis at L4-5 and L5-S1. IMPRESSION: No acute osseous abnormality. Limited evaluation lumbar spine secondary to lack of lateral view. Electronically Signed   By: Lore Rode M.D.   On: 10/27/2023 16:21   DG Pelvis 1-2 Views Result Date: 10/27/2023 CLINICAL DATA:  1 week of low back pain.  No fall. EXAM: LUMBAR SPINE - 1 VIEW; PELVIS - 1-2 VIEW COMPARISON:  10/24/2023 CT FINDINGS: Pelvis: Single AP view of the pelvis demonstrates no fracture or dislocation. Hip joint spaces are maintained for age. Sacroiliac joints are symmetric. Left central pelvic calcification is secondary to a dystrophic fibroid when correlated with prior CT. Lumbar spine: Single AP view of the lumbar spine demonstrates grossly maintained vertebral body height. Spondylosis at L4-5 and L5-S1. IMPRESSION: No acute osseous abnormality. Limited evaluation lumbar spine secondary to lack of lateral view. Electronically Signed   By: Lore Rode M.D.   On: 10/27/2023 16:21  MRI of lumbar spine was done without  contrast.  I have reviewed the films independently and I have read the report.  I agree with the report.  There is a dorsal epidural collection most consistent with epidural abscess causing fairly significant spinal stenosis at L1-2 and L2-3 but extends up to T12 and down to L3-4.  There is a grade 1 anterolisthesis at L4-5.  There is no obvious discitis or osteomyelitis.  I suspect this is coming from a facet joint, and there is signal change within the musculature at the thoracolumbar junction.    Assessment/Plan: Estimated body mass index is 27.55 kg/m as calculated from the following:   Height as of this encounter: 5\' 10"  (1.778 m).   Weight as of this encounter: 87.1 kg.   Unfortunate 68 year old female with a fairly extensive epidural mass dorsal to the thecal sac most consistent with epidural abscess and severe back pain.  I have described the imaging to she and her husband as best I can.  I have tried to answer all of their questions.  We have recommended urgent in the form of lumbar laminectomy for evacuation of the abscess.  We will send cultures.  I have described the surgery to she and her husband as best I can.  They understand the risks of the surgery include but are not limited to bleeding, infection, CSF leak, nerve root injury, spinal cord injury, numbness, weakness, paralysis, iatrogenic instability, need for further surgery, lack of relief of symptoms, worsening symptoms, and anesthesia risk including DVT pneumonia MI and death.  They agreed to proceed.  We will do so as soon as a neuro OR becomes available   Isadora Mar 10/29/2023 9:04 AM

## 2023-10-29 NOTE — Progress Notes (Signed)
 OT Cancellation Note  Patient Details Name: Nykerria Stuart MRN: [id number] DOB: [birth year]   Cancelled Treatment:    Reason Eval/Treat Not Completed: Patient at procedure or test/ unavailable- per notes urgent procedure for evacuation of abscess per neurosurgery.  Will follow as see as able.   Bary Boss, OT Acute Rehabilitation Services Office [phone] Secure Chat Preferred    Fredrich Jefferson 10/29/2023, 11:24 AM

## 2023-10-29 NOTE — Anesthesia Preprocedure Evaluation (Signed)
 Anesthesia Evaluation  Patient identified by MRN, date of birth, ID band Patient confused    Reviewed: Allergy & Precautions, NPO status , Patient's Chart, lab work & pertinent test results  History of Anesthesia Complications Negative for: history of anesthetic complications  Airway Mallampati: III  TM Distance: >3 FB Neck ROM: Full    Dental  (+) Dental Advisory Given   Pulmonary neg shortness of breath, neg sleep apnea, neg COPD, neg recent URI   breath sounds clear to auscultation       Cardiovascular negative cardio ROS  Rhythm:Regular     Neuro/Psych    GI/Hepatic negative GI ROS, Neg liver ROS,,,  Endo/Other  diabetes, Poorly Controlled  Lab Results      Component                Value               Date                      HGBA1C                   8.7 (H)             10/26/2023             Renal/GU Lab Results      Component                Value               Date                      NA                       134 (L)             10/29/2023                K                        3.6                 10/29/2023                CO2                      25                  10/29/2023                GLUCOSE                  320 (H)             10/29/2023                BUN                      31 (H)              10/29/2023                CREATININE               0.75                10/29/2023  CALCIUM                   9.4                 10/29/2023                GFR                      83.54               01/03/2022                GFRNONAA                 >60                 10/29/2023                Musculoskeletal negative musculoskeletal ROS (+)    Abdominal   Peds  Hematology  (+) Blood dyscrasia, anemia Lab Results      Component                Value               Date                      WBC                      18.5 (H)            10/29/2023                HGB                       11.6 (L)            10/29/2023                HCT                      35.0 (L)            10/29/2023                MCV                      82.9                10/29/2023                PLT                      342                 10/29/2023              Anesthesia Other Findings   Reproductive/Obstetrics                             Anesthesia Physical Anesthesia Plan  ASA: 3 and emergent  Anesthesia Plan: General   Post-op Pain Management: Ofirmev  IV (intra-op)*   Induction: Intravenous  PONV Risk Score and Plan: 3 and Ondansetron   Airway Management Planned: Oral ETT  Additional Equipment:   Intra-op Plan:   Post-operative Plan: Extubation in OR and Possible Post-op intubation/ventilation  Informed Consent: I have reviewed the patients History and Physical, chart, labs and  discussed the procedure including the risks, benefits and alternatives for the proposed anesthesia with the patient or authorized representative who has indicated his/her understanding and acceptance.     Dental advisory given and Consent reviewed with POA  Plan Discussed with: CRNA  Anesthesia Plan Comments:        Anesthesia Quick Evaluation

## 2023-10-29 NOTE — Op Note (Signed)
 10/29/2023  2:53 PM  PATIENT:  [place]  68 y.o. female  PRE-OPERATIVE DIAGNOSIS: Epidural abscess T12 and L3  POST-OPERATIVE DIAGNOSIS:  same  PROCEDURE: Decompressive lumbar laminectomy, medial facetectomy and foraminotomies T12- L1, L1-L2, L2-L3 for evacuation of epidural abscess  SURGEON:  Waymond Hailey, MD  ASSISTANTS: Jackee Marus, FNP  ANESTHESIA:   General  EBL: 100 ml  Total I/O In: 1000 [I.V.:1000] Out: -   BLOOD ADMINISTERED: none  DRAINS: Medium Hemovac  SPECIMEN:  none, 3 separate intraoperative cultures were sent  INDICATION FOR PROCEDURE: This patient presented with severe back pain and right hip pain. Imaging showed epidural compressive mass T12-L3 consistent with epidural abscess. The patient tried conservative measures without relief. Pain was debilitating. Recommended lumbar laminectomy for evacuation of the abscess. Patient understood the risks, benefits, and alternatives and potential outcomes and wished to proceed.   PROCEDURE DETAILS: The patient was taken to the operating room and after induction of adequate generalized endotracheal anesthesia, the patient was rolled into the prone position on chest rolls and all pressure points were padded. The lumbar region was cleaned and then prepped with DuraPrep and draped in the usual sterile fashion. 5 cc of local anesthesia was injected and then a dorsal midline incision was made and carried down to the lumbo sacral fascia. The fascia was opened and the paraspinous musculature was taken down in a subperiosteal fashion to expose T12-L1, L1 -L2, L2-L3 bilaterally.  During the exposure there was a significant amount of pus coming from the T12-L1 joint on the right and from the posterior lateral space.  We sent cultures twice from this area.  Intraoperative x-ray confirmed my level, and then I removed the spinous process of T12-L1 and L2 and used a combination of the high-speed drill and the Kerrison punches to  perform a hemilaminectomy, medial facetectomy, and foraminotomy at T12-L1, L1-2, and L2-3 bilaterally.  There was significant epidural abscess as well as epidural hematoma.  This was all evacuated with suction and with irrigation.  Cultures were taken from the epidural space at T12-L1.  The underlying yellow ligament was opened and removed in a piecemeal fashion to expose the underlying dura and exiting nerve roots. I undercut the lateral recess and dissected down until I was medial to and distal to the pedicle at each level.  We used a red rubber catheter to irrigate below L2-3. I then palpated with a coronary dilator along the nerve root and into the foramen to assure adequate decompression. I irrigated with 1 L of Vashe solution letting it rest in the open wound for several minutes with each washout.. Achieved hemostasis with bipolar cautery, lined the dura with Gelfoam, placed bilateral medium Hemovac drains, least a gram of powdered vancomycin in the subfascial space, and then closed the fascia with 0 Vicryl. I closed the subcutaneous tissues with 2-0 Vicryl and the subcuticular tissues with 3-0 Vicryl. The skin was then closed with benzoin and Steri-Strips. The drapes were removed, a sterile dressing was applied.  My nurse practitioner was involved in the exposure, safe retraction of the neural elements, the decompression and the closure. the patient was awakened from general anesthesia and transferred to the recovery room in stable condition. At the end of the procedure all sponge, needle and instrument counts were correct.    PLAN OF CARE: Admit to inpatient   PATIENT DISPOSITION:  PACU - hemodynamically stable.   Delay start of Pharmacological VTE agent (>24hrs) due to surgical blood loss or risk of  bleeding:  yes

## 2023-10-29 NOTE — Progress Notes (Signed)
 Pharmacy Antibiotic Note  Mathea Azlin is a 68 y.o. female admitted on 10/26/2023 with surgical prophylaxis.  Pharmacy has been consulted for vancomycin dosing.  Plan: Vancomcyin 1g IV ( pre op) then 500mg  every day (eAUC 418, scr 0.7, IBW)  Height: 5\' 10"  (177.8 cm) Weight: 39.5 kg (87 lb 1.6 oz) IBW/kg (Calculated) : 68.5  Temp (24hrs), Avg:97.9 F (36.6 C), Min:97.5 F (36.4 C), Max:98.2 F (36.8 C)  Recent Labs  Lab 10/24/23 1517 10/24/23 1545 10/26/23 2025 10/28/23 0506 10/29/23 0658  WBC 18.4*  --  16.5* 21.3* 18.5*  CREATININE 0.52  --  0.59 0.87 0.75  LATICACIDVEN  --  1.4  --   --   --     Estimated Creatinine Clearance: 41.4 mL/min (by C-G formula based on SCr of 0.75 mg/dL).    Allergies  Allergen Reactions   Blueberry [Vaccinium Angustifolium] Swelling and Other (See Comments)    Lips swell      Thank you for allowing pharmacy to be a part of this patient's care.  Angelo Kennedy Joylyn Duggin 10/29/2023 7:18 PM

## 2023-10-29 NOTE — Anesthesia Procedure Notes (Signed)
 Procedure Name: Intubation Date/Time: 10/29/2023 12:43 PM  Performed by: Viki Graver, CRNAPre-anesthesia Checklist: Patient identified, Emergency Drugs available, Suction available and Patient being monitored Patient Re-evaluated:Patient Re-evaluated prior to induction Oxygen  Delivery Method: Circle System Utilized Preoxygenation: Pre-oxygenation with 100% oxygen  Induction Type: IV induction Ventilation: Mask ventilation without difficulty Laryngoscope Size: Miller and 2 Grade View: Grade II Tube type: Oral Tube size: 7.0 mm Number of attempts: 1 Airway Equipment and Method: Stylet and Bite block Placement Confirmation: ETT inserted through vocal cords under direct vision, positive ETCO2 and breath sounds checked- equal and bilateral Secured at: 22 cm Tube secured with: Tape Dental Injury: Teeth and Oropharynx as per pre-operative assessment

## 2023-10-29 NOTE — Progress Notes (Signed)
 PROGRESS NOTE  Kristina Long ZOX:[id number] DOB: [birth year]   PCP: Arlys Berke, MD  Patient is from: Home.  Lives with husband.  Independently ambulates at baseline.  DOA: 10/26/2023 LOS: 2  Chief complaints Chief Complaint  Patient presents with   Drug Overdose     Brief Narrative / Interim history: 68 year old F with PMH of DM-2 returning to ED for the third time in a week with right back and flank pain.  [name]  She said she had extensive evaluation by different experts outpatient at that time without significant finding.  Eventually pain resolved on its own.  [name], and started feeling similar pain.  [name]  At that time, WBC 19.7 with left shift.  CMP, lipase, UA and CT abdomen and pelvis without significant finding.  Symptoms improved with pain medication and [name]   Patient returned to ED on 4/30 with the same complaints.  This time, she was febrile to 100.5.  WBC 18.4 with left shift.  CT renal stone study with grade 1 L4-L5 anterolisthesis.  Blood cultures obtained.  [name]  Patient returns to ED on 5/2 with right low back pain radiating to right groin and altered mental status.  She accidentally took 8 of the 200 mg ibuprofen, 2 oxycodone  and her muscle relaxers due to pain.    In ED, slightly tachycardic, tachypneic and hypertensive.  WBC 16.5 with left shift.  CMP with hyperglycemia.  UA with moderate Hgb, proteinuria and glucosuria.  Reportedly, she was in severe pain while in ED. MRI was ordered but delayed due to altered mental status.  She was admitted and transferred to [facility] for MRI under anesthesia.   Patient's encephalopathy resolved.  MRI thoracic/lumbar  spine concerning for epidural abscess extending from T11-L5 with resultant severe canal stenosis at L1-L2 and L2-L3 and moderate canal stenosis at L3-L4.  Neurosurgery consulted and patient was taken to the OR.    Subjective: Seen and examined earlier this morning.  No major events overnight of this morning.  MRI finding as above.  Neurosurgery consulted and talking to patient and patient's husband.  Objective: Vitals:   10/28/23 2015 10/29/23 0508 10/29/23 0754 10/29/23 1102  BP: (!) 158/83 (!) 174/86 (!) 145/94 (!) 184/100  Pulse: 94 90 94 (!) 103  Resp: 18 18 18 18   Temp: 97.9 F (36.6 C) (!) 97.5 F (36.4 C) 98.2 F (36.8 C) 98.2 F (36.8 C)  TempSrc: Oral Oral Oral   SpO2: 97% 100% 93% 92%  Weight:    39.5 kg  Height:    5\' 10"  (1.778 m)    Examination:  GENERAL: No apparent distress.  Nontoxic. HEENT: MMM.  Vision and hearing grossly intact.  NECK: Supple.  No apparent JVD.  RESP:  No IWOB.  Fair aeration bilaterally. CVS:  RRR. Heart sounds normal.  ABD/GI/GU: BS+. Abd soft, NTND.  MSK/EXT:  Moves extremities symmetrically but weak. No apparent deformity. No edema.  Tenderness over thoracic and lumbar spines on exam SKIN: no apparent skin lesion or wound NEURO: Awake, alert and oriented appropriately.  No apparent focal neuro deficit. PSYCH: Calm. Normal affect.   Consultants:  None  Procedures: None  Microbiology summarized:  4/30-blood cultures NGTD 5/5-blood culture pending  Assessment and plan: Epidural abscess with severe canal stenosis Severe lower back due to the above -Presents with lower back pain radiating to right groin for days. -MRI finding as above -Blood cultures on 4/30 NGTD -CRP and ESR markedly elevated -Neurosurgery consulted-taking patient to the OR -ID consulted -Pain control -Glycemic control  Acute encephalopathy: Likely iatrogenic from multiple sedating pain medications.  Resolved. -Minimize sedating medications -Reorientation  and delirium precaution  Uncontrolled NIDDM-2 with hyperglycemia: A1c 8.7%.  On metformin  and Jardiance  at home Recent Labs  Lab 10/28/23 2158 10/29/23 0631 10/29/23 1026 10/29/23 1109 10/29/23 1418  GLUCAP 243* 319* 298* 330* 254*  -Continue [name]-moderate -Increased Semglee from 15 to 20 units daily -Increased NovoLog from 5 to 7 units 3 times daily with meals -Further adjustment as appropriate  Proteinuria -Continue home lisinopril  Leukocytosis/bandemia: Likely due to the above. - Antibiotics per ID  Body mass index is 12.5 kg/m.           DVT prophylaxis:  SCD for now.  Code Status: Full code Family Communication: Updated patient's husband at bedside Level of care: Med-Surg Status is: Inpatient Remains inpatient appropriate because: Epidural abscess   Final disposition: CIR   55 minutes with more than 50% spent in reviewing records, counseling patient/family and coordinating care.   Sch Meds:  Scheduled Meds:  [MAR Hold] acetaminophen   1,000 mg Oral Q6H WA   [MAR Hold] amLODipine  10 mg Oral Daily   [MAR Hold] bisacodyl  10 mg Rectal Daily   [MAR Hold] insulin aspart  0-15 Units Subcutaneous TID WC   [MAR Hold] insulin aspart  0-5 Units Subcutaneous QHS   [MAR Hold] insulin aspart  7 Units Subcutaneous TID WC   [MAR Hold] insulin glargine-yfgn  25 Units Subcutaneous Daily   [MAR Hold] ketorolac   15 mg Intravenous Q6H   [MAR Hold] lidocaine  1 patch Transdermal Q2000   [MAR Hold] methocarbamol  (ROBAXIN ) injection  500 mg Intravenous Q6H   Continuous Infusions:  lactated ringers 10 mL/hr at 10/29/23 1157   PRN Meds:.0.9 % irrigation (POUR BTL), [MAR Hold]  HYDROmorphone  (DILAUDID ) injection, insulin aspart, [MAR Hold] loratadine, [MAR Hold] ondansetron  **OR** [MAR Hold] ondansetron  (ZOFRAN ) IV, [COMPLETED] polyethylene glycol **FOLLOWED BY** [MAR Hold] polyethylene glycol, [COMPLETED] senna-docusate **FOLLOWED BY** [MAR Hold] senna-docusate, Surgifoam 1  Gm with Thrombin 5,000 units (5 ml) topical solution, vancomycin, Vashe Wound Irrigation Optime  Antimicrobials: Anti-infectives (From admission, onward)    Start     Dose/Rate Route Frequency Ordered Stop   10/29/23 1424  vancomycin (VANCOCIN) powder          As needed 10/29/23 1424     10/29/23 1130  cefTRIAXone  (ROCEPHIN ) 2 g in sodium chloride  0.9 % 100 mL IVPB        2 g 200 mL/hr over 30 Minutes Intravenous  Once 10/29/23 1115 10/29/23 1255   10/29/23 1130  vancomycin (VANCOCIN) IVPB 1000 mg/200 mL premix        1,000 mg 200 mL/hr over 60 Minutes Intravenous  Once 10/29/23 1115 10/29/23 1230   10/29/23 1119  sodium chloride  0.9 % with cefTRIAXone  (ROCEPHIN ) ADS Med       Note to Pharmacy: Franceen Inches: cabinet override      10/29/23 1119 10/29/23 1310   10/29/23 1119  vancomycin (VANCOCIN) 1-5 GM/200ML-% IVPB       Note to Pharmacy: Franceen Inches: cabinet override      10/29/23 1119 10/29/23 1241  I have personally reviewed the following labs and images: CBC: Recent Labs  Lab 10/24/23 1517 10/26/23 2025 10/28/23 0506 10/29/23 0658  WBC 18.4* 16.5* 21.3* 18.5*  NEUTROABS 15.8* 13.3*  --   --   HGB 11.7* 11.1* 11.9* 11.6*  HCT 37.7 34.4* 37.4 35.0*  MCV 87.3 84.9 84.4 82.9  PLT 305 289 328 342   BMP &GFR Recent Labs  Lab 10/24/23 1517 10/26/23 2025 10/28/23 0506 10/29/23 0658  NA 134* 135 136 134*  K 3.4* 4.1 3.7 3.6  CL 98 99 98 97*  CO2 22 23 22 25   GLUCOSE 282* 310* 357* 320*  BUN 18 17 21  31*  CREATININE 0.52 0.59 0.87 0.75  CALCIUM  9.2 9.1 9.5 9.4  MG  --   --   --  2.1  PHOS  --   --   --  3.4   Estimated Creatinine Clearance: 41.4 mL/min (by C-G formula based on SCr of 0.75 mg/dL). Liver & Pancreas: Recent Labs  Lab 10/24/23 1517 10/26/23 2025 10/29/23 0658  AST 25 27  --   ALT 30 29  --   ALKPHOS 93 89  --   BILITOT 0.8 1.3*  --   PROT 8.6* 8.3*  --   ALBUMIN 3.4* 3.0* 2.4*   No results for input(s): "LIPASE", "AMYLASE" in  the last 168 hours.  No results for input(s): "AMMONIA" in the last 168 hours. Diabetic: Recent Labs    10/26/23 2025  HGBA1C 8.7*   Recent Labs  Lab 10/28/23 2158 10/29/23 0631 10/29/23 1026 10/29/23 1109 10/29/23 1418  GLUCAP 243* 319* 298* 330* 254*   Cardiac Enzymes: No results for input(s): "CKTOTAL", "CKMB", "CKMBINDEX", "TROPONINI" in the last 168 hours. No results for input(s): "PROBNP" in the last 8760 hours. Coagulation Profile: Recent Labs  Lab 10/24/23 1517  INR 1.2   Thyroid  Function Tests: No results for input(s): "TSH", "T4TOTAL", "FREET4", "T3FREE", "THYROIDAB" in the last 72 hours. Lipid Profile: No results for input(s): "CHOL", "HDL", "LDLCALC", "TRIG", "CHOLHDL", "LDLDIRECT" in the last 72 hours. Anemia Panel: No results for input(s): "VITAMINB12", "FOLATE", "FERRITIN", "TIBC", "IRON", "RETICCTPCT" in the last 72 hours. Urine analysis:    Component Value Date/Time   COLORURINE YELLOW 10/26/2023 2027   APPEARANCEUR CLEAR 10/26/2023 2027   LABSPEC 1.031 (H) 10/26/2023 2027   PHURINE 5.0 10/26/2023 2027   GLUCOSEU >=500 (A) 10/26/2023 2027   HGBUR MODERATE (A) 10/26/2023 2027   BILIRUBINUR NEGATIVE 10/26/2023 2027   KETONESUR 20 (A) 10/26/2023 2027   PROTEINUR >=300 (A) 10/26/2023 2027   NITRITE NEGATIVE 10/26/2023 2027   LEUKOCYTESUR NEGATIVE 10/26/2023 2027   Sepsis Labs: Invalid input(s): "PROCALCITONIN", "LACTICIDVEN"  Microbiology: Recent Results (from the past 240 hours)  Blood Culture (routine x 2)     Status: None   Collection Time: 10/24/23  3:12 PM   Specimen: BLOOD LEFT ARM  Result Value Ref Range Status   Specimen Description   Final    BLOOD LEFT ARM Performed at [facility], [address], [place], [place] [address]    Special Requests   Final    BOTTLES DRAWN AEROBIC AND ANAEROBIC Blood Culture results may not be optimal due to an inadequate volume of blood received in culture bottles Performed at [facility], [address], [place], [place] [address]    Culture   Final    NO GROWTH 5 DAYS Performed at [facility], [address], [place], [place] [address]  Report Status 10/29/2023 FINAL  Final  Blood Culture (routine x 2)     Status: None   Collection Time: 10/24/23  3:17 PM   Specimen: BLOOD RIGHT ARM  Result Value Ref Range Status   Specimen Description   Final    BLOOD RIGHT ARM Performed at [facility], [address] [place], [place] [address]    Special Requests   Final    BOTTLES DRAWN AEROBIC AND ANAEROBIC Blood Culture results may not be optimal due to an inadequate volume of blood received in culture bottles Performed at [name], [address], [place], [place] [address]    Culture   Final    NO GROWTH 5 DAYS Performed at [facility], [address], [place], [place] [address]    Report Status 10/29/2023 FINAL  Final  Resp panel by RT-PCR (RSV, Flu A&B, Covid) Anterior Nasal Swab     Status: None   Collection Time: 10/24/23  3:57 PM   Specimen: Anterior Nasal Swab  Result Value Ref Range Status   SARS Coronavirus 2 by RT PCR NEGATIVE NEGATIVE Final    Comment: (NOTE) SARS-CoV-2 target nucleic acids are NOT DETECTED.  The SARS-CoV-2 RNA is generally detectable in upper respiratory specimens during the acute phase of infection. The lowest concentration of SARS-CoV-2 viral copies this assay can detect is 138 copies/mL. A negative result does not preclude SARS-Cov-2 infection and should not be used as the sole basis for treatment or other patient management decisions. A negative result may occur with  improper specimen collection/handling, submission of specimen other than nasopharyngeal swab, presence of viral mutation(s) within the areas targeted by this assay, and inadequate number of viral copies(<138 copies/mL). A negative result must be combined with clinical observations, patient history, and  epidemiological information. The expected result is Negative.  Fact Sheet for Patients:  BloggerCourse.com  Fact Sheet for Healthcare Providers:  SeriousBroker.it  This test is no t yet approved or cleared by the [place]  FDA and  has been authorized for detection and/or diagnosis of SARS-CoV-2 by FDA under an Emergency Use Authorization (EUA). This EUA will remain  in effect (meaning this test can be used) for the duration of the COVID-19 declaration under Section 564(b)(1) of the Act, 21 U.S.C.section 360bbb-3(b)(1), unless the authorization is terminated  or revoked sooner.       Influenza A by PCR NEGATIVE NEGATIVE Final   Influenza B by PCR NEGATIVE NEGATIVE Final    Comment: (NOTE) The Xpert Xpress SARS-CoV-2/FLU/RSV plus assay is intended as an aid in the diagnosis of influenza from Nasopharyngeal swab specimens and should not be used as a sole basis for treatment. Nasal washings and aspirates are unacceptable for Xpert Xpress SARS-CoV-2/FLU/RSV testing.  Fact Sheet for Patients: BloggerCourse.com  Fact Sheet for Healthcare Providers: SeriousBroker.it  This test is not yet approved or cleared by the [place]  FDA and has been authorized for detection and/or diagnosis of SARS-CoV-2 by FDA under an Emergency Use Authorization (EUA). This EUA will remain in effect (meaning this test can be used) for the duration of the COVID-19 declaration under Section 564(b)(1) of the Act, 21 U.S.C. section 360bbb-3(b)(1), unless the authorization is terminated or revoked.     Resp Syncytial Virus by PCR NEGATIVE NEGATIVE Final    Comment: (NOTE) Fact Sheet for Patients: BloggerCourse.com  Fact Sheet for Healthcare Providers: SeriousBroker.it  This test is not yet approved or cleared by the [place]  FDA and has  been  authorized for detection and/or diagnosis of SARS-CoV-2 by FDA under an Emergency Use Authorization (EUA). This EUA will remain in effect (meaning this test can be used) for the duration of the COVID-19 declaration under Section 564(b)(1) of the Act, 21 U.S.C. section 360bbb-3(b)(1), unless the authorization is terminated or revoked.  Performed at [facility], [address] [place], [place] [address]     Radiology Studies: No results found.     Danaisha Celli T. Kaiyon Hynes [facility]  If 7PM-7AM, please contact night-coverage [email or web address] 10/29/2023, 2:40 PM

## 2023-10-29 NOTE — Inpatient Diabetes Management (Signed)
 Inpatient Diabetes Program Recommendations  AACE/ADA: New Consensus Statement on Inpatient Glycemic Control (2015)  Target Ranges:  Prepandial:   less than 140 mg/dL      Peak postprandial:   less than 180 mg/dL (1-2 hours)      Critically ill patients:  140 - 180 mg/dL   Lab Results  Component Value Date   GLUCAP 330 (H) 10/29/2023   HGBA1C 8.7 (H) 10/26/2023    Review of Glycemic Control  Latest Reference Range & Units 10/28/23 12:04 10/28/23 17:13 10/28/23 21:58 10/29/23 06:31 10/29/23 10:26 10/29/23 11:09  Glucose-Capillary 70 - 99 mg/dL 161 (H) 096 (H) 045 (H) 319 (H) 298 (H) 330 (H)   Diabetes history: DM 2 Outpatient Diabetes medications:  Januvia  100 mg daily, Metformin  1000 mg bid Current orders for Inpatient glycemic control:  Novolog 0-15 units tid with meals and HS Semglee 25 units daily Novolog 7 units tid with meals  Inpatient Diabetes Program Recommendations:    Agree with increases in insulins today.  Patient currently in surgery.  Will follow.   Thanks,  Josefa Ni, RN, BC-ADM Inpatient Diabetes Coordinator Pager [phone]  (8a-5p)

## 2023-10-29 NOTE — Progress Notes (Signed)
 Inpatient Rehab Admissions Coordinator:   CIR consult received. Note plan to go to OR emergently with NSGY today. I will follow up afterward.   Wandalee Gust, MS, CCC-SLP Rehab Admissions Coordinator  [phone] (celll) [phone] (office)

## 2023-10-29 NOTE — Transfer of Care (Signed)
 Immediate Anesthesia Transfer of Care Note  Patient: Kristina Long  Procedure(s) Performed: LUMBAR LAMINECTOMY FOR EPIDURAL ABSCESS (Back)  Patient Location: PACU  Anesthesia Type:General  Level of Consciousness: drowsy  Airway & Oxygen  Therapy: Patient Spontanous Breathing and Patient connected to face mask oxygen   Post-op Assessment: Report given to RN and Post -op Vital signs reviewed and stable  Post vital signs: Reviewed and stable  Last Vitals:  Vitals Value Taken Time  BP 96/70 10/29/23 1500  Temp    Pulse 88 10/29/23 1501  Resp 10 10/29/23 1501  SpO2 99 % 10/29/23 1501  Vitals shown include unfiled device data.  Last Pain:  Vitals:   10/29/23 1138  TempSrc:   PainSc: 0-No pain      Patients Stated Pain Goal: 3 (10/29/23 1138)  Complications: No notable events documented.

## 2023-10-30 ENCOUNTER — Encounter (HOSPITAL_COMMUNITY): Payer: Self-pay | Admitting: Neurological Surgery

## 2023-10-30 DIAGNOSIS — G061 Intraspinal abscess and granuloma: Secondary | ICD-10-CM | POA: Diagnosis not present

## 2023-10-30 DIAGNOSIS — Z794 Long term (current) use of insulin: Secondary | ICD-10-CM

## 2023-10-30 DIAGNOSIS — R7881 Bacteremia: Secondary | ICD-10-CM | POA: Insufficient documentation

## 2023-10-30 DIAGNOSIS — Z9889 Other specified postprocedural states: Secondary | ICD-10-CM | POA: Diagnosis not present

## 2023-10-30 DIAGNOSIS — M5442 Lumbago with sciatica, left side: Secondary | ICD-10-CM | POA: Diagnosis not present

## 2023-10-30 DIAGNOSIS — E119 Type 2 diabetes mellitus without complications: Secondary | ICD-10-CM | POA: Diagnosis not present

## 2023-10-30 DIAGNOSIS — G062 Extradural and subdural abscess, unspecified: Secondary | ICD-10-CM

## 2023-10-30 DIAGNOSIS — B953 Streptococcus pneumoniae as the cause of diseases classified elsewhere: Secondary | ICD-10-CM | POA: Diagnosis not present

## 2023-10-30 DIAGNOSIS — M5441 Lumbago with sciatica, right side: Secondary | ICD-10-CM | POA: Diagnosis not present

## 2023-10-30 LAB — BLOOD CULTURE ID PANEL (REFLEXED) - BCID2

## 2023-10-30 LAB — CBC
HCT: 34.1 % — ABNORMAL LOW (ref 36.0–46.0)
Hemoglobin: 11.3 g/dL — ABNORMAL LOW (ref 12.0–15.0)
MCH: 27.4 pg (ref 26.0–34.0)
MCHC: 33.1 g/dL (ref 30.0–36.0)
MCV: 82.8 fL (ref 80.0–100.0)
Platelets: 365 10*3/uL (ref 150–400)
RBC: 4.12 MIL/uL (ref 3.87–5.11)
RDW: 14.2 % (ref 11.5–15.5)
WBC: 17.7 10*3/uL — ABNORMAL HIGH (ref 4.0–10.5)
nRBC: 0 % (ref 0.0–0.2)

## 2023-10-30 LAB — RENAL FUNCTION PANEL
Albumin: 2.4 g/dL — ABNORMAL LOW (ref 3.5–5.0)
Anion gap: 16 — ABNORMAL HIGH (ref 5–15)
BUN: 23 mg/dL (ref 8–23)
CO2: 23 mmol/L (ref 22–32)
Calcium: 9.1 mg/dL (ref 8.9–10.3)
Chloride: 95 mmol/L — ABNORMAL LOW (ref 98–111)
Creatinine, Ser: 0.81 mg/dL (ref 0.44–1.00)
GFR, Estimated: >60 mL/min (ref >60)
Glucose, Bld: 303 mg/dL — ABNORMAL HIGH (ref 70–99)
Phosphorus: 2.4 mg/dL — ABNORMAL LOW (ref 2.5–4.6)
Potassium: 3.7 mmol/L (ref 3.5–5.1)
Sodium: 134 mmol/L — ABNORMAL LOW (ref 135–145)

## 2023-10-30 LAB — GLUCOSE, CAPILLARY
Glucose-Capillary: 312 mg/dL — ABNORMAL HIGH (ref 70–99)
Glucose-Capillary: 154 mg/dL — ABNORMAL HIGH (ref 70–99)
Glucose-Capillary: 208 mg/dL — ABNORMAL HIGH (ref 70–99)
Glucose-Capillary: 164 mg/dL — ABNORMAL HIGH (ref 70–99)

## 2023-10-30 LAB — MAGNESIUM: Magnesium: 1.9 mg/dL (ref 1.7–2.4)

## 2023-10-30 MED ORDER — ORAL CARE MOUTH RINSE: 15.0000 mL | OROMUCOSAL | Status: DC | PRN

## 2023-10-30 MED ORDER — SENNOSIDES-DOCUSATE SODIUM 8.6-50 MG PO TABS
2.0000 | ORAL_TABLET | Freq: Two times a day (BID) | ORAL | Status: DC
  Administered 2023-10-30 – 2023-11-06 (×12): 2 via ORAL
  Filled 2023-10-30 (×16): qty 2

## 2023-10-30 MED ORDER — SODIUM CHLORIDE 0.9 % IV SOLN
2.0000 g | Freq: Two times a day (BID) | INTRAVENOUS | Status: DC
  Administered 2023-10-30: 2 g via INTRAVENOUS
  Filled 2023-10-30: qty 20

## 2023-10-30 MED ORDER — MAGNESIUM CITRATE PO SOLN
1.0000 | Freq: Every day | ORAL | Status: DC | PRN
  Administered 2023-10-30: 1 via ORAL
  Filled 2023-10-30: qty 296

## 2023-10-30 MED ORDER — SODIUM CHLORIDE 0.9 % IV SOLN
2.0000 g | INTRAVENOUS | Status: DC
  Administered 2023-10-31 – 2023-11-07 (×8): 2 g via INTRAVENOUS
  Filled 2023-10-30 (×8): qty 20

## 2023-10-30 NOTE — Progress Notes (Signed)
 PHARMACY - PHYSICIAN COMMUNICATION CRITICAL VALUE ALERT - BLOOD CULTURE IDENTIFICATION (BCID)  Kristina Long is an 68 y.o. female who presented to [facility] on 10/26/2023 with a chief complaint of epidural abscess s/p OR 5/5   Name of physician (or Provider) Contacted: Laurence Pons   Current antibiotics: Vancomycin, Cefepime, Flagyl  Changes to prescribed antibiotics recommended:  DC Cefepime DC Flagyl Start Ceftriaxone  2g IV q12h (using q12h dosing with epidural abscess) Continue Vancomycin for now-defer to ID team  Results for orders placed or performed during the hospital encounter of 10/26/23  Blood Culture ID Panel (Reflexed) (Collected: 10/29/2023 10:27 AM)  Result Value Ref Range   Enterococcus faecalis NOT DETECTED NOT DETECTED   Enterococcus Faecium NOT DETECTED NOT DETECTED   Listeria monocytogenes NOT DETECTED NOT DETECTED   Staphylococcus species NOT DETECTED NOT DETECTED   Staphylococcus aureus (BCID) NOT DETECTED NOT DETECTED   Staphylococcus epidermidis NOT DETECTED NOT DETECTED   Staphylococcus lugdunensis NOT DETECTED NOT DETECTED   Streptococcus species DETECTED (A) NOT DETECTED   Streptococcus agalactiae NOT DETECTED NOT DETECTED   Streptococcus pneumoniae DETECTED (A) NOT DETECTED   Streptococcus pyogenes NOT DETECTED NOT DETECTED   A.calcoaceticus-baumannii NOT DETECTED NOT DETECTED   Bacteroides fragilis NOT DETECTED NOT DETECTED   Enterobacterales NOT DETECTED NOT DETECTED   Enterobacter cloacae complex NOT DETECTED NOT DETECTED   Escherichia coli NOT DETECTED NOT DETECTED   Klebsiella aerogenes NOT DETECTED NOT DETECTED   Klebsiella oxytoca NOT DETECTED NOT DETECTED   Klebsiella pneumoniae NOT DETECTED NOT DETECTED   Proteus species NOT DETECTED NOT DETECTED   Salmonella species NOT DETECTED NOT DETECTED   Serratia marcescens NOT DETECTED NOT DETECTED   Haemophilus influenzae NOT DETECTED NOT DETECTED   Neisseria meningitidis NOT DETECTED NOT DETECTED    Pseudomonas aeruginosa NOT DETECTED NOT DETECTED   Stenotrophomonas maltophilia NOT DETECTED NOT DETECTED   Candida albicans NOT DETECTED NOT DETECTED   Candida auris NOT DETECTED NOT DETECTED   Candida glabrata NOT DETECTED NOT DETECTED   Candida krusei NOT DETECTED NOT DETECTED   Candida parapsilosis NOT DETECTED NOT DETECTED   Candida tropicalis NOT DETECTED NOT DETECTED   Cryptococcus neoformans/gattii NOT DETECTED NOT DETECTED    Silvestre Drum, PharmD, BCPS Clinical Pharmacist Phone: [phone]

## 2023-10-30 NOTE — Progress Notes (Signed)
 Patient ID: Kristina Long, female   DOB: 12-29-1955, 68 y.o.   MRN: [id number] She looks good this morning.  She is not having much in the way of pain.  Her incision is clean dry and intact and she has good strength in her lower extremities.  Unfortunately she was somewhat delirious through the night and pulled out her IVs and her drains.  Hopefully the washout and antibiotics were enough to reduce bacterial load enough that she will not rebuild purulence in the operative bed since the drains were pulled out.  Continue IV antibiotics.  Await cultures.  Mobilize as tolerated

## 2023-10-30 NOTE — Care Management Important Message (Signed)
 Important Message  Patient Details  Name: Kristina Long MRN: [id number] Date of Birth: [birth year]   Important Message Given:  Yes - Medicare IM     Felix Host 10/30/2023, 3:00 PM

## 2023-10-30 NOTE — Consult Note (Signed)
 [facility]    Date of Admission:  10/26/2023     Total days of antibiotics 2               Reason for Consult: Epidural abscess   Referring Provider: Dr. Mae Schlossman Primary Care Provider: Arlys Berke, MD   ASSESSMENT:  Kristina Long is a 68 year old African-American female with recent onset of right-sided flank/back pain and found to have epidural abscess and Streptococcus pneumonia bacteremia.  Postop day 1 from decompressive lumbar laminectomy and evacuation of epidural abscess with surgical specimens showing gram-positive cocci on Gram stain and cultures pending.  Suspect source to be a sequela of recent infection in the setting of Prevnar 20 vaccination.  Discussed recommended plan of care to continue current dose of ceftriaxone  and monitor surgical specimens for culture results and adjustment of antibiotics as appropriate.  Check blood cultures for clearance of bacteremia.  TTE to check for endocarditis although rare.  Continue postoperative wound care per neurosurgery.  Discussed importance of blood sugar control to reduce risk of complicated healing and further infection.  Optimize protein intake.  Remaining medical and supportive care per internal medicine.  PLAN:  Continue current dose of ceftriaxone . Monitor surgical specimens for organisms Check blood cultures for clearance of bacteremia. Obtain TTE. Postoperative wound care per neurosurgery. Optimize blood sugar control and protein intake for healing. Remaining medical and supportive care per internal medicine.   Principal Problem:   Low back pain Active Problems:   Type 2 diabetes mellitus without complication, without long-term current use of insulin (HCC)   Dyslipidemia   Right flank pain   Acute encephalopathy   Epidural abscess   S/P lumbar laminectomy    acetaminophen   1,000 mg Oral Q6H   amLODipine  10 mg Oral Daily   bisacodyl  10 mg Rectal Daily   celecoxib  200 mg Oral Q12H    insulin aspart  0-15 Units Subcutaneous TID WC   insulin aspart  0-5 Units Subcutaneous QHS   insulin aspart  7 Units Subcutaneous TID WC   insulin glargine-yfgn  25 Units Subcutaneous Daily   senna  1 tablet Oral BID   sodium chloride  flush  3 mL Intravenous Q12H     HPI: Kristina Long is a 68 y.o. female with previous medical history of type 2 diabetes presenting with pain and decreased ability to walk.  Kristina Long was initially seen in the ED on on 10/22/2023 with 4-day history of right-sided flank/back pain radiating around to the abdomen.  CT abdomen/pelvis with no evidence of appendicitis, hydronephrosis, or other acute findings.  Discharged with methocarbamol .  Return to the ED on 10/24/2023 with continued pain that was worse with certain movements.  Febrile with temperature of 100.5 F.  CT renal study with no acute intra-abdominal or pelvic pathology, hydronephrosis, or nephrolithiasis.  Discharged home with pain medication.  Kristina Long returns to the ED on 10/26/2023 with right-sided flank pain and concern for taking too much medication.  Afebrile with leukocytosis of 16,500.  X-ray lumbar spine and pelvis with no acute osseous abnormalities.  MRI thoracic/lumbar spine with heterogeneous posterior epidural collection extending from T11/T12 to L4/L5 measuring 1.4 cm with resulting severe canal stenosis and concern for abscess with no findings of osteomyelitis/discitis.  Neurosurgery brought to the OR on 10/29/2023 pus encountered during exposure and sent for culture.  Blood cultures from 10/24/2023 with no growth.  Blood cultures from 10/29/2023 growing Streptococcus pneumoniae.  Surgical specimens  with Gram positive cocci in pairs on Gram stain and cultures pending.  Started on broad-spectrum antibiotics with vancomycin, cefepime, and metronidazole.  Kristina Long is postop day #1 having experience confusion and pulled out her IVs and her drains overnight. Had a viral infection a few weeks ago  and has since recovered. Respiratory panel here was negative. No recent antibiotics, procedures or travel. Has been vaccinated with Prevnar 20.    Review of Systems: Review of Systems  Constitutional:  Negative for chills, fever and weight loss.  Respiratory:  Negative for cough, shortness of breath and wheezing.   Cardiovascular:  Negative for chest pain and leg swelling.  Gastrointestinal:  Negative for abdominal pain, constipation, diarrhea, nausea and vomiting.  Skin:  Negative for rash.     Past Medical History:  Diagnosis Date   Allergy    Diabetes mellitus without complication (HCC)     Social History   Tobacco Use   Smoking status: Never   Smokeless tobacco: Never  Vaping Use   Vaping status: Never Used  Substance Use Topics   Alcohol use: No   Drug use: No    Family History  Problem Relation Age of Onset   Diabetes Father    Diabetes Maternal Grandmother     Allergies  Allergen Reactions   Blueberry [Vaccinium Angustifolium] Swelling and Other (See Comments)    Lips swell    OBJECTIVE: Blood pressure (!) 171/94, pulse (!) 109, temperature 98.9 F (37.2 C), resp. rate 17, height 5\' 10"  (1.778 m), weight 39.5 kg, SpO2 98%.  Physical Exam Constitutional:      General: She is not in acute distress.    Appearance: She is well-developed.  Cardiovascular:     Rate and Rhythm: Normal rate and regular rhythm.     Heart sounds: Normal heart sounds.  Pulmonary:     Effort: Pulmonary effort is normal.     Breath sounds: Normal breath sounds.  Skin:    General: Skin is warm and dry.  Neurological:     Mental Status: She is alert.  Psychiatric:        Mood and Affect: Mood normal.     Lab Results Lab Results  Component Value Date   WBC 17.7 (H) 10/30/2023   HGB 11.3 (L) 10/30/2023   HCT 34.1 (L) 10/30/2023   MCV 82.8 10/30/2023   PLT 365 10/30/2023    Lab Results  Component Value Date   CREATININE 0.81 10/30/2023   BUN 23 10/30/2023   NA 134  (L) 10/30/2023   K 3.7 10/30/2023   CL 95 (L) 10/30/2023   CO2 23 10/30/2023    Lab Results  Component Value Date   ALT 29 10/26/2023   AST 27 10/26/2023   ALKPHOS 89 10/26/2023   BILITOT 1.3 (H) 10/26/2023     Microbiology: Recent Results (from the past 240 hours)  Blood Culture (routine x 2)     Status: None   Collection Time: 10/24/23  3:12 PM   Specimen: BLOOD LEFT ARM  Result Value Ref Range Status   Specimen Description   Final    BLOOD LEFT ARM Performed at [facility], [address], [place], [place] [address]    Special Requests   Final    BOTTLES DRAWN AEROBIC AND ANAEROBIC Blood Culture results may not be optimal due to an inadequate volume of blood received in culture bottles Performed at [facility], [address], [place], [place] [address]  Culture   Final    NO GROWTH 5 DAYS Performed at [facility], [address], [place], [place] [address]    Report Status 10/29/2023 FINAL  Final  Blood Culture (routine x 2)     Status: None   Collection Time: 10/24/23  3:17 PM   Specimen: BLOOD RIGHT ARM  Result Value Ref Range Status   Specimen Description   Final    BLOOD RIGHT ARM Performed at [facility], [address] [place], [place] [address]    Special Requests   Final    BOTTLES DRAWN AEROBIC AND ANAEROBIC Blood Culture results may not be optimal due to an inadequate volume of blood received in culture bottles Performed at [facility], [address] [place], [place] [address]    Culture   Final    NO GROWTH 5 DAYS Performed at [facility], [address], [place], [place] [address]    Report Status 10/29/2023 FINAL  Final  Resp panel by RT-PCR (RSV, Flu A&B, Covid) Anterior Nasal Swab     Status: None   Collection Time: 10/24/23  3:57 PM   Specimen: Anterior Nasal Swab  Result Value Ref Range Status   SARS Coronavirus 2 by RT PCR NEGATIVE NEGATIVE Final    Comment:  (NOTE) SARS-CoV-2 target nucleic acids are NOT DETECTED.  The SARS-CoV-2 RNA is generally detectable in upper respiratory specimens during the acute phase of infection. The lowest concentration of SARS-CoV-2 viral copies this assay can detect is 138 copies/mL. A negative result does not preclude SARS-Cov-2 infection and should not be used as the sole basis for treatment or other patient management decisions. A negative result may occur with  improper specimen collection/handling, submission of specimen other than nasopharyngeal swab, presence of viral mutation(s) within the areas targeted by this assay, and inadequate number of viral copies(<138 copies/mL). A negative result must be combined with clinical observations, patient history, and epidemiological information. The expected result is Negative.  Fact Sheet for Patients:  BloggerCourse.com  Fact Sheet for Healthcare Providers:  SeriousBroker.it  This test is no t yet approved or cleared by the [place]  FDA and  has been authorized for detection and/or diagnosis of SARS-CoV-2 by FDA under an Emergency Use Authorization (EUA). This EUA will remain  in effect (meaning this test can be used) for the duration of the COVID-19 declaration under Section 564(b)(1) of the Act, 21 U.S.C.section 360bbb-3(b)(1), unless the authorization is terminated  or revoked sooner.       Influenza A by PCR NEGATIVE NEGATIVE Final   Influenza B by PCR NEGATIVE NEGATIVE Final    Comment: (NOTE) The Xpert Xpress SARS-CoV-2/FLU/RSV plus assay is intended as an aid in the diagnosis of influenza from Nasopharyngeal swab specimens and should not be used as a sole basis for treatment. Nasal washings and aspirates are unacceptable for Xpert Xpress SARS-CoV-2/FLU/RSV testing.  Fact Sheet for Patients: BloggerCourse.com  Fact Sheet for Healthcare  Providers: SeriousBroker.it  This test is not yet approved or cleared by the [place]  FDA and has been authorized for detection and/or diagnosis of SARS-CoV-2 by FDA under an Emergency Use Authorization (EUA). This EUA will remain in effect (meaning this test can be used) for the duration of the COVID-19 declaration under Section 564(b)(1) of the Act, 21 U.S.C. section 360bbb-3(b)(1), unless the authorization is terminated or revoked.     Resp Syncytial Virus by PCR NEGATIVE NEGATIVE Final    Comment: (NOTE)  Fact Sheet for Patients: BloggerCourse.com  Fact Sheet for Healthcare Providers: SeriousBroker.it  This test is not yet approved or cleared by the [place]  FDA and has been authorized for detection and/or diagnosis of SARS-CoV-2 by FDA under an Emergency Use Authorization (EUA). This EUA will remain in effect (meaning this test can be used) for the duration of the COVID-19 declaration under Section 564(b)(1) of the Act, 21 U.S.C. section 360bbb-3(b)(1), unless the authorization is terminated or revoked.  Performed at [facility], [address] [place], [place] [address]   Surgical pcr screen     Status: None   Collection Time: 10/29/23 10:02 AM   Specimen: Nasal Mucosa; Nasal Swab  Result Value Ref Range Status   MRSA, PCR NEGATIVE NEGATIVE Final   Staphylococcus aureus NEGATIVE NEGATIVE Final    Comment: (NOTE) The Xpert SA Assay (FDA approved for NASAL specimens in patients 73 years of age and older), is one component of a comprehensive surveillance program. It is not intended to diagnose infection nor to guide or monitor treatment. Performed at [facility], [address], [place], [place] [address]   Culture, blood (Routine X 2) w Reflex to ID Panel     Status: None (Preliminary result)   Collection Time: 10/29/23 10:18 AM   Specimen: BLOOD RIGHT ARM   Result Value Ref Range Status   Specimen Description BLOOD RIGHT ARM  Final   Special Requests   Final    BOTTLES DRAWN AEROBIC AND ANAEROBIC Blood Culture adequate volume   Culture   Final    NO GROWTH < 24 HOURS Performed at [facility], [address] [place], [place] [address]    Report Status PENDING  Incomplete  Culture, blood (Routine X 2) w Reflex to ID Panel     Status: None (Preliminary result)   Collection Time: 10/29/23 10:27 AM   Specimen: BLOOD LEFT ARM  Result Value Ref Range Status   Specimen Description BLOOD LEFT ARM  Final   Special Requests   Final    BOTTLES DRAWN AEROBIC AND ANAEROBIC BACTEROIDES CACCAE   Culture  Setup Time   Final    GRAM POSITIVE COCCI IN PAIRS IN CHAINS ANAEROBIC BOTTLE ONLY CRITICAL RESULT CALLED TO, READ BACK BY AND VERIFIED WITH: PHARMD J LEDFORD 10/30/2023 @ 0440 BY AB Performed at [facility], [address] [place], [place] [address]    Culture GRAM POSITIVE COCCI  Final   Report Status PENDING  Incomplete  Blood Culture ID Panel (Reflexed)     Status: Abnormal   Collection Time: 10/29/23 10:27 AM  Result Value Ref Range Status   Enterococcus faecalis NOT DETECTED NOT DETECTED Final   Enterococcus Faecium NOT DETECTED NOT DETECTED Final   Listeria monocytogenes NOT DETECTED NOT DETECTED Final   Staphylococcus species NOT DETECTED NOT DETECTED Final   Staphylococcus aureus (BCID) NOT DETECTED NOT DETECTED Final   Staphylococcus epidermidis NOT DETECTED NOT DETECTED Final   Staphylococcus lugdunensis NOT DETECTED NOT DETECTED Final   Streptococcus species DETECTED (A) NOT DETECTED Final    Comment: CRITICAL RESULT CALLED TO, READ BACK BY AND VERIFIED WITH: PHARMD J LEDFORD 10/30/2023 @ 0440 BY AB    Streptococcus agalactiae NOT DETECTED NOT DETECTED Final   Streptococcus pneumoniae DETECTED (A) NOT DETECTED Final    Comment: CRITICAL RESULT CALLED TO, READ BACK BY AND VERIFIED WITH: PHARMD J LEDFORD 10/30/2023 @ 0440  BY AB    Streptococcus pyogenes NOT DETECTED  NOT DETECTED Final   A.calcoaceticus-baumannii NOT DETECTED NOT DETECTED Final   Bacteroides fragilis NOT DETECTED NOT DETECTED Final   Enterobacterales NOT DETECTED NOT DETECTED Final   Enterobacter cloacae complex NOT DETECTED NOT DETECTED Final   Escherichia coli NOT DETECTED NOT DETECTED Final   Klebsiella aerogenes NOT DETECTED NOT DETECTED Final   Klebsiella oxytoca NOT DETECTED NOT DETECTED Final   Klebsiella pneumoniae NOT DETECTED NOT DETECTED Final   Proteus species NOT DETECTED NOT DETECTED Final   Salmonella species NOT DETECTED NOT DETECTED Final   Serratia marcescens NOT DETECTED NOT DETECTED Final   Haemophilus influenzae NOT DETECTED NOT DETECTED Final   Neisseria meningitidis NOT DETECTED NOT DETECTED Final   Pseudomonas aeruginosa NOT DETECTED NOT DETECTED Final   Stenotrophomonas maltophilia NOT DETECTED NOT DETECTED Final   Candida albicans NOT DETECTED NOT DETECTED Final   Candida auris NOT DETECTED NOT DETECTED Final   Candida glabrata NOT DETECTED NOT DETECTED Final   Candida krusei NOT DETECTED NOT DETECTED Final   Candida parapsilosis NOT DETECTED NOT DETECTED Final   Candida tropicalis NOT DETECTED NOT DETECTED Final   Cryptococcus neoformans/gattii NOT DETECTED NOT DETECTED Final    Comment: Performed at [facility], [address] [place], [place] [address]  Aerobic/Anaerobic Culture w Gram Stain (surgical/deep wound)     Status: None (Preliminary result)   Collection Time: 10/29/23  1:13 PM   Specimen: PATH Soft tissue  Result Value Ref Range Status   Specimen Description ABSCESS  Final   Special Requests LUMBAR ABSCESS NO 1  Final   Gram Stain   Final    FEW WBC PRESENT, PREDOMINANTLY PMN RARE GRAM POSITIVE COCCI IN PAIRS    Culture   Final    CULTURE REINCUBATED FOR BETTER GROWTH Performed at [facility], [address] [place], [place] [address]    Report Status PENDING  Incomplete   Aerobic/Anaerobic Culture w Gram Stain (surgical/deep wound)     Status: None (Preliminary result)   Collection Time: 10/29/23  1:23 PM   Specimen: PATH Soft tissue  Result Value Ref Range Status   Specimen Description ABSCESS  Final   Special Requests LUMBAR ABSCESS NO 2  Final   Gram Stain   Final    ABUNDANT WBC PRESENT, PREDOMINANTLY PMN FEW GRAM POSITIVE COCCI IN PAIRS    Culture   Final    CULTURE REINCUBATED FOR BETTER GROWTH Performed at [facility], [address] [place], [place] [address]    Report Status PENDING  Incomplete  Aerobic/Anaerobic Culture w Gram Stain (surgical/deep wound)     Status: None (Preliminary result)   Collection Time: 10/29/23  1:30 PM   Specimen: PATH Soft tissue  Result Value Ref Range Status   Specimen Description ABSCESS  Final   Special Requests LUMBAR ABSCESS NO 3  Final   Gram Stain   Final    RARE WBC PRESENT, PREDOMINANTLY PMN RARE GRAM POSITIVE COCCI IN PAIRS    Culture   Final    NO GROWTH < 24 HOURS Performed at [facility], [address], [place], [place] [address]    Report Status PENDING  Incomplete     Marlan Silva, NP [facility]  10/30/2023  9:44 AM

## 2023-10-30 NOTE — Progress Notes (Signed)
 Inpatient Rehab Admissions Coordinator:    I met with pt. To discuss potential CIR admit. She and husband are interested. Husband can take FMLA to care for Pt. After CIR. I will need to see how she does with therapy today to determine if she's a candidate, but if she participates well, I will send case to insurance for auth.   Wandalee Gust, MS, CCC-SLP Rehab Admissions Coordinator  [phone] (celll) [phone] (office)

## 2023-10-30 NOTE — Consult Note (Signed)
 Physical Medicine and Rehabilitation Consult Reason for Consult:CIR/epidural abscess Referring Physician: Dr Mae Schlossman   HPI: Kristina Long is a 68 y.o. R handed female with Hx of DM A1c 8.7 brought to ED 3 different times for fevers and severe back/hip and LE pain- after 3rd ED visit, they got scans of lumbar and thoracic spine- was found to severe epidural abscess from T11-L5- and severe canal stenosis and compression L1/2 and L2/3- she also was feeling weak due to compression.  She underwent surgery for decompression/lami and facetomy and foraminotomies on 10/29/23- she is POD#1, however over night she pulled her IV as well as Hemovac due to confusion.  She has also required a sitter to maintain her IV for IV ABX access.  Surgery is hopeful she won't need another drain, but per chart, it sounds like this might end up being an issue- but they are hopeful.   Pt does report she doesn't know when last had BM but feels bloated and constipated.   Said not hurting right now, but "I likely did feel pain sometime".   Said walking to bathroom to pee- appears this is correct base don function with therapy.   Walked 150 ft with CGA but mod A to get sit to stand.   Review of Systems  All other systems reviewed and are negative.  Limited by pt's delirium- able to voice in hospital but not why here, when LBM was, that pulled her Hemovac and IV's last night- and why she needs sitter.    Past Medical History:  Diagnosis Date   Allergy    Diabetes mellitus without complication [facility])    Past Surgical History:  Procedure Laterality Date   LUMBAR LAMINECTOMY FOR EPIDURAL ABSCESS N/A 10/29/2023   Procedure: LUMBAR LAMINECTOMY FOR EPIDURAL ABSCESS;  Surgeon: Joaquin Mulberry, MD;  Location: [facility] OR;  Service: Neurosurgery;  Laterality: N/A;   Family History  Problem Relation Age of Onset   Diabetes Father    Diabetes Maternal Grandmother    Social History:  reports that she has never smoked. She  has never used smokeless tobacco. She reports that she does not drink alcohol and does not use drugs. Allergies:  Allergies  Allergen Reactions   Blueberry [Vaccinium Angustifolium] Swelling and Other (See Comments)    Lips swell   Medications Prior to Admission  Medication Sig Dispense Refill   atorvastatin  (LIPITOR) 10 MG tablet Take 1 tablet (10 mg total) by mouth daily. (Patient taking differently: Take 10 mg by mouth at bedtime.) 90 tablet 3   calcium  carbonate (OS-CAL) 600 MG tablet Take 600 mg by mouth daily.     CLARITIN-D 12 HOUR 5-120 MG tablet Take 1 tablet by mouth 2 (two) times daily as needed for allergies.     lisinopril (ZESTRIL) 2.5 MG tablet Take 2.5 mg by mouth 2 (two) times daily.     metaxalone  (SKELAXIN ) 800 MG tablet Take 1 tablet (800 mg total) by mouth 3 (three) times daily. 21 tablet 0   metFORMIN  (GLUCOPHAGE -XR) 500 MG 24 hr tablet Take 2 tablets (1,000 mg total) by mouth 2 (two) times daily with a meal. 2 tabs in afternoon and 2 tabs night (Patient taking differently: Take 1,000 mg by mouth 2 (two) times daily with a meal.) 360 tablet 2   methocarbamol  (ROBAXIN ) 500 MG tablet Take 1 tablet (500 mg total) by mouth every 8 (eight) hours as needed. (Patient taking differently: Take 500 mg by mouth every 8 (eight) hours  as needed for muscle spasms.) 8 tablet 0   oxyCODONE -acetaminophen  (PERCOCET/ROXICET) 5-325 MG tablet Take 1 tablet by mouth every 6 (six) hours as needed for severe pain (pain score 7-10). 15 tablet 0   sitaGLIPtin  (JANUVIA ) 100 MG tablet Take 1 tablet (100 mg total) by mouth daily. 90 tablet 2   Lancets (ONETOUCH DELICA PLUS LANCET33G) MISC USE AS INSTRUCTED TO CHECK BLOOD SUGAR 2 TIMES PER DAY 100 each 0   ONETOUCH VERIO test strip USE AS DIRECTED TO TEST BLOOD SUGAR TWICE A DAY 100 strip 5    Home: Home Living Family/patient expects to be discharged to:: Private residence Living Arrangements: Spouse/significant other Available Help at Discharge:  Family, Available PRN/intermittently Type of Home: House Home Access: Stairs to enter Secretary/administrator of Steps: 1 Entrance Stairs-Rails: None Home Layout: One level Bathroom Shower/Tub: Psychologist, counselling, Engineer, manufacturing systems: Standard Bathroom Accessibility: Yes Home Equipment: Cane - single point, Environmental education officer Comments: husband works  Functional History: Prior Function Prior Level of Function : Independent/Modified Independent Mobility Comments: typically indep, active, drives, does classes at [employer] 2-3x/wk. poor historian regarding onset of weakness/pain and difficulty moving at home; states weeks, then states it was never this bad, etc. despite reorientation to recent ED visits due to pain Functional Status:  Mobility: Bed Mobility Overal bed mobility: Needs Assistance Bed Mobility: Rolling, Sidelying to Sit Rolling: Contact guard assist Sidelying to sit: Contact guard assist Supine to sit: Supervision, Used rails, HOB elevated General bed mobility comments: up in chair Transfers Overall transfer level: Needs assistance Equipment used: Rolling walker (2 wheels) Transfers: Sit to/from Stand Sit to Stand: Mod assist Bed to/from chair/wheelchair/BSC transfer type:: Step pivot, Stand pivot Stand pivot transfers: Min assist Step pivot transfers: Contact guard assist General transfer comment: assist for initial power up, rise, steady. Pt first attempt to stand unsuccessful pt stating "you have to get me up, then I will be good once I am up" Ambulation/Gait Ambulation/Gait assistance: Contact guard assist Gait Distance (Feet): 150 Feet Assistive device: Rolling walker (2 wheels) Gait Pattern/deviations: Step-through pattern, Decreased stride length, Trunk flexed, Drifts right/left General Gait Details: close guard for safety, cues for upright posture, placement in RW, staying in middle of hallway as pt often veering R. Gait velocity: decr     ADL: ADL Overall ADL's : Needs assistance/impaired Eating/Feeding: Set up, Sitting Grooming: Set up, Sitting Upper Body Dressing : Set up, Sitting Lower Body Dressing: Moderate assistance, Sit to/from stand Lower Body Dressing Details (indicate cue type and reason): Pt unable to follow cues for full figure four for donning socks Toilet Transfer: Minimal assistance, Stand-pivot, BSC/3in1, Ambulation, Contact guard assist Toilet Transfer Details (indicate cue type and reason): SPT to [facility], ambulation from [facility] to chair Toileting- Clothing Manipulation and Hygiene: Minimal assistance, Sit to/from stand Toileting - Clothing Manipulation Details (indicate cue type and reason): Min assist to steady while standing & adhere to back precautions Functional mobility during ADLs: Minimal assistance, Contact guard assist General ADL Comments: Increased cueing for compensatory techniques  Cognition: Cognition Orientation Level: Oriented to person, Disoriented to place, Disoriented to time, Disoriented to situation Cognition Arousal: Alert Behavior During Therapy: [facility] for tasks assessed/performed  Blood pressure (!) 171/94, pulse (!) 109, temperature 98.9 F (37.2 C), resp. rate 17, height 5\' 10"  (1.778 m), weight 39.5 kg, SpO2 98%. Physical Exam Vitals and nursing note reviewed. Exam conducted with a chaperone present.  Constitutional:      Appearance: She is obese.  Comments: Pt sitting up in bedside chair; somewhat confused, but periods of being lucent; sitter at bedside monitoring pt, NAD  HENT:     Head: Normocephalic and atraumatic.     Right Ear: External ear normal.     Left Ear: External ear normal.     Nose: Nose normal.     Mouth/Throat:     Mouth: Mucous membranes are dry.     Pharynx: Oropharynx is clear. No oropharyngeal exudate.  Eyes:     General:        Right eye: No discharge.        Left eye: No discharge.     Extraocular Movements: Extraocular movements intact.   Cardiovascular:     Rate and Rhythm: Normal rate and regular rhythm.     Heart sounds: Normal heart sounds. No murmur heard.    No gallop.  Pulmonary:     Effort: Pulmonary effort is normal. No respiratory distress.     Breath sounds: Normal breath sounds. No wheezing, rhonchi or rales.  Abdominal:     Comments: Protuberant, but appears very bloated/distended- more firm- NT, but c/o a lot of gas as well  Musculoskeletal:     Cervical back: Neck supple.     Comments:  Ue's 5-5/ throughout Doesn't cause pain to do exam LE-s HF 4+/5 and DF/PF 5-/5- KE- hard to test due to positioning, but at least 4/5  Skin:    General: Skin is warm and dry.     Comments: Cannot assess incision due to positioning in bedside chair IV R forearm- looks new  Neurological:     Comments: Confused- knows in hospital and what year it is, but confused on Bowels, peeing, pain; does deny foley- which is correct- doesn't remember pulling IV and Hemovac last night Intact to light touch in all 4 extremities  Psychiatric:     Comments: Flat, somewhat confused     Results for orders placed or performed during the hospital encounter of 10/26/23 (from the past 24 hours)  Glucose, capillary     Status: Abnormal   Collection Time: 10/29/23  9:56 PM  Result Value Ref Range   Glucose-Capillary 284 (H) 70 - 99 mg/dL  Renal function panel     Status: Abnormal   Collection Time: 10/30/23  6:21 AM  Result Value Ref Range   Sodium 134 (L) 135 - 145 mmol/L   Potassium 3.7 3.5 - 5.1 mmol/L   Chloride 95 (L) 98 - 111 mmol/L   CO2 23 22 - 32 mmol/L   Glucose, Bld 303 (H) 70 - 99 mg/dL   BUN 23 8 - 23 mg/dL   Creatinine, Ser 5.78 0.44 - 1.00 mg/dL   Calcium  9.1 8.9 - 10.3 mg/dL   Phosphorus 2.4 (L) 2.5 - 4.6 mg/dL   Albumin 2.4 (L) 3.5 - 5.0 g/dL   GFR, Estimated >46 >96 mL/min   Anion gap 16 (H) 5 - 15  Magnesium     Status: None   Collection Time: 10/30/23  6:21 AM  Result Value Ref Range   Magnesium 1.9 1.7 - 2.4  mg/dL  CBC     Status: Abnormal   Collection Time: 10/30/23  6:21 AM  Result Value Ref Range   WBC 17.7 (H) 4.0 - 10.5 K/uL   RBC 4.12 3.87 - 5.11 MIL/uL   Hemoglobin 11.3 (L) 12.0 - 15.0 g/dL   HCT 29.5 (L) 28.4 - 13.2 %   MCV 82.8 80.0 - 100.0 fL  MCH 27.4 26.0 - 34.0 pg   MCHC 33.1 30.0 - 36.0 g/dL   RDW 46.9 62.9 - 52.8 %   Platelets 365 150 - 400 K/uL   nRBC 0.0 0.0 - 0.2 %  Glucose, capillary     Status: Abnormal   Collection Time: 10/30/23  6:39 AM  Result Value Ref Range   Glucose-Capillary 312 (H) 70 - 99 mg/dL  Glucose, capillary     Status: Abnormal   Collection Time: 10/30/23 11:41 AM  Result Value Ref Range   Glucose-Capillary 154 (H) 70 - 99 mg/dL  Glucose, capillary     Status: Abnormal   Collection Time: 10/30/23  4:11 PM  Result Value Ref Range   Glucose-Capillary 208 (H) 70 - 99 mg/dL   DG Lumbar Spine 2-3 Views Result Date: 10/29/2023 CLINICAL DATA:  Elective surgery. EXAM: LUMBAR SPINE - 2-3 VIEW COMPARISON:  Preoperative imaging FINDINGS: Two lateral spot views of the lumbar spine submitted from the operating room. Film 1 demonstrates surgical instrument projecting posteriorly at the L1 level. Film 2 demonstrates surgical instruments projecting posteriorly at the L1 and upper L2 levels. IMPRESSION: Intraoperative localization during lumbar spine surgery. Electronically Signed   By: Chadwick Colonel M.D.   On: 10/29/2023 15:48     Assessment/Plan: Diagnosis: Epidural abscess s/p  L1-L3 decompressive lami and foraminotomies.  Does the need for close, 24 hr/day medical supervision in concert with the patient's rehab needs make it unreasonable for this patient to be served in a less intensive setting? Potentially Co-Morbidities requiring supervision/potential complications: Epidural abscess- on IV ABX for 6 weeks; confusion/delirium due to pain meds?; pt accidentally pulled hemovac on day #0 of surgery; on Rocephin  and Vanc IV; Severe constipation; DM- A1c 8.7;   Due to bladder management, bowel management, safety, skin/wound care, disease management, medication administration, pain management, and patient education, does the patient require 24 hr/day rehab nursing? Yes Does the patient require coordinated care of a physician, rehab nurse, therapy disciplines of PT and OT- might need SLP for delirium? to address physical and functional deficits in the context of the above medical diagnosis(es)? Yes Addressing deficits in the following areas: balance, endurance, locomotion, strength, transferring, bowel/bladder control, bathing, dressing, feeding, grooming, toileting, and cognition Can the patient actively participate in an intensive therapy program of at least 3 hrs of therapy per day at least 5 days per week? Yes The potential for patient to make measurable gains while on inpatient rehab is good Anticipated functional outcomes upon discharge from inpatient rehab are modified independent and supervision  with PT, modified independent and supervision with OT, independent with SLP. Estimated rehab length of stay to reach the above functional goals is: not clear yet-  Anticipated discharge destination:  pt if continues to make gains might be able to go home, however if pain/confusion get in way, would need CIR Overall Rehab/Functional Prognosis: good  RECOMMENDATIONS: This patient's condition is appropriate for continued rehabilitative care in the following setting: CIR and HH Therapy Patient has agreed to participate in recommended program. Potentially Note that insurance prior authorization may be required for reimbursement for recommended care.  Comment:  Pt feeling very constipated/bloated- suggest Sorbitol to get her to have BM/cleaned out.  2. Pain meds might be causing confusion- would assess this? 3. Will follow to see if appropriate for CIR or getting too good faster than expected. 4. D/W admissions coordinator 5. Thank you for this consult Celia Coles, MD 10/30/2023

## 2023-10-30 NOTE — PMR Pre-admission (Signed)
 PMR Admission Coordinator Pre-Admission Assessment  Patient: Kristina Long is an 68 y.o., female MRN: [id number] DOB: [birth year] Height: 5\' 10"  (177.8 cm) Weight: 39.5 kg  Insurance Information HMO:     PPO:      PCP:      IPA:      80/20:      OTHER:  PRIMARY: UHC Medicare      Policy#: [id number], Medicare: [id number]      Subscriber: pt CM Name:       Phone#: [phone] Pt. Won appeal on 11/04/23 and was approved for admit 5/8-5/14     Fax#: [phone] Pre-Cert#: [id number]       Employer:  Benefits:  Phone #:      Name:  Venson Ginger Date: 06/27/2023- still active Deductible: $200 ($200 met) OOP Max: $2,200 ($200 met) CIR: After the $200.00 deductible has been met then $0.00 Copayment per admit for Medicare-covered hospital care. SNF: After the $200.00 deductible has been met then $0.00 Copayment per day for Medicare-covered care; up to 100 days. Outpatient:  After the $200.00 deductible has been met then $0.00 Copayment for each Medicare-covered occupational therapy visit. Home Health: After the $200.00 deductible has been met then $0.00 Copayment for all home health visits provided by a home health agency when Medicare criteria are met. DME: After the $200.00 deductible has been met then $0.00 Copayment for Durable Medical Equipment Providers: in network   SECONDARY:       Policy#:      Phone#:  Artist:       Phone#:   The Data processing manager" for patients in Inpatient Rehabilitation Facilities with attached "Privacy Act Statement-Health Care Records" was provided and verbally reviewed with: Patient  Emergency Contact Information Contact Information     Name Relation Home Work Mobile   [place] A Spouse [phone]        Other Contacts   None on File     Current Medical History  Patient Admitting Diagnosis: Epidural abscess History of Present Illness: Kristina Long is a 68 y.o. R handed female with Hx of DM A1c 8.7 brought to ED 3  different times for fevers and severe back/hip and LE pain- after 3rd [facility] ED visit on 10/27/23, cans of lumbar and thoracic spine-showed severe epidural abscess from T11-L5- and severe canal stenosis and compression L1/2 and L2/3- she also was feeling weak due to compression.  She underwent surgery for decompression/lami and facetomy and foraminotomies on 10/29/23 with neurosurgery. Some delirium noted post operatively, but this has cleared. She was seen by PT/OT and they recommend CIR to assist return to PLOF.   Patient's medical record from [facility]  has been reviewed by the rehabilitation admission coordinator and physician.  Past Medical History  Past Medical History:  Diagnosis Date   Allergy    Diabetes mellitus without complication (HCC)     Has the patient had major surgery during 100 days prior to admission? Yes  Family History   family history includes Diabetes in her father and maternal grandmother.  Current Medications  Current Facility-Administered Medications:    0.9 %  sodium chloride  infusion, 250 mL, Intravenous, Continuous, Joaquin Mulberry, MD   0.9 % NaCl with KCl 20 mEq/ L  infusion, , Intravenous, Continuous, Joaquin Mulberry, MD, Stopped at 10/30/23 0310   acetaminophen  (TYLENOL ) tablet 1,000 mg, 1,000 mg, Oral, Q6H, Joaquin Mulberry, MD, 1,000 mg at 10/30/23 1154   amLODipine  (NORVASC ) tablet 10 mg,  10 mg, Oral, Daily, Joaquin Mulberry, MD, 10 mg at 10/30/23 4098   bisacodyl  (DULCOLAX) suppository 10 mg, 10 mg, Rectal, Daily, Joaquin Mulberry, MD, 10 mg at 10/30/23 0848   [START ON 10/31/2023] cefTRIAXone  (ROCEPHIN ) 2 g in sodium chloride  0.9 % 100 mL IVPB, 2 g, Intravenous, Q24H, Orlie Bjornstad, MD   celecoxib  (CELEBREX ) capsule 200 mg, 200 mg, Oral, Q12H, Joaquin Mulberry, MD, 200 mg at 10/30/23 1191   haloperidol  lactate (HALDOL ) injection 2 mg, 2 mg, Intravenous, Q6H PRN, Gonfa, Taye T, MD   HYDROmorphone  (DILAUDID )  injection 0.5 mg, 0.5 mg, Intravenous, Q2H PRN, Joaquin Mulberry, MD   insulin  aspart (novoLOG ) injection 0-15 Units, 0-15 Units, Subcutaneous, TID WC, Joaquin Mulberry, MD, 3 Units at 10/30/23 1154   insulin  aspart (novoLOG ) injection 0-5 Units, 0-5 Units, Subcutaneous, QHS, Joaquin Mulberry, MD, 3 Units at 10/29/23 2158   insulin  aspart (novoLOG ) injection 7 Units, 7 Units, Subcutaneous, TID WC, Joaquin Mulberry, MD, 7 Units at 10/30/23 1154   insulin  glargine-yfgn (SEMGLEE ) injection 25 Units, 25 Units, Subcutaneous, Daily, Joaquin Mulberry, MD, 25 Units at 10/30/23 4782   labetalol  (NORMODYNE ) injection 10 mg, 10 mg, Intravenous, Q2H PRN, Joaquin Mulberry, MD   loratadine  (CLARITIN ) tablet 10 mg, 10 mg, Oral, Daily PRN, Joaquin Mulberry, MD   magnesium  citrate solution 1 Bottle, 1 Bottle, Oral, Daily PRN, Gonfa, Taye T, MD   menthol -cetylpyridinium (CEPACOL) lozenge 3 mg, 1 lozenge, Oral, PRN **OR** phenol (CHLORASEPTIC) mouth spray 1 spray, 1 spray, Mouth/Throat, PRN, Joaquin Mulberry, MD   methocarbamol  (ROBAXIN ) tablet 500 mg, 500 mg, Oral, Q6H PRN, 500 mg at 10/30/23 0319 **OR** methocarbamol  (ROBAXIN ) injection 500 mg, 500 mg, Intravenous, Q6H PRN, Joaquin Mulberry, MD   ondansetron  (ZOFRAN ) tablet 4 mg, 4 mg, Oral, Q6H PRN **OR** ondansetron  (ZOFRAN ) injection 4 mg, 4 mg, Intravenous, Q6H PRN, Joaquin Mulberry, MD   Oral care mouth rinse, 15 mL, Mouth Rinse, PRN, Gonfa, Ella Gun, MD   oxyCODONE  (Oxy IR/ROXICODONE ) immediate release tablet 5 mg, 5 mg, Oral, Q3H PRN, Joaquin Mulberry, MD, 5 mg at 10/30/23 0319   [COMPLETED] polyethylene glycol (MIRALAX  / GLYCOLAX ) packet 17 g, 17 g, Oral, BID, 17 g at 10/28/23 2206 **FOLLOWED BY** polyethylene glycol (MIRALAX  / GLYCOLAX ) packet 17 g, 17 g, Oral, BID PRN, Joaquin Mulberry, MD   [COMPLETED] senna-docusate (Senokot-S) tablet 2 tablet, 2 tablet, Oral, BID, 2 tablet at 10/28/23 2206 **FOLLOWED BY** senna-docusate (Senokot-S)  tablet 2 tablet, 2 tablet, Oral, BID, Gonfa, Taye T, MD   sodium chloride  flush (NS) 0.9 % injection 3 mL, 3 mL, Intravenous, Q12H, Joaquin Mulberry, MD, 3 mL at 10/30/23 0854   sodium chloride  flush (NS) 0.9 % injection 3 mL, 3 mL, Intravenous, PRN, Joaquin Mulberry, MD  Patients Current Diet:  Diet Order             Diet Carb Modified Fluid consistency: Thin; Room service appropriate? Yes  Diet effective now                   Precautions / Restrictions Precautions Precautions: Back, Fall Precaution/Restrictions Comments: back precautions for comfort, though pt not complying Restrictions Weight Bearing Restrictions Per Provider Order: No   Has the patient had 2 or more falls or a fall with injury in the past year? No  Prior Activity Level Community (5-7x/wk): Pt active in the community PTA  Prior Functional Level Self Care: Did the  patient need help bathing, dressing, using the toilet or eating? Independent  Indoor Mobility: Did the patient need assistance with walking from room to room (with or without device)? Independent  Stairs: Did the patient need assistance with internal or external stairs (with or without device)? Independent  Functional Cognition: Did the patient need help planning regular tasks such as shopping or remembering to take medications? Independent  Patient Information Are you of Hispanic, Latino/a,or Spanish origin?: A. No, not of Hispanic, Latino/a, or Spanish origin What is your race?: B. Black or African American Do you need or want an interpreter to communicate with a doctor or health care staff?: 0. No  Patient's Response To:  Health Literacy and Transportation Is the patient able to respond to health literacy and transportation needs?: Yes Health Literacy - How often do you need to have someone help you when you read instructions, pamphlets, or other written material from your doctor or pharmacy?: Never In the past 12 months, has lack of  transportation kept you from medical appointments or from getting medications?: No In the past 12 months, has lack of transportation kept you from meetings, work, or from getting things needed for daily living?: No  Journalist, newspaper / Equipment Home Equipment: Cane - single point, Shower seat, Grab bars - tub/shower, Hand held shower head (Husbands cane)  Prior Device Use: Indicate devices/aids used by the patient prior to current illness, exacerbation or injury? None of the above  Current Functional Level Cognition  Orientation Level: Oriented to person, Disoriented to place, Disoriented to time, Disoriented to situation    Extremity Assessment (includes Sensation/Coordination)  Upper Extremity Assessment: Overall WFL for tasks assessed  Lower Extremity Assessment: Defer to PT evaluation    ADLs  Overall ADL's : Needs assistance/impaired Eating/Feeding: Set up, Sitting Grooming: Set up, Sitting Upper Body Dressing : Set up, Sitting Lower Body Dressing: Moderate assistance, Sit to/from stand Lower Body Dressing Details (indicate cue type and reason): Pt unable to follow cues for full figure four for donning socks Toilet Transfer: Minimal assistance, Stand-pivot, BSC/3in1, Ambulation, Contact guard assist Toilet Transfer Details (indicate cue type and reason): SPT to [facility], ambulation from [facility] to chair Toileting- Clothing Manipulation and Hygiene: Minimal assistance, Sit to/from stand Toileting - Clothing Manipulation Details (indicate cue type and reason): Min assist to steady while standing & adhere to back precautions Functional mobility during ADLs: Minimal assistance, Contact guard assist General ADL Comments: Increased cueing for compensatory techniques    Mobility  Overal bed mobility: Needs Assistance Bed Mobility: Rolling, Sidelying to Sit Rolling: Contact guard assist Sidelying to sit: Contact guard assist Supine to sit: Supervision, Used rails, HOB elevated General  bed mobility comments: Cues for sequencing no physical assist    Transfers  Overall transfer level: Needs assistance Equipment used: Rolling walker (2 wheels) Transfers: Sit to/from Stand, Bed to chair/wheelchair/BSC Sit to Stand: Min assist Bed to/from chair/wheelchair/BSC transfer type:: Step pivot, Stand pivot Stand pivot transfers: Min assist Step pivot transfers: Contact guard assist General transfer comment: Increased cueing for safety with use of RW    Ambulation / Gait / Stairs / Wheelchair Mobility  Ambulation/Gait General Gait Details: pivotal steps to recliner, further ambulation distance limited by c/o pain    Posture / Balance Dynamic Sitting Balance Sitting balance - Comments: educ on figure four technique to don socks; able to don R sock with significant increased time, repeatedly attempting to bend down to feet to don socks despite cues; assist to don L  sock Balance Overall balance assessment: Needs assistance Sitting-balance support: No upper extremity supported, Feet supported Sitting balance-Leahy Scale: Fair Sitting balance - Comments: educ on figure four technique to don socks; able to don R sock with significant increased time, repeatedly attempting to bend down to feet to don socks despite cues; assist to don L sock Standing balance support: Bilateral upper extremity supported, During functional activity, Reliant on assistive device for balance Standing balance-Leahy Scale: Fair Standing balance comment: Reliant on RW    Special needs/care consideration Skin surgical incision and Special service needs IV ABX via PICC   Previous Home Environment (from acute therapy documentation) Living Arrangements: Spouse/significant other Available Help at Discharge: Family, Available PRN/intermittently Type of Home: House Home Layout: One level Home Access: Stairs to enter, Level entry (Level entry for front foor) Entrance Stairs-Rails: None Entrance Stairs-Number of  Steps: 1 Bathroom Shower/Tub: Engineer, manufacturing systems: Yes How Accessible: Accessible via wheelchair Home Care Services: No Additional Comments: Husband works but is considering taking FMLA if needed  Discharge Living Setting Plans for Discharge Living Setting: Patient's home Type of Home at Discharge: House Discharge Home Layout: One level Discharge Home Access: Level entry Discharge Bathroom Shower/Tub: Walk-in shower Discharge Bathroom Toilet: Standard Discharge Bathroom Accessibility: Yes How Accessible: Accessible via walker, Accessible via wheelchair Does the patient have any problems obtaining your medications?: No  Social/Family/Support Systems Patient Roles: Spouse Contact Information: [phone] Anticipated Caregiver: Cincere Heath Anticipated Caregiver's Contact Information: Can take FMLA and provide 24/7 min A at d/c Discharge Plan Discussed with Primary Caregiver: Yes Is Caregiver In Agreement with Plan?: Yes  Goals Patient/Family Goal for Rehab: PT/OT Min A Expected length of stay: 12-14 days Pt/Family Agrees to Admission and willing to participate: Yes Program Orientation Provided & Reviewed with Pt/Caregiver Including Roles  & Responsibilities: Yes  Decrease burden of Care through IP rehab admission: Not anticipated  Possible need for SNF placement upon discharge: Not anticipated  Patient Condition: This patient's medical and functional status has changed since the consult dated: 11/02/23 in which the Rehabilitation Physician determined and documented that the patient's condition is appropriate for intensive rehabilitative care in an inpatient rehabilitation facility. See "History of Present Illness" (above) for medical update. Functional changes are: Pt. Now min A to CGA. Patient's medical and functional status update has been discussed with the Rehabilitation physician and patient remains appropriate for inpatient  rehabilitation. Will admit to inpatient rehab today.  Preadmission Screen Completed By:  Dorena Gander, CCC-SLP, 10/30/2023 12:41 PM ______________________________________________________________________   Discussed status with Dr. Rayleen Cal on 11/07/23 at 900 and received approval for admission today.  Admission Coordinator:  Dorena Gander, time 1006/Date 11/07/23

## 2023-10-30 NOTE — Progress Notes (Signed)
   10/30/23 0253  Provider Notification  Provider Name/Title J. DANIELS  Date Provider Notified 10/30/23  Time Provider Notified [phone]  Method of Notification Page  Notification Reason Change in status;Other (Comment) (pt removed hemovac to include cords. removed all 3 ivs. agitated. refusing bed alarm. high falls risk. charge notified.)  Provider response See new orders  Date of Provider Response 10/30/23  Time of Provider Response 0310   Charge nurse, Mahima and neurosurgery on-call made aware. Pt a/o x4, but confused. Up in chair at this time. Refuses bed/chair alarm. Educated on high falls risks and call bell purpose. Belongings in reach. Door left open for safety.

## 2023-10-30 NOTE — Hospital Course (Addendum)
 68 year old F with PMH of DM-2 returning to ED for the third time in a week with right back and flank pain and found to have epidural abscess and Streptococcus bacteremia. [name]  She said she had extensive evaluation by different experts outpatient at that time without significant finding.  Eventually pain resolved on its own.  [name], and started feeling similar pain.  [name]  At that time, WBC 19.7 with left shift.  CMP, lipase, UA and CT abdomen and pelvis without significant finding.  Symptoms improved with pain medication and [name]  [name]  This time, she was febrile to 100.5.  WBC 18.4 with left shift.  CT renal stone study with grade 1 L4-L5 anterolisthesis.  Blood cultures obtained.  [name] -Patient returns to ED on 5/2 with right low back pain radiating to right groin and altered mental status.  She accidentally took 8 of the 200 mg ibuprofen, 2 oxycodone  and her muscle relaxers due to pain.   In ED, slightly tachycardic, tachypneic and hypertensive.  WBC 16.5 with left shift.  CMP with hyperglycemia.  UA with moderate Hgb, proteinuria and glucosuria.  Reportedly, she was in severe pain while in ED. MRI was ordered but delayed due to altered mental status.  She was admitted and transferred to [facility] for MRI under anesthesia.  MRI thoracic/lumbar spine concerning for epidural abscess extending from T11-L5 with resultant severe canal stenosis at L1-L2 and L2-L3 and moderate canal stenosis at L3-L4.   -s/ p decompressive lumbar laminectomy, [name]   Blood and abscess culture with Streptococcus pneumonia. Antibiotic de-escalated to ceftriaxone  per ID.  Repeat blood cultures 10/30/23  NGTD. TEE 5/9>revealed mitral valve endocarditis. Cardiothoracic surgery team has been consulted and advise continued IV antibiotics and follow-up outpatient- they advised "IV antibiotics and monitoring. If vegetation progresses or she develops MR would need surgery" .[name]  Consultation: Neurosurgery ID CT surgery  Procedures: 5/5-decompressive lumbar laminectomy, medial facetectomy and foraminostomies from T12-L3 with evacuation of epidural abscess by Dr. Rochelle Chu  5/9 TEE 5/12 lumbar exploration, debridement of soft tissue and irrigation for wound drainage and soft tissue necrosis 5/13:PICC RUE  [name], [name]  Discharge diagnosis   Epidural abscess with severe canal stenosis Streptococcus pneumoniae bacteremia Severe lower back due to the above: S/P decompression by Dr. Rochelle Chu on 10/29/2023.TTE OK,TEE revealed mitral valve endocarditis. Cardiothoracic surgery evaluated-advised "IV antibiotics and monitoring. If vegetation progresses or she develops MR would need surgery". Having draining serosanguineous fluid from back despite reinforcing her wound with staples on last Friday- lumbar exploration, [name], [name], End Date:June 17th  Acute encephalopathy/delirium: Resolved.  Delirium precaution   Mitral valve endocarditis: Continue IV antibiotics see  discussion above w/ CTVS.   Uncontrolled NIDDM-2 with hyperglycemia:  A1c 8.7%. PTA on metformin  and Jardiance  continue holding metformin  cont Jardiance ,ssi, [name], [name] Recent Labs  Lab 11/06/23 0645 11/06/23 1125 11/06/23 1644 11/06/23 2200 11/07/23 0630  GLUCAP 165* 237* 143* 114* 135*    Essential hypertension:  Controlled on lisinipril- now at 5mg  daily and amlodipine  started here new- will do at 5mg - if bp soft stop AMLODIPINE   HLD: Continue statin.   Proteinuria Continue home lisinopril    Hyponatremia: Resolved.  Chronic normocytic anemia: Stable in ~ 9 to 10 g.  Monitor

## 2023-10-30 NOTE — Evaluation (Signed)
 Physical Therapy Re-Evaluation Patient Details Name: Kristina Long MRN: [id number] DOB: 02/03/1956 Today's Date: 10/30/2023  History of Present Illness  68 y.o. female admitted 10/26/23 with progressively worsening low back and R flank pain (seen in ED 4/28 and 4/30, eventually sent home); complicated by AMS, wandering in ED hallway, suspect related to pain meds. Lumbar CT showed Grade 1 L4-5 anterolisthesis. Pt unable to tolerate MRI. S/P decompressive lumbar laminectomy T12-L3 5/5.PMH includes DM.  Clinical Impression   Pt seen s/p decompressive lumbar laminectomy. Pt presents with LE weakness, impaired balance, mod difficulty mobilizing particularly getting in/out of chairs, LBP, and decreased activity tolerance. Pt to benefit from acute PT to address deficits. Pt ambulated in hallway with use of RW, pt struggling most with sit<>stand as pt with LE weakness and poor eccentric control. Pt normally is independent. Patient will benefit from intensive inpatient follow-up therapy, >3 hours/day. PT to progress mobility as tolerated, and will continue to follow acutely.          If plan is discharge home, recommend the following: A lot of help with walking and/or transfers;A lot of help with bathing/dressing/bathroom;Supervision due to cognitive status;Assistance with cooking/housework;Assist for transportation;Help with stairs or ramp for entrance   Can travel by private vehicle        Equipment Recommendations None recommended by PT  Recommendations for Other Services       Functional Status Assessment Patient has had a recent decline in their functional status and demonstrates the ability to make significant improvements in function in a reasonable and predictable amount of time.     Precautions / Restrictions Precautions Precautions: Back;Fall Recall of Precautions/Restrictions: Impaired Precaution/Restrictions Comments: reviewed BLT rules, no brace needed Restrictions Weight Bearing  Restrictions Per Provider Order: No      Mobility  Bed Mobility               General bed mobility comments: up in chair    Transfers Overall transfer level: Needs assistance Equipment used: Rolling walker (2 wheels) Transfers: Sit to/from Stand Sit to Stand: Mod assist           General transfer comment: assist for initial power up, rise, steady. Pt first attempt to stand unsuccessful pt stating "you have to get me up, then I will be good once I am up"    Ambulation/Gait Ambulation/Gait assistance: Contact guard assist Gait Distance (Feet): 150 Feet Assistive device: Rolling walker (2 wheels) Gait Pattern/deviations: Step-through pattern, Decreased stride length, Trunk flexed, Drifts right/left Gait velocity: decr     General Gait Details: close guard for safety, cues for upright posture, placement in RW, staying in middle of hallway as pt often veering R.  Stairs            Wheelchair Mobility     Tilt Bed    Modified Rankin (Stroke Patients Only)       Balance Overall balance assessment: Needs assistance Sitting-balance support: No upper extremity supported, Feet supported Sitting balance-Leahy Scale: Fair     Standing balance support: Bilateral upper extremity supported, During functional activity, Reliant on assistive device for balance Standing balance-Leahy Scale: Fair Standing balance comment: Reliant on RW                             Pertinent Vitals/Pain Pain Assessment Pain Assessment: Faces Faces Pain Scale: Hurts even more Pain Location: back, during transitional movements Pain Descriptors / Indicators: Discomfort, Guarding, Moaning Pain Intervention(s): Monitored  during session, Other (comment) (received tylenol  prior to arrival)    Home Living Family/patient expects to be discharged to:: Private residence Living Arrangements: Spouse/significant other Available Help at Discharge: Family;Available  PRN/intermittently Type of Home: House Home Access: Stairs to enter Entrance Stairs-Rails: None Entrance Stairs-Number of Steps: 1   Home Layout: One level Home Equipment: Cane - single point;Shower seat Additional Comments: husband works    Prior Function Prior Level of Function : Independent/Modified Independent             Mobility Comments: typically indep, active, drives, does classes at [employer] 2-3x/wk. poor historian regarding onset of weakness/pain and difficulty moving at home; states weeks, then states it was never this bad, etc. despite reorientation to recent ED visits due to pain       Extremity/Trunk Assessment   Upper Extremity Assessment Upper Extremity Assessment: Defer to OT evaluation    Lower Extremity Assessment Lower Extremity Assessment: Generalized weakness    Cervical / Trunk Assessment Cervical / Trunk Assessment: Back Surgery  Communication   Communication Communication: No apparent difficulties    Cognition Arousal: Alert Behavior During Therapy: WFL for tasks assessed/performed   PT - Cognitive impairments: Safety/Judgement, Attention                       PT - Cognition Comments: requires frequent safety cues when up and walking in hallway (veering R frequently, pt blames the RW). 0/3 recall of back precautions Following commands: Intact       Cueing Cueing Techniques: Verbal cues, Tactile cues     General Comments General comments (skin integrity, edema, etc.): Rehab admissions as well we sitter and husband present for session    Exercises     Assessment/Plan    PT Assessment Patient needs continued PT services  PT Problem List Decreased strength;Decreased activity tolerance;Decreased balance;Decreased mobility;Decreased cognition;Decreased knowledge of use of DME;Decreased safety awareness;Decreased knowledge of precautions;Pain       PT Treatment Interventions DME instruction;Gait training;Stair training;Functional  mobility training;Therapeutic activities;Therapeutic exercise;Balance training;Patient/family education;Cognitive remediation    PT Goals (Current goals can be found in the Care Plan section)  Acute Rehab PT Goals PT Goal Formulation: With patient/family Time For Goal Achievement: 11/13/23 Potential to Achieve Goals: Good    Frequency Min 5X/week     Co-evaluation               AM-PAC PT "6 Clicks" Mobility  Outcome Measure Help needed turning from your back to your side while in a flat bed without using bedrails?: A Little Help needed moving from lying on your back to sitting on the side of a flat bed without using bedrails?: A Lot Help needed moving to and from a bed to a chair (including a wheelchair)?: A Lot Help needed standing up from a chair using your arms (e.g., wheelchair or bedside chair)?: A Lot Help needed to walk in hospital room?: A Little Help needed climbing 3-5 steps with a railing? : A Lot 6 Click Score: 14    End of Session Equipment Utilized During Treatment: Gait belt Activity Tolerance: Patient limited by pain Patient left: in chair;with call bell/phone within reach;with family/visitor present;with nursing/sitter in room (sitter at bedside) Nurse Communication: Mobility status PT Visit Diagnosis: Other abnormalities of gait and mobility (R26.89);Muscle weakness (generalized) (M62.81);Pain    Time: 1610-9604 PT Time Calculation (min) (ACUTE ONLY): 18 min   Charges:   PT Evaluation $PT Re-evaluation: 1 Re-eval   PT General Charges $$  ACUTE PT VISIT: 1 Visit        Shirlene Doughty, PT DPT Acute Rehabilitation Services Secure Chat Preferred  Office [phone]   Ethridge Herder 10/30/2023, 1:29 PM

## 2023-10-30 NOTE — Progress Notes (Signed)
 PROGRESS NOTE  Kristina Long NFA:[id number] DOB: 10/31/1955   PCP: Arlys Berke, MD  Patient is from: Home.  Lives with husband.  Independently ambulates at baseline.  DOA: 10/26/2023 LOS: 3  Chief complaints Chief Complaint  Patient presents with   Drug Overdose     Brief Narrative / Interim history: 68 year old F with PMH of DM-2 returning to ED for the third time in a week with right back and flank pain.  [name]  She said she had extensive evaluation by different experts outpatient at that time without significant finding.  Eventually pain resolved on its own.  [name], and started feeling similar pain.  [name]  At that time, WBC 19.7 with left shift.  CMP, lipase, UA and CT abdomen and pelvis without significant finding.  Symptoms improved with pain medication and [name]   Patient returned to ED on 4/30 with the same complaints.  This time, she was febrile to 100.5.  WBC 18.4 with left shift.  CT renal stone study with grade 1 L4-L5 anterolisthesis.  Blood cultures obtained.  [name]  Patient returns to ED on 5/2 with right low back pain radiating to right groin and altered mental status.  She accidentally took 8 of the 200 mg ibuprofen, 2 oxycodone  and her muscle relaxers due to pain.    In ED, slightly tachycardic, tachypneic and hypertensive.  WBC 16.5 with left shift.  CMP with hyperglycemia.  UA with moderate Hgb, proteinuria and glucosuria.  Reportedly, she was in severe pain while in ED. MRI was ordered but delayed due to altered mental status.  She was admitted and transferred to [facility] for MRI under anesthesia.   Patient's encephalopathy resolved.  MRI thoracic/lumbar  spine concerning for epidural abscess extending from T11-L5 with resultant severe canal stenosis at L1-L2 and L2-L3 and moderate canal stenosis at L3-L4.    Patient underwent decompressive lumbar laminectomy, [name]  Blood and abscess culture with Streptococcus pneumonia.  Antibiotic de-escalated to ceftriaxone  per ID.       Subjective: Seen and examined earlier this morning.  Patient had delirium and pulled out her IV and surgical drain overnight.  Delirium seems to have resolved this morning.  She is awake, alert and oriented x 4.  Reports his back pain.  Denies numbness or tingling in her legs.  Denies focal weakness.  No bowel or bladder habit change other than constipation.  Objective: Vitals:   10/29/23 1912 10/30/23 0305 10/30/23 0622 10/30/23 0717  BP: (!) 157/81 (!) 155/79 (!) 160/88 (!) 171/94  Pulse:  (!) 105 92 (!) 109  Resp: 17 20 18 17   Temp: 98.2 F (36.8 C) 98.6 F (37 C) 97.6 F (36.4 C) 98.9 F (37.2 C)  TempSrc: Oral Oral Oral   SpO2: 97% 100% 100% 98%  Weight:      Height:        Examination:  GENERAL: No apparent distress.  Nontoxic. HEENT: MMM.  Vision and hearing grossly intact.  NECK: Supple.  No apparent JVD.  RESP:  No IWOB.  Fair aeration bilaterally. CVS:  RRR. Heart sounds normal.  ABD/GI/GU: BS+. Abd soft, NTND.  MSK/EXT: Changes in both legs.  Symmetric. SKIN: Surgical wound DCI. NEURO: Awake, alert and x 4..  No apparent focal neuro deficit. PSYCH: Calm. Normal affect.   Consultants:  None  Procedures: 5/5-decompressive lumbar laminectomy, medial facetectomy and foraminostomies from T12-L3 with evacuation of epidural abscess by Dr. Rochelle Chu   Microbiology summarized: 4/30-blood cultures NGTD 5/5-blood culture with Streptococcus pneumoniae 5/5-abscess culture with Streptococcus pneumoniae  Assessment and plan: Epidural abscess with  severe canal stenosis Streptococcus pneumoniae bacteremia Severe lower back due to the above -S/p decompressive lumbar laminectomy, medial facetectomy and foraminostomies from T12-L3 with evacuation of epidural abscess by Dr. Rochelle Chu -Presents with lower back pain radiating to right groin for days. -CRP and ESR markedly elevated. -MRI finding as above -Culture data as above. -Appreciate help by ID.  Recommended TTE, repeat blood culture -Pain and glycemic control -Bowel regimen to counteract constipation - PT/OT  Acute encephalopathy/delirium: Had delirium overnight and pulled out IV and drain.  Well-oriented this morning. -Minimize sedating medications -Reorientation and delirium precaution  Uncontrolled NIDDM-2 with hyperglycemia: A1c 8.7%.  On metformin  and Jardiance  at home Recent Labs  Lab 10/29/23 1458 10/29/23 1703 10/29/23 2156 10/30/23 0639 10/30/23 1141  GLUCAP 271* 213* 284* 312* 154*  -Continue [name]-moderate -Continue Semglee 20 units daily -Continue NovoLog 7 units 3 times daily with meals -Further adjustment as appropriate  Proteinuria -Continue home lisinopril  Leukocytosis/bandemia: Likely due to the above. - Antibiotics per ID  Hyponatremia: Mild - Continue monitoring  Leukocytosis/bandemia: Due to #1 - Improved  Body mass index is 12.5 kg/m. - Will obtain weight and height for accurate BMI           DVT prophylaxis:  SCD's Start: 10/29/23 [id number] for now.  Code Status: Full code Family Communication: None at bedside today Level of care: Med-Surg Status is: Inpatient Remains inpatient appropriate because: Epidural abscess and bacteremia   Final disposition: CIR?   55 minutes with more than 50% spent in reviewing records, counseling patient/family and coordinating care.   Sch Meds:  Scheduled Meds:  acetaminophen   1,000 mg Oral Q6H   amLODipine  10 mg Oral Daily   bisacodyl  10 mg Rectal Daily   celecoxib  200 mg Oral Q12H    insulin aspart  0-15 Units Subcutaneous TID WC   insulin aspart  0-5 Units Subcutaneous QHS   insulin aspart  7 Units Subcutaneous TID WC   insulin glargine-yfgn  25 Units Subcutaneous Daily   senna-docusate  2 tablet Oral BID   sodium chloride  flush  3 mL Intravenous Q12H   Continuous Infusions:  sodium chloride      0.9 % NaCl with KCl 20 mEq / L Stopped (10/30/23 0310)   [START ON 10/31/2023] cefTRIAXone  (ROCEPHIN )  IV     PRN Meds:.haloperidol lactate, HYDROmorphone  (DILAUDID ) injection, labetalol, loratadine, magnesium citrate, menthol-cetylpyridinium **OR** phenol, methocarbamol  **OR** methocarbamol  (ROBAXIN ) injection, ondansetron  **OR** ondansetron  (ZOFRAN ) IV, mouth rinse, oxyCODONE , [COMPLETED] polyethylene glycol **FOLLOWED BY** polyethylene glycol, sodium chloride  flush  Antimicrobials: Anti-infectives (From admission, onward)    Start     Dose/Rate Route Frequency Ordered Stop   10/31/23 0600  cefTRIAXone  (ROCEPHIN ) 2 g in sodium chloride  0.9 % 100 mL IVPB        2 g 200 mL/hr over 30 Minutes Intravenous Every 24 hours 10/30/23 1012     10/30/23 1000  vancomycin (VANCOREADY) IVPB 500 mg/100 mL  Status:  Discontinued        500 mg 100 mL/hr  over 60 Minutes Intravenous Every 24 hours 10/29/23 1915 10/30/23 1012   10/30/23 1000  cefTRIAXone  (ROCEPHIN ) 2 g in sodium chloride  0.9 % 100 mL IVPB  Status:  Discontinued        2 g 200 mL/hr over 30 Minutes Intravenous Every 12 hours 10/29/23 2202 10/29/23 2203   10/30/23 0600  cefTRIAXone  (ROCEPHIN ) 2 g in sodium chloride  0.9 % 100 mL IVPB  Status:  Discontinued        2 g 200 mL/hr over 30 Minutes Intravenous Every 12 hours 10/30/23 0457 10/30/23 1012   10/29/23 2300  metroNIDAZOLE (FLAGYL) IVPB 500 mg  Status:  Discontinued        500 mg 100 mL/hr over 60 Minutes Intravenous Every 12 hours 10/29/23 2202 10/30/23 0457   10/29/23 2300  ceFEPIme (MAXIPIME) 2 g in sodium chloride  0.9 % 100 mL IVPB  Status:  Discontinued        2  g 200 mL/hr over 30 Minutes Intravenous Every 12 hours 10/29/23 2209 10/30/23 0457   10/29/23 2200  cefTRIAXone  (ROCEPHIN ) 1 g in sodium chloride  0.9 % 100 mL IVPB  Status:  Discontinued        1 g 200 mL/hr over 30 Minutes Intravenous Every 12 hours 10/29/23 1855 10/29/23 2202   10/29/23 1424  vancomycin (VANCOCIN) powder  Status:  Discontinued          As needed 10/29/23 1424 10/29/23 1454   10/29/23 1130  cefTRIAXone  (ROCEPHIN ) 2 g in sodium chloride  0.9 % 100 mL IVPB        2 g 200 mL/hr over 30 Minutes Intravenous  Once 10/29/23 1115 10/29/23 1255   10/29/23 1130  vancomycin (VANCOCIN) IVPB 1000 mg/200 mL premix        1,000 mg 200 mL/hr over 60 Minutes Intravenous  Once 10/29/23 1115 10/29/23 1230   10/29/23 1119  sodium chloride  0.9 % with cefTRIAXone  (ROCEPHIN ) ADS Med       Note to Pharmacy: Franceen Inches: cabinet override      10/29/23 1119 10/29/23 1310   10/29/23 1119  vancomycin (VANCOCIN) 1-5 GM/200ML-% IVPB       Note to Pharmacy: Franceen Inches: cabinet override      10/29/23 1119 10/29/23 1241        I have personally reviewed the following labs and images: CBC: Recent Labs  Lab 10/24/23 1517 10/26/23 2025 10/28/23 0506 10/29/23 0658 10/30/23 0621  WBC 18.4* 16.5* 21.3* 18.5* 17.7*  NEUTROABS 15.8* 13.3*  --   --   --   HGB 11.7* 11.1* 11.9* 11.6* 11.3*  HCT 37.7 34.4* 37.4 35.0* 34.1*  MCV 87.3 84.9 84.4 82.9 82.8  PLT 305 289 328 342 365   BMP &GFR Recent Labs  Lab 10/24/23 1517 10/26/23 2025 10/28/23 0506 10/29/23 0658 10/30/23 0621  NA 134* 135 136 134* 134*  K 3.4* 4.1 3.7 3.6 3.7  CL 98 99 98 97* 95*  CO2 22 23 22 25 23   GLUCOSE 282* 310* 357* 320* 303*  BUN 18 17 21  31* 23  CREATININE 0.52 0.59 0.87 0.75 0.81  CALCIUM  9.2 9.1 9.5 9.4 9.1  MG  --   --   --  2.1 1.9  PHOS  --   --   --  3.4 2.4*   Estimated Creatinine Clearance: 40.9 mL/min (by C-G formula based on SCr of 0.81 mg/dL). Liver & Pancreas: Recent Labs  Lab  10/24/23 1517 10/26/23 2025 10/29/23 1478 10/30/23 2956  AST 25 27  --   --   ALT 30 29  --   --   ALKPHOS 93 89  --   --   BILITOT 0.8 1.3*  --   --   PROT 8.6* 8.3*  --   --   ALBUMIN 3.4* 3.0* 2.4* 2.4*   No results for input(s): "LIPASE", "AMYLASE" in the last 168 hours.  No results for input(s): "AMMONIA" in the last 168 hours. Diabetic: No results for input(s): "HGBA1C" in the last 72 hours.  Recent Labs  Lab 10/29/23 1458 10/29/23 1703 10/29/23 2156 10/30/23 0639 10/30/23 1141  GLUCAP 271* 213* 284* 312* 154*   Cardiac Enzymes: No results for input(s): "CKTOTAL", "CKMB", "CKMBINDEX", "TROPONINI" in the last 168 hours. No results for input(s): "PROBNP" in the last 8760 hours. Coagulation Profile: Recent Labs  Lab 10/24/23 1517  INR 1.2   Thyroid  Function Tests: No results for input(s): "TSH", "T4TOTAL", "FREET4", "T3FREE", "THYROIDAB" in the last 72 hours. Lipid Profile: No results for input(s): "CHOL", "HDL", "LDLCALC", "TRIG", "CHOLHDL", "LDLDIRECT" in the last 72 hours. Anemia Panel: No results for input(s): "VITAMINB12", "FOLATE", "FERRITIN", "TIBC", "IRON", "RETICCTPCT" in the last 72 hours. Urine analysis:    Component Value Date/Time   COLORURINE YELLOW 10/26/2023 2027   APPEARANCEUR CLEAR 10/26/2023 2027   LABSPEC 1.031 (H) 10/26/2023 2027   PHURINE 5.0 10/26/2023 2027   GLUCOSEU >=500 (A) 10/26/2023 2027   HGBUR MODERATE (A) 10/26/2023 2027   BILIRUBINUR NEGATIVE 10/26/2023 2027   KETONESUR 20 (A) 10/26/2023 2027   PROTEINUR >=300 (A) 10/26/2023 2027   NITRITE NEGATIVE 10/26/2023 2027   LEUKOCYTESUR NEGATIVE 10/26/2023 2027   Sepsis Labs: Invalid input(s): "PROCALCITONIN", "LACTICIDVEN"  Microbiology: Recent Results (from the past 240 hours)  Blood Culture (routine x 2)     Status: None   Collection Time: 10/24/23  3:12 PM   Specimen: BLOOD LEFT ARM  Result Value Ref Range Status   Specimen Description   Final    BLOOD LEFT  ARM Performed at [facility], [address], [place], [place] [address]    Special Requests   Final    BOTTLES DRAWN AEROBIC AND ANAEROBIC Blood Culture results may not be optimal due to an inadequate volume of blood received in culture bottles Performed at [facility], [address] [place], [place] [address]    Culture   Final    NO GROWTH 5 DAYS Performed at [facility], [address] [place], [place] [address]    Report Status 10/29/2023 FINAL  Final  Blood Culture (routine x 2)     Status: None   Collection Time: 10/24/23  3:17 PM   Specimen: BLOOD RIGHT ARM  Result Value Ref Range Status   Specimen Description   Final    BLOOD RIGHT ARM Performed at [facility], [address], [place], [place] [address]    Special Requests   Final    BOTTLES DRAWN AEROBIC AND ANAEROBIC Blood Culture results may not be optimal due to an inadequate volume of blood received in culture bottles Performed at [facility], [address] [place], [place] [address]    Culture   Final    NO GROWTH 5 DAYS Performed at [facility], [address], [place], [place] [address]    Report Status 10/29/2023 FINAL  Final  Resp panel by RT-PCR (RSV, Flu A&B, Covid) Anterior Nasal Swab     Status: None  Collection Time: 10/24/23  3:57 PM   Specimen: Anterior Nasal Swab  Result Value Ref Range Status   SARS Coronavirus 2 by RT PCR NEGATIVE NEGATIVE Final    Comment: (NOTE) SARS-CoV-2 target nucleic acids are NOT DETECTED.  The SARS-CoV-2 RNA is generally detectable in upper respiratory specimens during the acute phase of infection. The lowest concentration of SARS-CoV-2 viral copies this assay can detect is 138 copies/mL. A negative result does not preclude SARS-Cov-2 infection and should not be used as the sole basis for treatment or other patient management decisions. A negative result may occur with  improper specimen  collection/handling, submission of specimen other than nasopharyngeal swab, presence of viral mutation(s) within the areas targeted by this assay, and inadequate number of viral copies(<138 copies/mL). A negative result must be combined with clinical observations, patient history, and epidemiological information. The expected result is Negative.  Fact Sheet for Patients:  BloggerCourse.com  Fact Sheet for Healthcare Providers:  SeriousBroker.it  This test is no t yet approved or cleared by the [place]  FDA and  has been authorized for detection and/or diagnosis of SARS-CoV-2 by FDA under an Emergency Use Authorization (EUA). This EUA will remain  in effect (meaning this test can be used) for the duration of the COVID-19 declaration under Section 564(b)(1) of the Act, 21 U.S.C.section 360bbb-3(b)(1), unless the authorization is terminated  or revoked sooner.       Influenza A by PCR NEGATIVE NEGATIVE Final   Influenza B by PCR NEGATIVE NEGATIVE Final    Comment: (NOTE) The Xpert Xpress SARS-CoV-2/FLU/RSV plus assay is intended as an aid in the diagnosis of influenza from Nasopharyngeal swab specimens and should not be used as a sole basis for treatment. Nasal washings and aspirates are unacceptable for Xpert Xpress SARS-CoV-2/FLU/RSV testing.  Fact Sheet for Patients: BloggerCourse.com  Fact Sheet for Healthcare Providers: SeriousBroker.it  This test is not yet approved or cleared by the [place]  FDA and has been authorized for detection and/or diagnosis of SARS-CoV-2 by FDA under an Emergency Use Authorization (EUA). This EUA will remain in effect (meaning this test can be used) for the duration of the COVID-19 declaration under Section 564(b)(1) of the Act, 21 U.S.C. section 360bbb-3(b)(1), unless the authorization is terminated or revoked.     Resp Syncytial  Virus by PCR NEGATIVE NEGATIVE Final    Comment: (NOTE) Fact Sheet for Patients: BloggerCourse.com  Fact Sheet for Healthcare Providers: SeriousBroker.it  This test is not yet approved or cleared by the [place]  FDA and has been authorized for detection and/or diagnosis of SARS-CoV-2 by FDA under an Emergency Use Authorization (EUA). This EUA will remain in effect (meaning this test can be used) for the duration of the COVID-19 declaration under Section 564(b)(1) of the Act, 21 U.S.C. section 360bbb-3(b)(1), unless the authorization is terminated or revoked.  Performed at [facility], [address] [place], [place] [address]   Surgical pcr screen     Status: None   Collection Time: 10/29/23 10:02 AM   Specimen: Nasal Mucosa; Nasal Swab  Result Value Ref Range Status   MRSA, PCR NEGATIVE NEGATIVE Final   Staphylococcus aureus NEGATIVE NEGATIVE Final    Comment: (NOTE) The Xpert SA Assay (FDA approved for NASAL specimens in patients 35 years of age and older), is one component of a comprehensive surveillance program. It is not intended to diagnose infection nor to guide or monitor treatment. Performed at [facility], [address] [place], [place]  [address]   Culture, blood (Routine X 2) w Reflex to ID Panel     Status: None (Preliminary result)   Collection Time: 10/29/23 10:18 AM   Specimen: BLOOD RIGHT ARM  Result Value Ref Range Status   Specimen Description BLOOD RIGHT ARM  Final   Special Requests   Final    BOTTLES DRAWN AEROBIC AND ANAEROBIC Blood Culture adequate volume   Culture   Final    NO GROWTH < 24 HOURS Performed at [facility], [address], [place], [place] [address]    Report Status PENDING  Incomplete  Culture, blood (Routine X 2) w Reflex to ID Panel     Status: None (Preliminary result)   Collection Time: 10/29/23 10:27 AM   Specimen: BLOOD LEFT ARM   Result Value Ref Range Status   Specimen Description BLOOD LEFT ARM  Final   Special Requests   Final    BOTTLES DRAWN AEROBIC AND ANAEROBIC BACTEROIDES CACCAE   Culture  Setup Time   Final    GRAM POSITIVE COCCI IN PAIRS IN CHAINS ANAEROBIC BOTTLE ONLY CRITICAL RESULT CALLED TO, READ BACK BY AND VERIFIED WITH: PHARMD J LEDFORD 10/30/2023 @ 0440 BY AB Performed at [facility], [address], [place], [place] [address]    Culture GRAM POSITIVE COCCI  Final   Report Status PENDING  Incomplete  Blood Culture ID Panel (Reflexed)     Status: Abnormal   Collection Time: 10/29/23 10:27 AM  Result Value Ref Range Status   Enterococcus faecalis NOT DETECTED NOT DETECTED Final   Enterococcus Faecium NOT DETECTED NOT DETECTED Final   Listeria monocytogenes NOT DETECTED NOT DETECTED Final   Staphylococcus species NOT DETECTED NOT DETECTED Final   Staphylococcus aureus (BCID) NOT DETECTED NOT DETECTED Final   Staphylococcus epidermidis NOT DETECTED NOT DETECTED Final   Staphylococcus lugdunensis NOT DETECTED NOT DETECTED Final   Streptococcus species DETECTED (A) NOT DETECTED Final    Comment: CRITICAL RESULT CALLED TO, READ BACK BY AND VERIFIED WITH: PHARMD J LEDFORD 10/30/2023 @ 0440 BY AB    Streptococcus agalactiae NOT DETECTED NOT DETECTED Final   Streptococcus pneumoniae DETECTED (A) NOT DETECTED Final    Comment: CRITICAL RESULT CALLED TO, READ BACK BY AND VERIFIED WITH: PHARMD J LEDFORD 10/30/2023 @ 0440 BY AB    Streptococcus pyogenes NOT DETECTED NOT DETECTED Final   A.calcoaceticus-baumannii NOT DETECTED NOT DETECTED Final   Bacteroides fragilis NOT DETECTED NOT DETECTED Final   Enterobacterales NOT DETECTED NOT DETECTED Final   Enterobacter cloacae complex NOT DETECTED NOT DETECTED Final   Escherichia coli NOT DETECTED NOT DETECTED Final   Klebsiella aerogenes NOT DETECTED NOT DETECTED Final   Klebsiella oxytoca NOT DETECTED NOT DETECTED Final   Klebsiella pneumoniae NOT  DETECTED NOT DETECTED Final   Proteus species NOT DETECTED NOT DETECTED Final   Salmonella species NOT DETECTED NOT DETECTED Final   Serratia marcescens NOT DETECTED NOT DETECTED Final   Haemophilus influenzae NOT DETECTED NOT DETECTED Final   Neisseria meningitidis NOT DETECTED NOT DETECTED Final   Pseudomonas aeruginosa NOT DETECTED NOT DETECTED Final   Stenotrophomonas maltophilia NOT DETECTED NOT DETECTED Final   Candida albicans NOT DETECTED NOT DETECTED Final   Candida auris NOT DETECTED NOT DETECTED Final   Candida glabrata NOT DETECTED NOT DETECTED Final   Candida krusei NOT DETECTED NOT DETECTED Final   Candida parapsilosis NOT DETECTED NOT DETECTED Final   Candida tropicalis NOT DETECTED NOT DETECTED Final   Cryptococcus  neoformans/gattii NOT DETECTED NOT DETECTED Final    Comment: Performed at [facility], [address], [place], [place] [address]  Aerobic/Anaerobic Culture w Gram Stain (surgical/deep wound)     Status: None (Preliminary result)   Collection Time: 10/29/23  1:13 PM   Specimen: PATH Soft tissue  Result Value Ref Range Status   Specimen Description ABSCESS  Final   Special Requests LUMBAR ABSCESS NO 1  Final   Gram Stain   Final    FEW WBC PRESENT, PREDOMINANTLY PMN RARE GRAM POSITIVE COCCI IN PAIRS    Culture   Final    FEW STREPTOCOCCUS PNEUMONIAE CONSISTENT WITH PREVIOUS RESULT SEE [id number] FOR SUSCEPTIBILITIES Performed at [facility], [address], [place], [place] [address]    Report Status PENDING  Incomplete  Aerobic/Anaerobic Culture w Gram Stain (surgical/deep wound)     Status: None (Preliminary result)   Collection Time: 10/29/23  1:23 PM   Specimen: PATH Soft tissue  Result Value Ref Range Status   Specimen Description ABSCESS  Final   Special Requests LUMBAR ABSCESS NO 2  Final   Gram Stain   Final    ABUNDANT WBC PRESENT, PREDOMINANTLY PMN FEW GRAM POSITIVE COCCI IN PAIRS    Culture   Final    MODERATE STREPTOCOCCUS  PNEUMONIAE CULTURE REINCUBATED FOR BETTER GROWTH Performed at [facility], [address], [place], [place] [address]    Report Status PENDING  Incomplete  Aerobic/Anaerobic Culture w Gram Stain (surgical/deep wound)     Status: None (Preliminary result)   Collection Time: 10/29/23  1:30 PM   Specimen: PATH Soft tissue  Result Value Ref Range Status   Specimen Description ABSCESS  Final   Special Requests LUMBAR ABSCESS NO 3  Final   Gram Stain   Final    RARE WBC PRESENT, PREDOMINANTLY PMN RARE GRAM POSITIVE COCCI IN PAIRS    Culture   Final    NO GROWTH < 24 HOURS Performed at [facility], [address] [place], [place] [address]    Report Status PENDING  Incomplete    Radiology Studies: No results found.     Shemica Meath T. Elayna Tobler [facility]  If 7PM-7AM, please contact night-coverage [email or web address] 10/30/2023, 2:56 PM

## 2023-10-30 NOTE — Progress Notes (Signed)
   10/30/23 8119  Provider Notification  Provider Name/Title Sherley Distad  Date Provider Notified 10/30/23  Time Provider Notified [phone]  Method of Notification Page;Call  Notification Reason Other (Comment) (hemovac removed by pt)  Provider response No new orders  Date of Provider Response 10/30/23  Time of Provider Response [phone]

## 2023-10-30 NOTE — Evaluation (Signed)
 Occupational Therapy Evaluation Patient Details Name: Kristina Long MRN: [id number] DOB: 09/13/1955 Today's Date: 10/30/2023   History of Present Illness   68 y.o. female admitted 10/26/23 with progressively worsening low back and R flank pain (seen in ED 4/28 and 4/30, eventually sent home); complicated by AMS, wandering in ED hallway, suspect related to pain meds. Lumbar CT showed Grade 1 L4-5 anterolisthesis. Pt unable to tolerate MRI. S/P decompressive lumbar laminectomy T12-L3 5/5.PMH includes DM.     Clinical Impressions Pt admitted based on above, and was seen based on problem list below. PTA pt was living at home with her husband and was independent with ADLs and IADLs. Today pt is requiring set up  to mod assist for  ADLs. Bed mobility and functional transfers are  CGA for cueing. Pt presents with decreased awareness of limitations and difficulty problem solving, per husband this is close to pt's baseline Recommendation of >3 hours of skilled rehab daily. OT will continue to follow acutely to maximize functional independence.        If plan is discharge home, recommend the following:   A little help with walking and/or transfers;A little help with bathing/dressing/bathroom;Direct supervision/assist for medications management;Supervision due to cognitive status     Functional Status Assessment   Patient has had a recent decline in their functional status and demonstrates the ability to make significant improvements in function in a reasonable and predictable amount of time.     Equipment Recommendations   Other (comment) (Defer to next venue)     Recommendations for Other Services         Precautions/Restrictions   Precautions Precautions: Back;Fall Recall of Precautions/Restrictions: Impaired Restrictions Weight Bearing Restrictions Per Provider Order: No     Mobility Bed Mobility Overal bed mobility: Needs Assistance Bed Mobility: Rolling, Sidelying to  Sit Rolling: Contact guard assist Sidelying to sit: Contact guard assist       General bed mobility comments: Cues for sequencing no physical assist    Transfers Overall transfer level: Needs assistance Equipment used: Rolling walker (2 wheels) Transfers: Sit to/from Stand, Bed to chair/wheelchair/BSC Sit to Stand: Min assist Stand pivot transfers: Min assist   Step pivot transfers: Contact guard assist     General transfer comment: Increased cueing for safety with use of RW      Balance Overall balance assessment: Needs assistance Sitting-balance support: No upper extremity supported, Feet supported Sitting balance-Leahy Scale: Fair     Standing balance support: Bilateral upper extremity supported, During functional activity, Reliant on assistive device for balance Standing balance-Leahy Scale: Fair Standing balance comment: Reliant on RW           ADL either performed or assessed with clinical judgement   ADL Overall ADL's : Needs assistance/impaired Eating/Feeding: Set up;Sitting   Grooming: Set up;Sitting       Upper Body Dressing : Set up;Sitting   Lower Body Dressing: Moderate assistance;Sit to/from stand Lower Body Dressing Details (indicate cue type and reason): Pt unable to follow cues for full figure four for donning socks Toilet Transfer: Minimal assistance;Stand-pivot;BSC/3in1;Ambulation;Contact guard assist Toilet Transfer Details (indicate cue type and reason): SPT to [facility], ambulation from [facility] to chair Toileting- Clothing Manipulation and Hygiene: Minimal assistance;Sit to/from stand Toileting - Clothing Manipulation Details (indicate cue type and reason): Min assist to steady while standing & adhere to back precautions     Functional mobility during ADLs: Minimal assistance;Contact guard assist General ADL Comments: Increased cueing for compensatory techniques     Vision  Baseline Vision/History: 0 No visual deficits Vision Assessment?: No  apparent visual deficits            Pertinent Vitals/Pain Pain Assessment Pain Assessment: Faces Faces Pain Scale: Hurts even more Pain Location: Back & abdomen Pain Descriptors / Indicators: Discomfort, Grimacing, Guarding, Moaning Pain Intervention(s): Monitored during session, Repositioned     Extremity/Trunk Assessment Upper Extremity Assessment Upper Extremity Assessment: Overall WFL for tasks assessed   Lower Extremity Assessment Lower Extremity Assessment: Defer to PT evaluation   Cervical / Trunk Assessment Cervical / Trunk Assessment: Back Surgery   Communication Communication Communication: No apparent difficulties   Cognition Arousal: Alert Behavior During Therapy: WFL for tasks assessed/performed Cognition: Cognition impaired     Awareness: Intellectual awareness intact, Online awareness impaired   Attention impairment (select first level of impairment): Selective attention Executive functioning impairment (select all impairments): Problem solving, Organization OT - Cognition Comments: Per husband cog improved and near baseline, difficulty with problem solving and organization                 Following commands: Intact       Cueing  General Comments   Cueing Techniques: Verbal cues;Tactile cues  Rehab admissions as well we sitter and husband present for session           Home Living Family/patient expects to be discharged to:: Private residence Living Arrangements: Spouse/significant other Available Help at Discharge: Family;Available PRN/intermittently Type of Home: House Home Access: Stairs to enter;Level entry (Level entry for front foor) Entrance Stairs-Number of Steps: 1 Entrance Stairs-Rails: None Home Layout: One level     Bathroom Shower/Tub: Producer, television/film/video: Standard Bathroom Accessibility: Yes How Accessible: Accessible via wheelchair Home Equipment: Cane - single point;Shower seat;Grab bars -  tub/shower;Hand held shower head (Husbands cane)   Additional Comments: Husband works but is considering taking FMLA if needed      Prior Functioning/Environment Prior Level of Function : Independent/Modified Independent             Mobility Comments: No AD      OT Problem List: Decreased strength;Decreased range of motion;Decreased activity tolerance;Impaired balance (sitting and/or standing);Decreased safety awareness;Decreased knowledge of use of DME or AE;Decreased knowledge of precautions   OT Treatment/Interventions: Therapeutic exercise;Self-care/ADL training;DME and/or AE instruction;Energy conservation;Therapeutic activities;Patient/family education;Balance training      OT Goals(Current goals can be found in the care plan section)   Acute Rehab OT Goals Patient Stated Goal: To go to rehab OT Goal Formulation: With patient Time For Goal Achievement: 11/13/23 Potential to Achieve Goals: Good   OT Frequency:  Min 2X/week       AM-PAC OT "6 Clicks" Daily Activity     Outcome Measure Help from another person eating meals?: None Help from another person taking care of personal grooming?: A Little Help from another person toileting, which includes using toliet, bedpan, or urinal?: A Little Help from another person bathing (including washing, rinsing, drying)?: A Lot Help from another person to put on and taking off regular upper body clothing?: A Little Help from another person to put on and taking off regular lower body clothing?: A Lot 6 Click Score: 17   End of Session Equipment Utilized During Treatment: Gait belt;Rolling walker (2 wheels);Other (comment) [facility] Nurse Communication: Mobility status  Activity Tolerance: Patient tolerated treatment well Patient left: in chair;with call bell/phone within reach;with chair alarm set;with family/visitor present;with nursing/sitter in room  OT Visit Diagnosis: Unsteadiness on feet (R26.81);Other abnormalities of gait  and mobility (R26.89)                Time: 1610-9604 OT Time Calculation (min): 35 min Charges:  OT General Charges $OT Visit: 1 Visit OT Evaluation $OT Eval Moderate Complexity: 1 Mod OT Treatments $Self Care/Home Management : 8-22 mins  Kristina Long, OT  Acute Rehabilitation Services Office [phone] Secure chat preferred   Kristina Long 10/30/2023, 12:37 PM

## 2023-10-31 ENCOUNTER — Inpatient Hospital Stay (HOSPITAL_COMMUNITY)

## 2023-10-31 DIAGNOSIS — R7881 Bacteremia: Secondary | ICD-10-CM

## 2023-10-31 DIAGNOSIS — M5442 Lumbago with sciatica, left side: Secondary | ICD-10-CM | POA: Diagnosis not present

## 2023-10-31 DIAGNOSIS — G061 Intraspinal abscess and granuloma: Secondary | ICD-10-CM | POA: Diagnosis not present

## 2023-10-31 DIAGNOSIS — E119 Type 2 diabetes mellitus without complications: Secondary | ICD-10-CM | POA: Diagnosis not present

## 2023-10-31 DIAGNOSIS — M5441 Lumbago with sciatica, right side: Secondary | ICD-10-CM | POA: Diagnosis not present

## 2023-10-31 DIAGNOSIS — B953 Streptococcus pneumoniae as the cause of diseases classified elsewhere: Secondary | ICD-10-CM | POA: Diagnosis not present

## 2023-10-31 LAB — CBC
HCT: 32.8 % — ABNORMAL LOW (ref 36.0–46.0)
Hemoglobin: 10.7 g/dL — ABNORMAL LOW (ref 12.0–15.0)
MCH: 27.3 pg (ref 26.0–34.0)
MCHC: 32.6 g/dL (ref 30.0–36.0)
MCV: 83.7 fL (ref 80.0–100.0)
Platelets: 327 10*3/uL (ref 150–400)
RBC: 3.92 MIL/uL (ref 3.87–5.11)
RDW: 14.2 % (ref 11.5–15.5)
WBC: 21.1 10*3/uL — ABNORMAL HIGH (ref 4.0–10.5)
nRBC: 0 % (ref 0.0–0.2)

## 2023-10-31 LAB — LIPID PANEL
Cholesterol: 75 mg/dL (ref 0–200)
HDL: 26 mg/dL — ABNORMAL LOW (ref >40)
LDL Cholesterol: 37 mg/dL (ref 0–99)
Total CHOL/HDL Ratio: 2.9 ratio
Triglycerides: 62 mg/dL (ref <150)
VLDL: 12 mg/dL (ref 0–40)

## 2023-10-31 LAB — GLUCOSE, CAPILLARY
Glucose-Capillary: 207 mg/dL — ABNORMAL HIGH (ref 70–99)
Glucose-Capillary: 192 mg/dL — ABNORMAL HIGH (ref 70–99)
Glucose-Capillary: 191 mg/dL — ABNORMAL HIGH (ref 70–99)
Glucose-Capillary: 301 mg/dL — ABNORMAL HIGH (ref 70–99)
Glucose-Capillary: 321 mg/dL — ABNORMAL HIGH (ref 70–99)
Glucose-Capillary: 180 mg/dL — ABNORMAL HIGH (ref 70–99)

## 2023-10-31 LAB — ECHOCARDIOGRAM COMPLETE
AR max vel: 2.52 cm2
AV Area VTI: 2.99 cm2
AV Area mean vel: 2.25 cm2
AV Mean grad: 4 mmHg
AV Peak grad: 9 mmHg
Ao pk vel: 1.5 m/s
Area-P 1/2: 4.99 cm2
Calc EF: 56.3 %
Height: 70 in
S' Lateral: 3.5 cm
Single Plane A2C EF: 71.4 %
Single Plane A4C EF: 40.1 %
Weight: 1393.6 [oz_av]

## 2023-10-31 LAB — RENAL FUNCTION PANEL
Albumin: 2.3 g/dL — ABNORMAL LOW (ref 3.5–5.0)
Anion gap: 11 (ref 5–15)
BUN: 17 mg/dL (ref 8–23)
CO2: 28 mmol/L (ref 22–32)
Calcium: 9 mg/dL (ref 8.9–10.3)
Chloride: 100 mmol/L (ref 98–111)
Creatinine, Ser: 0.79 mg/dL (ref 0.44–1.00)
GFR, Estimated: >60 mL/min (ref >60)
Glucose, Bld: 190 mg/dL — ABNORMAL HIGH (ref 70–99)
Phosphorus: 2.5 mg/dL (ref 2.5–4.6)
Potassium: 4.1 mmol/L (ref 3.5–5.1)
Sodium: 139 mmol/L (ref 135–145)

## 2023-10-31 LAB — MAGNESIUM: Magnesium: 2.1 mg/dL (ref 1.7–2.4)

## 2023-10-31 MED ORDER — INSULIN ASPART 100 UNIT/ML IJ SOLN
0.0000 [IU] | Freq: Three times a day (TID) | INTRAMUSCULAR | Status: DC
  Administered 2023-10-31: 15 [IU] via SUBCUTANEOUS
  Administered 2023-11-01: 4 [IU] via SUBCUTANEOUS
  Administered 2023-11-01: 7 [IU] via SUBCUTANEOUS
  Administered 2023-11-01: 11 [IU] via SUBCUTANEOUS
  Administered 2023-11-02 (×2): 4 [IU] via SUBCUTANEOUS
  Administered 2023-11-02: 7 [IU] via SUBCUTANEOUS
  Administered 2023-11-03: 3 [IU] via SUBCUTANEOUS
  Administered 2023-11-03 – 2023-11-04 (×3): 7 [IU] via SUBCUTANEOUS
  Administered 2023-11-04: 3 [IU] via SUBCUTANEOUS
  Administered 2023-11-04: 11 [IU] via SUBCUTANEOUS
  Administered 2023-11-05 (×2): 3 [IU] via SUBCUTANEOUS
  Administered 2023-11-06: 7 [IU] via SUBCUTANEOUS
  Administered 2023-11-06: 3 [IU] via SUBCUTANEOUS
  Administered 2023-11-06: 4 [IU] via SUBCUTANEOUS
  Administered 2023-11-07: 3 [IU] via SUBCUTANEOUS
  Administered 2023-11-07: 7 [IU] via SUBCUTANEOUS

## 2023-10-31 MED ORDER — SODIUM CHLORIDE 0.9% FLUSH
3.0000 mL | Freq: Two times a day (BID) | INTRAVENOUS | Status: DC
  Administered 2023-10-31: 3 mL via INTRAVENOUS

## 2023-10-31 MED ORDER — SODIUM CHLORIDE 0.9% FLUSH: 3.0000 mL | INTRAVENOUS | Status: DC | PRN

## 2023-10-31 MED ORDER — INSULIN GLARGINE-YFGN 100 UNIT/ML ~~LOC~~ SOLN
30.0000 [IU] | Freq: Every day | SUBCUTANEOUS | Status: DC
  Administered 2023-10-31 – 2023-11-07 (×6): 30 [IU] via SUBCUTANEOUS
  Filled 2023-10-31 (×8): qty 0.3

## 2023-10-31 NOTE — Progress Notes (Signed)
 Pt alert and oriented x4, but confused. She can answer many questions correctly, but is experiencing delirium as well. Pt has been impulsive throughout the past 2 nights not following safety commands or allowing much assist. Safety sitter has been with patient, and she's responded better with having 1 on 1. Does not recommend patient on her own at this time due to safety concern and delirium. Charge nurse, Monalisa Angles made aware.

## 2023-10-31 NOTE — Progress Notes (Signed)
 [facility]  Date of Admission:  10/26/2023     Reason for Follow Up: Low back pain  Total days of antibiotics 3         ASSESSMENT:  Ms. Anglin's surgical specimens are growing Streptococcus pneumoniae which is concordant with her bacteremia with source of infection being epidural abscess status post evacuation with laminectomy.  Discussed plan of care to continue current dose of ceftriaxone  and will obtain TEE to rule out endocarditis given the likely chronicity of this infection.  Blood cultures from 10/30/2023 without growth to date and will continue to monitor until finalized.  Continue postoperative wound care per neurosurgery.  Standard/universal precautions.  Remaining medical and supportive care per internal medicine.  PLAN:  Continue current dose of ceftriaxone . Postoperative wound care per neurosurgery. TEE to rule out endocarditis. Monitor blood cultures for clearance of bacteremia. Standard/universal precautions. Remaining medical and supportive care per internal medicine.  Principal Problem:   Low back pain Active Problems:   Type 2 diabetes mellitus without complication, without long-term current use of insulin (HCC)   Dyslipidemia   Right flank pain   Acute encephalopathy   Epidural abscess   S/P lumbar laminectomy   Bacteremia due to Streptococcus pneumoniae    amLODipine  10 mg Oral Daily   bisacodyl  10 mg Rectal Daily   celecoxib  200 mg Oral Q12H   insulin aspart  0-15 Units Subcutaneous TID WC   insulin aspart  0-5 Units Subcutaneous QHS   insulin aspart  7 Units Subcutaneous TID WC   insulin glargine-yfgn  30 Units Subcutaneous Daily   senna-docusate  2 tablet Oral BID   sodium chloride  flush  3 mL Intravenous Q12H    SUBJECTIVE:  Afebrile overnight with no acute events.  Postsurgical soreness.  Tolerating antibiotics with no adverse side effects.  Allergies  Allergen Reactions   Blueberry [Vaccinium Angustifolium]  Swelling and Other (See Comments)    Lips swell     Review of Systems: Review of Systems  Constitutional:  Negative for chills, fever and weight loss.  Respiratory:  Negative for cough, shortness of breath and wheezing.   Cardiovascular:  Negative for chest pain and leg swelling.  Gastrointestinal:  Negative for abdominal pain, constipation, diarrhea, nausea and vomiting.  Skin:  Negative for rash.      OBJECTIVE: Vitals:   10/30/23 1937 10/31/23 0031 10/31/23 0448 10/31/23 0812  BP: (!) 136/57 (!) 144/59 (!) 153/62 (!) 143/73  Pulse: 98 91 94 98  Resp: 20 18 18 20   Temp: 98.6 F (37 C) 98.6 F (37 C) 98.4 F (36.9 C) 97.9 F (36.6 C)  TempSrc: Oral Oral Oral Oral  SpO2: 99% 99% 95% 100%  Weight:      Height:       Body mass index is 12.5 kg/m.  Physical Exam Constitutional:      General: She is not in acute distress.    Appearance: She is well-developed.     Comments: Lying in bed with head of bed elevated; pleasant  Cardiovascular:     Rate and Rhythm: Normal rate and regular rhythm.     Heart sounds: Normal heart sounds.  Pulmonary:     Effort: Pulmonary effort is normal.     Breath sounds: Normal breath sounds.  Skin:    General: Skin is warm and dry.  Neurological:     Mental Status: She is alert.     Lab Results Lab Results  Component  Value Date   WBC 21.1 (H) 10/31/2023   HGB 10.7 (L) 10/31/2023   HCT 32.8 (L) 10/31/2023   MCV 83.7 10/31/2023   PLT 327 10/31/2023    Lab Results  Component Value Date   CREATININE 0.79 10/31/2023   BUN 17 10/31/2023   NA 139 10/31/2023   K 4.1 10/31/2023   CL 100 10/31/2023   CO2 28 10/31/2023    Lab Results  Component Value Date   ALT 29 10/26/2023   AST 27 10/26/2023   ALKPHOS 89 10/26/2023   BILITOT 1.3 (H) 10/26/2023     Microbiology: Recent Results (from the past 240 hours)  Blood Culture (routine x 2)     Status: None   Collection Time: 10/24/23  3:12 PM   Specimen: BLOOD LEFT ARM  Result  Value Ref Range Status   Specimen Description   Final    BLOOD LEFT ARM Performed at [facility], [address] [place], [place] [address]    Special Requests   Final    BOTTLES DRAWN AEROBIC AND ANAEROBIC Blood Culture results may not be optimal due to an inadequate volume of blood received in culture bottles Performed at [facility], [address], [place], [place] [address]    Culture   Final    NO GROWTH 5 DAYS Performed at [facility], [address], [place], [place] [address]    Report Status 10/29/2023 FINAL  Final  Blood Culture (routine x 2)     Status: None   Collection Time: 10/24/23  3:17 PM   Specimen: BLOOD RIGHT ARM  Result Value Ref Range Status   Specimen Description   Final    BLOOD RIGHT ARM Performed at [facility], [address], [place], [place] [address]    Special Requests   Final    BOTTLES DRAWN AEROBIC AND ANAEROBIC Blood Culture results may not be optimal due to an inadequate volume of blood received in culture bottles Performed at [facility], [address] [place], [place] [address]    Culture   Final    NO GROWTH 5 DAYS Performed at [facility], [address] [place], [place] [address]    Report Status 10/29/2023 FINAL  Final  Resp panel by RT-PCR (RSV, Flu A&B, Covid) Anterior Nasal Swab     Status: None   Collection Time: 10/24/23  3:57 PM   Specimen: Anterior Nasal Swab  Result Value Ref Range Status   SARS Coronavirus 2 by RT PCR NEGATIVE NEGATIVE Final    Comment: (NOTE) SARS-CoV-2 target nucleic acids are NOT DETECTED.  The SARS-CoV-2 RNA is generally detectable in upper respiratory specimens during the acute phase of infection. The lowest concentration of SARS-CoV-2 viral copies this assay can detect is 138 copies/mL. A negative result does not preclude SARS-Cov-2 infection and should not be used as the sole basis for treatment or other patient management  decisions. A negative result may occur with  improper specimen collection/handling, submission of specimen other than nasopharyngeal swab, presence of viral mutation(s) within the areas targeted by this assay, and inadequate number of viral copies(<138 copies/mL). A negative result must be combined with clinical observations, patient history, and epidemiological information. The expected result is Negative.  Fact Sheet for Patients:  BloggerCourse.com  Fact Sheet for Healthcare Providers:  SeriousBroker.it  This test is no t yet approved or cleared by the [place]  FDA and  has been authorized  for detection and/or diagnosis of SARS-CoV-2 by FDA under an Emergency Use Authorization (EUA). This EUA will remain  in effect (meaning this test can be used) for the duration of the COVID-19 declaration under Section 564(b)(1) of the Act, 21 U.S.C.section 360bbb-3(b)(1), unless the authorization is terminated  or revoked sooner.       Influenza A by PCR NEGATIVE NEGATIVE Final   Influenza B by PCR NEGATIVE NEGATIVE Final    Comment: (NOTE) The Xpert Xpress SARS-CoV-2/FLU/RSV plus assay is intended as an aid in the diagnosis of influenza from Nasopharyngeal swab specimens and should not be used as a sole basis for treatment. Nasal washings and aspirates are unacceptable for Xpert Xpress SARS-CoV-2/FLU/RSV testing.  Fact Sheet for Patients: BloggerCourse.com  Fact Sheet for Healthcare Providers: SeriousBroker.it  This test is not yet approved or cleared by the [place]  FDA and has been authorized for detection and/or diagnosis of SARS-CoV-2 by FDA under an Emergency Use Authorization (EUA). This EUA will remain in effect (meaning this test can be used) for the duration of the COVID-19 declaration under Section 564(b)(1) of the Act, 21 U.S.C. section 360bbb-3(b)(1), unless the  authorization is terminated or revoked.     Resp Syncytial Virus by PCR NEGATIVE NEGATIVE Final    Comment: (NOTE) Fact Sheet for Patients: BloggerCourse.com  Fact Sheet for Healthcare Providers: SeriousBroker.it  This test is not yet approved or cleared by the [place]  FDA and has been authorized for detection and/or diagnosis of SARS-CoV-2 by FDA under an Emergency Use Authorization (EUA). This EUA will remain in effect (meaning this test can be used) for the duration of the COVID-19 declaration under Section 564(b)(1) of the Act, 21 U.S.C. section 360bbb-3(b)(1), unless the authorization is terminated or revoked.  Performed at [facility], [address] [place], [place] [address]   Surgical pcr screen     Status: None   Collection Time: 10/29/23 10:02 AM   Specimen: Nasal Mucosa; Nasal Swab  Result Value Ref Range Status   MRSA, PCR NEGATIVE NEGATIVE Final   Staphylococcus aureus NEGATIVE NEGATIVE Final    Comment: (NOTE) The Xpert SA Assay (FDA approved for NASAL specimens in patients 44 years of age and older), is one component of a comprehensive surveillance program. It is not intended to diagnose infection nor to guide or monitor treatment. Performed at [facility], [address] [place], [place] [address]   Culture, blood (Routine X 2) w Reflex to ID Panel     Status: None (Preliminary result)   Collection Time: 10/29/23 10:18 AM   Specimen: BLOOD RIGHT ARM  Result Value Ref Range Status   Specimen Description BLOOD RIGHT ARM  Final   Special Requests   Final    BOTTLES DRAWN AEROBIC AND ANAEROBIC Blood Culture adequate volume   Culture   Final    NO GROWTH 2 DAYS Performed at [facility], [address] [place], [place] [address]    Report Status PENDING  Incomplete  Culture, blood (Routine X 2) w Reflex to ID Panel     Status: Abnormal (Preliminary result)    Collection Time: 10/29/23 10:27 AM   Specimen: BLOOD LEFT ARM  Result Value Ref Range Status   Specimen Description BLOOD LEFT ARM  Final   Special Requests   Final    BOTTLES DRAWN AEROBIC AND ANAEROBIC BACTEROIDES CACCAE   Culture  Setup Time   Final    GRAM POSITIVE COCCI IN PAIRS IN CHAINS  ANAEROBIC BOTTLE ONLY CRITICAL RESULT CALLED TO, READ BACK BY AND VERIFIED WITH: PHARMD J LEDFORD 10/30/2023 @ 0440 BY AB    Culture (A)  Final    STREPTOCOCCUS PNEUMONIAE SUSCEPTIBILITIES TO FOLLOW Performed at [facility], [address] [place], [place] [address]    Report Status PENDING  Incomplete  Blood Culture ID Panel (Reflexed)     Status: Abnormal   Collection Time: 10/29/23 10:27 AM  Result Value Ref Range Status   Enterococcus faecalis NOT DETECTED NOT DETECTED Final   Enterococcus Faecium NOT DETECTED NOT DETECTED Final   Listeria monocytogenes NOT DETECTED NOT DETECTED Final   Staphylococcus species NOT DETECTED NOT DETECTED Final   Staphylococcus aureus (BCID) NOT DETECTED NOT DETECTED Final   Staphylococcus epidermidis NOT DETECTED NOT DETECTED Final   Staphylococcus lugdunensis NOT DETECTED NOT DETECTED Final   Streptococcus species DETECTED (A) NOT DETECTED Final    Comment: CRITICAL RESULT CALLED TO, READ BACK BY AND VERIFIED WITH: PHARMD J LEDFORD 10/30/2023 @ 0440 BY AB    Streptococcus agalactiae NOT DETECTED NOT DETECTED Final   Streptococcus pneumoniae DETECTED (A) NOT DETECTED Final    Comment: CRITICAL RESULT CALLED TO, READ BACK BY AND VERIFIED WITH: PHARMD J LEDFORD 10/30/2023 @ 0440 BY AB    Streptococcus pyogenes NOT DETECTED NOT DETECTED Final   A.calcoaceticus-baumannii NOT DETECTED NOT DETECTED Final   Bacteroides fragilis NOT DETECTED NOT DETECTED Final   Enterobacterales NOT DETECTED NOT DETECTED Final   Enterobacter cloacae complex NOT DETECTED NOT DETECTED Final   Escherichia coli NOT DETECTED NOT DETECTED Final   Klebsiella aerogenes NOT  DETECTED NOT DETECTED Final   Klebsiella oxytoca NOT DETECTED NOT DETECTED Final   Klebsiella pneumoniae NOT DETECTED NOT DETECTED Final   Proteus species NOT DETECTED NOT DETECTED Final   Salmonella species NOT DETECTED NOT DETECTED Final   Serratia marcescens NOT DETECTED NOT DETECTED Final   Haemophilus influenzae NOT DETECTED NOT DETECTED Final   Neisseria meningitidis NOT DETECTED NOT DETECTED Final   Pseudomonas aeruginosa NOT DETECTED NOT DETECTED Final   Stenotrophomonas maltophilia NOT DETECTED NOT DETECTED Final   Candida albicans NOT DETECTED NOT DETECTED Final   Candida auris NOT DETECTED NOT DETECTED Final   Candida glabrata NOT DETECTED NOT DETECTED Final   Candida krusei NOT DETECTED NOT DETECTED Final   Candida parapsilosis NOT DETECTED NOT DETECTED Final   Candida tropicalis NOT DETECTED NOT DETECTED Final   Cryptococcus neoformans/gattii NOT DETECTED NOT DETECTED Final    Comment: Performed at [facility], [address] [place], [place] [address]  Aerobic/Anaerobic Culture w Gram Stain (surgical/deep wound)     Status: None (Preliminary result)   Collection Time: 10/29/23  1:13 PM   Specimen: PATH Soft tissue  Result Value Ref Range Status   Specimen Description ABSCESS  Final   Special Requests LUMBAR ABSCESS NO 1  Final   Gram Stain   Final    FEW WBC PRESENT, PREDOMINANTLY PMN RARE GRAM POSITIVE COCCI IN PAIRS Performed at [facility], [address], [place], [place] [address]    Culture FEW STREPTOCOCCUS PNEUMONIAE  Final   Report Status PENDING  Incomplete  Aerobic/Anaerobic Culture w Gram Stain (surgical/deep wound)     Status: None (Preliminary result)   Collection Time: 10/29/23  1:23 PM   Specimen: PATH Soft tissue  Result Value Ref Range Status   Specimen Description ABSCESS  Final   Special Requests LUMBAR ABSCESS NO 2  Final  Gram Stain   Final    ABUNDANT WBC PRESENT, PREDOMINANTLY PMN FEW GRAM POSITIVE COCCI IN PAIRS     Culture   Final    MODERATE STREPTOCOCCUS PNEUMONIAE SUSCEPTIBILITIES TO FOLLOW Performed at [facility], [address], [place], [place] [address]    Report Status PENDING  Incomplete  Aerobic/Anaerobic Culture w Gram Stain (surgical/deep wound)     Status: None (Preliminary result)   Collection Time: 10/29/23  1:30 PM   Specimen: PATH Soft tissue  Result Value Ref Range Status   Specimen Description ABSCESS  Final   Special Requests LUMBAR ABSCESS NO 3  Final   Gram Stain   Final    RARE WBC PRESENT, PREDOMINANTLY PMN RARE GRAM POSITIVE COCCI IN PAIRS    Culture   Final    CULTURE REINCUBATED FOR BETTER GROWTH Performed at [facility], [address], [place], [place] [address]    Report Status PENDING  Incomplete  Culture, blood (Routine X 2) w Reflex to ID Panel     Status: None (Preliminary result)   Collection Time: 10/30/23  5:48 PM   Specimen: BLOOD LEFT ARM  Result Value Ref Range Status   Specimen Description BLOOD LEFT ARM  Final   Special Requests   Final    BOTTLES DRAWN AEROBIC AND ANAEROBIC Blood Culture adequate volume   Culture   Final    NO GROWTH < 12 HOURS Performed at [facility], [address] [place], [place] [address]    Report Status PENDING  Incomplete  Culture, blood (Routine X 2) w Reflex to ID Panel     Status: None (Preliminary result)   Collection Time: 10/30/23  6:07 PM   Specimen: BLOOD LEFT ARM  Result Value Ref Range Status   Specimen Description BLOOD LEFT ARM  Final   Special Requests   Final    AEROBIC BOTTLE ONLY Blood Culture results may not be optimal due to an inadequate volume of blood received in culture bottles   Culture   Final    NO GROWTH < 12 HOURS Performed at [facility], [address], [place], [place] [address]    Report Status PENDING  Incomplete     Marlan Silva, NP [facility]  10/31/2023  11:36 AM

## 2023-10-31 NOTE — Progress Notes (Signed)
 Inpatient Rehab Admissions Coordinator:    CIR following. Note confusion overnight but Pt. Is now alert and oriented. I will send case to insurance.   Wandalee Gust, MS, CCC-SLP Rehab Admissions Coordinator  [phone] (celll) [phone] (office)

## 2023-10-31 NOTE — H&P (View-Only) (Signed)
 PROGRESS NOTE  Kristina Long ZOX:[id number] DOB: [birth year]   PCP: Arlys Berke, MD  Patient is from: Home.  Lives with husband.  Independently ambulates at baseline.  DOA: 10/26/2023 LOS: 4  Chief complaints Chief Complaint  Patient presents with   Drug Overdose     Brief Narrative / Interim history: 68 year old F with PMH of DM-2 returning to ED for the third time in a week with right back and flank pain.  [name]  She said she had extensive evaluation by different experts outpatient at that time without significant finding.  Eventually pain resolved on its own.  [name], and started feeling similar pain.  [name]  At that time, WBC 19.7 with left shift.  CMP, lipase, UA and CT abdomen and pelvis without significant finding.  Symptoms improved with pain medication and [name]   Patient returned to ED on 4/30 with the same complaints.  This time, she was febrile to 100.5.  WBC 18.4 with left shift.  CT renal stone study with grade 1 L4-L5 anterolisthesis.  Blood cultures obtained.  [name]  Patient returns to ED on 5/2 with right low back pain radiating to right groin and altered mental status.  She accidentally took 8 of the 200 mg ibuprofen, 2 oxycodone  and her muscle relaxers due to pain.    In ED, slightly tachycardic, tachypneic and hypertensive.  WBC 16.5 with left shift.  CMP with hyperglycemia.  UA with moderate Hgb, proteinuria and glucosuria.  Reportedly, she was in severe pain while in ED. MRI was ordered but delayed due to altered mental status.  She was admitted and transferred to [facility] for MRI under anesthesia.   Patient's encephalopathy resolved.  MRI thoracic/lumbar  spine concerning for epidural abscess extending from T11-L5 with resultant severe canal stenosis at L1-L2 and L2-L3 and moderate canal stenosis at L3-L4.    Patient underwent decompressive lumbar laminectomy, [name]  Blood and abscess culture with Streptococcus pneumonia.  Antibiotic de-escalated to ceftriaxone  per ID.  Repeat blood cultures NGTD.  TTE and TEE pending.   Subjective: Seen and examined earlier this morning.  Some confusion overnight.  Sitting on bedside chair.  Oriented x 4.  Significant pain especially with movement.  Denies radiation to his leg.  Denies numbness or tingling.  Has had 2 good bowel movements.  Denies bladder habit change.  Objective: Vitals:   10/30/23 1937 10/31/23 0031 10/31/23 0448 10/31/23 0812  BP: (!) 136/57 (!) 144/59 (!) 153/62 (!) 143/73  Pulse: 98 91 94 98  Resp: 20 18 18 20   Temp: 98.6 F (37 C) 98.6 F (37 C) 98.4 F (36.9 C) 97.9 F (36.6 C)  TempSrc: Oral Oral Oral Oral  SpO2: 99% 99% 95% 100%  Weight:      Height:        Examination:  GENERAL: No apparent distress.  Nontoxic. HEENT: MMM.  Vision and hearing grossly intact.  NECK: Supple.  No apparent JVD.  RESP:  No IWOB.  Fair aeration bilaterally. CVS:  RRR. Heart sounds normal.  ABD/GI/GU: BS+. Abd soft, NTND.  MSK/EXT: Fair strength in  both legs.  Symmetric.  Steri-Strip over surgical site. SKIN: Surgical wound DCI. NEURO: Awake, alert and x 4..  No apparent focal neuro deficit. PSYCH: Calm. Normal affect.   Consultants:  None  Procedures: 5/5-decompressive lumbar laminectomy, medial facetectomy and foraminostomies from T12-L3 with evacuation of epidural abscess by Dr. Rochelle Chu   Microbiology summarized: 4/30-blood cultures NGTD 5/5-blood culture with Streptococcus pneumoniae 5/5-abscess culture with Streptococcus pneumoniae 5/6-repeat blood cultures NGTD  Assessment and  plan: Epidural abscess with severe canal stenosis Streptococcus pneumoniae bacteremia Severe lower back due to the above -S/p decompressive lumbar laminectomy, medial facetectomy and foraminostomies from T12-L3 with evacuation of epidural abscess by Dr. Rochelle Chu on 5/5 -Presents with lower back pain radiating to right groin for days. -CRP and ESR markedly elevated. -Follow TTE and TEE. -Pain and glycemic control -Bowel regimen to counteract constipation -PT/OT-, and CIR.  CIR following. - Appreciate help by neurosurgery and ID  Acute encephalopathy/delirium: Some confusion overnight.  Well-oriented this morning. -Minimize sedating medications -Reorientation and delirium precaution -Needs safety sitter intermittently.  Uncontrolled NIDDM-2 with hyperglycemia: A1c 8.7%.  On metformin  and Jardiance  at home Recent Labs  Lab 10/30/23 2135 10/31/23 0559 10/31/23 0815 10/31/23 1059 10/31/23 1333  GLUCAP 164* 207* 192* 191* 301*  -Change [name] to resistant -Increase Semglee from 25 to 30 units daily -Continue NovoLog 7 units 3 times daily with meals -Further adjustment as appropriate  Proteinuria -Continue home lisinopril  Leukocytosis/bandemia: Likely due to the above.  Slightly worse today. - Antibiotics per ID  Hyponatremia: Mild - Continue monitoring  Body mass index is 12.5 kg/m. -Will obtain weight and height for accurate BMI           DVT prophylaxis:  SCD's Start: 10/29/23 [id number] for now.  Code Status: Full code Family Communication: None at bedside today Level of care: Med-Surg Status is: Inpatient Remains inpatient appropriate because: Epidural abscess and bacteremia   Final disposition: CIR?   55 minutes with more than 50% spent in reviewing records, counseling patient/family and coordinating care.   Sch Meds:  Scheduled Meds:  amLODipine  10 mg Oral Daily   bisacodyl  10 mg Rectal Daily   celecoxib  200 mg Oral Q12H   insulin aspart  0-15 Units  Subcutaneous TID WC   insulin aspart  0-5 Units Subcutaneous QHS   insulin aspart  7 Units Subcutaneous TID WC   insulin glargine-yfgn  30 Units Subcutaneous Daily   senna-docusate  2 tablet Oral BID   sodium chloride  flush  3 mL Intravenous Q12H   Continuous Infusions:  cefTRIAXone  (ROCEPHIN )  IV 2 g (10/31/23 0520)   PRN Meds:.haloperidol lactate, HYDROmorphone  (DILAUDID ) injection, labetalol, loratadine, magnesium citrate, menthol-cetylpyridinium **OR** phenol, methocarbamol  **OR** methocarbamol  (ROBAXIN ) injection, ondansetron  **OR** ondansetron  (ZOFRAN ) IV, mouth rinse, oxyCODONE , [COMPLETED] polyethylene glycol **FOLLOWED BY** polyethylene glycol, sodium chloride  flush  Antimicrobials: Anti-infectives (From admission, onward)    Start     Dose/Rate Route Frequency Ordered Stop   10/31/23 0600  cefTRIAXone  (ROCEPHIN ) 2 g in sodium chloride  0.9 % 100 mL IVPB        2 g 200 mL/hr over 30 Minutes Intravenous Every 24 hours 10/30/23 1012     10/30/23 1000  vancomycin (VANCOREADY) IVPB 500 mg/100 mL  Status:  Discontinued        500 mg 100 mL/hr over 60 Minutes Intravenous Every 24 hours 10/29/23 1915 10/30/23 1012   10/30/23 1000  cefTRIAXone  (ROCEPHIN ) 2 g in sodium chloride  0.9 % 100 mL IVPB  Status:  Discontinued        2 g 200 mL/hr over 30 Minutes Intravenous Every 12 hours 10/29/23 2202 10/29/23 2203   10/30/23 0600  cefTRIAXone  (ROCEPHIN ) 2 g in sodium chloride  0.9 % 100 mL IVPB  Status:  Discontinued        2 g 200 mL/hr over 30 Minutes Intravenous Every 12 hours 10/30/23 0457 10/30/23 1012   10/29/23 2300  metroNIDAZOLE (FLAGYL) IVPB 500 mg  Status:  Discontinued        500 mg 100 mL/hr over 60 Minutes Intravenous Every 12 hours 10/29/23 2202 10/30/23 0457   10/29/23 2300  ceFEPIme (MAXIPIME) 2 g in sodium chloride  0.9 % 100 mL IVPB  Status:  Discontinued        2 g 200 mL/hr over 30 Minutes Intravenous Every 12 hours 10/29/23 2209 10/30/23 0457   10/29/23 2200   cefTRIAXone  (ROCEPHIN ) 1 g in sodium chloride  0.9 % 100 mL IVPB  Status:  Discontinued        1 g 200 mL/hr over 30 Minutes Intravenous Every 12 hours 10/29/23 1855 10/29/23 2202   10/29/23 1424  vancomycin (VANCOCIN) powder  Status:  Discontinued          As needed 10/29/23 1424 10/29/23 1454   10/29/23 1130  cefTRIAXone  (ROCEPHIN ) 2 g in sodium chloride  0.9 % 100 mL IVPB        2 g 200 mL/hr over 30 Minutes Intravenous  Once 10/29/23 1115 10/29/23 1255   10/29/23 1130  vancomycin (VANCOCIN) IVPB 1000 mg/200 mL premix        1,000 mg 200 mL/hr over 60 Minutes Intravenous  Once 10/29/23 1115 10/29/23 1230   10/29/23 1119  sodium chloride  0.9 % with cefTRIAXone  (ROCEPHIN ) ADS Med       Note to Pharmacy: Franceen Inches: cabinet override      10/29/23 1119 10/29/23 1310   10/29/23 1119  vancomycin (VANCOCIN) 1-5 GM/200ML-% IVPB       Note to Pharmacy: Franceen Inches: cabinet override      10/29/23 1119 10/29/23 1241        I have personally reviewed the following labs and images: CBC: Recent Labs  Lab 10/24/23 1517 10/26/23 2025 10/28/23 0506 10/29/23 0658 10/30/23 0621 10/31/23 0534  WBC 18.4* 16.5* 21.3* 18.5* 17.7* 21.1*  NEUTROABS 15.8* 13.3*  --   --   --   --   HGB 11.7* 11.1* 11.9* 11.6* 11.3* 10.7*  HCT 37.7 34.4* 37.4 35.0* 34.1* 32.8*  MCV 87.3 84.9 84.4 82.9 82.8 83.7  PLT 305 289 328 342 365 327   BMP &GFR Recent Labs  Lab 10/26/23 2025 10/28/23 0506 10/29/23 0658 10/30/23 0621 10/31/23 0534  NA 135 136 134* 134* 139  K 4.1 3.7 3.6 3.7 4.1  CL 99 98 97* 95* 100  CO2 23 22 25 23 28   GLUCOSE 310* 357* 320* 303* 190*  BUN 17 21 31* 23 17  CREATININE 0.59 0.87 0.75 0.81 0.79  CALCIUM  9.1 9.5 9.4 9.1 9.0  MG  --   --  2.1 1.9 2.1  PHOS  --   --  3.4 2.4* 2.5   Estimated Creatinine Clearance: 41.4 mL/min (by C-G formula based on SCr of 0.79 mg/dL). Liver & Pancreas: Recent Labs  Lab 10/24/23 1517 10/26/23 2025 10/29/23 0658 10/30/23 0621  10/31/23 0534  AST 25 27  --   --   --   ALT 30 29  --   --   --  ALKPHOS 93 89  --   --   --   BILITOT 0.8 1.3*  --   --   --   PROT 8.6* 8.3*  --   --   --   ALBUMIN 3.4* 3.0* 2.4* 2.4* 2.3*   No results for input(s): "LIPASE", "AMYLASE" in the last 168 hours.  No results for input(s): "AMMONIA" in the last 168 hours. Diabetic: No results for input(s): "HGBA1C" in the last 72 hours.  Recent Labs  Lab 10/30/23 2135 10/31/23 0559 10/31/23 0815 10/31/23 1059 10/31/23 1333  GLUCAP 164* 207* 192* 191* 301*   Cardiac Enzymes: No results for input(s): "CKTOTAL", "CKMB", "CKMBINDEX", "TROPONINI" in the last 168 hours. No results for input(s): "PROBNP" in the last 8760 hours. Coagulation Profile: Recent Labs  Lab 10/24/23 1517  INR 1.2   Thyroid  Function Tests: No results for input(s): "TSH", "T4TOTAL", "FREET4", "T3FREE", "THYROIDAB" in the last 72 hours. Lipid Profile: Recent Labs    10/31/23 0534  CHOL 75  HDL 26*  LDLCALC 37  TRIG 62  CHOLHDL 2.9   Anemia Panel: No results for input(s): "VITAMINB12", "FOLATE", "FERRITIN", "TIBC", "IRON", "RETICCTPCT" in the last 72 hours. Urine analysis:    Component Value Date/Time   COLORURINE YELLOW 10/26/2023 2027   APPEARANCEUR CLEAR 10/26/2023 2027   LABSPEC 1.031 (H) 10/26/2023 2027   PHURINE 5.0 10/26/2023 2027   GLUCOSEU >=500 (A) 10/26/2023 2027   HGBUR MODERATE (A) 10/26/2023 2027   BILIRUBINUR NEGATIVE 10/26/2023 2027   KETONESUR 20 (A) 10/26/2023 2027   PROTEINUR >=300 (A) 10/26/2023 2027   NITRITE NEGATIVE 10/26/2023 2027   LEUKOCYTESUR NEGATIVE 10/26/2023 2027   Sepsis Labs: Invalid input(s): "PROCALCITONIN", "LACTICIDVEN"  Microbiology: Recent Results (from the past 240 hours)  Blood Culture (routine x 2)     Status: None   Collection Time: 10/24/23  3:12 PM   Specimen: BLOOD LEFT ARM  Result Value Ref Range Status   Specimen Description   Final    BLOOD LEFT ARM Performed at [facility], [address], [place], [place] [address]    Special Requests   Final    BOTTLES DRAWN AEROBIC AND ANAEROBIC Blood Culture results may not be optimal due to an inadequate volume of blood received in culture bottles Performed at [facility], [address], [place], [place] [address]    Culture   Final    NO GROWTH 5 DAYS Performed at [facility], [address] [place], [place] [address]    Report Status 10/29/2023 FINAL  Final  Blood Culture (routine x 2)     Status: None   Collection Time: 10/24/23  3:17 PM   Specimen: BLOOD RIGHT ARM  Result Value Ref Range Status   Specimen Description   Final    BLOOD RIGHT ARM Performed at [facility], [address] [place], [place] [address]    Special Requests   Final    BOTTLES DRAWN AEROBIC AND ANAEROBIC Blood Culture results may not be optimal due to an inadequate volume of blood received in culture bottles Performed at [facility], [address] [place], [place] [address]    Culture   Final    NO GROWTH 5 DAYS Performed at [facility], [address], [place], [place] [address]    Report Status 10/29/2023 FINAL  Final  Resp panel by RT-PCR (RSV, Flu A&B, Covid) Anterior Nasal Swab     Status: None  Collection Time: 10/24/23  3:57 PM   Specimen: Anterior Nasal Swab  Result Value Ref Range Status   SARS Coronavirus 2 by RT PCR NEGATIVE NEGATIVE Final    Comment: (NOTE) SARS-CoV-2 target nucleic acids are NOT DETECTED.  The SARS-CoV-2 RNA is generally detectable in upper respiratory specimens during the acute phase of infection. The lowest concentration of SARS-CoV-2 viral copies this assay can detect is 138 copies/mL. A negative result does not preclude SARS-Cov-2 infection and should not be used as the sole basis for treatment or other patient management decisions. A negative result may occur with  improper specimen collection/handling, submission of specimen  other than nasopharyngeal swab, presence of viral mutation(s) within the areas targeted by this assay, and inadequate number of viral copies(<138 copies/mL). A negative result must be combined with clinical observations, patient history, and epidemiological information. The expected result is Negative.  Fact Sheet for Patients:  BloggerCourse.com  Fact Sheet for Healthcare Providers:  SeriousBroker.it  This test is no t yet approved or cleared by the [place]  FDA and  has been authorized for detection and/or diagnosis of SARS-CoV-2 by FDA under an Emergency Use Authorization (EUA). This EUA will remain  in effect (meaning this test can be used) for the duration of the COVID-19 declaration under Section 564(b)(1) of the Act, 21 U.S.C.section 360bbb-3(b)(1), unless the authorization is terminated  or revoked sooner.       Influenza A by PCR NEGATIVE NEGATIVE Final   Influenza B by PCR NEGATIVE NEGATIVE Final    Comment: (NOTE) The Xpert Xpress SARS-CoV-2/FLU/RSV plus assay is intended as an aid in the diagnosis of influenza from Nasopharyngeal swab specimens and should not be used as a sole basis for treatment. Nasal washings and aspirates are unacceptable for Xpert Xpress SARS-CoV-2/FLU/RSV testing.  Fact Sheet for Patients: BloggerCourse.com  Fact Sheet for Healthcare Providers: SeriousBroker.it  This test is not yet approved or cleared by the [place]  FDA and has been authorized for detection and/or diagnosis of SARS-CoV-2 by FDA under an Emergency Use Authorization (EUA). This EUA will remain in effect (meaning this test can be used) for the duration of the COVID-19 declaration under Section 564(b)(1) of the Act, 21 U.S.C. section 360bbb-3(b)(1), unless the authorization is terminated or revoked.     Resp Syncytial Virus by PCR NEGATIVE NEGATIVE Final     Comment: (NOTE) Fact Sheet for Patients: BloggerCourse.com  Fact Sheet for Healthcare Providers: SeriousBroker.it  This test is not yet approved or cleared by the [place]  FDA and has been authorized for detection and/or diagnosis of SARS-CoV-2 by FDA under an Emergency Use Authorization (EUA). This EUA will remain in effect (meaning this test can be used) for the duration of the COVID-19 declaration under Section 564(b)(1) of the Act, 21 U.S.C. section 360bbb-3(b)(1), unless the authorization is terminated or revoked.  Performed at [facility], [address] [place], [place] [address]   Surgical pcr screen     Status: None   Collection Time: 10/29/23 10:02 AM   Specimen: Nasal Mucosa; Nasal Swab  Result Value Ref Range Status   MRSA, PCR NEGATIVE NEGATIVE Final   Staphylococcus aureus NEGATIVE NEGATIVE Final    Comment: (NOTE) The Xpert SA Assay (FDA approved for NASAL specimens in patients 71 years of age and older), is one component of a comprehensive surveillance program. It is not intended to diagnose infection nor to guide or monitor treatment. Performed at [facility], [address], [place], [place]  [address]   Culture, blood (Routine X 2) w Reflex to ID Panel     Status: None (Preliminary result)   Collection Time: 10/29/23 10:18 AM   Specimen: BLOOD RIGHT ARM  Result Value Ref Range Status   Specimen Description BLOOD RIGHT ARM  Final   Special Requests   Final    BOTTLES DRAWN AEROBIC AND ANAEROBIC Blood Culture adequate volume   Culture   Final    NO GROWTH 2 DAYS Performed at [facility], [address] [place], [place] [address]    Report Status PENDING  Incomplete  Culture, blood (Routine X 2) w Reflex to ID Panel     Status: Abnormal (Preliminary result)   Collection Time: 10/29/23 10:27 AM   Specimen: BLOOD LEFT ARM  Result Value Ref Range Status   Specimen  Description BLOOD LEFT ARM  Final   Special Requests   Final    BOTTLES DRAWN AEROBIC AND ANAEROBIC BACTEROIDES CACCAE   Culture  Setup Time   Final    GRAM POSITIVE COCCI IN PAIRS IN CHAINS ANAEROBIC BOTTLE ONLY CRITICAL RESULT CALLED TO, READ BACK BY AND VERIFIED WITH: PHARMD J LEDFORD 10/30/2023 @ 0440 BY AB    Culture (A)  Final    STREPTOCOCCUS PNEUMONIAE SUSCEPTIBILITIES TO FOLLOW Performed at [facility], [address] [place], [place] [address]    Report Status PENDING  Incomplete  Blood Culture ID Panel (Reflexed)     Status: Abnormal   Collection Time: 10/29/23 10:27 AM  Result Value Ref Range Status   Enterococcus faecalis NOT DETECTED NOT DETECTED Final   Enterococcus Faecium NOT DETECTED NOT DETECTED Final   Listeria monocytogenes NOT DETECTED NOT DETECTED Final   Staphylococcus species NOT DETECTED NOT DETECTED Final   Staphylococcus aureus (BCID) NOT DETECTED NOT DETECTED Final   Staphylococcus epidermidis NOT DETECTED NOT DETECTED Final   Staphylococcus lugdunensis NOT DETECTED NOT DETECTED Final   Streptococcus species DETECTED (A) NOT DETECTED Final    Comment: CRITICAL RESULT CALLED TO, READ BACK BY AND VERIFIED WITH: PHARMD J LEDFORD 10/30/2023 @ 0440 BY AB    Streptococcus agalactiae NOT DETECTED NOT DETECTED Final   Streptococcus pneumoniae DETECTED (A) NOT DETECTED Final    Comment: CRITICAL RESULT CALLED TO, READ BACK BY AND VERIFIED WITH: PHARMD J LEDFORD 10/30/2023 @ 0440 BY AB    Streptococcus pyogenes NOT DETECTED NOT DETECTED Final   A.calcoaceticus-baumannii NOT DETECTED NOT DETECTED Final   Bacteroides fragilis NOT DETECTED NOT DETECTED Final   Enterobacterales NOT DETECTED NOT DETECTED Final   Enterobacter cloacae complex NOT DETECTED NOT DETECTED Final   Escherichia coli NOT DETECTED NOT DETECTED Final   Klebsiella aerogenes NOT DETECTED NOT DETECTED Final   Klebsiella oxytoca NOT DETECTED NOT DETECTED Final   Klebsiella pneumoniae NOT  DETECTED NOT DETECTED Final   Proteus species NOT DETECTED NOT DETECTED Final   Salmonella species NOT DETECTED NOT DETECTED Final   Serratia marcescens NOT DETECTED NOT DETECTED Final   Haemophilus influenzae NOT DETECTED NOT DETECTED Final   Neisseria meningitidis NOT DETECTED NOT DETECTED Final   Pseudomonas aeruginosa NOT DETECTED NOT DETECTED Final   Stenotrophomonas maltophilia NOT DETECTED NOT DETECTED Final   Candida albicans NOT DETECTED NOT DETECTED Final   Candida auris NOT DETECTED NOT DETECTED Final   Candida glabrata NOT DETECTED NOT DETECTED Final   Candida krusei NOT DETECTED NOT DETECTED Final   Candida parapsilosis NOT DETECTED NOT DETECTED Final   Candida tropicalis NOT DETECTED  NOT DETECTED Final   Cryptococcus neoformans/gattii NOT DETECTED NOT DETECTED Final    Comment: Performed at [facility], [address], [place], [place] [address]  Aerobic/Anaerobic Culture w Gram Stain (surgical/deep wound)     Status: None (Preliminary result)   Collection Time: 10/29/23  1:13 PM   Specimen: PATH Soft tissue  Result Value Ref Range Status   Specimen Description ABSCESS  Final   Special Requests LUMBAR ABSCESS NO 1  Final   Gram Stain   Final    FEW WBC PRESENT, PREDOMINANTLY PMN RARE GRAM POSITIVE COCCI IN PAIRS Performed at [facility], [address], [place], [place] [address]    Culture   Final    FEW STREPTOCOCCUS PNEUMONIAE NO ANAEROBES ISOLATED; CULTURE IN PROGRESS FOR 5 DAYS    Report Status PENDING  Incomplete  Aerobic/Anaerobic Culture w Gram Stain (surgical/deep wound)     Status: None (Preliminary result)   Collection Time: 10/29/23  1:23 PM   Specimen: PATH Soft tissue  Result Value Ref Range Status   Specimen Description ABSCESS  Final   Special Requests LUMBAR ABSCESS NO 2  Final   Gram Stain   Final    ABUNDANT WBC PRESENT, PREDOMINANTLY PMN FEW GRAM POSITIVE COCCI IN PAIRS Performed at [facility], [address]  [place], [place] [address]    Culture   Final    MODERATE STREPTOCOCCUS PNEUMONIAE SUSCEPTIBILITIES TO FOLLOW NO ANAEROBES ISOLATED; CULTURE IN PROGRESS FOR 5 DAYS    Report Status PENDING  Incomplete  Aerobic/Anaerobic Culture w Gram Stain (surgical/deep wound)     Status: None (Preliminary result)   Collection Time: 10/29/23  1:30 PM   Specimen: PATH Soft tissue  Result Value Ref Range Status   Specimen Description ABSCESS  Final   Special Requests LUMBAR ABSCESS NO 3  Final   Gram Stain   Final    RARE WBC PRESENT, PREDOMINANTLY PMN RARE GRAM POSITIVE COCCI IN PAIRS Performed at [facility], [address], [place], [place] [address]    Culture   Final    RARE STREPTOCOCCUS PNEUMONIAE NO ANAEROBES ISOLATED; CULTURE IN PROGRESS FOR 5 DAYS    Report Status PENDING  Incomplete  Culture, blood (Routine X 2) w Reflex to ID Panel     Status: None (Preliminary result)   Collection Time: 10/30/23  5:48 PM   Specimen: BLOOD LEFT ARM  Result Value Ref Range Status   Specimen Description BLOOD LEFT ARM  Final   Special Requests   Final    BOTTLES DRAWN AEROBIC AND ANAEROBIC Blood Culture adequate volume   Culture   Final    NO GROWTH < 12 HOURS Performed at [facility], [address], [place], [place] [address]    Report Status PENDING  Incomplete  Culture, blood (Routine X 2) w Reflex to ID Panel     Status: None (Preliminary result)   Collection Time: 10/30/23  6:07 PM   Specimen: BLOOD LEFT ARM  Result Value Ref Range Status   Specimen Description BLOOD LEFT ARM  Final   Special Requests   Final    AEROBIC BOTTLE ONLY Blood Culture results may not be optimal due to an inadequate volume of blood received in culture bottles   Culture   Final    NO GROWTH < 12 HOURS Performed at [facility], [address] [place], [place] [address]    Report Status PENDING  Incomplete  Radiology Studies: No results found.     Gerardo Caiazzo T. Daziyah Cogan [facility]  If  7PM-7AM, please contact night-coverage [email or web address] 10/31/2023, 2:06 PM

## 2023-10-31 NOTE — Progress Notes (Signed)
 PROGRESS NOTE  Kristina Long ZOX:[id number] DOB: [birth year]   PCP: Arlys Berke, MD  Patient is from: Home.  Lives with husband.  Independently ambulates at baseline.  DOA: 10/26/2023 LOS: 4  Chief complaints Chief Complaint  Patient presents with   Drug Overdose     Brief Narrative / Interim history: 68 year old F with PMH of DM-2 returning to ED for the third time in a week with right back and flank pain.  [name]  She said she had extensive evaluation by different experts outpatient at that time without significant finding.  Eventually pain resolved on its own.  [name], and started feeling similar pain.  [name]  At that time, WBC 19.7 with left shift.  CMP, lipase, UA and CT abdomen and pelvis without significant finding.  Symptoms improved with pain medication and [name]   Patient returned to ED on 4/30 with the same complaints.  This time, she was febrile to 100.5.  WBC 18.4 with left shift.  CT renal stone study with grade 1 L4-L5 anterolisthesis.  Blood cultures obtained.  [name]  Patient returns to ED on 5/2 with right low back pain radiating to right groin and altered mental status.  She accidentally took 8 of the 200 mg ibuprofen, 2 oxycodone  and her muscle relaxers due to pain.    In ED, slightly tachycardic, tachypneic and hypertensive.  WBC 16.5 with left shift.  CMP with hyperglycemia.  UA with moderate Hgb, proteinuria and glucosuria.  Reportedly, she was in severe pain while in ED. MRI was ordered but delayed due to altered mental status.  She was admitted and transferred to [facility] for MRI under anesthesia.   Patient's encephalopathy resolved.  MRI thoracic/lumbar  spine concerning for epidural abscess extending from T11-L5 with resultant severe canal stenosis at L1-L2 and L2-L3 and moderate canal stenosis at L3-L4.    Patient underwent decompressive lumbar laminectomy, [name]  Blood and abscess culture with Streptococcus pneumonia.  Antibiotic de-escalated to ceftriaxone  per ID.  Repeat blood cultures NGTD.  TTE and TEE pending.   Subjective: Seen and examined earlier this morning.  Some confusion overnight.  Sitting on bedside chair.  Oriented x 4.  Significant pain especially with movement.  Denies radiation to his leg.  Denies numbness or tingling.  Has had 2 good bowel movements.  Denies bladder habit change.  Objective: Vitals:   10/30/23 1937 10/31/23 0031 10/31/23 0448 10/31/23 0812  BP: (!) 136/57 (!) 144/59 (!) 153/62 (!) 143/73  Pulse: 98 91 94 98  Resp: 20 18 18 20   Temp: 98.6 F (37 C) 98.6 F (37 C) 98.4 F (36.9 C) 97.9 F (36.6 C)  TempSrc: Oral Oral Oral Oral  SpO2: 99% 99% 95% 100%  Weight:      Height:        Examination:  GENERAL: No apparent distress.  Nontoxic. HEENT: MMM.  Vision and hearing grossly intact.  NECK: Supple.  No apparent JVD.  RESP:  No IWOB.  Fair aeration bilaterally. CVS:  RRR. Heart sounds normal.  ABD/GI/GU: BS+. Abd soft, NTND.  MSK/EXT: Fair strength in  both legs.  Symmetric.  Steri-Strip over surgical site. SKIN: Surgical wound DCI. NEURO: Awake, alert and x 4..  No apparent focal neuro deficit. PSYCH: Calm. Normal affect.   Consultants:  None  Procedures: 5/5-decompressive lumbar laminectomy, medial facetectomy and foraminostomies from T12-L3 with evacuation of epidural abscess by Dr. Rochelle Chu   Microbiology summarized: 4/30-blood cultures NGTD 5/5-blood culture with Streptococcus pneumoniae 5/5-abscess culture with Streptococcus pneumoniae 5/6-repeat blood cultures NGTD  Assessment and  plan: Epidural abscess with severe canal stenosis Streptococcus pneumoniae bacteremia Severe lower back due to the above -S/p decompressive lumbar laminectomy, medial facetectomy and foraminostomies from T12-L3 with evacuation of epidural abscess by Dr. Rochelle Chu on 5/5 -Presents with lower back pain radiating to right groin for days. -CRP and ESR markedly elevated. -Follow TTE and TEE. -Pain and glycemic control -Bowel regimen to counteract constipation -PT/OT-, and CIR.  CIR following. - Appreciate help by neurosurgery and ID  Acute encephalopathy/delirium: Some confusion overnight.  Well-oriented this morning. -Minimize sedating medications -Reorientation and delirium precaution -Needs safety sitter intermittently.  Uncontrolled NIDDM-2 with hyperglycemia: A1c 8.7%.  On metformin  and Jardiance  at home Recent Labs  Lab 10/30/23 2135 10/31/23 0559 10/31/23 0815 10/31/23 1059 10/31/23 1333  GLUCAP 164* 207* 192* 191* 301*  -Change [name] to resistant -Increase Semglee from 25 to 30 units daily -Continue NovoLog 7 units 3 times daily with meals -Further adjustment as appropriate  Proteinuria -Continue home lisinopril  Leukocytosis/bandemia: Likely due to the above.  Slightly worse today. - Antibiotics per ID  Hyponatremia: Mild - Continue monitoring  Body mass index is 12.5 kg/m. -Will obtain weight and height for accurate BMI           DVT prophylaxis:  SCD's Start: 10/29/23 [id number] for now.  Code Status: Full code Family Communication: None at bedside today Level of care: Med-Surg Status is: Inpatient Remains inpatient appropriate because: Epidural abscess and bacteremia   Final disposition: CIR?   55 minutes with more than 50% spent in reviewing records, counseling patient/family and coordinating care.   Sch Meds:  Scheduled Meds:  amLODipine  10 mg Oral Daily   bisacodyl  10 mg Rectal Daily   celecoxib  200 mg Oral Q12H   insulin aspart  0-15 Units  Subcutaneous TID WC   insulin aspart  0-5 Units Subcutaneous QHS   insulin aspart  7 Units Subcutaneous TID WC   insulin glargine-yfgn  30 Units Subcutaneous Daily   senna-docusate  2 tablet Oral BID   sodium chloride  flush  3 mL Intravenous Q12H   Continuous Infusions:  cefTRIAXone  (ROCEPHIN )  IV 2 g (10/31/23 0520)   PRN Meds:.haloperidol lactate, HYDROmorphone  (DILAUDID ) injection, labetalol, loratadine, magnesium citrate, menthol-cetylpyridinium **OR** phenol, methocarbamol  **OR** methocarbamol  (ROBAXIN ) injection, ondansetron  **OR** ondansetron  (ZOFRAN ) IV, mouth rinse, oxyCODONE , [COMPLETED] polyethylene glycol **FOLLOWED BY** polyethylene glycol, sodium chloride  flush  Antimicrobials: Anti-infectives (From admission, onward)    Start     Dose/Rate Route Frequency Ordered Stop   10/31/23 0600  cefTRIAXone  (ROCEPHIN ) 2 g in sodium chloride  0.9 % 100 mL IVPB        2 g 200 mL/hr over 30 Minutes Intravenous Every 24 hours 10/30/23 1012     10/30/23 1000  vancomycin (VANCOREADY) IVPB 500 mg/100 mL  Status:  Discontinued        500 mg 100 mL/hr over 60 Minutes Intravenous Every 24 hours 10/29/23 1915 10/30/23 1012   10/30/23 1000  cefTRIAXone  (ROCEPHIN ) 2 g in sodium chloride  0.9 % 100 mL IVPB  Status:  Discontinued        2 g 200 mL/hr over 30 Minutes Intravenous Every 12 hours 10/29/23 2202 10/29/23 2203   10/30/23 0600  cefTRIAXone  (ROCEPHIN ) 2 g in sodium chloride  0.9 % 100 mL IVPB  Status:  Discontinued        2 g 200 mL/hr over 30 Minutes Intravenous Every 12 hours 10/30/23 0457 10/30/23 1012   10/29/23 2300  metroNIDAZOLE (FLAGYL) IVPB 500 mg  Status:  Discontinued        500 mg 100 mL/hr over 60 Minutes Intravenous Every 12 hours 10/29/23 2202 10/30/23 0457   10/29/23 2300  ceFEPIme (MAXIPIME) 2 g in sodium chloride  0.9 % 100 mL IVPB  Status:  Discontinued        2 g 200 mL/hr over 30 Minutes Intravenous Every 12 hours 10/29/23 2209 10/30/23 0457   10/29/23 2200   cefTRIAXone  (ROCEPHIN ) 1 g in sodium chloride  0.9 % 100 mL IVPB  Status:  Discontinued        1 g 200 mL/hr over 30 Minutes Intravenous Every 12 hours 10/29/23 1855 10/29/23 2202   10/29/23 1424  vancomycin (VANCOCIN) powder  Status:  Discontinued          As needed 10/29/23 1424 10/29/23 1454   10/29/23 1130  cefTRIAXone  (ROCEPHIN ) 2 g in sodium chloride  0.9 % 100 mL IVPB        2 g 200 mL/hr over 30 Minutes Intravenous  Once 10/29/23 1115 10/29/23 1255   10/29/23 1130  vancomycin (VANCOCIN) IVPB 1000 mg/200 mL premix        1,000 mg 200 mL/hr over 60 Minutes Intravenous  Once 10/29/23 1115 10/29/23 1230   10/29/23 1119  sodium chloride  0.9 % with cefTRIAXone  (ROCEPHIN ) ADS Med       Note to Pharmacy: Franceen Inches: cabinet override      10/29/23 1119 10/29/23 1310   10/29/23 1119  vancomycin (VANCOCIN) 1-5 GM/200ML-% IVPB       Note to Pharmacy: Franceen Inches: cabinet override      10/29/23 1119 10/29/23 1241        I have personally reviewed the following labs and images: CBC: Recent Labs  Lab 10/24/23 1517 10/26/23 2025 10/28/23 0506 10/29/23 0658 10/30/23 0621 10/31/23 0534  WBC 18.4* 16.5* 21.3* 18.5* 17.7* 21.1*  NEUTROABS 15.8* 13.3*  --   --   --   --   HGB 11.7* 11.1* 11.9* 11.6* 11.3* 10.7*  HCT 37.7 34.4* 37.4 35.0* 34.1* 32.8*  MCV 87.3 84.9 84.4 82.9 82.8 83.7  PLT 305 289 328 342 365 327   BMP &GFR Recent Labs  Lab 10/26/23 2025 10/28/23 0506 10/29/23 0658 10/30/23 0621 10/31/23 0534  NA 135 136 134* 134* 139  K 4.1 3.7 3.6 3.7 4.1  CL 99 98 97* 95* 100  CO2 23 22 25 23 28   GLUCOSE 310* 357* 320* 303* 190*  BUN 17 21 31* 23 17  CREATININE 0.59 0.87 0.75 0.81 0.79  CALCIUM  9.1 9.5 9.4 9.1 9.0  MG  --   --  2.1 1.9 2.1  PHOS  --   --  3.4 2.4* 2.5   Estimated Creatinine Clearance: 41.4 mL/min (by C-G formula based on SCr of 0.79 mg/dL). Liver & Pancreas: Recent Labs  Lab 10/24/23 1517 10/26/23 2025 10/29/23 0658 10/30/23 0621  10/31/23 0534  AST 25 27  --   --   --   ALT 30 29  --   --   --  ALKPHOS 93 89  --   --   --   BILITOT 0.8 1.3*  --   --   --   PROT 8.6* 8.3*  --   --   --   ALBUMIN 3.4* 3.0* 2.4* 2.4* 2.3*   No results for input(s): "LIPASE", "AMYLASE" in the last 168 hours.  No results for input(s): "AMMONIA" in the last 168 hours. Diabetic: No results for input(s): "HGBA1C" in the last 72 hours.  Recent Labs  Lab 10/30/23 2135 10/31/23 0559 10/31/23 0815 10/31/23 1059 10/31/23 1333  GLUCAP 164* 207* 192* 191* 301*   Cardiac Enzymes: No results for input(s): "CKTOTAL", "CKMB", "CKMBINDEX", "TROPONINI" in the last 168 hours. No results for input(s): "PROBNP" in the last 8760 hours. Coagulation Profile: Recent Labs  Lab 10/24/23 1517  INR 1.2   Thyroid  Function Tests: No results for input(s): "TSH", "T4TOTAL", "FREET4", "T3FREE", "THYROIDAB" in the last 72 hours. Lipid Profile: Recent Labs    10/31/23 0534  CHOL 75  HDL 26*  LDLCALC 37  TRIG 62  CHOLHDL 2.9   Anemia Panel: No results for input(s): "VITAMINB12", "FOLATE", "FERRITIN", "TIBC", "IRON", "RETICCTPCT" in the last 72 hours. Urine analysis:    Component Value Date/Time   COLORURINE YELLOW 10/26/2023 2027   APPEARANCEUR CLEAR 10/26/2023 2027   LABSPEC 1.031 (H) 10/26/2023 2027   PHURINE 5.0 10/26/2023 2027   GLUCOSEU >=500 (A) 10/26/2023 2027   HGBUR MODERATE (A) 10/26/2023 2027   BILIRUBINUR NEGATIVE 10/26/2023 2027   KETONESUR 20 (A) 10/26/2023 2027   PROTEINUR >=300 (A) 10/26/2023 2027   NITRITE NEGATIVE 10/26/2023 2027   LEUKOCYTESUR NEGATIVE 10/26/2023 2027   Sepsis Labs: Invalid input(s): "PROCALCITONIN", "LACTICIDVEN"  Microbiology: Recent Results (from the past 240 hours)  Blood Culture (routine x 2)     Status: None   Collection Time: 10/24/23  3:12 PM   Specimen: BLOOD LEFT ARM  Result Value Ref Range Status   Specimen Description   Final    BLOOD LEFT ARM Performed at [facility], [address], [place], [place] [address]    Special Requests   Final    BOTTLES DRAWN AEROBIC AND ANAEROBIC Blood Culture results may not be optimal due to an inadequate volume of blood received in culture bottles Performed at [facility], [address], [place], [place] [address]    Culture   Final    NO GROWTH 5 DAYS Performed at [facility], [address] [place], [place] [address]    Report Status 10/29/2023 FINAL  Final  Blood Culture (routine x 2)     Status: None   Collection Time: 10/24/23  3:17 PM   Specimen: BLOOD RIGHT ARM  Result Value Ref Range Status   Specimen Description   Final    BLOOD RIGHT ARM Performed at [facility], [address] [place], [place] [address]    Special Requests   Final    BOTTLES DRAWN AEROBIC AND ANAEROBIC Blood Culture results may not be optimal due to an inadequate volume of blood received in culture bottles Performed at [facility], [address] [place], [place] [address]    Culture   Final    NO GROWTH 5 DAYS Performed at [facility], [address], [place], [place] [address]    Report Status 10/29/2023 FINAL  Final  Resp panel by RT-PCR (RSV, Flu A&B, Covid) Anterior Nasal Swab     Status: None  Collection Time: 10/24/23  3:57 PM   Specimen: Anterior Nasal Swab  Result Value Ref Range Status   SARS Coronavirus 2 by RT PCR NEGATIVE NEGATIVE Final    Comment: (NOTE) SARS-CoV-2 target nucleic acids are NOT DETECTED.  The SARS-CoV-2 RNA is generally detectable in upper respiratory specimens during the acute phase of infection. The lowest concentration of SARS-CoV-2 viral copies this assay can detect is 138 copies/mL. A negative result does not preclude SARS-Cov-2 infection and should not be used as the sole basis for treatment or other patient management decisions. A negative result may occur with  improper specimen collection/handling, submission of specimen  other than nasopharyngeal swab, presence of viral mutation(s) within the areas targeted by this assay, and inadequate number of viral copies(<138 copies/mL). A negative result must be combined with clinical observations, patient history, and epidemiological information. The expected result is Negative.  Fact Sheet for Patients:  BloggerCourse.com  Fact Sheet for Healthcare Providers:  SeriousBroker.it  This test is no t yet approved or cleared by the [place]  FDA and  has been authorized for detection and/or diagnosis of SARS-CoV-2 by FDA under an Emergency Use Authorization (EUA). This EUA will remain  in effect (meaning this test can be used) for the duration of the COVID-19 declaration under Section 564(b)(1) of the Act, 21 U.S.C.section 360bbb-3(b)(1), unless the authorization is terminated  or revoked sooner.       Influenza A by PCR NEGATIVE NEGATIVE Final   Influenza B by PCR NEGATIVE NEGATIVE Final    Comment: (NOTE) The Xpert Xpress SARS-CoV-2/FLU/RSV plus assay is intended as an aid in the diagnosis of influenza from Nasopharyngeal swab specimens and should not be used as a sole basis for treatment. Nasal washings and aspirates are unacceptable for Xpert Xpress SARS-CoV-2/FLU/RSV testing.  Fact Sheet for Patients: BloggerCourse.com  Fact Sheet for Healthcare Providers: SeriousBroker.it  This test is not yet approved or cleared by the [place]  FDA and has been authorized for detection and/or diagnosis of SARS-CoV-2 by FDA under an Emergency Use Authorization (EUA). This EUA will remain in effect (meaning this test can be used) for the duration of the COVID-19 declaration under Section 564(b)(1) of the Act, 21 U.S.C. section 360bbb-3(b)(1), unless the authorization is terminated or revoked.     Resp Syncytial Virus by PCR NEGATIVE NEGATIVE Final     Comment: (NOTE) Fact Sheet for Patients: BloggerCourse.com  Fact Sheet for Healthcare Providers: SeriousBroker.it  This test is not yet approved or cleared by the [place]  FDA and has been authorized for detection and/or diagnosis of SARS-CoV-2 by FDA under an Emergency Use Authorization (EUA). This EUA will remain in effect (meaning this test can be used) for the duration of the COVID-19 declaration under Section 564(b)(1) of the Act, 21 U.S.C. section 360bbb-3(b)(1), unless the authorization is terminated or revoked.  Performed at [facility], [address] [place], [place] [address]   Surgical pcr screen     Status: None   Collection Time: 10/29/23 10:02 AM   Specimen: Nasal Mucosa; Nasal Swab  Result Value Ref Range Status   MRSA, PCR NEGATIVE NEGATIVE Final   Staphylococcus aureus NEGATIVE NEGATIVE Final    Comment: (NOTE) The Xpert SA Assay (FDA approved for NASAL specimens in patients 71 years of age and older), is one component of a comprehensive surveillance program. It is not intended to diagnose infection nor to guide or monitor treatment. Performed at [facility], [address], [place], [place]  [address]   Culture, blood (Routine X 2) w Reflex to ID Panel     Status: None (Preliminary result)   Collection Time: 10/29/23 10:18 AM   Specimen: BLOOD RIGHT ARM  Result Value Ref Range Status   Specimen Description BLOOD RIGHT ARM  Final   Special Requests   Final    BOTTLES DRAWN AEROBIC AND ANAEROBIC Blood Culture adequate volume   Culture   Final    NO GROWTH 2 DAYS Performed at [facility], [address] [place], [place] [address]    Report Status PENDING  Incomplete  Culture, blood (Routine X 2) w Reflex to ID Panel     Status: Abnormal (Preliminary result)   Collection Time: 10/29/23 10:27 AM   Specimen: BLOOD LEFT ARM  Result Value Ref Range Status   Specimen  Description BLOOD LEFT ARM  Final   Special Requests   Final    BOTTLES DRAWN AEROBIC AND ANAEROBIC BACTEROIDES CACCAE   Culture  Setup Time   Final    GRAM POSITIVE COCCI IN PAIRS IN CHAINS ANAEROBIC BOTTLE ONLY CRITICAL RESULT CALLED TO, READ BACK BY AND VERIFIED WITH: PHARMD J LEDFORD 10/30/2023 @ 0440 BY AB    Culture (A)  Final    STREPTOCOCCUS PNEUMONIAE SUSCEPTIBILITIES TO FOLLOW Performed at [facility], [address] [place], [place] [address]    Report Status PENDING  Incomplete  Blood Culture ID Panel (Reflexed)     Status: Abnormal   Collection Time: 10/29/23 10:27 AM  Result Value Ref Range Status   Enterococcus faecalis NOT DETECTED NOT DETECTED Final   Enterococcus Faecium NOT DETECTED NOT DETECTED Final   Listeria monocytogenes NOT DETECTED NOT DETECTED Final   Staphylococcus species NOT DETECTED NOT DETECTED Final   Staphylococcus aureus (BCID) NOT DETECTED NOT DETECTED Final   Staphylococcus epidermidis NOT DETECTED NOT DETECTED Final   Staphylococcus lugdunensis NOT DETECTED NOT DETECTED Final   Streptococcus species DETECTED (A) NOT DETECTED Final    Comment: CRITICAL RESULT CALLED TO, READ BACK BY AND VERIFIED WITH: PHARMD J LEDFORD 10/30/2023 @ 0440 BY AB    Streptococcus agalactiae NOT DETECTED NOT DETECTED Final   Streptococcus pneumoniae DETECTED (A) NOT DETECTED Final    Comment: CRITICAL RESULT CALLED TO, READ BACK BY AND VERIFIED WITH: PHARMD J LEDFORD 10/30/2023 @ 0440 BY AB    Streptococcus pyogenes NOT DETECTED NOT DETECTED Final   A.calcoaceticus-baumannii NOT DETECTED NOT DETECTED Final   Bacteroides fragilis NOT DETECTED NOT DETECTED Final   Enterobacterales NOT DETECTED NOT DETECTED Final   Enterobacter cloacae complex NOT DETECTED NOT DETECTED Final   Escherichia coli NOT DETECTED NOT DETECTED Final   Klebsiella aerogenes NOT DETECTED NOT DETECTED Final   Klebsiella oxytoca NOT DETECTED NOT DETECTED Final   Klebsiella pneumoniae NOT  DETECTED NOT DETECTED Final   Proteus species NOT DETECTED NOT DETECTED Final   Salmonella species NOT DETECTED NOT DETECTED Final   Serratia marcescens NOT DETECTED NOT DETECTED Final   Haemophilus influenzae NOT DETECTED NOT DETECTED Final   Neisseria meningitidis NOT DETECTED NOT DETECTED Final   Pseudomonas aeruginosa NOT DETECTED NOT DETECTED Final   Stenotrophomonas maltophilia NOT DETECTED NOT DETECTED Final   Candida albicans NOT DETECTED NOT DETECTED Final   Candida auris NOT DETECTED NOT DETECTED Final   Candida glabrata NOT DETECTED NOT DETECTED Final   Candida krusei NOT DETECTED NOT DETECTED Final   Candida parapsilosis NOT DETECTED NOT DETECTED Final   Candida tropicalis NOT DETECTED  NOT DETECTED Final   Cryptococcus neoformans/gattii NOT DETECTED NOT DETECTED Final    Comment: Performed at [facility], [address], [place], [place] [address]  Aerobic/Anaerobic Culture w Gram Stain (surgical/deep wound)     Status: None (Preliminary result)   Collection Time: 10/29/23  1:13 PM   Specimen: PATH Soft tissue  Result Value Ref Range Status   Specimen Description ABSCESS  Final   Special Requests LUMBAR ABSCESS NO 1  Final   Gram Stain   Final    FEW WBC PRESENT, PREDOMINANTLY PMN RARE GRAM POSITIVE COCCI IN PAIRS Performed at [facility], [address], [place], [place] [address]    Culture   Final    FEW STREPTOCOCCUS PNEUMONIAE NO ANAEROBES ISOLATED; CULTURE IN PROGRESS FOR 5 DAYS    Report Status PENDING  Incomplete  Aerobic/Anaerobic Culture w Gram Stain (surgical/deep wound)     Status: None (Preliminary result)   Collection Time: 10/29/23  1:23 PM   Specimen: PATH Soft tissue  Result Value Ref Range Status   Specimen Description ABSCESS  Final   Special Requests LUMBAR ABSCESS NO 2  Final   Gram Stain   Final    ABUNDANT WBC PRESENT, PREDOMINANTLY PMN FEW GRAM POSITIVE COCCI IN PAIRS Performed at [facility], [address]  [place], [place] [address]    Culture   Final    MODERATE STREPTOCOCCUS PNEUMONIAE SUSCEPTIBILITIES TO FOLLOW NO ANAEROBES ISOLATED; CULTURE IN PROGRESS FOR 5 DAYS    Report Status PENDING  Incomplete  Aerobic/Anaerobic Culture w Gram Stain (surgical/deep wound)     Status: None (Preliminary result)   Collection Time: 10/29/23  1:30 PM   Specimen: PATH Soft tissue  Result Value Ref Range Status   Specimen Description ABSCESS  Final   Special Requests LUMBAR ABSCESS NO 3  Final   Gram Stain   Final    RARE WBC PRESENT, PREDOMINANTLY PMN RARE GRAM POSITIVE COCCI IN PAIRS Performed at [facility], [address], [place], [place] [address]    Culture   Final    RARE STREPTOCOCCUS PNEUMONIAE NO ANAEROBES ISOLATED; CULTURE IN PROGRESS FOR 5 DAYS    Report Status PENDING  Incomplete  Culture, blood (Routine X 2) w Reflex to ID Panel     Status: None (Preliminary result)   Collection Time: 10/30/23  5:48 PM   Specimen: BLOOD LEFT ARM  Result Value Ref Range Status   Specimen Description BLOOD LEFT ARM  Final   Special Requests   Final    BOTTLES DRAWN AEROBIC AND ANAEROBIC Blood Culture adequate volume   Culture   Final    NO GROWTH < 12 HOURS Performed at [facility], [address], [place], [place] [address]    Report Status PENDING  Incomplete  Culture, blood (Routine X 2) w Reflex to ID Panel     Status: None (Preliminary result)   Collection Time: 10/30/23  6:07 PM   Specimen: BLOOD LEFT ARM  Result Value Ref Range Status   Specimen Description BLOOD LEFT ARM  Final   Special Requests   Final    AEROBIC BOTTLE ONLY Blood Culture results may not be optimal due to an inadequate volume of blood received in culture bottles   Culture   Final    NO GROWTH < 12 HOURS Performed at [facility], [address] [place], [place] [address]    Report Status PENDING  Incomplete  Radiology Studies: No results found.     Gerardo Caiazzo T. Daziyah Cogan [facility]  If  7PM-7AM, please contact night-coverage [email or web address] 10/31/2023, 2:06 PM

## 2023-10-31 NOTE — Progress Notes (Signed)
*  PRELIMINARY RESULTS* Echocardiogram 2D Echocardiogram has been performed.  Kristina Long 10/31/2023, 3:58 PM

## 2023-10-31 NOTE — Plan of Care (Signed)
 Patient stable, no events. Pain management. Problem: Education: Goal: Ability to describe self-care measures that may prevent or decrease complications (Diabetes Survival Skills Education) will improve Outcome: Progressing Goal: Individualized Educational Video(s) Outcome: Progressing   Problem: Coping: Goal: Ability to adjust to condition or change in health will improve Outcome: Progressing   Problem: Fluid Volume: Goal: Ability to maintain a balanced intake and output will improve Outcome: Progressing   Problem: Health Behavior/Discharge Planning: Goal: Ability to identify and utilize available resources and services will improve Outcome: Progressing Goal: Ability to manage health-related needs will improve Outcome: Progressing   Problem: Metabolic: Goal: Ability to maintain appropriate glucose levels will improve Outcome: Progressing   Problem: Nutritional: Goal: Maintenance of adequate nutrition will improve Outcome: Progressing Goal: Progress toward achieving an optimal weight will improve Outcome: Progressing   Problem: Skin Integrity: Goal: Risk for impaired skin integrity will decrease Outcome: Progressing   Problem: Tissue Perfusion: Goal: Adequacy of tissue perfusion will improve Outcome: Progressing   Problem: Education: Goal: Knowledge of General Education information will improve Description: Including pain rating scale, medication(s)/side effects and non-pharmacologic comfort measures Outcome: Progressing   Problem: Health Behavior/Discharge Planning: Goal: Ability to manage health-related needs will improve Outcome: Progressing   Problem: Clinical Measurements: Goal: Ability to maintain clinical measurements within normal limits will improve Outcome: Progressing Goal: Will remain free from infection Outcome: Progressing Goal: Diagnostic test results will improve Outcome: Progressing Goal: Respiratory complications will improve Outcome:  Progressing Goal: Cardiovascular complication will be avoided Outcome: Progressing   Problem: Activity: Goal: Risk for activity intolerance will decrease Outcome: Progressing   Problem: Nutrition: Goal: Adequate nutrition will be maintained Outcome: Progressing   Problem: Coping: Goal: Level of anxiety will decrease Outcome: Progressing   Problem: Elimination: Goal: Will not experience complications related to bowel motility Outcome: Progressing Goal: Will not experience complications related to urinary retention Outcome: Progressing   Problem: Pain Managment: Goal: General experience of comfort will improve and/or be controlled Outcome: Progressing   Problem: Safety: Goal: Ability to remain free from injury will improve Outcome: Progressing   Problem: Skin Integrity: Goal: Risk for impaired skin integrity will decrease Outcome: Progressing   Problem: Education: Goal: Ability to verbalize activity precautions or restrictions will improve Outcome: Progressing Goal: Knowledge of the prescribed therapeutic regimen will improve Outcome: Progressing Goal: Understanding of discharge needs will improve Outcome: Progressing   Problem: Activity: Goal: Ability to avoid complications of mobility impairment will improve Outcome: Progressing Goal: Ability to tolerate increased activity will improve Outcome: Progressing Goal: Will remain free from falls Outcome: Progressing   Problem: Bowel/Gastric: Goal: Gastrointestinal status for postoperative course will improve Outcome: Progressing   Problem: Clinical Measurements: Goal: Ability to maintain clinical measurements within normal limits will improve Outcome: Progressing Goal: Postoperative complications will be avoided or minimized Outcome: Progressing Goal: Diagnostic test results will improve Outcome: Progressing   Problem: Pain Management: Goal: Pain level will decrease Outcome: Progressing   Problem: Skin  Integrity: Goal: Will show signs of wound healing Outcome: Progressing   Problem: Health Behavior/Discharge Planning: Goal: Identification of resources available to assist in meeting health care needs will improve Outcome: Progressing   Problem: Bladder/Genitourinary: Goal: Urinary functional status for postoperative course will improve Outcome: Progressing

## 2023-10-31 NOTE — Progress Notes (Signed)
 Patient ID: Kristina Long, female   DOB: 1956-02-14, 68 y.o.   MRN: [id number] She states she is doing better.  She has appropriate back soreness.  She denies significant leg pain.  It sounds like they are mobilizing her to the bathroom.  She is off of vancomycin and only on Rocephin  now per infectious disease.  He has good strength in her lower extremities to an in bed exam.  Continue antibiotics per ID.  We are following

## 2023-10-31 NOTE — Progress Notes (Signed)
 Physical Therapy Treatment Patient Details Name: Kristina Long MRN: [id number] DOB: [birth year] Today's Date: 10/31/2023   History of Present Illness 68 y.o. female admitted 10/26/23 with progressively worsening low back and R flank pain (seen in ED 4/28 and 4/30, eventually sent home); complicated by AMS, wandering in ED hallway, suspect related to pain meds. Lumbar CT showed Grade 1 L4-5 anterolisthesis. Pt unable to tolerate MRI. S/P decompressive lumbar laminectomy T12-L3 5/5.PMH includes DM.    PT Comments  Patient resting in bed and requesting assistance to bathroom. Pt unable to recall spinal precautions and reviewed 3/3 precautions. Min assist to raise trunk upright and pr reliant on EOB elevated for sit<>stand to RW. 3 attempts to stand with initiating power up with bil UE and Min assist required to finish rise. Pt amb with CGA and RW, cues to maintain safe proximity to RW and direct turn to align with commode. Pt able to stand with single UE support and min assist to steady to complete pericare after toileting. CGA to amb back to EOB. Pt completed additional sit<>stands from EOB to obtain standing weight. EOS mod assist to return to supine and repositioned with pillows for comfort. Will continue to progress pt as able during acute stay. Patient will benefit from intensive inpatient follow-up therapy, >3 hours/day.     If plan is discharge home, recommend the following: A lot of help with walking and/or transfers;A lot of help with bathing/dressing/bathroom;Supervision due to cognitive status;Assistance with cooking/housework;Assist for transportation;Help with stairs or ramp for entrance   Can travel by private vehicle        Equipment Recommendations  None recommended by PT    Recommendations for Other Services       Precautions / Restrictions Precautions Precautions: Back;Fall Recall of Precautions/Restrictions: Impaired Precaution/Restrictions Comments: reviewed BLT rules, no  brace needed Restrictions Weight Bearing Restrictions Per Provider Order: No     Mobility  Bed Mobility Overal bed mobility: Needs Assistance Bed Mobility: Rolling, Sidelying to Sit Rolling: Contact guard assist Sidelying to sit: Min assist Supine to sit: Mod assist, Used rails, HOB elevated     General bed mobility comments: Cues for sequencing to maintain spinal precautions and min assist to raise trunk. Mod assist for bringing LE's onto bed.    Transfers Overall transfer level: Needs assistance Equipment used: Rolling walker (2 wheels) Transfers: Sit to/from Stand Sit to Stand: Min assist           General transfer comment: Increased cueing for safety with use of RW, EOB elevated, pt able to initiate rise but ultimately min assist needed to fully power up. cues for use of grab bar in bathroom to rise from elevated toilet height.    Ambulation/Gait Ambulation/Gait assistance: Contact guard assist Gait Distance (Feet): 15 Feet (2x) Assistive device: Rolling walker (2 wheels) Gait Pattern/deviations: Step-through pattern, Decreased stride length, Trunk flexed, Drifts right/left Gait velocity: decr     General Gait Details: CGA for safety with short ambulation bout to bathroom and return to bed.   Stairs             Wheelchair Mobility     Tilt Bed    Modified Rankin (Stroke Patients Only)       Balance Overall balance assessment: Needs assistance Sitting-balance support: No upper extremity supported, Feet supported Sitting balance-Leahy Scale: Fair     Standing balance support: Bilateral upper extremity supported, During functional activity, Reliant on assistive device for balance Standing balance-Leahy Scale: Fair Standing balance comment: Reliant  on RW                            Communication Communication Communication: No apparent difficulties  Cognition Arousal: Alert Behavior During Therapy: WFL for tasks assessed/performed    PT - Cognitive impairments: Attention, Sequencing, Initiation                         Following commands: Intact      Cueing Cueing Techniques: Verbal cues, Tactile cues  Exercises      General Comments        Pertinent Vitals/Pain Pain Assessment Pain Assessment: Faces Faces Pain Scale: Hurts even more Pain Location: Back & abdomen Pain Descriptors / Indicators: Discomfort, Grimacing, Guarding, Moaning Pain Intervention(s): Limited activity within patient's tolerance, Monitored during session, Repositioned    Home Living                          Prior Function            PT Goals (current goals can now be found in the care plan section) Acute Rehab PT Goals PT Goal Formulation: With patient/family Time For Goal Achievement: 11/13/23 Potential to Achieve Goals: Good Progress towards PT goals: Progressing toward goals    Frequency    Min 5X/week      PT Plan      Co-evaluation              AM-PAC PT "6 Clicks" Mobility   Outcome Measure  Help needed turning from your back to your side while in a flat bed without using bedrails?: A Little Help needed moving from lying on your back to sitting on the side of a flat bed without using bedrails?: A Lot Help needed moving to and from a bed to a chair (including a wheelchair)?: A Little Help needed standing up from a chair using your arms (e.g., wheelchair or bedside chair)?: A Little Help needed to walk in hospital room?: A Little Help needed climbing 3-5 steps with a railing? : Total 6 Click Score: 15    End of Session Equipment Utilized During Treatment: Gait belt Activity Tolerance: Patient limited by pain Patient left: in bed;with call bell/phone within reach;with bed alarm set;with family/visitor present Nurse Communication: Mobility status PT Visit Diagnosis: Other abnormalities of gait and mobility (R26.89);Muscle weakness (generalized) (M62.81);Pain Pain - part of body:   (back)     Time: 5409-8119 PT Time Calculation (min) (ACUTE ONLY): 39 min  Charges:    $Gait Training: 8-22 mins $Therapeutic Activity: 23-37 mins PT General Charges $$ ACUTE PT VISIT: 1 Visit                     Tish Forge, DPT Acute Rehabilitation Services Office [phone]  10/31/23 5:19 PM

## 2023-10-31 NOTE — Progress Notes (Signed)
   [facility] has been requested to perform a transesophageal echocardiogram on [facility] for epidural abscess and strep pneumonia bacteremia.  After careful review of history and examination, the risks and benefits of transesophageal echocardiogram have been explained including risks of esophageal damage, perforation (1:10,000 risk), bleeding, pharyngeal hematoma as well as other potential complications associated with anesthesia including aspiration, arrhythmia, respiratory failure and death. Alternatives to treatment were discussed, questions were answered. Patient is willing to proceed.   Patient presented with leukocytosis and back pain and found to have strep pneumonia bacteremia and epidural abscess of her back. She underwent decompressive lumbar laminectomy and evacuation of epidural abscess.   Trinda Harlacher, [place] 10/31/2023 3:20 PM

## 2023-11-01 ENCOUNTER — Inpatient Hospital Stay (HOSPITAL_COMMUNITY)

## 2023-11-01 ENCOUNTER — Encounter (HOSPITAL_COMMUNITY)
Admission: EM | Disposition: A | Discharge status: Rehabilitation Facility | Payer: Self-pay | Source: Home / Self Care | Attending: Student

## 2023-11-01 ENCOUNTER — Encounter (HOSPITAL_COMMUNITY): Payer: Self-pay | Admitting: Student

## 2023-11-01 DIAGNOSIS — E119 Type 2 diabetes mellitus without complications: Secondary | ICD-10-CM | POA: Diagnosis not present

## 2023-11-01 DIAGNOSIS — E785 Hyperlipidemia, unspecified: Secondary | ICD-10-CM

## 2023-11-01 DIAGNOSIS — I351 Nonrheumatic aortic (valve) insufficiency: Secondary | ICD-10-CM | POA: Diagnosis not present

## 2023-11-01 DIAGNOSIS — R7881 Bacteremia: Secondary | ICD-10-CM

## 2023-11-01 DIAGNOSIS — M545 Low back pain, unspecified: Secondary | ICD-10-CM | POA: Diagnosis not present

## 2023-11-01 DIAGNOSIS — G9340 Encephalopathy, unspecified: Secondary | ICD-10-CM

## 2023-11-01 DIAGNOSIS — I38 Endocarditis, valve unspecified: Secondary | ICD-10-CM

## 2023-11-01 DIAGNOSIS — Z794 Long term (current) use of insulin: Secondary | ICD-10-CM

## 2023-11-01 DIAGNOSIS — R109 Unspecified abdominal pain: Secondary | ICD-10-CM

## 2023-11-01 DIAGNOSIS — Z9889 Other specified postprocedural states: Secondary | ICD-10-CM | POA: Diagnosis not present

## 2023-11-01 HISTORY — PX: TRANSESOPHAGEAL ECHOCARDIOGRAM (CATH LAB): EP1270

## 2023-11-01 LAB — CBC
HCT: 30.9 % — ABNORMAL LOW (ref 36.0–46.0)
Hemoglobin: 10.1 g/dL — ABNORMAL LOW (ref 12.0–15.0)
MCH: 27.4 pg (ref 26.0–34.0)
MCHC: 32.7 g/dL (ref 30.0–36.0)
MCV: 83.7 fL (ref 80.0–100.0)
Platelets: 344 10*3/uL (ref 150–400)
RBC: 3.69 MIL/uL — ABNORMAL LOW (ref 3.87–5.11)
RDW: 14.1 % (ref 11.5–15.5)
WBC: 17.1 10*3/uL — ABNORMAL HIGH (ref 4.0–10.5)
nRBC: 0 % (ref 0.0–0.2)

## 2023-11-01 LAB — GLUCOSE, CAPILLARY
Glucose-Capillary: 260 mg/dL — ABNORMAL HIGH (ref 70–99)
Glucose-Capillary: 179 mg/dL — ABNORMAL HIGH (ref 70–99)
Glucose-Capillary: 239 mg/dL — ABNORMAL HIGH (ref 70–99)
Glucose-Capillary: 201 mg/dL — ABNORMAL HIGH (ref 70–99)

## 2023-11-01 LAB — RENAL FUNCTION PANEL
Albumin: 2.3 g/dL — ABNORMAL LOW (ref 3.5–5.0)
Anion gap: 11 (ref 5–15)
BUN: 15 mg/dL (ref 8–23)
CO2: 28 mmol/L (ref 22–32)
Calcium: 8.9 mg/dL (ref 8.9–10.3)
Chloride: 97 mmol/L — ABNORMAL LOW (ref 98–111)
Creatinine, Ser: 0.57 mg/dL (ref 0.44–1.00)
GFR, Estimated: >60 mL/min (ref >60)
Glucose, Bld: 182 mg/dL — ABNORMAL HIGH (ref 70–99)
Phosphorus: 2.3 mg/dL — ABNORMAL LOW (ref 2.5–4.6)
Potassium: 3.8 mmol/L (ref 3.5–5.1)
Sodium: 136 mmol/L (ref 135–145)

## 2023-11-01 LAB — CULTURE, BLOOD (ROUTINE X 2)

## 2023-11-01 LAB — ECHO TEE

## 2023-11-01 LAB — MAGNESIUM: Magnesium: 2.1 mg/dL (ref 1.7–2.4)

## 2023-11-01 SURGERY — TRANSESOPHAGEAL ECHOCARDIOGRAM (TEE) (CATHLAB)
Anesthesia: Monitor Anesthesia Care

## 2023-11-01 MED ORDER — PHENYLEPHRINE 80 MCG/ML (10ML) SYRINGE FOR IV PUSH (FOR BLOOD PRESSURE SUPPORT)
PREFILLED_SYRINGE | INTRAVENOUS | Status: DC | PRN
  Administered 2023-11-01: 160 ug via INTRAVENOUS

## 2023-11-01 MED ORDER — LINAGLIPTIN 5 MG PO TABS
5.0000 mg | ORAL_TABLET | Freq: Every day | ORAL | Status: DC
  Administered 2023-11-01 – 2023-11-07 (×7): 5 mg via ORAL
  Filled 2023-11-01 (×7): qty 1

## 2023-11-01 MED ORDER — PROPOFOL 500 MG/50ML IV EMUL
INTRAVENOUS | Status: DC | PRN
  Administered 2023-11-01: 25 mg via INTRAVENOUS
  Administered 2023-11-01: 150 ug/kg/min via INTRAVENOUS
  Administered 2023-11-01: 25 mg via INTRAVENOUS

## 2023-11-01 MED ORDER — OXYCODONE HCL 5 MG PO TABS
5.0000 mg | ORAL_TABLET | Freq: Four times a day (QID) | ORAL | Status: DC | PRN
  Administered 2023-11-01 – 2023-11-06 (×11): 5 mg via ORAL
  Filled 2023-11-01 (×12): qty 1

## 2023-11-01 MED ORDER — SODIUM CHLORIDE 0.9 % IV SOLN
INTRAVENOUS | Status: DC | PRN
Start: 2023-11-01 — End: 2023-11-01

## 2023-11-01 MED ORDER — ACETAMINOPHEN 500 MG PO TABS
1000.0000 mg | ORAL_TABLET | Freq: Four times a day (QID) | ORAL | Status: DC
  Administered 2023-11-01 – 2023-11-06 (×21): 1000 mg via ORAL
  Filled 2023-11-01 (×23): qty 2

## 2023-11-01 MED ORDER — HYDROMORPHONE HCL 1 MG/ML IJ SOLN
0.5000 mg | INTRAMUSCULAR | Status: DC | PRN
  Administered 2023-11-02 – 2023-11-07 (×5): 0.5 mg via INTRAVENOUS
  Filled 2023-11-01 (×5): qty 0.5

## 2023-11-01 MED ORDER — ATORVASTATIN CALCIUM 10 MG PO TABS
10.0000 mg | ORAL_TABLET | Freq: Every day | ORAL | Status: DC
  Administered 2023-11-01 – 2023-11-06 (×6): 10 mg via ORAL
  Filled 2023-11-01 (×6): qty 1

## 2023-11-01 MED ORDER — LISINOPRIL 5 MG PO TABS
5.0000 mg | ORAL_TABLET | Freq: Every day | ORAL | Status: DC
  Administered 2023-11-01 – 2023-11-07 (×7): 5 mg via ORAL
  Filled 2023-11-01 (×7): qty 1

## 2023-11-01 MED ORDER — LIDOCAINE 2% (20 MG/ML) 5 ML SYRINGE
INTRAMUSCULAR | Status: DC | PRN
  Administered 2023-11-01: 40 mg via INTRAVENOUS

## 2023-11-01 NOTE — Interval H&P Note (Signed)
 History and Physical Interval Note:  11/01/2023 10:29 AM  [place]  has presented today for surgery, with the diagnosis of Bacteremia.  The various methods of treatment have been discussed with the patient and family. After consideration of risks, benefits and other options for treatment, the patient has consented to  Procedure(s): TRANSESOPHAGEAL ECHOCARDIOGRAM (N/A) as a surgical intervention.  The patient's history has been reviewed, patient examined, no change in status, stable for surgery.  I have reviewed the patient's chart and labs.  Questions were answered to the patient's satisfaction.     Jenna Routzahn

## 2023-11-01 NOTE — Transfer of Care (Signed)
 Immediate Anesthesia Transfer of Care Note  Patient: Caylea Valladolid  Procedure(s) Performed: TRANSESOPHAGEAL ECHOCARDIOGRAM  Patient Location: PACU and Cath Lab  Anesthesia Type:MAC  Level of Consciousness: drowsy  Airway & Oxygen  Therapy: Patient Spontanous Breathing and Patient connected to nasal cannula oxygen   Post-op Assessment: Report given to RN and Post -op Vital signs reviewed and stable  Post vital signs: Reviewed and stable  Last Vitals:  Vitals Value Taken Time  BP 120/62 11/01/23 1315  Temp    Pulse 91 11/01/23 1315  Resp 16 11/01/23 1315  SpO2 100% 11/01/23 1315    Last Pain:  Vitals:   11/01/23 1240  TempSrc:   PainSc: 8       Patients Stated Pain Goal: 0 (11/01/23 1240)  Complications: No notable events documented.

## 2023-11-01 NOTE — Progress Notes (Signed)
 Pt alert and oriented x4, but is experiencing some delirium. Pt refuses all safety precautions at this time and is aware she is high falls risk. Pt refuses tele or any leads/cords attached to her at this time. Pt has requested for staff to leave room and not bother her. Notified provider E. Guillermo Lees and Press photographer, Monalisa Angles

## 2023-11-01 NOTE — CV Procedure (Signed)
    TRANSESOPHAGEAL ECHOCARDIOGRAM   NAME:  Kristina Long    MRN: [id number] DOB:  [birth year]    ADMIT DATE: 10/26/2023  INDICATIONS: bacteremia  PROCEDURE:   Informed consent was obtained prior to the procedure. The risks, benefits and alternatives for the procedure were discussed and the patient comprehended these risks.  Risks include, but are not limited to, cough, sore throat, vomiting, nausea, somnolence, esophageal and stomach trauma or perforation, bleeding, low blood pressure, aspiration, pneumonia, infection, trauma to the teeth and death.    Procedural time out performed. The oropharynx was anesthetized with viscous lidocaine.  Anesthesia was administered by the anaesthesilogy team.  to achieve and maintain moderate to deep conscious sedation.  The patient's heart rate, blood pressure, and oxygen  saturation were monitored continuously during the procedure.  The transesophageal probe was inserted in the esophagus and stomach without difficulty and multiple views were obtained.   The patient tolerated the procedure well.  COMPLICATIONS:    There were no immediate complications.  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 Further management per primary team.   [name], DO [facility]  [facility]  1:20 PM

## 2023-11-01 NOTE — Progress Notes (Signed)
 Occupational Therapy Treatment Patient Details Name: Kristina Long MRN: [id number] DOB: October 10, 1955 Today's Date: 11/01/2023   History of present illness 68 y.o. female admitted 10/26/23 with progressively worsening low back and R flank pain (seen in ED 4/28 and 4/30, eventually sent home); complicated by AMS, wandering in ED hallway, suspect related to pain meds. Lumbar CT showed Grade 1 L4-5 anterolisthesis. Pt unable to tolerate MRI. S/P decompressive lumbar laminectomy T12-L3 5/5.PMH includes DM.   OT comments  Pt progressing towards goals. Pt received in bed, per RN pt A & O but with decreased cog. During session pt able to recall precautions but unable to recall compensatory strategies reviewed in previous sessions. Pt with poor ability to follow simple commands and sequence tasks. Per chart review pt continues to remain oriented, but during session poor stm, impulsive, and poor safety awareness. Attempted OOB activities, but once pt came to standing began to c/o of lightheadedness but with conflicting statements of requesting to walk or lay back down. OT returned pt to supine. RN notified. Continue to recommend >3 hours of skilled rehab daily to optimize independence levels. Will continue to follow acutely.       If plan is discharge home, recommend the following:  A little help with walking and/or transfers;A little help with bathing/dressing/bathroom;Direct supervision/assist for medications management;Supervision due to cognitive status   Equipment Recommendations  Other (comment) (defer to next venue)    Recommendations for Other Services      Precautions / Restrictions Precautions Precautions: Back;Fall Recall of Precautions/Restrictions: Impaired Precaution/Restrictions Comments: reviewed BLT rules, no brace needed Restrictions Weight Bearing Restrictions Per Provider Order: No       Mobility Bed Mobility Overal bed mobility: Needs Assistance Bed Mobility: Rolling,  Sidelying to Sit, Sit to Sidelying Rolling: Mod assist, Max assist Sidelying to sit: Mod assist, HOB elevated, Used rails     Sit to sidelying: Min assist General bed mobility comments: Unable to recall log roll, heavy cues for sequencing and mod assist for lifting trunk    Transfers Overall transfer level: Needs assistance Equipment used: Rolling walker (2 wheels) Transfers: Sit to/from Stand Sit to Stand: Mod assist, From elevated surface       General transfer comment: Mod assist to rise, unable to progress further d/t feelings of lightheadedness     Balance Overall balance assessment: Needs assistance Sitting-balance support: No upper extremity supported, Feet supported Sitting balance-Leahy Scale: Fair     Standing balance support: Bilateral upper extremity supported, During functional activity, Reliant on assistive device for balance Standing balance-Leahy Scale: Fair Standing balance comment: Reliant on RW           ADL either performed or assessed with clinical judgement   ADL Overall ADL's : Needs assistance/impaired Eating/Feeding: Set up;Bed level      Extremity/Trunk Assessment Upper Extremity Assessment Upper Extremity Assessment: Overall WFL for tasks assessed   Lower Extremity Assessment Lower Extremity Assessment: Defer to PT evaluation        Vision   Vision Assessment?: No apparent visual deficits   Perception     Praxis     Communication Communication Communication: No apparent difficulties   Cognition Arousal: Alert Behavior During Therapy: Impulsive Cognition: Cognition impaired     Awareness: Intellectual awareness intact, Online awareness impaired Memory impairment (select all impairments): Short-term memory Attention impairment (select first level of impairment): Sustained attention Executive functioning impairment (select all impairments): Problem solving, Organization OT - Cognition Comments: Pt presenting with cog  dysfunction, difficulty following one  step commands, poor reasoning and safety awareness, when given a set of 3 words, unable to recall after short time period                 Following commands: Impaired Following commands impaired: Follows one step commands inconsistently, Follows one step commands with increased time      Cueing   Cueing Techniques: Verbal cues, Tactile cues        General Comments Upon standing pt reporting increased pain and dizziness, returned to supine    Pertinent Vitals/ Pain       Pain Assessment Pain Assessment: Faces Faces Pain Scale: Hurts whole lot Pain Location: Back & abdomen Pain Descriptors / Indicators: Discomfort, Grimacing, Guarding, Moaning Pain Intervention(s): Limited activity within patient's tolerance   Frequency  Min 2X/week        Progress Toward Goals  OT Goals(current goals can now be found in the care plan section)  Progress towards OT goals: Progressing toward goals  Acute Rehab OT Goals Patient Stated Goal: To rest OT Goal Formulation: With patient Time For Goal Achievement: 11/13/23 Potential to Achieve Goals: Good ADL Goals Pt Will Perform Grooming: with supervision;standing Pt Will Perform Lower Body Dressing: with min assist;sit to/from stand Pt Will Transfer to Toilet: with supervision;regular height toilet Additional ADL Goal #1: Pt will recall 3/3 back precautions and maintain them during a standing grooming tasks.  Plan         AM-PAC OT "6 Clicks" Daily Activity     Outcome Measure   Help from another person eating meals?: None Help from another person taking care of personal grooming?: A Little Help from another person toileting, which includes using toliet, bedpan, or urinal?: A Little Help from another person bathing (including washing, rinsing, drying)?: A Lot Help from another person to put on and taking off regular upper body clothing?: A Little Help from another person to put on and taking  off regular lower body clothing?: A Lot 6 Click Score: 17    End of Session Equipment Utilized During Treatment: Gait belt;Rolling walker (2 wheels)  OT Visit Diagnosis: Unsteadiness on feet (R26.81);Other abnormalities of gait and mobility (R26.89)   Activity Tolerance Patient limited by pain   Patient Left in bed;with call bell/phone within reach;with bed alarm set;with family/visitor present   Nurse Communication Mobility status        Time: 2130-8657 OT Time Calculation (min): 21 min  Charges: OT General Charges $OT Visit: 1 Visit OT Treatments $Self Care/Home Management : 8-22 mins  Delmer Ferraris, OT  Acute Rehabilitation Services Office [phone] Secure chat preferred   Mickael Alamo 11/01/2023, 4:48 PM

## 2023-11-01 NOTE — Anesthesia Postprocedure Evaluation (Signed)
 Anesthesia Post Note  Patient: Kristina Long  Procedure(s) Performed: TRANSESOPHAGEAL ECHOCARDIOGRAM     Patient location during evaluation: Cath Lab Anesthesia Type: MAC Level of consciousness: awake and alert Pain management: pain level controlled Vital Signs Assessment: post-procedure vital signs reviewed and stable Respiratory status: spontaneous breathing Cardiovascular status: stable Anesthetic complications: no   No notable events documented.  Last Vitals:  Vitals:   11/01/23 1240 11/01/23 1318  BP:  116/68  Pulse: 99 96  Resp: (!) 21 13  Temp:  36.4 C  SpO2: 93% 100%    Last Pain:  Vitals:   11/01/23 1318  TempSrc: Tympanic  PainSc: Asleep                 Gorman Laughter

## 2023-11-01 NOTE — Progress Notes (Signed)
 Mobility Specialist Progress Note:    11/01/23 0955  Mobility  Activity Ambulated with assistance to bathroom;Ambulated with assistance in room  Level of Assistance Minimal assist, patient does 75% or more  Assistive Device Front wheel walker  Distance Ambulated (ft) 50 ft (20+30)  Activity Response Tolerated well  Mobility Referral Yes  Mobility visit 1 Mobility  Mobility Specialist Start Time (ACUTE ONLY) 0919  Mobility Specialist Stop Time (ACUTE ONLY) [id number]  Mobility Specialist Time Calculation (min) (ACUTE ONLY) 19 min   Pt received in chair and agreeable. C/o pain all over. Requested assistance to BR. Required minA to stand on third attempt. Void successful & performed peri care independently. Required minA to stand from toilet. Pt left in chair with call bell and all needs met. Chair alarm on and family present.  D'Vante Nolon Baxter Mobility Specialist Please contact via Special educational needs teacher or Rehab office at [phone]

## 2023-11-01 NOTE — Progress Notes (Signed)
 IP rehab admissions - We received a denial from insurance carrier today for CIR saying patient was not ready yet for CIR.  I will have my partner follow up tomorrow.  [phone]

## 2023-11-01 NOTE — Progress Notes (Signed)
 Subjective: Patient reports doing well, some back soreness.   Objective: Vital signs in last 24 hours: Temp:  [97.9 F (36.6 C)-98.7 F (37.1 C)] 98.7 F (37.1 C) (05/08 0424) Pulse Rate:  [98-102] 102 (05/08 0424) Resp:  [17-20] 17 (05/08 0424) BP: (142-167)/(73-86) 167/86 (05/08 0424) SpO2:  [95 %-100 %] 97 % (05/08 0424) Weight:  [86.6 kg] 86.6 kg (05/07 1600)  Intake/Output from previous day: No intake/output data recorded. Intake/Output this shift: No intake/output data recorded.  Neurologic: Grossly normal  Lab Results: Lab Results  Component Value Date   WBC 21.1 (H) 10/31/2023   HGB 10.7 (L) 10/31/2023   HCT 32.8 (L) 10/31/2023   MCV 83.7 10/31/2023   PLT 327 10/31/2023   Lab Results  Component Value Date   INR 1.2 10/24/2023   BMET Lab Results  Component Value Date   NA 139 10/31/2023   K 4.1 10/31/2023   CL 100 10/31/2023   CO2 28 10/31/2023   GLUCOSE 190 (H) 10/31/2023   BUN 17 10/31/2023   CREATININE 0.79 10/31/2023   CALCIUM  9.0 10/31/2023    Studies/Results: ECHOCARDIOGRAM COMPLETE Result Date: 10/31/2023    ECHOCARDIOGRAM REPORT   Patient Name:   Kristina Long Date of Exam: 10/31/2023 Medical Rec #:  [id number]      Height:       70.0 in Accession #:    [id number]     Weight:       87.1 lb Date of Birth:  1955-12-17     BSA:          1.466 m Patient Age:    68 years       BP:           143/73 mmHg Patient Gender: F              HR:           103 bpm. Exam Location:  Inpatient Procedure: 2D Echo, Cardiac Doppler and Color Doppler (Both Spectral and Color            Flow Doppler were utilized during procedure). Indications:    Bacteremia  History:        Patient has no prior history of Echocardiogram examinations.  Sonographer:    Andrena Bang Referring Phys: Bubba Carbo  Sonographer Comments: Difficult study due to suboptimal positioning. Pt experienced severe back pain during exam. IMPRESSIONS  1. Left ventricular ejection fraction, by estimation, is  55 to 60%. The left ventricle has normal function. The left ventricle has no regional wall motion abnormalities. Left ventricular diastolic parameters were normal.  2. Right ventricular systolic function is normal. The right ventricular size is normal.  3. Left atrial size was mildly dilated.  4. The mitral valve is normal in structure. No evidence of mitral valve regurgitation. No evidence of mitral stenosis.  5. The aortic valve is tricuspid. Aortic valve regurgitation is not visualized. No aortic stenosis is present.  6. The inferior vena cava is normal in size with greater than 50% respiratory variability, suggesting right atrial pressure of 3 mmHg. Conclusion(s)/Recommendation(s): No evidence of valvular vegetations on this transthoracic echocardiogram. Consider a transesophageal echocardiogram to exclude infective endocarditis if clinically indicated. FINDINGS  Left Ventricle: Left ventricular ejection fraction, by estimation, is 55 to 60%. The left ventricle has normal function. The left ventricle has no regional wall motion abnormalities. The left ventricular internal cavity size was normal in size. There is  no left ventricular hypertrophy. Left ventricular diastolic parameters were  normal. Right Ventricle: The right ventricular size is normal. No increase in right ventricular wall thickness. Right ventricular systolic function is normal. Left Atrium: Left atrial size was mildly dilated. Right Atrium: Right atrial size was normal in size. Pericardium: There is no evidence of pericardial effusion. Mitral Valve: The mitral valve is normal in structure. No evidence of mitral valve regurgitation. No evidence of mitral valve stenosis. Tricuspid Valve: The tricuspid valve is normal in structure. Tricuspid valve regurgitation is not demonstrated. No evidence of tricuspid stenosis. Aortic Valve: The aortic valve is tricuspid. Aortic valve regurgitation is not visualized. No aortic stenosis is present. Aortic valve  mean gradient measures 4.0 mmHg. Aortic valve peak gradient measures 9.0 mmHg. Aortic valve area, by VTI measures 2.99 cm. Pulmonic Valve: The pulmonic valve was normal in structure. Pulmonic valve regurgitation is not visualized. No evidence of pulmonic stenosis. Aorta: The aortic root is normal in size and structure. Venous: The inferior vena cava is normal in size with greater than 50% respiratory variability, suggesting right atrial pressure of 3 mmHg. IAS/Shunts: No atrial level shunt detected by color flow Doppler.  LEFT VENTRICLE PLAX 2D LVIDd:         4.80 cm      Diastology LVIDs:         3.50 cm      LV e' lateral:   12.30 cm/s LV PW:         1.00 cm      LV E/e' lateral: 7.3 LV IVS:        0.80 cm LVOT diam:     2.10 cm LV SV:         74 LV SV Index:   51 LVOT Area:     3.46 cm  LV Volumes (MOD) LV vol d, MOD A2C: 78.6 ml LV vol d, MOD A4C: 120.0 ml LV vol s, MOD A2C: 22.5 ml LV vol s, MOD A4C: 71.9 ml LV SV MOD A2C:     56.1 ml LV SV MOD A4C:     120.0 ml LV SV MOD BP:      57.2 ml RIGHT VENTRICLE RV S prime:     20.50 cm/s TAPSE (M-mode): 1.7 cm LEFT ATRIUM             Index LA Vol (A2C):   35.3 ml 24.08 ml/m LA Vol (A4C):   55.9 ml 38.13 ml/m LA Biplane Vol: 46.5 ml 31.72 ml/m  AORTIC VALVE AV Area (Vmax):    2.52 cm AV Area (Vmean):   2.25 cm AV Area (VTI):     2.99 cm AV Vmax:           150.00 cm/s AV Vmean:          97.400 cm/s AV VTI:            0.249 m AV Peak Grad:      9.0 mmHg AV Mean Grad:      4.0 mmHg LVOT Vmax:         109.00 cm/s LVOT Vmean:        63.300 cm/s LVOT VTI:          0.215 m LVOT/AV VTI ratio: 0.86  AORTA Ao Asc diam: 3.00 cm MITRAL VALVE MV Area (PHT): 4.99 cm    SHUNTS MV Decel Time: 152 msec    Systemic VTI:  0.22 m MV E velocity: 89.60 cm/s  Systemic Diam: 2.10 cm MV A velocity: 73.40 cm/s MV E/A ratio:  1.22 Mark  Skains MD Electronically signed by Dorothye Gathers MD Signature Date/Time: 10/31/2023/5:08:32 PM    Final     Assessment/Plan: S/p lumbar lami for  evacuation of epidural abscess. Doing well, minimal back pain, wound looks CDI. Continue to work with therapy   LOS: 5 days    [facility] 11/01/2023, 7:53 AM

## 2023-11-01 NOTE — Progress Notes (Signed)
 OT Cancellation Note  Patient Details Name: Kristina Long MRN: [id number] DOB: August 10, 1955   Cancelled Treatment:    Reason Eval/Treat Not Completed: Patient at procedure or test/ unavailable. Pt off unit at procedure. Will return as able to.  Kristina Long, OT  [facility] Office [phone] Secure chat preferred   Kristina Long 11/01/2023, 12:54 PM

## 2023-11-01 NOTE — Progress Notes (Addendum)
 PROGRESS NOTE  Kristina Long ZOX:[id number] DOB: Jun 06, 1956   PCP: Arlys Berke, MD  Patient is from: Home.  Lives with husband.  Independently ambulates at baseline.  DOA: 10/26/2023 LOS: 5  Chief complaints Chief Complaint  Patient presents with   Drug Overdose     Brief Narrative / Interim history: 68 year old F with PMH of DM-2 returning to ED for the third time in a week with right back and flank pain and found to have epidural abscess and Streptococcus bacteremia.  MRI thoracic/lumbar spine concerning for epidural abscess extending from T11-L5 with resultant severe canal stenosis at L1-L2 and L2-L3 and moderate canal stenosis at L3-L4.    Patient underwent decompressive lumbar laminectomy, [name]  Blood and abscess culture with Streptococcus pneumoniae (was negative on 4/30).  Antibiotic de-escalated to ceftriaxone  per ID.  Repeat blood cultures NGTD.  TTE without significant finding.  Plan for TEE today   Subjective: Seen and examined earlier this morning.  Some confusion/delirium overnight.  Reports improvement in her pain.  No other complaints.  Patient's husband at bedside.  Objective: Vitals:   11/01/23 1240 11/01/23 1318 11/01/23 1328 11/01/23 1338  BP:  116/68 117/69 135/73  Pulse: 99 96 98 98  Resp: (!) 21 13 20 12   Temp:  97.6 F (36.4 C)    TempSrc:  Tympanic    SpO2: 93% 100% 98% 100%  Weight:      Height:        Examination:  GENERAL: No apparent distress.  Nontoxic. HEENT: MMM.  Vision and hearing grossly intact.  NECK: Supple.  No apparent JVD.  RESP:  No IWOB.  Fair aeration bilaterally. CVS:  RRR. Heart sounds normal.  ABD/GI/GU: BS+. Abd soft, NTND.  MSK/EXT: Fair strength in both legs.  Symmetric.  Steri-Strip over surgical site. SKIN: Surgical wound DCI. NEURO: Awake, alert and x 4..  No apparent focal neuro deficit. PSYCH: Calm. Normal  affect.   Consultants:  None  Procedures: 5/5-decompressive lumbar laminectomy, medial facetectomy and foraminostomies from T12-L3 with evacuation of epidural abscess by Dr. Rochelle Chu   Microbiology summarized: 4/30-blood cultures NGTD 5/5-blood culture with Streptococcus pneumoniae 5/5-abscess culture with Streptococcus pneumoniae 5/6-repeat blood cultures NGTD  Assessment and plan: Epidural abscess with severe canal stenosis Streptococcus pneumoniae bacteremia Severe lower back due to the above -Presents with lower back pain radiating to right groin for days. -MRI thoracic or lumbar spine as above -CRP and ESR markedly elevated. -S/p surgical decompression by Dr. Rochelle Chu on 5/5 -TTE without significant finding. -Plan for TEE today -Pain and glycemic control -Bowel regimen to counteract constipation -PT/OT-, and CIR.  CIR following. -Appreciate help by neurosurgery and ID  Acute encephalopathy/delirium: Some confusion overnight.  Well-oriented this morning. -Minimize sedating medications -Reorientation and delirium precaution -Needs safety sitter intermittently.  Uncontrolled NIDDM-2 with hyperglycemia: A1c 8.7%.  On metformin  and Jardiance  at home Recent Labs  Lab 10/31/23 1333 10/31/23 1639 10/31/23 2116 11/01/23 0634 11/01/23 1107  GLUCAP 301* 321* 180* 260* 179*  -Continue [name]-resistant scale -Continue Semglee 30 units daily -Continue NovoLog 7 units 3 times daily with meals -Add Tradjenta instead of home Januvia  -Further adjustment as appropriate  Essential hypertension: BP improved. -Continue amlodipine-started here - Resume home lisinopril 5 mg daily - Pain control  Proteinuria -Continue home lisinopril  Leukocytosis/bandemia: Likely due to the above.  Improving. - Antibiotics per ID  Hyponatremia: Mild - Continue monitoring  Body mass  index is 27.41 kg/m.           DVT prophylaxis:  SCD's Start: 10/29/23 1856 Pharmacologic VTE prophylaxis  once okay from surgical standpoint  Code Status: Full code Family Communication: Updated patient's husband at bedside. Level of care: Med-Surg Status is: Inpatient Remains inpatient appropriate because: Epidural abscess and bacteremia   Final disposition: CIR?   55 minutes with more than 50% spent in reviewing records, counseling patient/family and coordinating care.   Sch Meds:  Scheduled Meds:  acetaminophen   1,000 mg Oral Q6H WA   amLODipine  10 mg Oral Daily   atorvastatin   10 mg Oral QHS   bisacodyl  10 mg Rectal Daily   celecoxib  200 mg Oral Q12H   insulin aspart  0-20 Units Subcutaneous TID WC   insulin aspart  0-5 Units Subcutaneous QHS   insulin aspart  7 Units Subcutaneous TID WC   insulin glargine-yfgn  30 Units Subcutaneous Daily   linagliptin  5 mg Oral Daily   lisinopril  5 mg Oral Daily   senna-docusate  2 tablet Oral BID   sodium chloride  flush  3 mL Intravenous Q12H   Continuous Infusions:  cefTRIAXone  (ROCEPHIN )  IV 2 g (11/01/23 0641)   PRN Meds:.haloperidol lactate, HYDROmorphone  (DILAUDID ) injection, labetalol, loratadine, magnesium citrate, menthol-cetylpyridinium **OR** phenol, methocarbamol  **OR** methocarbamol  (ROBAXIN ) injection, ondansetron  **OR** ondansetron  (ZOFRAN ) IV, mouth rinse, oxyCODONE , [COMPLETED] polyethylene glycol **FOLLOWED BY** polyethylene glycol, sodium chloride  flush  Antimicrobials: Anti-infectives (From admission, onward)    Start     Dose/Rate Route Frequency Ordered Stop   10/31/23 0600  cefTRIAXone  (ROCEPHIN ) 2 g in sodium chloride  0.9 % 100 mL IVPB        2 g 200 mL/hr over 30 Minutes Intravenous Every 24 hours 10/30/23 1012     10/30/23 1000  vancomycin (VANCOREADY) IVPB 500 mg/100 mL  Status:  Discontinued        500 mg 100 mL/hr over 60 Minutes Intravenous Every 24 hours 10/29/23 1915 10/30/23 1012   10/30/23 1000  cefTRIAXone  (ROCEPHIN ) 2 g in sodium chloride  0.9 % 100 mL IVPB  Status:  Discontinued        2 g 200  mL/hr over 30 Minutes Intravenous Every 12 hours 10/29/23 2202 10/29/23 2203   10/30/23 0600  cefTRIAXone  (ROCEPHIN ) 2 g in sodium chloride  0.9 % 100 mL IVPB  Status:  Discontinued        2 g 200 mL/hr over 30 Minutes Intravenous Every 12 hours 10/30/23 0457 10/30/23 1012   10/29/23 2300  metroNIDAZOLE (FLAGYL) IVPB 500 mg  Status:  Discontinued        500 mg 100 mL/hr over 60 Minutes Intravenous Every 12 hours 10/29/23 2202 10/30/23 0457   10/29/23 2300  ceFEPIme (MAXIPIME) 2 g in sodium chloride  0.9 % 100 mL IVPB  Status:  Discontinued        2 g 200 mL/hr over 30 Minutes Intravenous Every 12 hours 10/29/23 2209 10/30/23 0457   10/29/23 2200  cefTRIAXone  (ROCEPHIN ) 1 g in sodium chloride  0.9 % 100 mL IVPB  Status:  Discontinued        1 g 200 mL/hr over 30 Minutes Intravenous Every 12 hours 10/29/23 1855 10/29/23 2202   10/29/23 1424  vancomycin (VANCOCIN) powder  Status:  Discontinued          As needed 10/29/23 1424 10/29/23 1454   10/29/23 1130  cefTRIAXone  (ROCEPHIN ) 2 g in sodium chloride  0.9 % 100 mL IVPB  2 g 200 mL/hr over 30 Minutes Intravenous  Once 10/29/23 1115 10/29/23 1255   10/29/23 1130  vancomycin (VANCOCIN) IVPB 1000 mg/200 mL premix        1,000 mg 200 mL/hr over 60 Minutes Intravenous  Once 10/29/23 1115 10/29/23 1230   10/29/23 1119  sodium chloride  0.9 % with cefTRIAXone  (ROCEPHIN ) ADS Med       Note to Pharmacy: Franceen Inches: cabinet override      10/29/23 1119 10/29/23 1310   10/29/23 1119  vancomycin (VANCOCIN) 1-5 GM/200ML-% IVPB       Note to Pharmacy: Franceen Inches: cabinet override      10/29/23 1119 10/29/23 1241        I have personally reviewed the following labs and images: CBC: Recent Labs  Lab 10/26/23 2025 10/28/23 0506 10/29/23 0658 10/30/23 0621 10/31/23 0534 11/01/23 1053  WBC 16.5* 21.3* 18.5* 17.7* 21.1* 17.1*  NEUTROABS 13.3*  --   --   --   --   --   HGB 11.1* 11.9* 11.6* 11.3* 10.7* 10.1*  HCT 34.4* 37.4 35.0* 34.1*  32.8* 30.9*  MCV 84.9 84.4 82.9 82.8 83.7 83.7  PLT 289 328 342 365 327 344   BMP &GFR Recent Labs  Lab 10/28/23 0506 10/29/23 0658 10/30/23 0621 10/31/23 0534 11/01/23 1053  NA 136 134* 134* 139 136  K 3.7 3.6 3.7 4.1 3.8  CL 98 97* 95* 100 97*  CO2 22 25 23 28 28   GLUCOSE 357* 320* 303* 190* 182*  BUN 21 31* 23 17 15   CREATININE 0.87 0.75 0.81 0.79 0.57  CALCIUM  9.5 9.4 9.1 9.0 8.9  MG  --  2.1 1.9 2.1 2.1  PHOS  --  3.4 2.4* 2.5 2.3*   Estimated Creatinine Clearance: 79.3 mL/min (by C-G formula based on SCr of 0.57 mg/dL). Liver & Pancreas: Recent Labs  Lab 10/26/23 2025 10/29/23 0658 10/30/23 0621 10/31/23 0534 11/01/23 1053  AST 27  --   --   --   --   ALT 29  --   --   --   --   ALKPHOS 89  --   --   --   --   BILITOT 1.3*  --   --   --   --   PROT 8.3*  --   --   --   --   ALBUMIN 3.0* 2.4* 2.4* 2.3* 2.3*   No results for input(s): "LIPASE", "AMYLASE" in the last 168 hours.  No results for input(s): "AMMONIA" in the last 168 hours. Diabetic: No results for input(s): "HGBA1C" in the last 72 hours.  Recent Labs  Lab 10/31/23 1333 10/31/23 1639 10/31/23 2116 11/01/23 0634 11/01/23 1107  GLUCAP 301* 321* 180* 260* 179*   Cardiac Enzymes: No results for input(s): "CKTOTAL", "CKMB", "CKMBINDEX", "TROPONINI" in the last 168 hours. No results for input(s): "PROBNP" in the last 8760 hours. Coagulation Profile: No results for input(s): "INR", "PROTIME" in the last 168 hours.  Thyroid  Function Tests: No results for input(s): "TSH", "T4TOTAL", "FREET4", "T3FREE", "THYROIDAB" in the last 72 hours. Lipid Profile: Recent Labs    10/31/23 0534  CHOL 75  HDL 26*  LDLCALC 37  TRIG 62  CHOLHDL 2.9   Anemia Panel: No results for input(s): "VITAMINB12", "FOLATE", "FERRITIN", "TIBC", "IRON", "RETICCTPCT" in the last 72 hours. Urine analysis:    Component Value Date/Time   COLORURINE YELLOW 10/26/2023 2027   APPEARANCEUR CLEAR 10/26/2023 2027   LABSPEC  1.031 (H)  10/26/2023 2027   PHURINE 5.0 10/26/2023 2027   GLUCOSEU >=500 (A) 10/26/2023 2027   HGBUR MODERATE (A) 10/26/2023 2027   BILIRUBINUR NEGATIVE 10/26/2023 2027   KETONESUR 20 (A) 10/26/2023 2027   PROTEINUR >=300 (A) 10/26/2023 2027   NITRITE NEGATIVE 10/26/2023 2027   LEUKOCYTESUR NEGATIVE 10/26/2023 2027   Sepsis Labs: Invalid input(s): "PROCALCITONIN", "LACTICIDVEN"  Microbiology: Recent Results (from the past 240 hours)  Blood Culture (routine x 2)     Status: None   Collection Time: 10/24/23  3:12 PM   Specimen: BLOOD LEFT ARM  Result Value Ref Range Status   Specimen Description   Final    BLOOD LEFT ARM Performed at [facility], [address] [place], [place] [address]    Special Requests   Final    BOTTLES DRAWN AEROBIC AND ANAEROBIC Blood Culture results may not be optimal due to an inadequate volume of blood received in culture bottles Performed at [facility], [address], [place], [place] [address]    Culture   Final    NO GROWTH 5 DAYS Performed at [facility], [address], [place], [place] [address]    Report Status 10/29/2023 FINAL  Final  Blood Culture (routine x 2)     Status: None   Collection Time: 10/24/23  3:17 PM   Specimen: BLOOD RIGHT ARM  Result Value Ref Range Status   Specimen Description   Final    BLOOD RIGHT ARM Performed at [facility], [address], [place], [place] [address]    Special Requests   Final    BOTTLES DRAWN AEROBIC AND ANAEROBIC Blood Culture results may not be optimal due to an inadequate volume of blood received in culture bottles Performed at [facility], [address] [place], [place] [address]    Culture   Final    NO GROWTH 5 DAYS Performed at [facility], [address] [place], [place] [address]    Report Status 10/29/2023 FINAL  Final  Resp panel by RT-PCR (RSV, Flu A&B, Covid) Anterior Nasal Swab     Status: None    Collection Time: 10/24/23  3:57 PM   Specimen: Anterior Nasal Swab  Result Value Ref Range Status   SARS Coronavirus 2 by RT PCR NEGATIVE NEGATIVE Final    Comment: (NOTE) SARS-CoV-2 target nucleic acids are NOT DETECTED.  The SARS-CoV-2 RNA is generally detectable in upper respiratory specimens during the acute phase of infection. The lowest concentration of SARS-CoV-2 viral copies this assay can detect is 138 copies/mL. A negative result does not preclude SARS-Cov-2 infection and should not be used as the sole basis for treatment or other patient management decisions. A negative result may occur with  improper specimen collection/handling, submission of specimen other than nasopharyngeal swab, presence of viral mutation(s) within the areas targeted by this assay, and inadequate number of viral copies(<138 copies/mL). A negative result must be combined with clinical observations, patient history, and epidemiological information. The expected result is Negative.  Fact Sheet for Patients:  BloggerCourse.com  Fact Sheet for Healthcare Providers:  SeriousBroker.it  This test is no t yet approved or cleared by the [place]  FDA and  has been authorized for detection and/or diagnosis of SARS-CoV-2 by FDA under an Emergency Use Authorization (EUA). This EUA will remain  in effect (meaning this test can be used) for the duration of the COVID-19 declaration under Section 564(b)(1) of the Act, 21  U.S.C.section 360bbb-3(b)(1), unless the authorization is terminated  or revoked sooner.       Influenza A by PCR NEGATIVE NEGATIVE Final   Influenza B by PCR NEGATIVE NEGATIVE Final    Comment: (NOTE) The Xpert Xpress SARS-CoV-2/FLU/RSV plus assay is intended as an aid in the diagnosis of influenza from Nasopharyngeal swab specimens and should not be used as a sole basis for treatment. Nasal washings and aspirates are unacceptable for  Xpert Xpress SARS-CoV-2/FLU/RSV testing.  Fact Sheet for Patients: BloggerCourse.com  Fact Sheet for Healthcare Providers: SeriousBroker.it  This test is not yet approved or cleared by the [place]  FDA and has been authorized for detection and/or diagnosis of SARS-CoV-2 by FDA under an Emergency Use Authorization (EUA). This EUA will remain in effect (meaning this test can be used) for the duration of the COVID-19 declaration under Section 564(b)(1) of the Act, 21 U.S.C. section 360bbb-3(b)(1), unless the authorization is terminated or revoked.     Resp Syncytial Virus by PCR NEGATIVE NEGATIVE Final    Comment: (NOTE) Fact Sheet for Patients: BloggerCourse.com  Fact Sheet for Healthcare Providers: SeriousBroker.it  This test is not yet approved or cleared by the [place]  FDA and has been authorized for detection and/or diagnosis of SARS-CoV-2 by FDA under an Emergency Use Authorization (EUA). This EUA will remain in effect (meaning this test can be used) for the duration of the COVID-19 declaration under Section 564(b)(1) of the Act, 21 U.S.C. section 360bbb-3(b)(1), unless the authorization is terminated or revoked.  Performed at [facility], [address] [place], [place] [address]   Surgical pcr screen     Status: None   Collection Time: 10/29/23 10:02 AM   Specimen: Nasal Mucosa; Nasal Swab  Result Value Ref Range Status   MRSA, PCR NEGATIVE NEGATIVE Final   Staphylococcus aureus NEGATIVE NEGATIVE Final    Comment: (NOTE) The Xpert SA Assay (FDA approved for NASAL specimens in patients 93 years of age and older), is one component of a comprehensive surveillance program. It is not intended to diagnose infection nor to guide or monitor treatment. Performed at [facility], [address], [place], [place] [address]   Culture,  blood (Routine X 2) w Reflex to ID Panel     Status: None (Preliminary result)   Collection Time: 10/29/23 10:18 AM   Specimen: BLOOD RIGHT ARM  Result Value Ref Range Status   Specimen Description BLOOD RIGHT ARM  Final   Special Requests   Final    BOTTLES DRAWN AEROBIC AND ANAEROBIC Blood Culture adequate volume   Culture   Final    NO GROWTH 3 DAYS Performed at [facility], [address] [place], [place] [address]    Report Status PENDING  Incomplete  Culture, blood (Routine X 2) w Reflex to ID Panel     Status: Abnormal   Collection Time: 10/29/23 10:27 AM   Specimen: BLOOD LEFT ARM  Result Value Ref Range Status   Specimen Description BLOOD LEFT ARM  Final   Special Requests   Final    BOTTLES DRAWN AEROBIC AND ANAEROBIC BACTEROIDES CACCAE   Culture  Setup Time   Final    GRAM POSITIVE COCCI IN PAIRS IN CHAINS ANAEROBIC BOTTLE ONLY CRITICAL RESULT CALLED TO, READ BACK BY AND VERIFIED WITH: PHARMD J LEDFORD 10/30/2023 @ 0440 BY AB Performed at [facility], [address] [place], [place] [address]    Culture STREPTOCOCCUS PNEUMONIAE (A)  Final  Report Status 11/01/2023 FINAL  Final   Organism ID, Bacteria STREPTOCOCCUS PNEUMONIAE  Final      Susceptibility   Streptococcus pneumoniae - MIC*    ERYTHROMYCIN <=0.12 SENSITIVE Sensitive     LEVOFLOXACIN 1 SENSITIVE Sensitive     VANCOMYCIN 0.5 SENSITIVE Sensitive     PENICILLIN (meningitis) <=0.06 SENSITIVE Sensitive     PENO - penicillin <=0.06      PENICILLIN (non-meningitis) <=0.06 SENSITIVE Sensitive     PENICILLIN (oral) <=0.06 SENSITIVE Sensitive     CEFTRIAXONE  (non-meningitis) <=0.12 SENSITIVE Sensitive     CEFTRIAXONE  (meningitis) <=0.12 SENSITIVE Sensitive     * STREPTOCOCCUS PNEUMONIAE  Blood Culture ID Panel (Reflexed)     Status: Abnormal   Collection Time: 10/29/23 10:27 AM  Result Value Ref Range Status   Enterococcus faecalis NOT DETECTED NOT DETECTED Final   Enterococcus Faecium NOT  DETECTED NOT DETECTED Final   Listeria monocytogenes NOT DETECTED NOT DETECTED Final   Staphylococcus species NOT DETECTED NOT DETECTED Final   Staphylococcus aureus (BCID) NOT DETECTED NOT DETECTED Final   Staphylococcus epidermidis NOT DETECTED NOT DETECTED Final   Staphylococcus lugdunensis NOT DETECTED NOT DETECTED Final   Streptococcus species DETECTED (A) NOT DETECTED Final    Comment: CRITICAL RESULT CALLED TO, READ BACK BY AND VERIFIED WITH: PHARMD J LEDFORD 10/30/2023 @ 0440 BY AB    Streptococcus agalactiae NOT DETECTED NOT DETECTED Final   Streptococcus pneumoniae DETECTED (A) NOT DETECTED Final    Comment: CRITICAL RESULT CALLED TO, READ BACK BY AND VERIFIED WITH: PHARMD J LEDFORD 10/30/2023 @ 0440 BY AB    Streptococcus pyogenes NOT DETECTED NOT DETECTED Final   A.calcoaceticus-baumannii NOT DETECTED NOT DETECTED Final   Bacteroides fragilis NOT DETECTED NOT DETECTED Final   Enterobacterales NOT DETECTED NOT DETECTED Final   Enterobacter cloacae complex NOT DETECTED NOT DETECTED Final   Escherichia coli NOT DETECTED NOT DETECTED Final   Klebsiella aerogenes NOT DETECTED NOT DETECTED Final   Klebsiella oxytoca NOT DETECTED NOT DETECTED Final   Klebsiella pneumoniae NOT DETECTED NOT DETECTED Final   Proteus species NOT DETECTED NOT DETECTED Final   Salmonella species NOT DETECTED NOT DETECTED Final   Serratia marcescens NOT DETECTED NOT DETECTED Final   Haemophilus influenzae NOT DETECTED NOT DETECTED Final   Neisseria meningitidis NOT DETECTED NOT DETECTED Final   Pseudomonas aeruginosa NOT DETECTED NOT DETECTED Final   Stenotrophomonas maltophilia NOT DETECTED NOT DETECTED Final   Candida albicans NOT DETECTED NOT DETECTED Final   Candida auris NOT DETECTED NOT DETECTED Final   Candida glabrata NOT DETECTED NOT DETECTED Final   Candida krusei NOT DETECTED NOT DETECTED Final   Candida parapsilosis NOT DETECTED NOT DETECTED Final   Candida tropicalis NOT DETECTED NOT  DETECTED Final   Cryptococcus neoformans/gattii NOT DETECTED NOT DETECTED Final    Comment: Performed at [facility], [address], [place], [place] [address]  Aerobic/Anaerobic Culture w Gram Stain (surgical/deep wound)     Status: None (Preliminary result)   Collection Time: 10/29/23  1:13 PM   Specimen: PATH Soft tissue  Result Value Ref Range Status   Specimen Description ABSCESS  Final   Special Requests LUMBAR ABSCESS NO 1  Final   Gram Stain   Final    FEW WBC PRESENT, PREDOMINANTLY PMN RARE GRAM POSITIVE COCCI IN PAIRS Performed at [facility], [address] [place], [place] [address]    Culture   Final    FEW STREPTOCOCCUS PNEUMONIAE SUSCEPTIBILITIES PERFORMED  ON PREVIOUS CULTURE WITHIN THE LAST 5 DAYS. NO ANAEROBES ISOLATED; CULTURE IN PROGRESS FOR 5 DAYS    Report Status PENDING  Incomplete  Aerobic/Anaerobic Culture w Gram Stain (surgical/deep wound)     Status: None (Preliminary result)   Collection Time: 10/29/23  1:23 PM   Specimen: PATH Soft tissue  Result Value Ref Range Status   Specimen Description ABSCESS  Final   Special Requests LUMBAR ABSCESS NO 2  Final   Gram Stain   Final    ABUNDANT WBC PRESENT, PREDOMINANTLY PMN FEW GRAM POSITIVE COCCI IN PAIRS Performed at [facility], [address], [place], [place] [address]    Culture   Final    MODERATE STREPTOCOCCUS PNEUMONIAE NO ANAEROBES ISOLATED; CULTURE IN PROGRESS FOR 5 DAYS    Report Status PENDING  Incomplete   Organism ID, Bacteria STREPTOCOCCUS PNEUMONIAE  Final      Susceptibility   Streptococcus pneumoniae - MIC*    ERYTHROMYCIN <=0.12 SENSITIVE Sensitive     LEVOFLOXACIN 1 SENSITIVE Sensitive     VANCOMYCIN 0.5 SENSITIVE Sensitive     PENICILLIN (meningitis) <=0.06 SENSITIVE Sensitive     PENO - penicillin <=0.06      PENICILLIN (non-meningitis) <=0.06 SENSITIVE Sensitive     PENICILLIN (oral) <=0.06 SENSITIVE Sensitive     CEFTRIAXONE  (non-meningitis) <=0.12  SENSITIVE Sensitive     CEFTRIAXONE  (meningitis) <=0.12 SENSITIVE Sensitive     * MODERATE STREPTOCOCCUS PNEUMONIAE  Aerobic/Anaerobic Culture w Gram Stain (surgical/deep wound)     Status: None (Preliminary result)   Collection Time: 10/29/23  1:30 PM   Specimen: PATH Soft tissue  Result Value Ref Range Status   Specimen Description ABSCESS  Final   Special Requests LUMBAR ABSCESS NO 3  Final   Gram Stain   Final    RARE WBC PRESENT, PREDOMINANTLY PMN RARE GRAM POSITIVE COCCI IN PAIRS Performed at [facility], [address] [place], [place] [address]    Culture   Final    RARE STREPTOCOCCUS PNEUMONIAE SUSCEPTIBILITIES PERFORMED ON PREVIOUS CULTURE WITHIN THE LAST 5 DAYS. NO ANAEROBES ISOLATED; CULTURE IN PROGRESS FOR 5 DAYS    Report Status PENDING  Incomplete  Culture, blood (Routine X 2) w Reflex to ID Panel     Status: None (Preliminary result)   Collection Time: 10/30/23  5:48 PM   Specimen: BLOOD LEFT ARM  Result Value Ref Range Status   Specimen Description BLOOD LEFT ARM  Final   Special Requests   Final    BOTTLES DRAWN AEROBIC AND ANAEROBIC Blood Culture adequate volume   Culture   Final    NO GROWTH 2 DAYS Performed at [facility], [address], [place], [place] [address]    Report Status PENDING  Incomplete  Culture, blood (Routine X 2) w Reflex to ID Panel     Status: None (Preliminary result)   Collection Time: 10/30/23  6:07 PM   Specimen: BLOOD LEFT ARM  Result Value Ref Range Status   Specimen Description BLOOD LEFT ARM  Final   Special Requests   Final    AEROBIC BOTTLE ONLY Blood Culture results may not be optimal due to an inadequate volume of blood received in culture bottles   Culture   Final    NO GROWTH 2 DAYS Performed at [facility], [address] [place], [place] [address]    Report Status PENDING  Incomplete    Radiology Studies: EP STUDY Result Date: 11/01/2023  See surgical note for result.  ECHOCARDIOGRAM  COMPLETE Result Date: 10/31/2023    ECHOCARDIOGRAM REPORT   Patient Name:   Kristina Long Date of Exam: 10/31/2023 Medical Rec #:  [id number]      Height:       70.0 in Accession #:    [id number]     Weight:       87.1 lb Date of Birth:  [birth year]     BSA:          1.466 m Patient Age:    67 years       BP:           143/73 mmHg Patient Gender: F              HR:           103 bpm. Exam Location:  Inpatient Procedure: 2D Echo, Cardiac Doppler and Color Doppler (Both Spectral and Color            Flow Doppler were utilized during procedure). Indications:    Bacteremia  History:        Patient has no prior history of Echocardiogram examinations.  Sonographer:    Andrena Bang Referring Phys: Bubba Carbo  Sonographer Comments: Difficult study due to suboptimal positioning. Pt experienced severe back pain during exam. IMPRESSIONS  1. Left ventricular ejection fraction, by estimation, is 55 to 60%. The left ventricle has normal function. The left ventricle has no regional wall motion abnormalities. Left ventricular diastolic parameters were normal.  2. Right ventricular systolic function is normal. The right ventricular size is normal.  3. Left atrial size was mildly dilated.  4. The mitral valve is normal in structure. No evidence of mitral valve regurgitation. No evidence of mitral stenosis.  5. The aortic valve is tricuspid. Aortic valve regurgitation is not visualized. No aortic stenosis is present.  6. The inferior vena cava is normal in size with greater than 50% respiratory variability, suggesting right atrial pressure of 3 mmHg. Conclusion(s)/Recommendation(s): No evidence of valvular vegetations on this transthoracic echocardiogram. Consider a transesophageal echocardiogram to exclude infective endocarditis if clinically indicated. FINDINGS  Left Ventricle: Left ventricular ejection fraction, by estimation, is 55 to 60%. The left ventricle has normal function. The left ventricle has no regional wall motion  abnormalities. The left ventricular internal cavity size was normal in size. There is  no left ventricular hypertrophy. Left ventricular diastolic parameters were normal. Right Ventricle: The right ventricular size is normal. No increase in right ventricular wall thickness. Right ventricular systolic function is normal. Left Atrium: Left atrial size was mildly dilated. Right Atrium: Right atrial size was normal in size. Pericardium: There is no evidence of pericardial effusion. Mitral Valve: The mitral valve is normal in structure. No evidence of mitral valve regurgitation. No evidence of mitral valve stenosis. Tricuspid Valve: The tricuspid valve is normal in structure. Tricuspid valve regurgitation is not demonstrated. No evidence of tricuspid stenosis. Aortic Valve: The aortic valve is tricuspid. Aortic valve regurgitation is not visualized. No aortic stenosis is present. Aortic valve mean gradient measures 4.0 mmHg. Aortic valve peak gradient measures 9.0 mmHg. Aortic valve area, by VTI measures 2.99 cm. Pulmonic Valve: The pulmonic valve was normal in structure. Pulmonic valve regurgitation is not visualized. No evidence of pulmonic stenosis. Aorta: The aortic root is normal in size and structure. Venous: The inferior vena cava is normal in size with greater than 50% respiratory variability, suggesting right atrial pressure of 3 mmHg. IAS/Shunts: No atrial level  shunt detected by color flow Doppler.  LEFT VENTRICLE PLAX 2D LVIDd:         4.80 cm      Diastology LVIDs:         3.50 cm      LV e' lateral:   12.30 cm/s LV PW:         1.00 cm      LV E/e' lateral: 7.3 LV IVS:        0.80 cm LVOT diam:     2.10 cm LV SV:         74 LV SV Index:   51 LVOT Area:     3.46 cm  LV Volumes (MOD) LV vol d, MOD A2C: 78.6 ml LV vol d, MOD A4C: 120.0 ml LV vol s, MOD A2C: 22.5 ml LV vol s, MOD A4C: 71.9 ml LV SV MOD A2C:     56.1 ml LV SV MOD A4C:     120.0 ml LV SV MOD BP:      57.2 ml RIGHT VENTRICLE RV S prime:     20.50  cm/s TAPSE (M-mode): 1.7 cm LEFT ATRIUM             Index LA Vol (A2C):   35.3 ml 24.08 ml/m LA Vol (A4C):   55.9 ml 38.13 ml/m LA Biplane Vol: 46.5 ml 31.72 ml/m  AORTIC VALVE AV Area (Vmax):    2.52 cm AV Area (Vmean):   2.25 cm AV Area (VTI):     2.99 cm AV Vmax:           150.00 cm/s AV Vmean:          97.400 cm/s AV VTI:            0.249 m AV Peak Grad:      9.0 mmHg AV Mean Grad:      4.0 mmHg LVOT Vmax:         109.00 cm/s LVOT Vmean:        63.300 cm/s LVOT VTI:          0.215 m LVOT/AV VTI ratio: 0.86  AORTA Ao Asc diam: 3.00 cm MITRAL VALVE MV Area (PHT): 4.99 cm    SHUNTS MV Decel Time: 152 msec    Systemic VTI:  0.22 m MV E velocity: 89.60 cm/s  Systemic Diam: 2.10 cm MV A velocity: 73.40 cm/s MV E/A ratio:  1.22 Dorothye Gathers MD Electronically signed by Dorothye Gathers MD Signature Date/Time: 10/31/2023/5:08:32 PM    Final        Augustino Savastano T. Laurrie Toppin [facility]  If 7PM-7AM, please contact night-coverage [email or web address] 11/01/2023, 2:29 PM

## 2023-11-01 NOTE — Anesthesia Preprocedure Evaluation (Addendum)
 Anesthesia Evaluation  Patient identified by MRN, date of birth, ID band Patient awake    Reviewed: Allergy & Precautions, NPO status , Patient's Chart, lab work & pertinent test results  History of Anesthesia Complications Negative for: history of anesthetic complications  Airway Mallampati: III  TM Distance: >3 FB Neck ROM: Full    Dental  (+) Dental Advisory Given   Pulmonary neg shortness of breath, neg sleep apnea, neg COPD, neg recent URI   breath sounds clear to auscultation       Cardiovascular negative cardio ROS Normal cardiovascular exam Rhythm:Regular Rate:Normal  Echo 10/31/2023  1. Left ventricular ejection fraction, by estimation, is 55 to 60%. The left ventricle has normal function. The left ventricle has no regional wall motion abnormalities. Left ventricular diastolic parameters were normal.   2. Right ventricular systolic function is normal. The right ventricular size is normal.   3. Left atrial size was mildly dilated.   4. The mitral valve is normal in structure. No evidence of mitral valve regurgitation. No evidence of mitral stenosis.   5. The aortic valve is tricuspid. Aortic valve regurgitation is not visualized. No aortic stenosis is present.   6. The inferior vena cava is normal in size with greater than 50% respiratory variability, suggesting right atrial pressure of 3 mmHg.   Conclusion(s)/Recommendation(s): No evidence of valvular vegetations on this transthoracic echocardiogram. Consider a transesophageal echocardiogram to exclude infective endocarditis if clinically indicated.     Neuro/Psych    GI/Hepatic negative GI ROS, Neg liver ROS,,,  Endo/Other  diabetes, Poorly Controlled  Lab Results      Component                Value               Date                      HGBA1C                   8.7 (H)             10/26/2023             Renal/GU Lab Results      Component                Value                Date                      NA                       134 (L)             10/29/2023                K                        3.6                 10/29/2023                CO2                      25                  10/29/2023  GLUCOSE                  320 (H)             10/29/2023                BUN                      31 (H)              10/29/2023                CREATININE               0.75                10/29/2023                CALCIUM                   9.4                 10/29/2023                GFR                      83.54               01/03/2022                GFRNONAA                 >60                 10/29/2023                Musculoskeletal negative musculoskeletal ROS (+)    Abdominal   Peds  Hematology  (+) Blood dyscrasia, anemia Lab Results      Component                Value               Date                      WBC                      18.5 (H)            10/29/2023                HGB                      11.6 (L)            10/29/2023                HCT                      35.0 (L)            10/29/2023                MCV                      82.9                10/29/2023                PLT  342                 10/29/2023              Anesthesia Other Findings   Reproductive/Obstetrics                             Anesthesia Physical Anesthesia Plan  ASA: 3  Anesthesia Plan: MAC   Post-op Pain Management: Minimal or no pain anticipated   Induction: Intravenous  PONV Risk Score and Plan: 2 and Propofol infusion, TIVA and Treatment may vary due to age or medical condition  Airway Management Planned:   Additional Equipment:   Intra-op Plan:   Post-operative Plan:   Informed Consent: I have reviewed the patients History and Physical, chart, labs and discussed the procedure including the risks, benefits and alternatives for the proposed anesthesia with the patient or  authorized representative who has indicated his/her understanding and acceptance.     Dental advisory given  Plan Discussed with: CRNA  Anesthesia Plan Comments:        Anesthesia Quick Evaluation

## 2023-11-01 NOTE — Inpatient Diabetes Management (Signed)
 Inpatient Diabetes Program Recommendations  AACE/ADA: New Consensus Statement on Inpatient Glycemic Control (2015)  Target Ranges:  Prepandial:   less than 140 mg/dL      Peak postprandial:   less than 180 mg/dL (1-2 hours)      Critically ill patients:  140 - 180 mg/dL   Lab Results  Component Value Date   GLUCAP 179 (H) 11/01/2023   HGBA1C 8.7 (H) 10/26/2023    Review of Glycemic Control  Latest Reference Range & Units 10/31/23 08:15 10/31/23 10:59 10/31/23 13:33 10/31/23 16:39 10/31/23 21:16 11/01/23 06:34 11/01/23 11:07  Glucose-Capillary 70 - 99 mg/dL 956 (H) 213 (H) 086 (H) 321 (H) 180 (H) 260 (H) 179 (H)   Diabetes history: DM 2 Outpatient Diabetes medications:  Januvia  100 mg daily, Metformin  1000 mg bid Current orders for Inpatient glycemic control:  Novolog 0-15 units tid with meals and HS Semglee 30 units daily Novolog 7 units tid with meals  Tradjenta 5 mg Daily A1c 8.7% on 5/2  Inpatient Diabetes Program Recommendations:    -   Consider increasing Semglee to 35 units  Thanks,  Eloise Hake RN, MSN, BC-ADM Inpatient Diabetes Coordinator Team Pager [phone] (8a-5p)

## 2023-11-02 ENCOUNTER — Encounter (HOSPITAL_COMMUNITY): Payer: Self-pay | Admitting: Cardiology

## 2023-11-02 ENCOUNTER — Inpatient Hospital Stay (HOSPITAL_COMMUNITY)

## 2023-11-02 DIAGNOSIS — E119 Type 2 diabetes mellitus without complications: Secondary | ICD-10-CM | POA: Diagnosis not present

## 2023-11-02 DIAGNOSIS — I339 Acute and subacute endocarditis, unspecified: Secondary | ICD-10-CM

## 2023-11-02 DIAGNOSIS — R7881 Bacteremia: Secondary | ICD-10-CM

## 2023-11-02 DIAGNOSIS — B953 Streptococcus pneumoniae as the cause of diseases classified elsewhere: Secondary | ICD-10-CM | POA: Diagnosis not present

## 2023-11-02 DIAGNOSIS — I340 Nonrheumatic mitral (valve) insufficiency: Secondary | ICD-10-CM

## 2023-11-02 DIAGNOSIS — I330 Acute and subacute infective endocarditis: Secondary | ICD-10-CM | POA: Diagnosis not present

## 2023-11-02 DIAGNOSIS — G061 Intraspinal abscess and granuloma: Secondary | ICD-10-CM | POA: Diagnosis not present

## 2023-11-02 LAB — GLUCOSE, CAPILLARY
Glucose-Capillary: 228 mg/dL — ABNORMAL HIGH (ref 70–99)
Glucose-Capillary: 165 mg/dL — ABNORMAL HIGH (ref 70–99)
Glucose-Capillary: 169 mg/dL — ABNORMAL HIGH (ref 70–99)
Glucose-Capillary: 265 mg/dL — ABNORMAL HIGH (ref 70–99)

## 2023-11-02 LAB — CBC
HCT: 29.2 % — ABNORMAL LOW (ref 36.0–46.0)
Hemoglobin: 9.3 g/dL — ABNORMAL LOW (ref 12.0–15.0)
MCH: 27 pg (ref 26.0–34.0)
MCHC: 31.8 g/dL (ref 30.0–36.0)
MCV: 84.6 fL (ref 80.0–100.0)
Platelets: 315 10*3/uL (ref 150–400)
RBC: 3.45 MIL/uL — ABNORMAL LOW (ref 3.87–5.11)
RDW: 14.4 % (ref 11.5–15.5)
WBC: 13 10*3/uL — ABNORMAL HIGH (ref 4.0–10.5)
nRBC: 0 % (ref 0.0–0.2)

## 2023-11-02 LAB — RENAL FUNCTION PANEL
Albumin: 2 g/dL — ABNORMAL LOW (ref 3.5–5.0)
Anion gap: 13 (ref 5–15)
BUN: 16 mg/dL (ref 8–23)
CO2: 25 mmol/L (ref 22–32)
Calcium: 8.7 mg/dL — ABNORMAL LOW (ref 8.9–10.3)
Chloride: 98 mmol/L (ref 98–111)
Creatinine, Ser: 0.74 mg/dL (ref 0.44–1.00)
GFR, Estimated: >60 mL/min (ref >60)
Glucose, Bld: 226 mg/dL — ABNORMAL HIGH (ref 70–99)
Phosphorus: 3.7 mg/dL (ref 2.5–4.6)
Potassium: 4.2 mmol/L (ref 3.5–5.1)
Sodium: 136 mmol/L (ref 135–145)

## 2023-11-02 LAB — MAGNESIUM: Magnesium: 2 mg/dL (ref 1.7–2.4)

## 2023-11-02 NOTE — Progress Notes (Signed)
 [facility]  Date of Admission:  10/26/2023     Reason for Follow Up: Low back pain  Total days of antibiotics 5         ASSESSMENT:  [name] was positive for mitral valve vegetation consistent with endocarditis in the setting of epidural abscess and Streptococcus pneumoniae bacteremia.  Discussed plan of care to continue with current dose of ceftriaxone  and await evaluation by CVTS to ensure no surgical interventions are indicated.  Tolerating antibiotics with no adverse side effects.  Blood cultures on 10/30/2023 remain without growth to date.  Continue postsurgical wound care per neurosurgery.  Standard/universal precautions.  Remaining medical and supportive care per internal medicine.   PLAN:  Continue current dose of ceftriaxone . Monitor cultures for clearance of bacteremia. CVTS evaluation for any surgical interventions Postoperative wound care per neurosurgery. Standard/universal precautions. Remaining medical and supportive care per internal medicine.  Principal Problem:   Low back pain Active Problems:   Type 2 diabetes mellitus without complication, without long-term current use of insulin  (HCC)   Dyslipidemia   Right flank pain   Acute encephalopathy   Epidural abscess   S/P lumbar laminectomy   Bacteremia due to Streptococcus pneumoniae   Endocarditis, valve unspecified   Bacteremia    acetaminophen   1,000 mg Oral Q6H WA   amLODipine   10 mg Oral Daily   atorvastatin   10 mg Oral QHS   bisacodyl   10 mg Rectal Daily   celecoxib   200 mg Oral Q12H   insulin  aspart  0-20 Units Subcutaneous TID WC   insulin  aspart  0-5 Units Subcutaneous QHS   insulin  aspart  7 Units Subcutaneous TID WC   insulin  glargine-yfgn  30 Units Subcutaneous Daily   linagliptin   5 mg Oral Daily   lisinopril   5 mg Oral Daily   senna-docusate  2 tablet Oral BID   sodium chloride  flush  3 mL Intravenous Q12H    SUBJECTIVE:  Afebrile overnight with  no acute events.  Leukocytosis improving.  Husband visiting at bedside.  Tolerating antibiotics with no adverse side effects.  Allergies  Allergen Reactions   Blueberry [Vaccinium Angustifolium] Swelling and Other (See Comments)    Lips swell     Review of Systems: Review of Systems  Constitutional:  Negative for chills, fever and weight loss.  Respiratory:  Negative for cough, shortness of breath and wheezing.   Cardiovascular:  Negative for chest pain and leg swelling.  Gastrointestinal:  Negative for abdominal pain, constipation, diarrhea, nausea and vomiting.  Skin:  Negative for rash.      OBJECTIVE: Vitals:   11/01/23 1441 11/01/23 2004 11/02/23 0504 11/02/23 0732  BP: (!) 141/66 137/78 (!) 143/98 130/74  Pulse: 94 94 85 80  Resp: 18 18 18 19   Temp: 97.7 F (36.5 C) 98.9 F (37.2 C) 98 F (36.7 C) 98 F (36.7 C)  TempSrc: Oral Oral Oral   SpO2: 97% 100% 97% 98%  Weight:      Height:       Body mass index is 27.41 kg/m.  Physical Exam Constitutional:      General: She is not in acute distress.    Appearance: She is well-developed.  Cardiovascular:     Rate and Rhythm: Normal rate and regular rhythm.     Heart sounds: Normal heart sounds.  Pulmonary:     Effort: Pulmonary effort is normal.     Breath sounds: Normal breath sounds.  Skin:  General: Skin is warm and dry.  Neurological:     Mental Status: She is alert and oriented to person, place, and time.     Lab Results Lab Results  Component Value Date   WBC 13.0 (H) 11/02/2023   HGB 9.3 (L) 11/02/2023   HCT 29.2 (L) 11/02/2023   MCV 84.6 11/02/2023   PLT 315 11/02/2023    Lab Results  Component Value Date   CREATININE 0.74 11/02/2023   BUN 16 11/02/2023   NA 136 11/02/2023   K 4.2 11/02/2023   CL 98 11/02/2023   CO2 25 11/02/2023    Lab Results  Component Value Date   ALT 29 10/26/2023   AST 27 10/26/2023   ALKPHOS 89 10/26/2023   BILITOT 1.3 (H) 10/26/2023      Microbiology: Recent Results (from the past 240 hours)  Blood Culture (routine x 2)     Status: None   Collection Time: 10/24/23  3:12 PM   Specimen: BLOOD LEFT ARM  Result Value Ref Range Status   Specimen Description   Final    BLOOD LEFT ARM Performed at [facility], [address], [place], [place] [address]    Special Requests   Final    BOTTLES DRAWN AEROBIC AND ANAEROBIC Blood Culture results may not be optimal due to an inadequate volume of blood received in culture bottles Performed at [facility], [address] [place], [place] [address]    Culture   Final    NO GROWTH 5 DAYS Performed at [facility], [address], [place], [place] [address]    Report Status 10/29/2023 FINAL  Final  Blood Culture (routine x 2)     Status: None   Collection Time: 10/24/23  3:17 PM   Specimen: BLOOD RIGHT ARM  Result Value Ref Range Status   Specimen Description   Final    BLOOD RIGHT ARM Performed at [facility], [address] [place], [place] [address]    Special Requests   Final    BOTTLES DRAWN AEROBIC AND ANAEROBIC Blood Culture results may not be optimal due to an inadequate volume of blood received in culture bottles Performed at [facility], [address], [place], [place] [address]    Culture   Final    NO GROWTH 5 DAYS Performed at [facility], [address] [place], [place] [address]    Report Status 10/29/2023 FINAL  Final  Resp panel by RT-PCR (RSV, Flu A&B, Covid) Anterior Nasal Swab     Status: None   Collection Time: 10/24/23  3:57 PM   Specimen: Anterior Nasal Swab  Result Value Ref Range Status   SARS Coronavirus 2 by RT PCR NEGATIVE NEGATIVE Final    Comment: (NOTE) SARS-CoV-2 target nucleic acids are NOT DETECTED.  The SARS-CoV-2 RNA is generally detectable in upper respiratory specimens during the acute phase of infection. The lowest concentration of SARS-CoV-2 viral copies this  assay can detect is 138 copies/mL. A negative result does not preclude SARS-Cov-2 infection and should not be used as the sole basis for treatment or other patient management decisions. A negative result may occur with  improper specimen collection/handling, submission of specimen other than nasopharyngeal swab, presence of viral mutation(s) within the areas targeted by this assay, and inadequate number of viral copies(<138 copies/mL). A negative result must be combined with clinical observations, patient history, and epidemiological information. The expected result is Negative.  Fact Sheet for Patients:  BloggerCourse.com  Fact Sheet for Healthcare Providers:  SeriousBroker.it  This test is no t yet approved or cleared by the [place]  FDA and  has been authorized for detection and/or diagnosis of SARS-CoV-2 by FDA under an Emergency Use Authorization (EUA). This EUA will remain  in effect (meaning this test can be used) for the duration of the COVID-19 declaration under Section 564(b)(1) of the Act, 21 U.S.C.section 360bbb-3(b)(1), unless the authorization is terminated  or revoked sooner.       Influenza A by PCR NEGATIVE NEGATIVE Final   Influenza B by PCR NEGATIVE NEGATIVE Final    Comment: (NOTE) The Xpert Xpress SARS-CoV-2/FLU/RSV plus assay is intended as an aid in the diagnosis of influenza from Nasopharyngeal swab specimens and should not be used as a sole basis for treatment. Nasal washings and aspirates are unacceptable for Xpert Xpress SARS-CoV-2/FLU/RSV testing.  Fact Sheet for Patients: BloggerCourse.com  Fact Sheet for Healthcare Providers: SeriousBroker.it  This test is not yet approved or cleared by the [place]  FDA and has been authorized for detection and/or diagnosis of SARS-CoV-2 by FDA under an Emergency Use Authorization (EUA). This EUA will  remain in effect (meaning this test can be used) for the duration of the COVID-19 declaration under Section 564(b)(1) of the Act, 21 U.S.C. section 360bbb-3(b)(1), unless the authorization is terminated or revoked.     Resp Syncytial Virus by PCR NEGATIVE NEGATIVE Final    Comment: (NOTE) Fact Sheet for Patients: BloggerCourse.com  Fact Sheet for Healthcare Providers: SeriousBroker.it  This test is not yet approved or cleared by the [place]  FDA and has been authorized for detection and/or diagnosis of SARS-CoV-2 by FDA under an Emergency Use Authorization (EUA). This EUA will remain in effect (meaning this test can be used) for the duration of the COVID-19 declaration under Section 564(b)(1) of the Act, 21 U.S.C. section 360bbb-3(b)(1), unless the authorization is terminated or revoked.  Performed at [facility], [address] [place], [place] [address]   Surgical pcr screen     Status: None   Collection Time: 10/29/23 10:02 AM   Specimen: Nasal Mucosa; Nasal Swab  Result Value Ref Range Status   MRSA, PCR NEGATIVE NEGATIVE Final   Staphylococcus aureus NEGATIVE NEGATIVE Final    Comment: (NOTE) The Xpert SA Assay (FDA approved for NASAL specimens in patients 36 years of age and older), is one component of a comprehensive surveillance program. It is not intended to diagnose infection nor to guide or monitor treatment. Performed at [facility], [address] [place], [place] [address]   Culture, blood (Routine X 2) w Reflex to ID Panel     Status: None (Preliminary result)   Collection Time: 10/29/23 10:18 AM   Specimen: BLOOD RIGHT ARM  Result Value Ref Range Status   Specimen Description BLOOD RIGHT ARM  Final   Special Requests   Final    BOTTLES DRAWN AEROBIC AND ANAEROBIC Blood Culture adequate volume   Culture   Final    NO GROWTH 4 DAYS Performed at [facility],  [address] [place], [place] [address]    Report Status PENDING  Incomplete  Culture, blood (Routine X 2) w Reflex to ID Panel     Status: Abnormal   Collection Time: 10/29/23 10:27 AM   Specimen: BLOOD LEFT ARM  Result Value Ref Range Status   Specimen Description BLOOD LEFT ARM  Final   Special Requests  Final    BOTTLES DRAWN AEROBIC AND ANAEROBIC BACTEROIDES CACCAE   Culture  Setup Time   Final    GRAM POSITIVE COCCI IN PAIRS IN CHAINS ANAEROBIC BOTTLE ONLY CRITICAL RESULT CALLED TO, READ BACK BY AND VERIFIED WITH: PHARMD J LEDFORD 10/30/2023 @ 0440 BY AB Performed at [facility], [address], [place], [place] [address]    Culture STREPTOCOCCUS PNEUMONIAE (A)  Final   Report Status 11/01/2023 FINAL  Final   Organism ID, Bacteria STREPTOCOCCUS PNEUMONIAE  Final      Susceptibility   Streptococcus pneumoniae - MIC*    ERYTHROMYCIN <=0.12 SENSITIVE Sensitive     LEVOFLOXACIN 1 SENSITIVE Sensitive     VANCOMYCIN  0.5 SENSITIVE Sensitive     PENICILLIN (meningitis) <=0.06 SENSITIVE Sensitive     PENO - penicillin <=0.06      PENICILLIN (non-meningitis) <=0.06 SENSITIVE Sensitive     PENICILLIN (oral) <=0.06 SENSITIVE Sensitive     CEFTRIAXONE  (non-meningitis) <=0.12 SENSITIVE Sensitive     CEFTRIAXONE  (meningitis) <=0.12 SENSITIVE Sensitive     * STREPTOCOCCUS PNEUMONIAE  Blood Culture ID Panel (Reflexed)     Status: Abnormal   Collection Time: 10/29/23 10:27 AM  Result Value Ref Range Status   Enterococcus faecalis NOT DETECTED NOT DETECTED Final   Enterococcus Faecium NOT DETECTED NOT DETECTED Final   Listeria monocytogenes NOT DETECTED NOT DETECTED Final   Staphylococcus species NOT DETECTED NOT DETECTED Final   Staphylococcus aureus (BCID) NOT DETECTED NOT DETECTED Final   Staphylococcus epidermidis NOT DETECTED NOT DETECTED Final   Staphylococcus lugdunensis NOT DETECTED NOT DETECTED Final   Streptococcus species DETECTED (A) NOT DETECTED Final    Comment:  CRITICAL RESULT CALLED TO, READ BACK BY AND VERIFIED WITH: PHARMD J LEDFORD 10/30/2023 @ 0440 BY AB    Streptococcus agalactiae NOT DETECTED NOT DETECTED Final   Streptococcus pneumoniae DETECTED (A) NOT DETECTED Final    Comment: CRITICAL RESULT CALLED TO, READ BACK BY AND VERIFIED WITH: PHARMD J LEDFORD 10/30/2023 @ 0440 BY AB    Streptococcus pyogenes NOT DETECTED NOT DETECTED Final   A.calcoaceticus-baumannii NOT DETECTED NOT DETECTED Final   Bacteroides fragilis NOT DETECTED NOT DETECTED Final   Enterobacterales NOT DETECTED NOT DETECTED Final   Enterobacter cloacae complex NOT DETECTED NOT DETECTED Final   Escherichia coli NOT DETECTED NOT DETECTED Final   Klebsiella aerogenes NOT DETECTED NOT DETECTED Final   Klebsiella oxytoca NOT DETECTED NOT DETECTED Final   Klebsiella pneumoniae NOT DETECTED NOT DETECTED Final   Proteus species NOT DETECTED NOT DETECTED Final   Salmonella species NOT DETECTED NOT DETECTED Final   Serratia marcescens NOT DETECTED NOT DETECTED Final   Haemophilus influenzae NOT DETECTED NOT DETECTED Final   Neisseria meningitidis NOT DETECTED NOT DETECTED Final   Pseudomonas aeruginosa NOT DETECTED NOT DETECTED Final   Stenotrophomonas maltophilia NOT DETECTED NOT DETECTED Final   Candida albicans NOT DETECTED NOT DETECTED Final   Candida auris NOT DETECTED NOT DETECTED Final   Candida glabrata NOT DETECTED NOT DETECTED Final   Candida krusei NOT DETECTED NOT DETECTED Final   Candida parapsilosis NOT DETECTED NOT DETECTED Final   Candida tropicalis NOT DETECTED NOT DETECTED Final   Cryptococcus neoformans/gattii NOT DETECTED NOT DETECTED Final    Comment: Performed at [facility], [address] [place], [place] [address]  Aerobic/Anaerobic Culture w Gram Stain (surgical/deep wound)     Status: None (Preliminary result)   Collection Time: 10/29/23  1:13 PM   Specimen: PATH  Soft tissue  Result Value Ref Range Status   Specimen Description ABSCESS   Final   Special Requests LUMBAR ABSCESS NO 1  Final   Gram Stain   Final    FEW WBC PRESENT, PREDOMINANTLY PMN RARE GRAM POSITIVE COCCI IN PAIRS Performed at [facility], [address] [place], [place] [address]    Culture   Final    FEW STREPTOCOCCUS PNEUMONIAE SUSCEPTIBILITIES PERFORMED ON PREVIOUS CULTURE WITHIN THE LAST 5 DAYS. NO ANAEROBES ISOLATED; CULTURE IN PROGRESS FOR 5 DAYS    Report Status PENDING  Incomplete  Aerobic/Anaerobic Culture w Gram Stain (surgical/deep wound)     Status: None (Preliminary result)   Collection Time: 10/29/23  1:23 PM   Specimen: PATH Soft tissue  Result Value Ref Range Status   Specimen Description ABSCESS  Final   Special Requests LUMBAR ABSCESS NO 2  Final   Gram Stain   Final    ABUNDANT WBC PRESENT, PREDOMINANTLY PMN FEW GRAM POSITIVE COCCI IN PAIRS Performed at [facility], [address], [place], [place] [address]    Culture   Final    MODERATE STREPTOCOCCUS PNEUMONIAE NO ANAEROBES ISOLATED; CULTURE IN PROGRESS FOR 5 DAYS    Report Status PENDING  Incomplete   Organism ID, Bacteria STREPTOCOCCUS PNEUMONIAE  Final      Susceptibility   Streptococcus pneumoniae - MIC*    ERYTHROMYCIN <=0.12 SENSITIVE Sensitive     LEVOFLOXACIN 1 SENSITIVE Sensitive     VANCOMYCIN  0.5 SENSITIVE Sensitive     PENICILLIN (meningitis) <=0.06 SENSITIVE Sensitive     PENO - penicillin <=0.06      PENICILLIN (non-meningitis) <=0.06 SENSITIVE Sensitive     PENICILLIN (oral) <=0.06 SENSITIVE Sensitive     CEFTRIAXONE  (non-meningitis) <=0.12 SENSITIVE Sensitive     CEFTRIAXONE  (meningitis) <=0.12 SENSITIVE Sensitive     * MODERATE STREPTOCOCCUS PNEUMONIAE  Aerobic/Anaerobic Culture w Gram Stain (surgical/deep wound)     Status: None (Preliminary result)   Collection Time: 10/29/23  1:30 PM   Specimen: PATH Soft tissue  Result Value Ref Range Status   Specimen Description ABSCESS  Final   Special Requests LUMBAR ABSCESS NO 3  Final    Gram Stain   Final    RARE WBC PRESENT, PREDOMINANTLY PMN RARE GRAM POSITIVE COCCI IN PAIRS Performed at [facility], [address], [place], [place] [address]    Culture   Final    RARE STREPTOCOCCUS PNEUMONIAE SUSCEPTIBILITIES PERFORMED ON PREVIOUS CULTURE WITHIN THE LAST 5 DAYS. NO ANAEROBES ISOLATED; CULTURE IN PROGRESS FOR 5 DAYS    Report Status PENDING  Incomplete  Culture, blood (Routine X 2) w Reflex to ID Panel     Status: None (Preliminary result)   Collection Time: 10/30/23  5:48 PM   Specimen: BLOOD LEFT ARM  Result Value Ref Range Status   Specimen Description BLOOD LEFT ARM  Final   Special Requests   Final    BOTTLES DRAWN AEROBIC AND ANAEROBIC Blood Culture adequate volume   Culture   Final    NO GROWTH 3 DAYS Performed at [facility], [address], [place], [place] [address]    Report Status PENDING  Incomplete  Culture, blood (Routine X 2) w Reflex to ID Panel     Status: None (Preliminary result)   Collection Time: 10/30/23  6:07 PM   Specimen: BLOOD LEFT ARM  Result Value Ref Range Status   Specimen Description BLOOD LEFT ARM  Final  Special Requests   Final    AEROBIC BOTTLE ONLY Blood Culture results may not be optimal due to an inadequate volume of blood received in culture bottles   Culture   Final    NO GROWTH 3 DAYS Performed at [facility], [address] [place], [place] [address]    Report Status PENDING  Incomplete     Marlan Silva, NP [facility]  11/02/2023  2:48 PM

## 2023-11-02 NOTE — Progress Notes (Signed)
 PROGRESS NOTE  Kristina Long GNF:[id number] DOB: Oct 16, 1955   PCP: Arlys Berke, MD  Patient is from: Home.  Lives with husband.  Independently ambulates at baseline.  DOA: 10/26/2023 LOS: 6  Chief complaints Chief Complaint  Patient presents with   Drug Overdose     Brief Narrative / Interim history: As per prior documentation: "68 year old F with PMH of DM-2 returning to ED for the third time in a week with right back and flank pain and found to have epidural abscess and Streptococcus bacteremia.  MRI thoracic/lumbar spine concerning for epidural abscess extending from T11-L5 with resultant severe canal stenosis at L1-L2 and L2-L3 and moderate canal stenosis at L3-L4.    Patient underwent decompressive lumbar laminectomy, [name]  Blood and abscess culture with Streptococcus pneumoniae (was negative on 4/30).  Antibiotic de-escalated to ceftriaxone  per ID.  Repeat blood cultures NGTD.  TTE without significant finding.  Plan for TEE today".  11/02/2023: TEE revealed mitral valve endocarditis.  Cardiothoracic surgery team has been consulted.  Patient remains afebrile, and stable.  Resolving leukocytosis.  Seen alongside patient's husband and cousin.  All questions answered.   Subjective: No complaints.  Objective: Vitals:   11/01/23 1441 11/01/23 2004 11/02/23 0504 11/02/23 0732  BP: (!) 141/66 137/78 (!) 143/98 130/74  Pulse: 94 94 85 80  Resp: 18 18 18 19   Temp: 97.7 F (36.5 C) 98.9 F (37.2 C) 98 F (36.7 C) 98 F (36.7 C)  TempSrc: Oral Oral Oral   SpO2: 97% 100% 97% 98%  Weight:      Height:        Examination:  GENERAL: No apparent distress.  Awake and alert. HEENT: Mild pallor. NECK: Supple. .  Neuro: Awake and alert. RESP: Clear to auscultation. CVS: S1-S2. ABD/GI/GU: Obese, soft and nontender..  MSK/EXT: No leg edema..  Consultants:  Infectious  disease. Cardiology for TEE. Neurosurgery to drain epidural abscess Cardiothoracic surgery for mitral valve endocarditis  Procedures: 5/5-decompressive lumbar laminectomy, medial facetectomy and foraminostomies from T12-L3 with evacuation of epidural abscess by Dr. Rochelle Chu   Microbiology summarized: 4/30-blood cultures NGTD 5/5-blood culture with Streptococcus pneumoniae 5/5-abscess culture with Streptococcus pneumoniae 5/6-repeat blood cultures NGTD  Assessment and plan: Epidural abscess with severe canal stenosis Streptococcus pneumoniae bacteremia Severe lower back due to the above -Presents with lower back pain radiating to right groin for days. -MRI thoracic or lumbar spine as above -CRP and ESR markedly elevated. -S/p surgical decompression by Dr. Rochelle Chu on 5/5 -TTE without significant finding. -Plan for TEE today -Pain and glycemic control -Bowel regimen to counteract constipation -PT/OT-, and CIR.  CIR following. -Appreciate help by neurosurgery and ID 11/02/2023: Continue antibiotics.  Acute encephalopathy/delirium: Some confusion overnight.  Well-oriented this morning. -Minimize sedating medications -Reorientation and delirium precaution -Needs safety sitter intermittently. 11/02/2023: Resolved.  Mitral valve endocarditis: - TEE reveals mitral valve vegetation. -Cardiothoracic surgery team consulted. - Continue antibiotics.  Uncontrolled NIDDM-2 with hyperglycemia: A1c 8.7%.  On metformin  and Jardiance  at home Recent Labs  Lab 11/01/23 1107 11/01/23 1638 11/01/23 2144 11/02/23 0610 11/02/23 1148  GLUCAP 179* 239* 201* 228* 165*  -Continue [name]-resistant scale -Continue Semglee  30 units daily -Continue NovoLog  7 units 3 times daily with meals -Add Tradjenta  instead of home Januvia  -Further adjustment as appropriate  Essential hypertension:  -Continue amlodipine -started here - Resume home lisinopril  5 mg daily - Pain control 11/02/2023: Blood pressure is  controlled.  Proteinuria -  Continue home lisinopril   Leukocytosis/bandemia: Likely due to the above.  Improving. - Antibiotics per ID 11/02/2023: Resolving.  Hyponatremia: Mild - Continue monitoring -25: Resolved.  Body mass index is 27.41 kg/m.           DVT prophylaxis:  SCD's Start: 10/29/23 1856 Pharmacologic VTE prophylaxis once okay from surgical standpoint  Code Status: Full code Family Communication: Husband and cousin at bedside. Level of care: Med-Surg Status is: Inpatient Remains inpatient appropriate because: Epidural abscess and bacteremia   Final disposition: CIR?   55 minutes with more than 50% spent in reviewing records, counseling patient/family and coordinating care.   Sch Meds:  Scheduled Meds:  acetaminophen   1,000 mg Oral Q6H WA   amLODipine   10 mg Oral Daily   atorvastatin   10 mg Oral QHS   bisacodyl   10 mg Rectal Daily   celecoxib   200 mg Oral Q12H   insulin  aspart  0-20 Units Subcutaneous TID WC   insulin  aspart  0-5 Units Subcutaneous QHS   insulin  aspart  7 Units Subcutaneous TID WC   insulin  glargine-yfgn  30 Units Subcutaneous Daily   linagliptin   5 mg Oral Daily   lisinopril   5 mg Oral Daily   senna-docusate  2 tablet Oral BID   sodium chloride  flush  3 mL Intravenous Q12H   Continuous Infusions:  cefTRIAXone  (ROCEPHIN )  IV 2 g (11/02/23 0506)   PRN Meds:.haloperidol  lactate, HYDROmorphone  (DILAUDID ) injection, labetalol , loratadine , magnesium  citrate, menthol -cetylpyridinium **OR** phenol, methocarbamol  **OR** methocarbamol  (ROBAXIN ) injection, ondansetron  **OR** ondansetron  (ZOFRAN ) IV, mouth rinse, oxyCODONE , [COMPLETED] polyethylene glycol **FOLLOWED BY** polyethylene glycol, sodium chloride  flush  Antimicrobials: Anti-infectives (From admission, onward)    Start     Dose/Rate Route Frequency Ordered Stop   10/31/23 0600  cefTRIAXone  (ROCEPHIN ) 2 g in sodium chloride  0.9 % 100 mL IVPB        2 g 200 mL/hr over 30 Minutes  Intravenous Every 24 hours 10/30/23 1012     10/30/23 1000  vancomycin  (VANCOREADY) IVPB 500 mg/100 mL  Status:  Discontinued        500 mg 100 mL/hr over 60 Minutes Intravenous Every 24 hours 10/29/23 1915 10/30/23 1012   10/30/23 1000  cefTRIAXone  (ROCEPHIN ) 2 g in sodium chloride  0.9 % 100 mL IVPB  Status:  Discontinued        2 g 200 mL/hr over 30 Minutes Intravenous Every 12 hours 10/29/23 2202 10/29/23 2203   10/30/23 0600  cefTRIAXone  (ROCEPHIN ) 2 g in sodium chloride  0.9 % 100 mL IVPB  Status:  Discontinued        2 g 200 mL/hr over 30 Minutes Intravenous Every 12 hours 10/30/23 0457 10/30/23 1012   10/29/23 2300  metroNIDAZOLE  (FLAGYL ) IVPB 500 mg  Status:  Discontinued        500 mg 100 mL/hr over 60 Minutes Intravenous Every 12 hours 10/29/23 2202 10/30/23 0457   10/29/23 2300  ceFEPIme  (MAXIPIME ) 2 g in sodium chloride  0.9 % 100 mL IVPB  Status:  Discontinued        2 g 200 mL/hr over 30 Minutes Intravenous Every 12 hours 10/29/23 2209 10/30/23 0457   10/29/23 2200  cefTRIAXone  (ROCEPHIN ) 1 g in sodium chloride  0.9 % 100 mL IVPB  Status:  Discontinued        1 g 200 mL/hr over 30 Minutes Intravenous Every 12 hours 10/29/23 1855 10/29/23 2202   10/29/23 1424  vancomycin  (VANCOCIN ) powder  Status:  Discontinued  As needed 10/29/23 1424 10/29/23 1454   10/29/23 1130  cefTRIAXone  (ROCEPHIN ) 2 g in sodium chloride  0.9 % 100 mL IVPB        2 g 200 mL/hr over 30 Minutes Intravenous  Once 10/29/23 1115 10/29/23 1255   10/29/23 1130  vancomycin  (VANCOCIN ) IVPB 1000 mg/200 mL premix        1,000 mg 200 mL/hr over 60 Minutes Intravenous  Once 10/29/23 1115 10/29/23 1230   10/29/23 1119  sodium chloride  0.9 % with cefTRIAXone  (ROCEPHIN ) ADS Med       Note to Pharmacy: Franceen Inches: cabinet override      10/29/23 1119 10/29/23 1310   10/29/23 1119  vancomycin  (VANCOCIN ) 1-5 GM/200ML-% IVPB       Note to Pharmacy: Franceen Inches: cabinet override      10/29/23 1119 10/29/23  1241        I have personally reviewed the following labs and images: CBC: Recent Labs  Lab 10/26/23 2025 10/28/23 0506 10/29/23 0658 10/30/23 0621 10/31/23 0534 11/01/23 1053 11/02/23 0609  WBC 16.5*   < > 18.5* 17.7* 21.1* 17.1* 13.0*  NEUTROABS 13.3*  --   --   --   --   --   --   HGB 11.1*   < > 11.6* 11.3* 10.7* 10.1* 9.3*  HCT 34.4*   < > 35.0* 34.1* 32.8* 30.9* 29.2*  MCV 84.9   < > 82.9 82.8 83.7 83.7 84.6  PLT 289   < > 342 365 327 344 315   < > = values in this interval not displayed.   BMP &GFR Recent Labs  Lab 10/29/23 0658 10/30/23 0621 10/31/23 0534 11/01/23 1053 11/02/23 0609  NA 134* 134* 139 136 136  K 3.6 3.7 4.1 3.8 4.2  CL 97* 95* 100 97* 98  CO2 25 23 28 28 25   GLUCOSE 320* 303* 190* 182* 226*  BUN 31* 23 17 15 16   CREATININE 0.75 0.81 0.79 0.57 0.74  CALCIUM  9.4 9.1 9.0 8.9 8.7*  MG 2.1 1.9 2.1 2.1 2.0  PHOS 3.4 2.4* 2.5 2.3* 3.7   Estimated Creatinine Clearance: 79.3 mL/min (by C-G formula based on SCr of 0.74 mg/dL). Liver & Pancreas: Recent Labs  Lab 10/26/23 2025 10/29/23 6045 10/30/23 4098 10/31/23 0534 11/01/23 1053 11/02/23 0609  AST 27  --   --   --   --   --   ALT 29  --   --   --   --   --   ALKPHOS 89  --   --   --   --   --   BILITOT 1.3*  --   --   --   --   --   PROT 8.3*  --   --   --   --   --   ALBUMIN 3.0* 2.4* 2.4* 2.3* 2.3* 2.0*   No results for input(s): "LIPASE", "AMYLASE" in the last 168 hours.  No results for input(s): "AMMONIA" in the last 168 hours. Diabetic: No results for input(s): "HGBA1C" in the last 72 hours.  Recent Labs  Lab 11/01/23 1107 11/01/23 1638 11/01/23 2144 11/02/23 0610 11/02/23 1148  GLUCAP 179* 239* 201* 228* 165*   Cardiac Enzymes: No results for input(s): "CKTOTAL", "CKMB", "CKMBINDEX", "TROPONINI" in the last 168 hours. No results for input(s): "PROBNP" in the last 8760 hours. Coagulation Profile: No results for input(s): "INR", "PROTIME" in the last 168  hours.  Thyroid  Function Tests: No results  for input(s): "TSH", "T4TOTAL", "FREET4", "T3FREE", "THYROIDAB" in the last 72 hours. Lipid Profile: Recent Labs    10/31/23 0534  CHOL 75  HDL 26*  LDLCALC 37  TRIG 62  CHOLHDL 2.9   Anemia Panel: No results for input(s): "VITAMINB12", "FOLATE", "FERRITIN", "TIBC", "IRON", "RETICCTPCT" in the last 72 hours. Urine analysis:    Component Value Date/Time   COLORURINE YELLOW 10/26/2023 2027   APPEARANCEUR CLEAR 10/26/2023 2027   LABSPEC 1.031 (H) 10/26/2023 2027   PHURINE 5.0 10/26/2023 2027   GLUCOSEU >=500 (A) 10/26/2023 2027   HGBUR MODERATE (A) 10/26/2023 2027   BILIRUBINUR NEGATIVE 10/26/2023 2027   KETONESUR 20 (A) 10/26/2023 2027   PROTEINUR >=300 (A) 10/26/2023 2027   NITRITE NEGATIVE 10/26/2023 2027   LEUKOCYTESUR NEGATIVE 10/26/2023 2027   Sepsis Labs: Invalid input(s): "PROCALCITONIN", "LACTICIDVEN"  Microbiology: Recent Results (from the past 240 hours)  Blood Culture (routine x 2)     Status: None   Collection Time: 10/24/23  3:12 PM   Specimen: BLOOD LEFT ARM  Result Value Ref Range Status   Specimen Description   Final    BLOOD LEFT ARM Performed at [facility], [address] [place], [place] [address]    Special Requests   Final    BOTTLES DRAWN AEROBIC AND ANAEROBIC Blood Culture results may not be optimal due to an inadequate volume of blood received in culture bottles Performed at [facility], [address] [place], [place] [address]    Culture   Final    NO GROWTH 5 DAYS Performed at [facility], [address] [place], [place] [address]    Report Status 10/29/2023 FINAL  Final  Blood Culture (routine x 2)     Status: None   Collection Time: 10/24/23  3:17 PM   Specimen: BLOOD RIGHT ARM  Result Value Ref Range Status   Specimen Description   Final    BLOOD RIGHT ARM Performed at [facility], [address], [place], [place] [address]    Special  Requests   Final    BOTTLES DRAWN AEROBIC AND ANAEROBIC Blood Culture results may not be optimal due to an inadequate volume of blood received in culture bottles Performed at [facility], [address] [place], [place] [address]    Culture   Final    NO GROWTH 5 DAYS Performed at [facility], [address] [place], [place] [address]    Report Status 10/29/2023 FINAL  Final  Resp panel by RT-PCR (RSV, Flu A&B, Covid) Anterior Nasal Swab     Status: None   Collection Time: 10/24/23  3:57 PM   Specimen: Anterior Nasal Swab  Result Value Ref Range Status   SARS Coronavirus 2 by RT PCR NEGATIVE NEGATIVE Final    Comment: (NOTE) SARS-CoV-2 target nucleic acids are NOT DETECTED.  The SARS-CoV-2 RNA is generally detectable in upper respiratory specimens during the acute phase of infection. The lowest concentration of SARS-CoV-2 viral copies this assay can detect is 138 copies/mL. A negative result does not preclude SARS-Cov-2 infection and should not be used as the sole basis for treatment or other patient management decisions. A negative result may occur with  improper specimen collection/handling, submission of specimen other than nasopharyngeal swab, presence of viral mutation(s) within the areas targeted by this assay, and inadequate number of viral copies(<138 copies/mL). A negative result must be combined with clinical observations, patient history, and epidemiological information. The  expected result is Negative.  Fact Sheet for Patients:  BloggerCourse.com  Fact Sheet for Healthcare Providers:  SeriousBroker.it  This test is no t yet approved or cleared by the [place]  FDA and  has been authorized for detection and/or diagnosis of SARS-CoV-2 by FDA under an Emergency Use Authorization (EUA). This EUA will remain  in effect (meaning this test can be used) for the duration of the COVID-19  declaration under Section 564(b)(1) of the Act, 21 U.S.C.section 360bbb-3(b)(1), unless the authorization is terminated  or revoked sooner.       Influenza A by PCR NEGATIVE NEGATIVE Final   Influenza B by PCR NEGATIVE NEGATIVE Final    Comment: (NOTE) The Xpert Xpress SARS-CoV-2/FLU/RSV plus assay is intended as an aid in the diagnosis of influenza from Nasopharyngeal swab specimens and should not be used as a sole basis for treatment. Nasal washings and aspirates are unacceptable for Xpert Xpress SARS-CoV-2/FLU/RSV testing.  Fact Sheet for Patients: BloggerCourse.com  Fact Sheet for Healthcare Providers: SeriousBroker.it  This test is not yet approved or cleared by the [place]  FDA and has been authorized for detection and/or diagnosis of SARS-CoV-2 by FDA under an Emergency Use Authorization (EUA). This EUA will remain in effect (meaning this test can be used) for the duration of the COVID-19 declaration under Section 564(b)(1) of the Act, 21 U.S.C. section 360bbb-3(b)(1), unless the authorization is terminated or revoked.     Resp Syncytial Virus by PCR NEGATIVE NEGATIVE Final    Comment: (NOTE) Fact Sheet for Patients: BloggerCourse.com  Fact Sheet for Healthcare Providers: SeriousBroker.it  This test is not yet approved or cleared by the [place]  FDA and has been authorized for detection and/or diagnosis of SARS-CoV-2 by FDA under an Emergency Use Authorization (EUA). This EUA will remain in effect (meaning this test can be used) for the duration of the COVID-19 declaration under Section 564(b)(1) of the Act, 21 U.S.C. section 360bbb-3(b)(1), unless the authorization is terminated or revoked.  Performed at [facility], [address], [place], [place] [address]   Surgical pcr screen     Status: None   Collection Time: 10/29/23 10:02 AM    Specimen: Nasal Mucosa; Nasal Swab  Result Value Ref Range Status   MRSA, PCR NEGATIVE NEGATIVE Final   Staphylococcus aureus NEGATIVE NEGATIVE Final    Comment: (NOTE) The Xpert SA Assay (FDA approved for NASAL specimens in patients 81 years of age and older), is one component of a comprehensive surveillance program. It is not intended to diagnose infection nor to guide or monitor treatment. Performed at [facility], [address] [place], [place] [address]   Culture, blood (Routine X 2) w Reflex to ID Panel     Status: None (Preliminary result)   Collection Time: 10/29/23 10:18 AM   Specimen: BLOOD RIGHT ARM  Result Value Ref Range Status   Specimen Description BLOOD RIGHT ARM  Final   Special Requests   Final    BOTTLES DRAWN AEROBIC AND ANAEROBIC Blood Culture adequate volume   Culture   Final    NO GROWTH 4 DAYS Performed at [facility], [address], [place], [place] [address]    Report Status PENDING  Incomplete  Culture, blood (Routine X 2) w Reflex to ID Panel     Status: Abnormal   Collection Time: 10/29/23 10:27 AM   Specimen: BLOOD LEFT ARM  Result Value Ref Range Status   Specimen Description BLOOD LEFT ARM  Final   Special Requests   Final    BOTTLES DRAWN AEROBIC AND ANAEROBIC BACTEROIDES CACCAE   Culture  Setup Time   Final    GRAM POSITIVE COCCI IN PAIRS IN CHAINS ANAEROBIC BOTTLE ONLY CRITICAL RESULT CALLED TO, READ BACK BY AND VERIFIED WITH: PHARMD J LEDFORD 10/30/2023 @ 0440 BY AB Performed at [facility], [address], [place], [place] [address]    Culture STREPTOCOCCUS PNEUMONIAE (A)  Final   Report Status 11/01/2023 FINAL  Final   Organism ID, Bacteria STREPTOCOCCUS PNEUMONIAE  Final      Susceptibility   Streptococcus pneumoniae - MIC*    ERYTHROMYCIN <=0.12 SENSITIVE Sensitive     LEVOFLOXACIN 1 SENSITIVE Sensitive     VANCOMYCIN  0.5 SENSITIVE Sensitive     PENICILLIN (meningitis) <=0.06 SENSITIVE Sensitive     PENO -  penicillin <=0.06      PENICILLIN (non-meningitis) <=0.06 SENSITIVE Sensitive     PENICILLIN (oral) <=0.06 SENSITIVE Sensitive     CEFTRIAXONE  (non-meningitis) <=0.12 SENSITIVE Sensitive     CEFTRIAXONE  (meningitis) <=0.12 SENSITIVE Sensitive     * STREPTOCOCCUS PNEUMONIAE  Blood Culture ID Panel (Reflexed)     Status: Abnormal   Collection Time: 10/29/23 10:27 AM  Result Value Ref Range Status   Enterococcus faecalis NOT DETECTED NOT DETECTED Final   Enterococcus Faecium NOT DETECTED NOT DETECTED Final   Listeria monocytogenes NOT DETECTED NOT DETECTED Final   Staphylococcus species NOT DETECTED NOT DETECTED Final   Staphylococcus aureus (BCID) NOT DETECTED NOT DETECTED Final   Staphylococcus epidermidis NOT DETECTED NOT DETECTED Final   Staphylococcus lugdunensis NOT DETECTED NOT DETECTED Final   Streptococcus species DETECTED (A) NOT DETECTED Final    Comment: CRITICAL RESULT CALLED TO, READ BACK BY AND VERIFIED WITH: PHARMD J LEDFORD 10/30/2023 @ 0440 BY AB    Streptococcus agalactiae NOT DETECTED NOT DETECTED Final   Streptococcus pneumoniae DETECTED (A) NOT DETECTED Final    Comment: CRITICAL RESULT CALLED TO, READ BACK BY AND VERIFIED WITH: PHARMD J LEDFORD 10/30/2023 @ 0440 BY AB    Streptococcus pyogenes NOT DETECTED NOT DETECTED Final   A.calcoaceticus-baumannii NOT DETECTED NOT DETECTED Final   Bacteroides fragilis NOT DETECTED NOT DETECTED Final   Enterobacterales NOT DETECTED NOT DETECTED Final   Enterobacter cloacae complex NOT DETECTED NOT DETECTED Final   Escherichia coli NOT DETECTED NOT DETECTED Final   Klebsiella aerogenes NOT DETECTED NOT DETECTED Final   Klebsiella oxytoca NOT DETECTED NOT DETECTED Final   Klebsiella pneumoniae NOT DETECTED NOT DETECTED Final   Proteus species NOT DETECTED NOT DETECTED Final   Salmonella species NOT DETECTED NOT DETECTED Final   Serratia marcescens NOT DETECTED NOT DETECTED Final   Haemophilus influenzae NOT DETECTED NOT DETECTED  Final   Neisseria meningitidis NOT DETECTED NOT DETECTED Final   Pseudomonas aeruginosa NOT DETECTED NOT DETECTED Final   Stenotrophomonas maltophilia NOT DETECTED NOT DETECTED Final   Candida albicans NOT DETECTED NOT DETECTED Final   Candida auris NOT DETECTED NOT DETECTED Final   Candida glabrata NOT DETECTED NOT DETECTED Final   Candida krusei NOT DETECTED NOT DETECTED Final   Candida parapsilosis NOT DETECTED NOT DETECTED Final   Candida tropicalis NOT DETECTED NOT DETECTED Final   Cryptococcus neoformans/gattii NOT DETECTED NOT DETECTED Final    Comment: Performed at [facility], [address] [place], [place] [address]  Aerobic/Anaerobic Culture w Gram Stain (surgical/deep wound)     Status: None (Preliminary result)   Collection Time:  10/29/23  1:13 PM   Specimen: PATH Soft tissue  Result Value Ref Range Status   Specimen Description ABSCESS  Final   Special Requests LUMBAR ABSCESS NO 1  Final   Gram Stain   Final    FEW WBC PRESENT, PREDOMINANTLY PMN RARE GRAM POSITIVE COCCI IN PAIRS Performed at [facility], [address], [place], [place] [address]    Culture   Final    FEW STREPTOCOCCUS PNEUMONIAE SUSCEPTIBILITIES PERFORMED ON PREVIOUS CULTURE WITHIN THE LAST 5 DAYS. NO ANAEROBES ISOLATED; CULTURE IN PROGRESS FOR 5 DAYS    Report Status PENDING  Incomplete  Aerobic/Anaerobic Culture w Gram Stain (surgical/deep wound)     Status: None (Preliminary result)   Collection Time: 10/29/23  1:23 PM   Specimen: PATH Soft tissue  Result Value Ref Range Status   Specimen Description ABSCESS  Final   Special Requests LUMBAR ABSCESS NO 2  Final   Gram Stain   Final    ABUNDANT WBC PRESENT, PREDOMINANTLY PMN FEW GRAM POSITIVE COCCI IN PAIRS Performed at [facility], [address] [place], [place] [address]    Culture   Final    MODERATE STREPTOCOCCUS PNEUMONIAE NO ANAEROBES ISOLATED; CULTURE IN PROGRESS FOR 5 DAYS    Report Status PENDING   Incomplete   Organism ID, Bacteria STREPTOCOCCUS PNEUMONIAE  Final      Susceptibility   Streptococcus pneumoniae - MIC*    ERYTHROMYCIN <=0.12 SENSITIVE Sensitive     LEVOFLOXACIN 1 SENSITIVE Sensitive     VANCOMYCIN  0.5 SENSITIVE Sensitive     PENICILLIN (meningitis) <=0.06 SENSITIVE Sensitive     PENO - penicillin <=0.06      PENICILLIN (non-meningitis) <=0.06 SENSITIVE Sensitive     PENICILLIN (oral) <=0.06 SENSITIVE Sensitive     CEFTRIAXONE  (non-meningitis) <=0.12 SENSITIVE Sensitive     CEFTRIAXONE  (meningitis) <=0.12 SENSITIVE Sensitive     * MODERATE STREPTOCOCCUS PNEUMONIAE  Aerobic/Anaerobic Culture w Gram Stain (surgical/deep wound)     Status: None (Preliminary result)   Collection Time: 10/29/23  1:30 PM   Specimen: PATH Soft tissue  Result Value Ref Range Status   Specimen Description ABSCESS  Final   Special Requests LUMBAR ABSCESS NO 3  Final   Gram Stain   Final    RARE WBC PRESENT, PREDOMINANTLY PMN RARE GRAM POSITIVE COCCI IN PAIRS Performed at [facility], [address] [place], [place] [address]    Culture   Final    RARE STREPTOCOCCUS PNEUMONIAE SUSCEPTIBILITIES PERFORMED ON PREVIOUS CULTURE WITHIN THE LAST 5 DAYS. NO ANAEROBES ISOLATED; CULTURE IN PROGRESS FOR 5 DAYS    Report Status PENDING  Incomplete  Culture, blood (Routine X 2) w Reflex to ID Panel     Status: None (Preliminary result)   Collection Time: 10/30/23  5:48 PM   Specimen: BLOOD LEFT ARM  Result Value Ref Range Status   Specimen Description BLOOD LEFT ARM  Final   Special Requests   Final    BOTTLES DRAWN AEROBIC AND ANAEROBIC Blood Culture adequate volume   Culture   Final    NO GROWTH 3 DAYS Performed at [facility], [address], [place], [place] [address]    Report Status PENDING  Incomplete  Culture, blood (Routine X 2) w Reflex to ID Panel     Status: None (Preliminary result)   Collection Time: 10/30/23  6:07 PM   Specimen: BLOOD LEFT ARM  Result Value Ref  Range Status  Specimen Description BLOOD LEFT ARM  Final   Special Requests   Final    AEROBIC BOTTLE ONLY Blood Culture results may not be optimal due to an inadequate volume of blood received in culture bottles   Culture   Final    NO GROWTH 3 DAYS Performed at [facility], [address] [place], [place] [address]    Report Status PENDING  Incomplete    Radiology Studies: DG Orthopantogram Result Date: 11/02/2023 CLINICAL DATA:  Infection.  Jaw pain. EXAM: ORTHOPANTOGRAM/PANORAMIC COMPARISON:  None Available. FINDINGS: No fracture or lytic lesion is noted.  Poor dentition is noted. IMPRESSION: No acute abnormality seen. Electronically Signed   By: Rosalene Colon M.D.   On: 11/02/2023 13:41       Fonnie Iba, MD  [facility]  If 7PM-7AM, please contact night-coverage [email or web address] 11/02/2023, 3:00 PM

## 2023-11-02 NOTE — Progress Notes (Signed)
 Mobility Specialist Progress Note:    11/02/23 1300  Mobility  Activity Dangled on edge of bed  Level of Assistance Minimal assist, patient does 75% or more  Assistive Device None  Activity Response Tolerated well  Mobility Referral Yes  Mobility visit 1 Mobility  Mobility Specialist Start Time (ACUTE ONLY) 1340  Mobility Specialist Stop Time (ACUTE ONLY) 1345  Mobility Specialist Time Calculation (min) (ACUTE ONLY) 5 min   Pt received in bed, requesting assistance to slide up in bed to eat lunch. Declined transferring to chair, but agreeable to sit EOB and eat. Required minA to come EOB while adhering to back precautions. No complaints throughout. Pt left on EOB with call bell and all needs met. Bed alarm on and family present.  D'Vante Nolon Baxter Mobility Specialist Please contact via Special educational needs teacher or Rehab office at [phone]

## 2023-11-02 NOTE — Progress Notes (Signed)
 Patient ID: Kristina Long, female   DOB: 05/04/1956, 68 y.o.   MRN: [id number] Overall she is doing really well.  She has some mild right hip and thigh soreness when she is up walking.  She has no weakness.  She has had some pink serosanguineous drainage from the top of her incision.  She has no headache.  We placed several staples and a sterile occlusive dressing.  Continue to mobilize as tolerated.  Continue antibiotics per infectious disease.

## 2023-11-02 NOTE — Progress Notes (Signed)
 Inpatient Rehab Admissions Coordinator:    Insurance denied CIR. Pt. Wishes to appeal. Family also wants to pursue SNF as backup.   Wandalee Gust, MS, CCC-SLP Rehab Admissions Coordinator  [phone] (celll) [phone] (office)

## 2023-11-02 NOTE — Consult Note (Cosign Needed)
 [address]       [place] [address]             [phone]        Dairy Wisser Kincaid Medical Record #[id number] Date of Birth: 1956/02/03  Referring:Singh, Donnette Gal, MD Primary Care: Arlys Berke, MD Primary Cardiologist:None  Reason for Consult: Evaluation for potential surgical management of mitral valve endocarditis.    History of Present Illness:     Ms. Cassidie Mountain is a 68 year old female with past medical history notable for type 2 diabetes mellitus and dyslipidemia who presented to the emergency room over an outside hospital 3 times over 1 week beginning on 10/22/2023.  Her primary complaint was right back and flank pain and gradual decline in her mobility. At each visit, she was noted to have a leukocytosis that was unexplained.  On the 3rd visit she presented with altered mental status after accidentally taking 1600 mg of ibuprofen in a single dose along with 2 oxycodone  tablets and the Robaxin  she had been prescribed at an earlier ED visit.  At that point, she was transferred to [facility] for MRI under anesthesia.  Her encephalopathy resolved but the MRI demonstrated an epidural abscess extending from T11-L5 with severe spinal canal stenosis at L1-L2 and L2-L3.  She underwent decompressive lumbar laminectomy, medial facetectomy, and foraminotomies from T12-L3 with evacuation of an epidural abscess by Dr. Rochelle Chu on 5 /5.  Blood cultures were obtained as well as cultures of the epidural abscess obtained on 5/5 all grew strep pneumonia.  The infectious disease team has been engaged for management of the antibiotics.  Further workup for an infectious source was ensued.  Transesophageal echocardiography was performed earlier today showing vegetations on the mitral valve. Cardiothoracic surgery has been asked to evaluate [name] for potential surgical management of the mitral valve endocarditis.  Currently, [name] is resting in bed with  her husband at the bedside.  She denies any chest pain or shortness of breath.  Her appetite has been good.  She denies having any dental issues recently. Last dental exam was about 1 year ago.  She does report having a sore throat that preceded the initial visit to the ED for back pain.   Zubrod Score: At the time of surgery this patient's most appropriate activity status/level should be described as: []     0    Normal activity, no symptoms []     1    Restricted in physical strenuous activity but ambulatory, able to do out light work []     2    Ambulatory and capable of self care, unable to do work activities, up and about                 more than 50%  Of the time                            [x]     3    Only limited self care, in bed greater than 50% of waking hours []     4    Completely disabled, no self care, confined to bed or chair []     5    Moribund  Past Medical History:  Diagnosis Date   Allergy    Diabetes mellitus without complication (HCC)     Past Surgical History:  Procedure Laterality Date   LUMBAR LAMINECTOMY FOR EPIDURAL ABSCESS N/A 10/29/2023  Procedure: LUMBAR LAMINECTOMY FOR EPIDURAL ABSCESS;  Surgeon: Joaquin Mulberry, MD;  Location[facility] OR;  Service: Neurosurgery;  Laterality: N/A;   TRANSESOPHAGEAL ECHOCARDIOGRAM (CATH LAB) N/A 11/01/2023   Procedure: TRANSESOPHAGEAL ECHOCARDIOGRAM;  Surgeon: Jerryl Morin, DO;  Location: [facility] INVASIVE CV LAB;  Service: Cardiovascular;  Laterality: N/A;    Social History   Tobacco Use  Smoking Status Never  Smokeless Tobacco Never    Social History   Substance and Sexual Activity  Alcohol Use No     Allergies  Allergen Reactions   Blueberry [Vaccinium Angustifolium] Swelling and Other (See Comments)    Lips swell    Current Facility-Administered Medications  Medication Dose Route Frequency Provider Last Rate Last Admin   acetaminophen  (TYLENOL ) tablet 1,000 mg  1,000 mg Oral Q6H WA Gonfa, Taye T, MD   1,000 mg at  11/02/23 1409   amLODipine  (NORVASC ) tablet 10 mg  10 mg Oral Daily Joaquin Mulberry, MD   10 mg at 11/02/23 1020   atorvastatin  (LIPITOR) tablet 10 mg  10 mg Oral QHS Gonfa, Taye T, MD   10 mg at 11/01/23 2150   bisacodyl  (DULCOLAX) suppository 10 mg  10 mg Rectal Daily Joaquin Mulberry, MD   10 mg at 10/31/23 1340   cefTRIAXone  (ROCEPHIN ) 2 g in sodium chloride  0.9 % 100 mL IVPB  2 g Intravenous Q24H Singh, Mayanka, MD 200 mL/hr at 11/02/23 0506 2 g at 11/02/23 1191   celecoxib  (CELEBREX ) capsule 200 mg  200 mg Oral Q12H Joaquin Mulberry, MD   200 mg at 11/02/23 1020   haloperidol  lactate (HALDOL ) injection 2 mg  2 mg Intravenous Q6H PRN Gonfa, Taye T, MD       HYDROmorphone  (DILAUDID ) injection 0.5 mg  0.5 mg Intravenous Q3H PRN Gonfa, Taye T, MD   0.5 mg at 11/02/23 0054   insulin  aspart (novoLOG ) injection 0-20 Units  0-20 Units Subcutaneous TID WC Gonfa, Taye T, MD   4 Units at 11/02/23 1250   insulin  aspart (novoLOG ) injection 0-5 Units  0-5 Units Subcutaneous QHS Joaquin Mulberry, MD   2 Units at 11/01/23 2150   insulin  aspart (novoLOG ) injection 7 Units  7 Units Subcutaneous TID WC Joaquin Mulberry, MD   7 Units at 11/02/23 1250   insulin  glargine-yfgn (SEMGLEE ) injection 30 Units  30 Units Subcutaneous Daily Gonfa, Taye T, MD   30 Units at 11/02/23 1020   labetalol  (NORMODYNE ) injection 10 mg  10 mg Intravenous Q2H PRN Joaquin Mulberry, MD       linagliptin  (TRADJENTA ) tablet 5 mg  5 mg Oral Daily Gonfa, Taye T, MD   5 mg at 11/02/23 1020   lisinopril  (ZESTRIL ) tablet 5 mg  5 mg Oral Daily Gonfa, Taye T, MD   5 mg at 11/02/23 1020   loratadine  (CLARITIN ) tablet 10 mg  10 mg Oral Daily PRN Joaquin Mulberry, MD       magnesium  citrate solution 1 Bottle  1 Bottle Oral Daily PRN Gonfa, Taye T, MD   1 Bottle at 10/30/23 1446   menthol -cetylpyridinium (CEPACOL) lozenge 3 mg  1 lozenge Oral PRN Joaquin Mulberry, MD       Or   phenol [facility]) mouth spray 1 spray  1 spray  Mouth/Throat PRN Joaquin Mulberry, MD       methocarbamol  (ROBAXIN ) tablet 500 mg  500 mg Oral Q6H PRN Joaquin Mulberry, MD   500 mg at 11/02/23 4782   Or  methocarbamol  (ROBAXIN ) injection 500 mg  500 mg Intravenous Q6H PRN Joaquin Mulberry, MD       ondansetron  (ZOFRAN ) tablet 4 mg  4 mg Oral Q6H PRN Joaquin Mulberry, MD       Or   ondansetron  (ZOFRAN ) injection 4 mg  4 mg Intravenous Q6H PRN Joaquin Mulberry, MD       Oral care mouth rinse  15 mL Mouth Rinse PRN Gonfa, Taye T, MD       oxyCODONE  (Oxy IR/ROXICODONE ) immediate release tablet 5 mg  5 mg Oral Q6H PRN Gonfa, Taye T, MD   5 mg at 11/02/23 0506   polyethylene glycol (MIRALAX  / GLYCOLAX ) packet 17 g  17 g Oral BID PRN Joaquin Mulberry, MD   17 g at 10/31/23 1719   senna-docusate (Senokot-S) tablet 2 tablet  2 tablet Oral BID Gonfa, Taye T, MD   2 tablet at 11/02/23 1020   sodium chloride  flush (NS) 0.9 % injection 3 mL  3 mL Intravenous Q12H Joaquin Mulberry, MD   3 mL at 11/02/23 1022   sodium chloride  flush (NS) 0.9 % injection 3 mL  3 mL Intravenous PRN Joaquin Mulberry, MD        Medications Prior to Admission  Medication Sig Dispense Refill Last Dose/Taking   atorvastatin  (LIPITOR) 10 MG tablet Take 1 tablet (10 mg total) by mouth daily. (Patient taking differently: Take 10 mg by mouth at bedtime.) 90 tablet 3 10/26/2023   calcium  carbonate (OS-CAL) 600 MG tablet Take 600 mg by mouth daily.   Past Week   CLARITIN -D 12 HOUR 5-120 MG tablet Take 1 tablet by mouth 2 (two) times daily as needed for allergies.   Past Month   lisinopril  (ZESTRIL ) 2.5 MG tablet Take 2.5 mg by mouth 2 (two) times daily.   10/26/2023   metaxalone  (SKELAXIN ) 800 MG tablet Take 1 tablet (800 mg total) by mouth 3 (three) times daily. 21 tablet 0 10/26/2023   metFORMIN  (GLUCOPHAGE -XR) 500 MG 24 hr tablet Take 2 tablets (1,000 mg total) by mouth 2 (two) times daily with a meal. 2 tabs in afternoon and 2 tabs night (Patient taking differently: Take  1,000 mg by mouth 2 (two) times daily with a meal.) 360 tablet 2 10/26/2023   methocarbamol  (ROBAXIN ) 500 MG tablet Take 1 tablet (500 mg total) by mouth every 8 (eight) hours as needed. (Patient taking differently: Take 500 mg by mouth every 8 (eight) hours as needed for muscle spasms.) 8 tablet 0 Past Week   oxyCODONE -acetaminophen  (PERCOCET/ROXICET) 5-325 MG tablet Take 1 tablet by mouth every 6 (six) hours as needed for severe pain (pain score 7-10). 15 tablet 0 10/26/2023   sitaGLIPtin  (JANUVIA ) 100 MG tablet Take 1 tablet (100 mg total) by mouth daily. 90 tablet 2 10/26/2023   Lancets (ONETOUCH DELICA PLUS LANCET33G) MISC USE AS INSTRUCTED TO CHECK BLOOD SUGAR 2 TIMES PER DAY 100 each 0    ONETOUCH VERIO test strip USE AS DIRECTED TO TEST BLOOD SUGAR TWICE A DAY 100 strip 5     Family History  Problem Relation Age of Onset   Diabetes Father    Diabetes Maternal Grandmother      Review of Systems:     Cardiac Review of Systems: Y or  [    ]= no  Chest Pain [    ]  Resting SOB [   ] Exertional SOB  [  ]  Orthopnea [  ]  Pedal Edema [   ]    Palpitations [  ] Syncope  [  ]   Presyncope [   ]  General Review of Systems: [Y] = yes [  ]=no Constitional: recent weight change [  ]; anorexia [  ]; fatigue [  ]; nausea [  ]; night sweats [  ]; fever [x  ]; or chills [ x ]                                                               Dental: Last Dentist visit: >1 year ago  Eye : blurred vision [  ]; diplopia [   ]; vision changes [  ];  Amaurosis fugax[  ]; Resp: cough [  ];  wheezing[  ];  hemoptysis[  ]; shortness of breath[  ]; paroxysmal nocturnal dyspnea[  ]; dyspnea on exertion[  ]; or orthopnea[  ];  GI:  gallstones[  ], vomiting[  ];  dysphagia[  ]; melena[  ];  hematochezia [  ]; heartburn[  ];   Hx of  Colonoscopy[  ]; GU: kidney stones [  ]; hematuria[  ];   dysuria [  ];  nocturia[  ];  history of     obstruction [  ]; urinary frequency [  ]             Skin: rash, swelling[  ];, hair  loss[  ];  peripheral edema[  ];  or itching[  ]; Musculosketetal: myalgias[  ];  joint swelling[  ];  joint erythema[  ];  joint pain[  ];  back pain[ x ];  Heme/Lymph: bruising[  ];  bleeding[  ];  anemia[  ];  Neuro: TIA[  ];  headaches[  ];  stroke[  ];  vertigo[  ];  seizures[  ];   paresthesias[  ];  difficulty walking[ x, improved ];  Psych:depression[  ]; anxiety[  ];  Endocrine: diabetes[ Type 2 DM ];  thyroid  dysfunction[  ];                Physical Exam: BP 130/74 (BP Location: Left Arm)   Pulse 80   Temp 98 F (36.7 C)   Resp 19   Ht 5\' 10"  (1.778 m)   Wt 86.6 kg   LMP  (LMP Unknown)   SpO2 98%   BMI 27.41 kg/m    General appearance: alert, cooperative, and no distress Head: Normocephalic, without obvious abnormality, atraumatic Neck: no adenopathy, no carotid bruit, no JVD, and supple, symmetrical, trachea midline Lymph nodes: No cervical or clavicular lymphadenopathy Resp: Normal work of breathing on room air, breath sounds are clear to auscultation. Back: There is a dry dressing in the midline of the low back Cardio: Regular rate and rhythm, normal S1 and S2, no murmur GI: Soft, no tenderness, active bowel sounds Extremities: All warm and well-perfused with palpable distal pulses.  No petechiae or ecchymoses Neurologic: Grossly normal  Diagnostic Studies & Laboratory data:     Recent Radiology Findings:   DG Orthopantogram Result Date: 11/02/2023 CLINICAL DATA:  Infection.  Jaw pain. EXAM: ORTHOPANTOGRAM/PANORAMIC COMPARISON:  None Available. FINDINGS: No fracture or lytic lesion is noted.  Poor dentition is noted. IMPRESSION: No acute abnormality seen. Electronically Signed   By: Royston Cornea  Alaine Alken M.D.   On: 11/02/2023 13:41   TRANSESOPHOGEAL ECHO REPORT       Patient Name:   Charnice Walth Date of Exam: 11/01/2023  Medical Rec #:  [id number]      Height:       70.0 in  Accession #:    [id number]     Weight:       191.0 lb  Date of Birth:  08-31-1955      BSA:          2.047 m  Patient Age:    60 years       BP:  Patient Gender: F              HR:  Exam Location:  Inpatient   Procedure: 2D Echo (Both Spectral and Color Flow Doppler were utilized  during            procedure).   Indications:    bacteremia    History:        Patient has prior history of Echocardiogram examinations.    Sonographer:    Hersey Lorenzo  Referring Phys: [id number] HAO MENG   PROCEDURE: The transesophogeal probe was passed without difficulty through  the esophogus of the patient. Sedation performed by different physician.  The patient's vital signs; including heart rate, blood pressure, and  oxygen  saturation; remained stable  throughout the procedure. The patient developed no complications during  the procedure.    IMPRESSIONS     1. Left ventricular ejection fraction, by estimation, is 60 to 65%. The  left ventricle has normal function. The left ventricle has no regional  wall motion abnormalities.   2. Right ventricular systolic function is normal. The right ventricular  size is normal.   3. No left atrial/left atrial appendage thrombus was detected.   4. There is a mobile mass ( 1.51 cm x 0.482 cm) on the anterior mitral  valve leaflet. Then another mobile mass ( 0.509 cm x 0.532 cm) on th  posterior mitral leaflet. The mitral valve is abnormal. Trivial mitral  valve regurgitation. No evidence of mitral   stenosis.   5. The aortic valve is normal in structure. There is moderate thickening  of the aortic valve. Aortic valve regurgitation is mild. No aortic  stenosis is present.   6. The inferior vena cava is normal in size with greater than 50%  respiratory variability, suggesting right atrial pressure of 3 mmHg.   Conclusion(s)/Recommendation(s): Findings are concerning for  vegetation/infective endocarditis as detailed above.   FINDINGS   Left Ventricle: Left ventricular ejection fraction, by estimation, is 60  to 65%. The left ventricle has  normal function. The left ventricle has no  regional wall motion abnormalities. The left ventricular internal cavity  size was normal in size. There is   no left ventricular hypertrophy.   Right Ventricle: The right ventricular size is normal. No increase in  right ventricular wall thickness. Right ventricular systolic function is  normal.   Left Atrium: Left atrial size was normal in size. No left atrial/left  atrial appendage thrombus was detected.   Right Atrium: Right atrial size was normal in size.   Pericardium: There is no evidence of pericardial effusion.   Mitral Valve: There is a mobile mass ( 1.51 cm x 0.482 cm) on the anterior  mitral valve leaflet. Then another mobile mass ( 0.509 cm x 0.532 cm) on  th posterior mitral leaflet. The mitral valve is abnormal. Trivial mitral  valve  regurgitation. No evidence of mitral valve stenosis.   Tricuspid Valve: The tricuspid valve is normal in structure. Tricuspid  valve regurgitation is not demonstrated. No evidence of tricuspid  stenosis.   Aortic Valve: The aortic valve is normal in structure. There is moderate  thickening of the aortic valve. Aortic valve regurgitation is mild. No  aortic stenosis is present.   Pulmonic Valve: The pulmonic valve was normal in structure. Pulmonic valve  regurgitation is not visualized. No evidence of pulmonic stenosis.   Aorta: The aortic root is normal in size and structure.   Pulmonary Artery: The pulmonary artery is not well seen.   Venous: The left upper pulmonary vein, left lower pulmonary vein, right  upper pulmonary vein and right lower pulmonary vein are normal. A normal  flow pattern is recorded from the right upper pulmonary vein. The inferior  vena cava is normal in size with  greater than 50% respiratory variability, suggesting right atrial pressure  of 3 mmHg.   IAS/Shunts: No atrial level shunt detected by color flow Doppler.       AORTA  Ao Asc diam: 2.80 cm    Kardie Tobb DO  Electronically signed by Jerryl Morin DO  Signature Date/Time: 11/01/2023/4:24:08 PM        I have independently reviewed the above radiologic studies and discussed with the patient   Recent Lab Findings: Lab Results  Component Value Date   WBC 13.0 (H) 11/02/2023   HGB 9.3 (L) 11/02/2023   HCT 29.2 (L) 11/02/2023   PLT 315 11/02/2023   GLUCOSE 226 (H) 11/02/2023   CHOL 75 10/31/2023   TRIG 62 10/31/2023   HDL 26 (L) 10/31/2023   LDLCALC 37 10/31/2023   ALT 29 10/26/2023   AST 27 10/26/2023   NA 136 11/02/2023   K 4.2 11/02/2023   CL 98 11/02/2023   CREATININE 0.74 11/02/2023   BUN 16 11/02/2023   CO2 25 11/02/2023   TSH 0.51 01/03/2022   INR 1.2 10/24/2023   HGBA1C 8.7 (H) 10/26/2023      Assessment / Plan:      68 year old female admitted to hospital 10/28/23 after 3 emergency room visits for back pain, difficulty walking, and leukocytosis.  She was ultimately found to have an epidural abscess that has been drained by neurosurgery.  Blood cultures and wound cultures obtained in the OR have grown strep pneumonia.  She is being followed by infectious disease with appropriate antibiotic therapy.  Further workup including transesophageal echocardiogram performed today showed small vegetations on the mitral valve.  There is no mitral insufficiency.  She is currently stable afebrile, and showing gradual improvement in her leukocytosis from 21,000->13,000.  Expect she will be best served with continuation of medical therapy for mitral valvular endocarditis with follow-up echo after several weeks to confirm resolution of the vegetations.  Dr. Luna Salinas will review clinical data, the transesophageal echo imaging, and will see [name].  Further recommendations regarding management will follow.    I  spent 30 minutes counseling the patient face to face.   Leata Providence, PA-C  11/02/2023 2:37 PM  Patient seen and examined, records and echo images  reviewed.  68 yo woman with no prior cardiac history with mitral endocarditis secondary to strep pneumonia.  Presented with back pain secondary to epidural abscess. Prior to that had a sore throat about 3 weeks ago. TEE yesterday showed mitral vegetations involving anterior and posterior leaflets.  On ceftriaxone  with plan for 6 week course.  TEE showed "mobile mass ( 1.51 cm x 0.482 cm) on the anterior mitral  valve leaflet.  Then another mobile mass ( 0.509 cm x 0.532 cm) on th  posterior mitral leaflet. "  No significant MR.  I reviewed the TEE images.  Size is difficult to quantitate precisely due to surround chordal structures.  There is a vegetation in the subvalvular apparatus that is "mobile" in the sense that the valve, chordae etc are moving. Not the typical mobile vegetation attached to a leaflet that is highly mobile and high risks for embolization.  Therefore, would not consider it an indication for urgent surgery.  There is always a risk of embolization with any endocarditis case but I don't think her risk is significantly increased.  She has defervesced and WBC down to 11K.  In my opinion best option for now is IV antibiotics and monitoring.  If vegetation progresses or she develops MR would need surgery.  Needs much more aggressive blood sugar control in this setting  Needs to see a dentist as soon as it is feasible.  Milon Aloe Luna Salinas, MD [facility] [phone]

## 2023-11-02 NOTE — Progress Notes (Signed)
 Physical Therapy Treatment Patient Details Name: Kristina Long MRN: [id number] DOB: 1955-11-07 Today's Date: 11/02/2023   History of Present Illness 68 y.o. female admitted 10/26/23 with progressively worsening low back and R flank pain (seen in ED 4/28 and 4/30, eventually sent home); complicated by AMS, wandering in ED hallway, suspect related to pain meds. Lumbar CT showed Grade 1 L4-5 anterolisthesis. Pt unable to tolerate MRI. S/P decompressive lumbar laminectomy T12-L3 5/5.PMH includes DM.    PT Comments  Pt states her back feels much better today, is eager to mobilize in hallway. Pt ambulatory for good hallway distance, requires max safety cuing for proximity of RW and posture. Pt struggling most with bed mobility at this time, requiring mod physical assist to right trunk to sit up today. Pt expressing desire for post-operative rehab prior to d/c home, recommendations updated accordingly.      If plan is discharge home, recommend the following: Supervision due to cognitive status;Assistance with cooking/housework;Assist for transportation;Help with stairs or ramp for entrance;A little help with walking and/or transfers;A little help with bathing/dressing/bathroom   Can travel by private vehicle     Yes  Equipment Recommendations  None recommended by PT    Recommendations for Other Services       Precautions / Restrictions Precautions Precautions: Back;Fall Recall of Precautions/Restrictions: Impaired Precaution/Restrictions Comments: reviewed BLT rules, no brace needed Restrictions Weight Bearing Restrictions Per Provider Order: No     Mobility  Bed Mobility Overal bed mobility: Needs Assistance Bed Mobility: Sidelying to Sit   Sidelying to sit: Mod assist       General bed mobility comments: assist for trunk elevation to upright, pt in R sidelying upon PT arrival to room.    Transfers Overall transfer level: Needs assistance Equipment used: Rolling walker (2  wheels) Transfers: Sit to/from Stand Sit to Stand: Contact guard assist, From elevated surface           General transfer comment: for safety, slow to rise and bed height elevated    Ambulation/Gait Ambulation/Gait assistance: Contact guard assist Gait Distance (Feet): 150 Feet Assistive device: Rolling walker (2 wheels) Gait Pattern/deviations: Step-through pattern, Decreased stride length, Trunk flexed, Drifts right/left Gait velocity: decr     General Gait Details: close guard for safety, cues for upright posture, proximity to [employer] Mobility     Tilt Bed    Modified Rankin (Stroke Patients Only)       Balance Overall balance assessment: Needs assistance Sitting-balance support: No upper extremity supported, Feet supported Sitting balance-Leahy Scale: Fair     Standing balance support: Bilateral upper extremity supported, During functional activity, Reliant on assistive device for balance Standing balance-Leahy Scale: Poor                              Communication Communication Communication: No apparent difficulties  Cognition Arousal: Alert Behavior During Therapy: WFL for tasks assessed/performed   PT - Cognitive impairments: Attention, Sequencing, Initiation                       PT - Cognition Comments: cues for attention to safety during hallway mobility, frequent safety cues with RW Following commands: Impaired Following commands impaired: Follows one step commands with increased time    Cueing Cueing Techniques: Verbal cues, Tactile cues  Exercises      General Comments  Pertinent Vitals/Pain Pain Assessment Pain Assessment: 0-10 Pain Score: 3  Pain Location: back Pain Descriptors / Indicators: Grimacing, Discomfort Pain Intervention(s): Limited activity within patient's tolerance, Monitored during session, Repositioned    Home Living                           Prior Function            PT Goals (current goals can now be found in the care plan section) Acute Rehab PT Goals PT Goal Formulation: With patient/family Time For Goal Achievement: 11/13/23 Potential to Achieve Goals: Good Progress towards PT goals: Progressing toward goals    Frequency    Min 5X/week      PT Plan      Co-evaluation              AM-PAC PT "6 Clicks" Mobility   Outcome Measure  Help needed turning from your back to your side while in a flat bed without using bedrails?: A Little Help needed moving from lying on your back to sitting on the side of a flat bed without using bedrails?: A Lot Help needed moving to and from a bed to a chair (including a wheelchair)?: A Lot Help needed standing up from a chair using your arms (e.g., wheelchair or bedside chair)?: A Little Help needed to walk in hospital room?: A Little Help needed climbing 3-5 steps with a railing? : A Lot 6 Click Score: 15    End of Session   Activity Tolerance: Patient tolerated treatment well Patient left: in bed;with call bell/phone within reach;with bed alarm set;with family/visitor present Nurse Communication: Mobility status PT Visit Diagnosis: Other abnormalities of gait and mobility (R26.89);Muscle weakness (generalized) (M62.81);Pain Pain - part of body:  (back)     Time: 9629-5284 PT Time Calculation (min) (ACUTE ONLY): 17 min  Charges:    $Gait Training: 8-22 mins PT General Charges $$ ACUTE PT VISIT: 1 Visit                     Shirlene Doughty, PT DPT Acute Rehabilitation Services Secure Chat Preferred  Office [phone]    Kristina Long E Burnadette Carrion 11/02/2023, 4:53 PM

## 2023-11-02 NOTE — Inpatient Diabetes Management (Signed)
 Inpatient Diabetes Program Recommendations  AACE/ADA: New Consensus Statement on Inpatient Glycemic Control (2015)  Target Ranges:  Prepandial:   less than 140 mg/dL      Peak postprandial:   less than 180 mg/dL (1-2 hours)      Critically ill patients:  140 - 180 mg/dL   Lab Results  Component Value Date   GLUCAP 228 (H) 11/02/2023   HGBA1C 8.7 (H) 10/26/2023    Review of Glycemic Control  Latest Reference Range & Units 10/31/23 08:15 10/31/23 10:59 10/31/23 13:33 10/31/23 16:39 10/31/23 21:16 11/01/23 06:34 11/01/23 11:07  Glucose-Capillary 70 - 99 mg/dL 161 (H) 096 (H) 045 (H) 321 (H) 180 (H) 260 (H) 179 (H)    Latest Reference Range & Units 11/01/23 06:34 11/01/23 11:07 11/01/23 16:38 11/01/23 21:44 11/02/23 06:10  Glucose-Capillary 70 - 99 mg/dL 409 (H) 811 (H) 914 (H) 201 (H) 228 (H)   Diabetes history: DM 2 Outpatient Diabetes medications:  Januvia  100 mg daily, Metformin  1000 mg bid Current orders for Inpatient glycemic control:  Novolog  0-15 units tid with meals and HS Semglee  30 units daily Novolog  7 units tid with meals  Tradjenta  5 mg Daily A1c 8.7% on 5/2  Inpatient Diabetes Program Recommendations:    -   Consider increasing Semglee  to 35 units  Thanks,  Eloise Hake RN, MSN, BC-ADM Inpatient Diabetes Coordinator Team Pager [phone] (8a-5p)

## 2023-11-02 NOTE — Progress Notes (Signed)
 Neurosurgery called and left a message about the drainage noted at the incision site. Foam dressing was applied.

## 2023-11-02 NOTE — Anesthesia Postprocedure Evaluation (Signed)
 Anesthesia Post Note  Patient: Kristina Long  Procedure(s) Performed: LUMBAR LAMINECTOMY FOR EPIDURAL ABSCESS (Back)     Patient location during evaluation: PACU Anesthesia Type: General Level of consciousness: awake and alert Pain management: pain level controlled Vital Signs Assessment: post-procedure vital signs reviewed and stable Respiratory status: spontaneous breathing, nonlabored ventilation and respiratory function stable Cardiovascular status: blood pressure returned to baseline and stable Postop Assessment: no apparent nausea or vomiting Anesthetic complications: no   No notable events documented.                  Ercell Razon

## 2023-11-03 DIAGNOSIS — I059 Rheumatic mitral valve disease, unspecified: Secondary | ICD-10-CM | POA: Diagnosis not present

## 2023-11-03 LAB — CULTURE, BLOOD (ROUTINE X 2)
Culture: NO GROWTH
Special Requests: ADEQUATE

## 2023-11-03 LAB — CBC WITH DIFFERENTIAL/PLATELET
Abs Immature Granulocytes: 0.12 10*3/uL — ABNORMAL HIGH (ref 0.00–0.07)
Basophils Absolute: 0 10*3/uL (ref 0.0–0.1)
Basophils Relative: 0 %
Eosinophils Absolute: 0.2 10*3/uL (ref 0.0–0.5)
Eosinophils Relative: 2 %
HCT: 28.9 % — ABNORMAL LOW (ref 36.0–46.0)
Hemoglobin: 9.4 g/dL — ABNORMAL LOW (ref 12.0–15.0)
Immature Granulocytes: 1 %
Lymphocytes Relative: 29 %
Lymphs Abs: 3.2 10*3/uL (ref 0.7–4.0)
MCH: 27.4 pg (ref 26.0–34.0)
MCHC: 32.5 g/dL (ref 30.0–36.0)
MCV: 84.3 fL (ref 80.0–100.0)
Monocytes Absolute: 0.7 10*3/uL (ref 0.1–1.0)
Monocytes Relative: 7 %
Neutro Abs: 6.8 10*3/uL (ref 1.7–7.7)
Neutrophils Relative %: 61 %
Platelets: 372 10*3/uL (ref 150–400)
RBC: 3.43 MIL/uL — ABNORMAL LOW (ref 3.87–5.11)
RDW: 14.1 % (ref 11.5–15.5)
WBC: 11 10*3/uL — ABNORMAL HIGH (ref 4.0–10.5)
nRBC: 0 % (ref 0.0–0.2)

## 2023-11-03 LAB — AEROBIC/ANAEROBIC CULTURE W GRAM STAIN (SURGICAL/DEEP WOUND)

## 2023-11-03 LAB — RENAL FUNCTION PANEL
Albumin: 2.1 g/dL — ABNORMAL LOW (ref 3.5–5.0)
Anion gap: 7 (ref 5–15)
BUN: 12 mg/dL (ref 8–23)
CO2: 29 mmol/L (ref 22–32)
Calcium: 8.5 mg/dL — ABNORMAL LOW (ref 8.9–10.3)
Chloride: 100 mmol/L (ref 98–111)
Creatinine, Ser: 0.6 mg/dL (ref 0.44–1.00)
GFR, Estimated: >60 mL/min (ref >60)
Glucose, Bld: 179 mg/dL — ABNORMAL HIGH (ref 70–99)
Phosphorus: 3 mg/dL (ref 2.5–4.6)
Potassium: 4 mmol/L (ref 3.5–5.1)
Sodium: 136 mmol/L (ref 135–145)

## 2023-11-03 LAB — GLUCOSE, CAPILLARY
Glucose-Capillary: 204 mg/dL — ABNORMAL HIGH (ref 70–99)
Glucose-Capillary: 221 mg/dL — ABNORMAL HIGH (ref 70–99)
Glucose-Capillary: 130 mg/dL — ABNORMAL HIGH (ref 70–99)
Glucose-Capillary: 107 mg/dL — ABNORMAL HIGH (ref 70–99)

## 2023-11-03 LAB — MAGNESIUM: Magnesium: 2 mg/dL (ref 1.7–2.4)

## 2023-11-03 NOTE — TOC Progression Note (Signed)
 Transition of Care [facility]) - Progression Note    Patient Details  Name: Kristina Long MRN: [id number] Date of Birth: Jul 09, 1955  [facility] CM/SW Contact  Amaryllis Junior, [place] Phone Number: 11/03/2023, 11:43 AM  Clinical Narrative:    Pt recommended for SNF. CSW met with pt and pt spouse at bedside and pt is in agreement to recommendation. PASRR obtained [id number] A and FL2 completed. SNF referral faxed out and awaiting bed offers. Pt prefers [facility] if possible. TOC to follow up to review bed offers get bed choice.         Expected Discharge Plan and Services                                               Social Determinants of Health (SDOH) Interventions SDOH Screenings   Food Insecurity: No Food Insecurity (10/27/2023)  Housing: Low Risk  (10/27/2023)  Transportation Needs: No Transportation Needs (10/27/2023)  Utilities: Not At Risk (10/27/2023)  Social Connections: Unknown (10/27/2023)  Tobacco Use: Low Risk  (11/01/2023)    Readmission Risk Interventions     No data to display

## 2023-11-03 NOTE — Progress Notes (Signed)
 Mobility Specialist Progress Note:    11/03/23 1500  Mobility  Activity Ambulated with assistance in hallway  Level of Assistance Standby assist, set-up cues, supervision of patient - no hands on  Assistive Device Front wheel walker  Distance Ambulated (ft) 200 ft  Activity Response Tolerated well  Mobility Referral Yes  Mobility visit 1 Mobility  Mobility Specialist Start Time (ACUTE ONLY) 1502  Mobility Specialist Stop Time (ACUTE ONLY) 1520  Mobility Specialist Time Calculation (min) (ACUTE ONLY) 18 min   Received pt in chair having no complaints and agreeable to mobility. Pt was asymptomatic throughout ambulation and returned to room w/o fault. Successful void in BR. Left in chair w/ call bell in reach and all needs met.   D'Vante Nolon Baxter Mobility Specialist Please contact via Special educational needs teacher or Rehab office at [phone]

## 2023-11-03 NOTE — Plan of Care (Signed)
  Problem: Nutritional: Goal: Progress toward achieving an optimal weight will improve Outcome: Progressing   Problem: Health Behavior/Discharge Planning: Goal: Ability to manage health-related needs will improve Outcome: Progressing   Problem: Clinical Measurements: Goal: Ability to maintain clinical measurements within normal limits will improve Outcome: Progressing   Problem: Activity: Goal: Risk for activity intolerance will decrease Outcome: Progressing

## 2023-11-03 NOTE — NC FL2 (Signed)
 [place]  MEDICAID FL2 LEVEL OF CARE FORM     IDENTIFICATION  Patient Name: Kristina Long Birthdate: December 17, 1955 Sex: female Admission Date (Current Location): 10/26/2023  [facility] and [place] Number:  Producer, television/film/video and Address:  [facility] [place], [address], [place], [place] [address]      Provider Number: [id number]  Attending Physician Name and Address:  Doroteo Gasmen, MD  Relative Name and Phone Number:  CORKY, TANENBAUM (Spouse)  [phone]    Current Level of Care: Hospital Recommended Level of Care: Skilled Nursing Facility Prior Approval Number:    Date Approved/Denied:   PASRR Number: [id number]  Discharge Plan: SNF    Current Diagnoses: Patient Active Problem List   Diagnosis Date Noted   Bacteremia 11/02/2023   Endocarditis, valve unspecified 11/01/2023   Bacteremia due to Streptococcus pneumoniae 10/30/2023   Epidural abscess 10/29/2023   S/P lumbar laminectomy 10/29/2023   Acute encephalopathy 10/28/2023   Low back pain 10/27/2023   Right flank pain 10/27/2023   Dyslipidemia 07/12/2021   Multinodular goiter 07/12/2021   Type 2 diabetes mellitus without complication, without long-term current use of insulin  (HCC) 08/31/2020   Type 2 diabetes mellitus with hyperglycemia, with long-term current use of insulin  (HCC) 05/16/2019    Orientation RESPIRATION BLADDER Height & Weight     Self, Time, Situation, Place  Normal Continent Weight: 191 lb (86.6 kg) Height:  5\' 10"  (177.8 cm)  BEHAVIORAL SYMPTOMS/MOOD NEUROLOGICAL BOWEL NUTRITION STATUS      Continent Diet (Carb modified)  AMBULATORY STATUS COMMUNICATION OF NEEDS Skin   Limited Assist Verbally Surgical wounds                       Personal Care Assistance Level of Assistance  Feeding, Dressing, Bathing Bathing Assistance: Limited assistance Feeding assistance: Independent Dressing Assistance: Limited assistance     Functional Limitations Info   Sight, Hearing, Speech Sight Info: Impaired Hearing Info: Impaired Speech Info: Adequate    SPECIAL CARE FACTORS FREQUENCY  PT (By licensed PT), OT (By licensed OT)     PT Frequency: 5x/wk OT Frequency: 5x/wk            Contractures Contractures Info: Not present    Additional Factors Info  Code Status, Allergies Code Status Info: Full code Allergies Info: Blueberry (Vaccinium Angustifolium)           Current Medications (11/03/2023):  This is the current hospital active medication list Current Facility-Administered Medications  Medication Dose Route Frequency Provider Last Rate Last Admin   acetaminophen  (TYLENOL ) tablet 1,000 mg  1,000 mg Oral Q6H WA Gonfa, Taye T, MD   1,000 mg at 11/03/23 0848   amLODipine  (NORVASC ) tablet 10 mg  10 mg Oral Daily Joaquin Mulberry, MD   10 mg at 11/03/23 0848   atorvastatin  (LIPITOR) tablet 10 mg  10 mg Oral QHS Gonfa, Taye T, MD   10 mg at 11/02/23 2128   bisacodyl  (DULCOLAX) suppository 10 mg  10 mg Rectal Daily Joaquin Mulberry, MD   10 mg at 10/31/23 1340   cefTRIAXone  (ROCEPHIN ) 2 g in sodium chloride  0.9 % 100 mL IVPB  2 g Intravenous Q24H Singh, Mayanka, MD 200 mL/hr at 11/03/23 0508 2 g at 11/03/23 8469   celecoxib  (CELEBREX ) capsule 200 mg  200 mg Oral Q12H Joaquin Mulberry, MD   200 mg at 11/03/23 0848   haloperidol  lactate (HALDOL ) injection 2 mg  2 mg Intravenous Q6H  PRN Gonfa, Taye T, MD       HYDROmorphone  (DILAUDID ) injection 0.5 mg  0.5 mg Intravenous Q3H PRN Gonfa, Taye T, MD   0.5 mg at 11/03/23 0214   insulin  aspart (novoLOG ) injection 0-20 Units  0-20 Units Subcutaneous TID WC Gonfa, Taye T, MD   7 Units at 11/03/23 0847   insulin  aspart (novoLOG ) injection 0-5 Units  0-5 Units Subcutaneous QHS Joaquin Mulberry, MD   3 Units at 11/02/23 2141   insulin  aspart (novoLOG ) injection 7 Units  7 Units Subcutaneous TID WC Joaquin Mulberry, MD   7 Units at 11/03/23 1610   insulin  glargine-yfgn (SEMGLEE ) injection 30  Units  30 Units Subcutaneous Daily Gonfa, Taye T, MD   30 Units at 11/03/23 0847   labetalol  (NORMODYNE ) injection 10 mg  10 mg Intravenous Q2H PRN Joaquin Mulberry, MD       linagliptin  (TRADJENTA ) tablet 5 mg  5 mg Oral Daily Gonfa, Taye T, MD   5 mg at 11/03/23 0849   lisinopril  (ZESTRIL ) tablet 5 mg  5 mg Oral Daily Gonfa, Taye T, MD   5 mg at 11/03/23 0848   loratadine  (CLARITIN ) tablet 10 mg  10 mg Oral Daily PRN Joaquin Mulberry, MD       magnesium  citrate solution 1 Bottle  1 Bottle Oral Daily PRN Gonfa, Taye T, MD   1 Bottle at 10/30/23 1446   menthol -cetylpyridinium (CEPACOL) lozenge 3 mg  1 lozenge Oral PRN Joaquin Mulberry, MD       Or   phenol (CHLORASEPTIC) mouth spray 1 spray  1 spray Mouth/Throat PRN Joaquin Mulberry, MD       methocarbamol  (ROBAXIN ) tablet 500 mg  500 mg Oral Q6H PRN Joaquin Mulberry, MD   500 mg at 11/02/23 2129   Or   methocarbamol  (ROBAXIN ) injection 500 mg  500 mg Intravenous Q6H PRN Joaquin Mulberry, MD       ondansetron  (ZOFRAN ) tablet 4 mg  4 mg Oral Q6H PRN Joaquin Mulberry, MD       Or   ondansetron  (ZOFRAN ) injection 4 mg  4 mg Intravenous Q6H PRN Joaquin Mulberry, MD       Oral care mouth rinse  15 mL Mouth Rinse PRN Gonfa, Taye T, MD       oxyCODONE  (Oxy IR/ROXICODONE ) immediate release tablet 5 mg  5 mg Oral Q6H PRN Gonfa, Taye T, MD   5 mg at 11/03/23 0515   polyethylene glycol (MIRALAX  / GLYCOLAX ) packet 17 g  17 g Oral BID PRN Joaquin Mulberry, MD   17 g at 10/31/23 1719   senna-docusate (Senokot-S) tablet 2 tablet  2 tablet Oral BID Gonfa, Taye T, MD   2 tablet at 11/03/23 0848   sodium chloride  flush (NS) 0.9 % injection 3 mL  3 mL Intravenous Q12H Joaquin Mulberry, MD   3 mL at 11/03/23 0849   sodium chloride  flush (NS) 0.9 % injection 3 mL  3 mL Intravenous PRN Joaquin Mulberry, MD         Discharge Medications: Please see discharge summary for a list of discharge medications.  Relevant Imaging Results:  Relevant  Lab Results:   Additional Information SSN [id number]  Amaryllis Junior, LCSW

## 2023-11-03 NOTE — Progress Notes (Signed)
 PROGRESS NOTE  Kristina Long BJY:[id number] DOB: 10-23-1955   PCP: Arlys Berke, MD  Patient is from: Home.  Lives with husband.  Independently ambulates at baseline.  DOA: 10/26/2023 LOS: 7  Chief complaints Chief Complaint  Patient presents with   Drug Overdose     Brief Narrative / Interim history: As per prior documentation: "68 year old F with PMH of DM-2 returning to ED for the third time in a week with right back and flank pain and found to have epidural abscess and Streptococcus bacteremia.  MRI thoracic/lumbar spine concerning for epidural abscess extending from T11-L5 with resultant severe canal stenosis at L1-L2 and L2-L3 and moderate canal stenosis at L3-L4.    Patient underwent decompressive lumbar laminectomy, [name]  Blood and abscess culture with Streptococcus pneumoniae (was negative on 4/30).  Antibiotic de-escalated to ceftriaxone  per ID.  Repeat blood cultures NGTD.  TTE without significant finding.  Plan for TEE today".  11/02/2023: TEE revealed mitral valve endocarditis.  Cardiothoracic surgery team has been consulted.  Patient remains afebrile, and stable.  Resolving leukocytosis.  Seen alongside patient's husband and cousin.  All questions answered.  11/03/2023: Patient seen.  Cardiothoracic surgery input is appreciated.  Cardiothoracic surgery team has advised continued IV antibiotics follow-up.  Patient will follow-up with cardiothoracic team on discharge.  Patient is awaiting CIR.   Subjective: No complaints.  Objective: Vitals:   11/02/23 0732 11/02/23 2032 11/03/23 0413 11/03/23 0844  BP: 130/74 124/76 129/73 (!) 145/81  Pulse: 80 98 83 89  Resp: 19   15  Temp: 98 F (36.7 C) 98.7 F (37.1 C) 97.8 F (36.6 C) 98.7 F (37.1 C)  TempSrc:  Oral Oral Oral  SpO2: 98% 99% 98% 100%  Weight:      Height:        Examination:  GENERAL: No apparent  distress.  Awake and alert. HEENT: Mild pallor. NECK: Supple. .  Neuro: Awake and alert. RESP: Clear to auscultation. CVS: S1-S2. ABD/GI/GU: Obese, soft and nontender..  MSK/EXT: No leg edema..  Consultants:  Infectious disease. Cardiology for TEE. Neurosurgery to drain epidural abscess Cardiothoracic surgery for mitral valve endocarditis  Procedures: 5/5-decompressive lumbar laminectomy, medial facetectomy and foraminostomies from T12-L3 with evacuation of epidural abscess by Dr. Rochelle Chu   Microbiology summarized: 4/30-blood cultures NGTD 5/5-blood culture with Streptococcus pneumoniae 5/5-abscess culture with Streptococcus pneumoniae 5/6-repeat blood cultures NGTD  Assessment and plan: Epidural abscess with severe canal stenosis Streptococcus pneumoniae bacteremia Severe lower back due to the above -Presents with lower back pain radiating to right groin for days. -MRI thoracic or lumbar spine as above -CRP and ESR markedly elevated. -S/p surgical decompression by Dr. Rochelle Chu on 5/5 -TTE without significant finding. -Plan for TEE today -Pain and glycemic control -Bowel regimen to counteract constipation -PT/OT-, and CIR.  CIR following. -Appreciate help by neurosurgery and ID 510/2025: Continue antibiotics.  Acute encephalopathy/delirium: Some confusion overnight.  Well-oriented this morning. -Minimize sedating medications -Reorientation and delirium precaution -Needs safety sitter intermittently. 11/02/2023: Resolved.  Mitral valve endocarditis: - TEE reveals mitral valve vegetation. -Cardiothoracic surgery team has advised continuing antibiotics.  Patient will follow-up with cardiothoracic team on discharge.   - Continue antibiotics.  Uncontrolled NIDDM-2 with hyperglycemia: A1c 8.7%.  On metformin  and Jardiance  at home Recent Labs  Lab 11/02/23 1148 11/02/23 1721 11/02/23 2031 11/03/23 0843 11/03/23 1138  GLUCAP 165* 169* 265* 204* 221*  -Continue  [name]-resistant scale -Continue  Semglee  30 units daily -Continue NovoLog  7 units 3 times daily with meals -Add Tradjenta  instead of home Januvia  -Further adjustment as appropriate  Essential hypertension:  -Continue amlodipine -started here - Resume home lisinopril  5 mg daily - Pain control 11/03/2023: Blood pressure is controlled.  Proteinuria -Continue home lisinopril   Leukocytosis/bandemia: Likely due to the above.  Improving. - Antibiotics per ID 11/03/2023: WBC is down to 11.  Hyponatremia: - Mild - Continue to monitor.   - Resolved.  Body mass index is 27.41 kg/m.           DVT prophylaxis:  SCD's Start: 10/29/23 1856 Pharmacologic VTE prophylaxis once okay from surgical standpoint  Code Status: Full code Family Communication: Husband at bedside. Level of care: Med-Surg Status is: Inpatient Remains inpatient appropriate because: Epidural abscess and bacteremia   Final disposition: CIR?   55 minutes with more than 50% spent in reviewing records, counseling patient/family and coordinating care.   Sch Meds:  Scheduled Meds:  acetaminophen   1,000 mg Oral Q6H WA   amLODipine   10 mg Oral Daily   atorvastatin   10 mg Oral QHS   bisacodyl   10 mg Rectal Daily   celecoxib   200 mg Oral Q12H   insulin  aspart  0-20 Units Subcutaneous TID WC   insulin  aspart  0-5 Units Subcutaneous QHS   insulin  aspart  7 Units Subcutaneous TID WC   insulin  glargine-yfgn  30 Units Subcutaneous Daily   linagliptin   5 mg Oral Daily   lisinopril   5 mg Oral Daily   senna-docusate  2 tablet Oral BID   sodium chloride  flush  3 mL Intravenous Q12H   Continuous Infusions:  cefTRIAXone  (ROCEPHIN )  IV 2 g (11/03/23 0508)   PRN Meds:.haloperidol  lactate, HYDROmorphone  (DILAUDID ) injection, labetalol , loratadine , magnesium  citrate, menthol -cetylpyridinium **OR** phenol, methocarbamol  **OR** methocarbamol  (ROBAXIN ) injection, ondansetron  **OR** ondansetron  (ZOFRAN ) IV, mouth rinse, oxyCODONE ,  [COMPLETED] polyethylene glycol **FOLLOWED BY** polyethylene glycol, sodium chloride  flush  Antimicrobials: Anti-infectives (From admission, onward)    Start     Dose/Rate Route Frequency Ordered Stop   10/31/23 0600  cefTRIAXone  (ROCEPHIN ) 2 g in sodium chloride  0.9 % 100 mL IVPB        2 g 200 mL/hr over 30 Minutes Intravenous Every 24 hours 10/30/23 1012     10/30/23 1000  vancomycin  (VANCOREADY) IVPB 500 mg/100 mL  Status:  Discontinued        500 mg 100 mL/hr over 60 Minutes Intravenous Every 24 hours 10/29/23 1915 10/30/23 1012   10/30/23 1000  cefTRIAXone  (ROCEPHIN ) 2 g in sodium chloride  0.9 % 100 mL IVPB  Status:  Discontinued        2 g 200 mL/hr over 30 Minutes Intravenous Every 12 hours 10/29/23 2202 10/29/23 2203   10/30/23 0600  cefTRIAXone  (ROCEPHIN ) 2 g in sodium chloride  0.9 % 100 mL IVPB  Status:  Discontinued        2 g 200 mL/hr over 30 Minutes Intravenous Every 12 hours 10/30/23 0457 10/30/23 1012   10/29/23 2300  metroNIDAZOLE  (FLAGYL ) IVPB 500 mg  Status:  Discontinued        500 mg 100 mL/hr over 60 Minutes Intravenous Every 12 hours 10/29/23 2202 10/30/23 0457   10/29/23 2300  ceFEPIme  (MAXIPIME ) 2 g in sodium chloride  0.9 % 100 mL IVPB  Status:  Discontinued        2 g 200 mL/hr over 30 Minutes Intravenous Every 12 hours 10/29/23 2209 10/30/23 0457   10/29/23 2200  cefTRIAXone  (ROCEPHIN ) 1 g  in sodium chloride  0.9 % 100 mL IVPB  Status:  Discontinued        1 g 200 mL/hr over 30 Minutes Intravenous Every 12 hours 10/29/23 1855 10/29/23 2202   10/29/23 1424  vancomycin  (VANCOCIN ) powder  Status:  Discontinued          As needed 10/29/23 1424 10/29/23 1454   10/29/23 1130  cefTRIAXone  (ROCEPHIN ) 2 g in sodium chloride  0.9 % 100 mL IVPB        2 g 200 mL/hr over 30 Minutes Intravenous  Once 10/29/23 1115 10/29/23 1255   10/29/23 1130  vancomycin  (VANCOCIN ) IVPB 1000 mg/200 mL premix        1,000 mg 200 mL/hr over 60 Minutes Intravenous  Once 10/29/23 1115  10/29/23 1230   10/29/23 1119  sodium chloride  0.9 % with cefTRIAXone  (ROCEPHIN ) ADS Med       Note to Pharmacy: Franceen Inches: cabinet override      10/29/23 1119 10/29/23 1310   10/29/23 1119  vancomycin  (VANCOCIN ) 1-5 GM/200ML-% IVPB       Note to Pharmacy: Franceen Inches: cabinet override      10/29/23 1119 10/29/23 1241        I have personally reviewed the following labs and images: CBC: Recent Labs  Lab 10/30/23 0621 10/31/23 0534 11/01/23 1053 11/02/23 0609 11/03/23 0550  WBC 17.7* 21.1* 17.1* 13.0* 11.0*  NEUTROABS  --   --   --   --  6.8  HGB 11.3* 10.7* 10.1* 9.3* 9.4*  HCT 34.1* 32.8* 30.9* 29.2* 28.9*  MCV 82.8 83.7 83.7 84.6 84.3  PLT 365 327 344 315 372   BMP &GFR Recent Labs  Lab 10/30/23 0621 10/31/23 0534 11/01/23 1053 11/02/23 0609 11/03/23 0550  NA 134* 139 136 136 136  K 3.7 4.1 3.8 4.2 4.0  CL 95* 100 97* 98 100  CO2 23 28 28 25 29   GLUCOSE 303* 190* 182* 226* 179*  BUN 23 17 15 16 12   CREATININE 0.81 0.79 0.57 0.74 0.60  CALCIUM  9.1 9.0 8.9 8.7* 8.5*  MG 1.9 2.1 2.1 2.0 2.0  PHOS 2.4* 2.5 2.3* 3.7 3.0   Estimated Creatinine Clearance: 79.3 mL/min (by C-G formula based on SCr of 0.6 mg/dL). Liver & Pancreas: Recent Labs  Lab 10/30/23 0621 10/31/23 0534 11/01/23 1053 11/02/23 0609 11/03/23 0550  ALBUMIN 2.4* 2.3* 2.3* 2.0* 2.1*   No results for input(s): "LIPASE", "AMYLASE" in the last 168 hours.  No results for input(s): "AMMONIA" in the last 168 hours. Diabetic: No results for input(s): "HGBA1C" in the last 72 hours.  Recent Labs  Lab 11/02/23 1148 11/02/23 1721 11/02/23 2031 11/03/23 0843 11/03/23 1138  GLUCAP 165* 169* 265* 204* 221*   Cardiac Enzymes: No results for input(s): "CKTOTAL", "CKMB", "CKMBINDEX", "TROPONINI" in the last 168 hours. No results for input(s): "PROBNP" in the last 8760 hours. Coagulation Profile: No results for input(s): "INR", "PROTIME" in the last 168 hours.  Thyroid  Function Tests: No  results for input(s): "TSH", "T4TOTAL", "FREET4", "T3FREE", "THYROIDAB" in the last 72 hours. Lipid Profile: No results for input(s): "CHOL", "HDL", "LDLCALC", "TRIG", "CHOLHDL", "LDLDIRECT" in the last 72 hours.  Anemia Panel: No results for input(s): "VITAMINB12", "FOLATE", "FERRITIN", "TIBC", "IRON", "RETICCTPCT" in the last 72 hours. Urine analysis:    Component Value Date/Time   COLORURINE YELLOW 10/26/2023 2027   APPEARANCEUR CLEAR 10/26/2023 2027   LABSPEC 1.031 (H) 10/26/2023 2027   PHURINE 5.0 10/26/2023 2027   GLUCOSEU >=500 (  A) 10/26/2023 2027   HGBUR MODERATE (A) 10/26/2023 2027   BILIRUBINUR NEGATIVE 10/26/2023 2027   KETONESUR 20 (A) 10/26/2023 2027   PROTEINUR >=300 (A) 10/26/2023 2027   NITRITE NEGATIVE 10/26/2023 2027   LEUKOCYTESUR NEGATIVE 10/26/2023 2027   Sepsis Labs: Invalid input(s): "PROCALCITONIN", "LACTICIDVEN"  Microbiology: Recent Results (from the past 240 hours)  Resp panel by RT-PCR (RSV, Flu A&B, Covid) Anterior Nasal Swab     Status: None   Collection Time: 10/24/23  3:57 PM   Specimen: Anterior Nasal Swab  Result Value Ref Range Status   SARS Coronavirus 2 by RT PCR NEGATIVE NEGATIVE Final    Comment: (NOTE) SARS-CoV-2 target nucleic acids are NOT DETECTED.  The SARS-CoV-2 RNA is generally detectable in upper respiratory specimens during the acute phase of infection. The lowest concentration of SARS-CoV-2 viral copies this assay can detect is 138 copies/mL. A negative result does not preclude SARS-Cov-2 infection and should not be used as the sole basis for treatment or other patient management decisions. A negative result may occur with  improper specimen collection/handling, submission of specimen other than nasopharyngeal swab, presence of viral mutation(s) within the areas targeted by this assay, and inadequate number of viral copies(<138 copies/mL). A negative result must be combined with clinical observations, patient history, and  epidemiological information. The expected result is Negative.  Fact Sheet for Patients:  BloggerCourse.com  Fact Sheet for Healthcare Providers:  SeriousBroker.it  This test is no t yet approved or cleared by the [place]  FDA and  has been authorized for detection and/or diagnosis of SARS-CoV-2 by FDA under an Emergency Use Authorization (EUA). This EUA will remain  in effect (meaning this test can be used) for the duration of the COVID-19 declaration under Section 564(b)(1) of the Act, 21 U.S.C.section 360bbb-3(b)(1), unless the authorization is terminated  or revoked sooner.       Influenza A by PCR NEGATIVE NEGATIVE Final   Influenza B by PCR NEGATIVE NEGATIVE Final    Comment: (NOTE) The Xpert Xpress SARS-CoV-2/FLU/RSV plus assay is intended as an aid in the diagnosis of influenza from Nasopharyngeal swab specimens and should not be used as a sole basis for treatment. Nasal washings and aspirates are unacceptable for Xpert Xpress SARS-CoV-2/FLU/RSV testing.  Fact Sheet for Patients: BloggerCourse.com  Fact Sheet for Healthcare Providers: SeriousBroker.it  This test is not yet approved or cleared by the [place]  FDA and has been authorized for detection and/or diagnosis of SARS-CoV-2 by FDA under an Emergency Use Authorization (EUA). This EUA will remain in effect (meaning this test can be used) for the duration of the COVID-19 declaration under Section 564(b)(1) of the Act, 21 U.S.C. section 360bbb-3(b)(1), unless the authorization is terminated or revoked.     Resp Syncytial Virus by PCR NEGATIVE NEGATIVE Final    Comment: (NOTE) Fact Sheet for Patients: BloggerCourse.com  Fact Sheet for Healthcare Providers: SeriousBroker.it  This test is not yet approved or cleared by the [place]  FDA and has been  authorized for detection and/or diagnosis of SARS-CoV-2 by FDA under an Emergency Use Authorization (EUA). This EUA will remain in effect (meaning this test can be used) for the duration of the COVID-19 declaration under Section 564(b)(1) of the Act, 21 U.S.C. section 360bbb-3(b)(1), unless the authorization is terminated or revoked.  Performed at [facility], [address] [place], [place] [address]   Surgical pcr screen     Status: None   Collection Time: 10/29/23 10:02 AM   Specimen:  Nasal Mucosa; Nasal Swab  Result Value Ref Range Status   MRSA, PCR NEGATIVE NEGATIVE Final   Staphylococcus aureus NEGATIVE NEGATIVE Final    Comment: (NOTE) The Xpert SA Assay (FDA approved for NASAL specimens in patients 72 years of age and older), is one component of a comprehensive surveillance program. It is not intended to diagnose infection nor to guide or monitor treatment. Performed at [facility], [address], [place], [place] [address]   Culture, blood (Routine X 2) w Reflex to ID Panel     Status: None   Collection Time: 10/29/23 10:18 AM   Specimen: BLOOD RIGHT ARM  Result Value Ref Range Status   Specimen Description BLOOD RIGHT ARM  Final   Special Requests   Final    BOTTLES DRAWN AEROBIC AND ANAEROBIC Blood Culture adequate volume   Culture   Final    NO GROWTH 5 DAYS Performed at [facility], [address], [place], [place] [address]    Report Status 11/03/2023 FINAL  Final  Culture, blood (Routine X 2) w Reflex to ID Panel     Status: Abnormal   Collection Time: 10/29/23 10:27 AM   Specimen: BLOOD LEFT ARM  Result Value Ref Range Status   Specimen Description BLOOD LEFT ARM  Final   Special Requests   Final    BOTTLES DRAWN AEROBIC AND ANAEROBIC BACTEROIDES CACCAE   Culture  Setup Time   Final    GRAM POSITIVE COCCI IN PAIRS IN CHAINS ANAEROBIC BOTTLE ONLY CRITICAL RESULT CALLED TO, READ BACK BY AND VERIFIED WITH: PHARMD J  LEDFORD 10/30/2023 @ 0440 BY AB Performed at [facility], [address] [place], [place] [address]    Culture STREPTOCOCCUS PNEUMONIAE (A)  Final   Report Status 11/01/2023 FINAL  Final   Organism ID, Bacteria STREPTOCOCCUS PNEUMONIAE  Final      Susceptibility   Streptococcus pneumoniae - MIC*    ERYTHROMYCIN <=0.12 SENSITIVE Sensitive     LEVOFLOXACIN 1 SENSITIVE Sensitive     VANCOMYCIN  0.5 SENSITIVE Sensitive     PENICILLIN (meningitis) <=0.06 SENSITIVE Sensitive     PENO - penicillin <=0.06      PENICILLIN (non-meningitis) <=0.06 SENSITIVE Sensitive     PENICILLIN (oral) <=0.06 SENSITIVE Sensitive     CEFTRIAXONE  (non-meningitis) <=0.12 SENSITIVE Sensitive     CEFTRIAXONE  (meningitis) <=0.12 SENSITIVE Sensitive     * STREPTOCOCCUS PNEUMONIAE  Blood Culture ID Panel (Reflexed)     Status: Abnormal   Collection Time: 10/29/23 10:27 AM  Result Value Ref Range Status   Enterococcus faecalis NOT DETECTED NOT DETECTED Final   Enterococcus Faecium NOT DETECTED NOT DETECTED Final   Listeria monocytogenes NOT DETECTED NOT DETECTED Final   Staphylococcus species NOT DETECTED NOT DETECTED Final   Staphylococcus aureus (BCID) NOT DETECTED NOT DETECTED Final   Staphylococcus epidermidis NOT DETECTED NOT DETECTED Final   Staphylococcus lugdunensis NOT DETECTED NOT DETECTED Final   Streptococcus species DETECTED (A) NOT DETECTED Final    Comment: CRITICAL RESULT CALLED TO, READ BACK BY AND VERIFIED WITH: PHARMD J LEDFORD 10/30/2023 @ 0440 BY AB    Streptococcus agalactiae NOT DETECTED NOT DETECTED Final   Streptococcus pneumoniae DETECTED (A) NOT DETECTED Final    Comment: CRITICAL RESULT CALLED TO, READ BACK BY AND VERIFIED WITH: PHARMD J LEDFORD 10/30/2023 @ 0440 BY AB    Streptococcus pyogenes NOT DETECTED NOT DETECTED Final   A.calcoaceticus-baumannii NOT DETECTED NOT DETECTED Final   Bacteroides  fragilis NOT DETECTED NOT DETECTED Final   Enterobacterales NOT DETECTED NOT  DETECTED Final   Enterobacter cloacae complex NOT DETECTED NOT DETECTED Final   Escherichia coli NOT DETECTED NOT DETECTED Final   Klebsiella aerogenes NOT DETECTED NOT DETECTED Final   Klebsiella oxytoca NOT DETECTED NOT DETECTED Final   Klebsiella pneumoniae NOT DETECTED NOT DETECTED Final   Proteus species NOT DETECTED NOT DETECTED Final   Salmonella species NOT DETECTED NOT DETECTED Final   Serratia marcescens NOT DETECTED NOT DETECTED Final   Haemophilus influenzae NOT DETECTED NOT DETECTED Final   Neisseria meningitidis NOT DETECTED NOT DETECTED Final   Pseudomonas aeruginosa NOT DETECTED NOT DETECTED Final   Stenotrophomonas maltophilia NOT DETECTED NOT DETECTED Final   Candida albicans NOT DETECTED NOT DETECTED Final   Candida auris NOT DETECTED NOT DETECTED Final   Candida glabrata NOT DETECTED NOT DETECTED Final   Candida krusei NOT DETECTED NOT DETECTED Final   Candida parapsilosis NOT DETECTED NOT DETECTED Final   Candida tropicalis NOT DETECTED NOT DETECTED Final   Cryptococcus neoformans/gattii NOT DETECTED NOT DETECTED Final    Comment: Performed at [facility], [address], [place], [place] [address]  Aerobic/Anaerobic Culture w Gram Stain (surgical/deep wound)     Status: None   Collection Time: 10/29/23  1:13 PM   Specimen: PATH Soft tissue  Result Value Ref Range Status   Specimen Description ABSCESS  Final   Special Requests LUMBAR ABSCESS NO 1  Final   Gram Stain   Final    FEW WBC PRESENT, PREDOMINANTLY PMN RARE GRAM POSITIVE COCCI IN PAIRS    Culture   Final    FEW STREPTOCOCCUS PNEUMONIAE SUSCEPTIBILITIES PERFORMED ON PREVIOUS CULTURE WITHIN THE LAST 5 DAYS. NO ANAEROBES ISOLATED Performed at [facility], [address], [place], [place] [address]    Report Status 11/03/2023 FINAL  Final  Aerobic/Anaerobic Culture w Gram Stain (surgical/deep wound)     Status: None   Collection Time: 10/29/23  1:23 PM   Specimen: PATH Soft tissue   Result Value Ref Range Status   Specimen Description ABSCESS  Final   Special Requests LUMBAR ABSCESS NO 2  Final   Gram Stain   Final    ABUNDANT WBC PRESENT, PREDOMINANTLY PMN FEW GRAM POSITIVE COCCI IN PAIRS    Culture   Final    MODERATE STREPTOCOCCUS PNEUMONIAE NO ANAEROBES ISOLATED Performed at [facility], [address], [place], [place] [address]    Report Status 11/03/2023 FINAL  Final   Organism ID, Bacteria STREPTOCOCCUS PNEUMONIAE  Final      Susceptibility   Streptococcus pneumoniae - MIC*    ERYTHROMYCIN <=0.12 SENSITIVE Sensitive     LEVOFLOXACIN 1 SENSITIVE Sensitive     VANCOMYCIN  0.5 SENSITIVE Sensitive     PENICILLIN (meningitis) <=0.06 SENSITIVE Sensitive     PENO - penicillin <=0.06      PENICILLIN (non-meningitis) <=0.06 SENSITIVE Sensitive     PENICILLIN (oral) <=0.06 SENSITIVE Sensitive     CEFTRIAXONE  (non-meningitis) <=0.12 SENSITIVE Sensitive     CEFTRIAXONE  (meningitis) <=0.12 SENSITIVE Sensitive     * MODERATE STREPTOCOCCUS PNEUMONIAE  Aerobic/Anaerobic Culture w Gram Stain (surgical/deep wound)     Status: None   Collection Time: 10/29/23  1:30 PM   Specimen: PATH Soft tissue  Result Value Ref Range Status   Specimen Description ABSCESS  Final   Special Requests LUMBAR ABSCESS NO 3  Final   Gram Stain   Final  RARE WBC PRESENT, PREDOMINANTLY PMN RARE GRAM POSITIVE COCCI IN PAIRS    Culture   Final    RARE STREPTOCOCCUS PNEUMONIAE SUSCEPTIBILITIES PERFORMED ON PREVIOUS CULTURE WITHIN THE LAST 5 DAYS. NO ANAEROBES ISOLATED Performed at [facility], [address], [place], [place] [address]    Report Status 11/03/2023 FINAL  Final  Culture, blood (Routine X 2) w Reflex to ID Panel     Status: None (Preliminary result)   Collection Time: 10/30/23  5:48 PM   Specimen: BLOOD LEFT ARM  Result Value Ref Range Status   Specimen Description BLOOD LEFT ARM  Final   Special Requests   Final    BOTTLES DRAWN AEROBIC AND ANAEROBIC  Blood Culture adequate volume   Culture   Final    NO GROWTH 4 DAYS Performed at [facility], [address], [place], [place] [address]    Report Status PENDING  Incomplete  Culture, blood (Routine X 2) w Reflex to ID Panel     Status: None (Preliminary result)   Collection Time: 10/30/23  6:07 PM   Specimen: BLOOD LEFT ARM  Result Value Ref Range Status   Specimen Description BLOOD LEFT ARM  Final   Special Requests   Final    AEROBIC BOTTLE ONLY Blood Culture results may not be optimal due to an inadequate volume of blood received in culture bottles   Culture   Final    NO GROWTH 4 DAYS Performed at [facility], [address] [place], [place] [address]    Report Status PENDING  Incomplete    Radiology Studies: No results found.      Fonnie Iba, MD  [facility]  If 7PM-7AM, please contact night-coverage [email or web address] 11/03/2023, 3:28 PM

## 2023-11-03 NOTE — Progress Notes (Signed)
 Subjective: The patient is alert and pleasant.  He is in no apparent distress.  Objective: Vital signs in last 24 hours: Temp:  [97.8 F (36.6 C)-98.7 F (37.1 C)] 98.7 F (37.1 C) (05/10 0844) Pulse Rate:  [83-98] 89 (05/10 0844) Resp:  [15] 15 (05/10 0844) BP: (124-145)/(73-81) 145/81 (05/10 0844) SpO2:  [98 %-100 %] 100 % (05/10 0844) Estimated body mass index is 27.41 kg/m as calculated from the following:   Height as of this encounter: 5\' 10"  (1.778 m).   Weight as of this encounter: 86.6 kg.   Intake/Output from previous day: No intake/output data recorded. Intake/Output this shift No intake/output data recorded.  Physical exam the patient is alert and pleasant.  Her dressing has a small amount of drainage on it.  Lab Results: Recent Labs    11/02/23 0609 11/03/23 0550  WBC 13.0* 11.0*  HGB 9.3* 9.4*  HCT 29.2* 28.9*  PLT 315 372   BMET Recent Labs    11/02/23 0609 11/03/23 0550  NA 136 136  K 4.2 4.0  CL 98 100  CO2 25 29  GLUCOSE 226* 179*  BUN 16 12  CREATININE 0.74 0.60  CALCIUM  8.7* 8.5*    Studies/Results: DG Orthopantogram Result Date: 11/02/2023 CLINICAL DATA:  Infection.  Jaw pain. EXAM: ORTHOPANTOGRAM/PANORAMIC COMPARISON:  None Available. FINDINGS: No fracture or lytic lesion is noted.  Poor dentition is noted. IMPRESSION: No acute abnormality seen. Electronically Signed   By: Rosalene Colon M.D.   On: 11/02/2023 13:41   ECHO TEE Result Date: 11/01/2023    TRANSESOPHOGEAL ECHO REPORT   Patient Name:   Ainara Artz Date of Exam: 11/01/2023 Medical Rec #:  [id number]      Height:       70.0 in Accession #:    [id number]     Weight:       191.0 lb Date of Birth:  30-Jun-1955     BSA:          2.047 m Patient Age:    1 years       BP: Patient Gender: F              HR: Exam Location:  Inpatient Procedure: 2D Echo (Both Spectral and Color Flow Doppler were utilized during            procedure). Indications:    bacteremia  History:        Patient has  prior history of Echocardiogram examinations.  Sonographer:    Hersey Lorenzo Referring Phys: [id number] HAO MENG PROCEDURE: The transesophogeal probe was passed without difficulty through the esophogus of the patient. Sedation performed by different physician. The patient's vital signs; including heart rate, blood pressure, and oxygen  saturation; remained stable throughout the procedure. The patient developed no complications during the procedure.  IMPRESSIONS  1. Left ventricular ejection fraction, by estimation, is 60 to 65%. The left ventricle has normal function. The left ventricle has no regional wall motion abnormalities.  2. Right ventricular systolic function is normal. The right ventricular size is normal.  3. No left atrial/left atrial appendage thrombus was detected.  4. There is a mobile mass ( 1.51 cm x 0.482 cm) on the anterior mitral valve leaflet. Then another mobile mass ( 0.509 cm x 0.532 cm) on th posterior mitral leaflet. The mitral valve is abnormal. Trivial mitral valve regurgitation. No evidence of mitral  stenosis.  5. The aortic valve is normal in structure. There is moderate thickening of the aortic valve.  Aortic valve regurgitation is mild. No aortic stenosis is present.  6. The inferior vena cava is normal in size with greater than 50% respiratory variability, suggesting right atrial pressure of 3 mmHg. Conclusion(s)/Recommendation(s): Findings are concerning for vegetation/infective endocarditis as detailed above. FINDINGS  Left Ventricle: Left ventricular ejection fraction, by estimation, is 60 to 65%. The left ventricle has normal function. The left ventricle has no regional wall motion abnormalities. The left ventricular internal cavity size was normal in size. There is  no left ventricular hypertrophy. Right Ventricle: The right ventricular size is normal. No increase in right ventricular wall thickness. Right ventricular systolic function is normal. Left Atrium: Left atrial size was  normal in size. No left atrial/left atrial appendage thrombus was detected. Right Atrium: Right atrial size was normal in size. Pericardium: There is no evidence of pericardial effusion. Mitral Valve: There is a mobile mass ( 1.51 cm x 0.482 cm) on the anterior mitral valve leaflet. Then another mobile mass ( 0.509 cm x 0.532 cm) on th posterior mitral leaflet. The mitral valve is abnormal. Trivial mitral valve regurgitation. No evidence  of mitral valve stenosis. Tricuspid Valve: The tricuspid valve is normal in structure. Tricuspid valve regurgitation is not demonstrated. No evidence of tricuspid stenosis. Aortic Valve: The aortic valve is normal in structure. There is moderate thickening of the aortic valve. Aortic valve regurgitation is mild. No aortic stenosis is present. Pulmonic Valve: The pulmonic valve was normal in structure. Pulmonic valve regurgitation is not visualized. No evidence of pulmonic stenosis. Aorta: The aortic root is normal in size and structure. Pulmonary Artery: The pulmonary artery is not well seen. Venous: The left upper pulmonary vein, left lower pulmonary vein, right upper pulmonary vein and right lower pulmonary vein are normal. A normal flow pattern is recorded from the right upper pulmonary vein. The inferior vena cava is normal in size with greater than 50% respiratory variability, suggesting right atrial pressure of 3 mmHg. IAS/Shunts: No atrial level shunt detected by color flow Doppler.   AORTA Ao Asc diam: 2.80 cm Chyrl Crawford Tobb DO Electronically signed by Jerryl Morin DO Signature Date/Time: 11/01/2023/4:24:08 PM    Final    EP STUDY Result Date: 11/01/2023 See surgical note for result.   Assessment/Plan: Postop day #5: We are awaiting rehab placement.  She continues antibiotics.  LOS: 7 days     Elder Greening 11/03/2023, 9:22 AM     Patient ID: Kristina Long, female   DOB: 1955-08-22, 68 y.o.   MRN: [id number]

## 2023-11-03 NOTE — Progress Notes (Signed)
 Mobility Specialist Progress Note:    11/03/23 1100  Mobility  Activity Ambulated with assistance to bathroom  Level of Assistance Contact guard assist, steadying assist  Assistive Device Front wheel walker  Distance Ambulated (ft) 15 ft  Activity Response Tolerated well  Mobility Referral Yes  Mobility visit 1 Mobility  Mobility Specialist Start Time (ACUTE ONLY) 1130  Mobility Specialist Stop Time (ACUTE ONLY) 1135  Mobility Specialist Time Calculation (min) (ACUTE ONLY) 5 min   Pt received on EOB, requesting assistance to BR. Able to stand and ambulate w/ just contact guard. No complaints throughout. Pt left in BR w/ instructions to pull call string. Family present.  Kristina Long Mobility Specialist Please contact via Special educational needs teacher or Rehab office at [phone]

## 2023-11-04 DIAGNOSIS — I059 Rheumatic mitral valve disease, unspecified: Secondary | ICD-10-CM | POA: Diagnosis not present

## 2023-11-04 LAB — CULTURE, BLOOD (ROUTINE X 2)
Culture: NO GROWTH
Culture: NO GROWTH
Special Requests: ADEQUATE

## 2023-11-04 LAB — GLUCOSE, CAPILLARY
Glucose-Capillary: 149 mg/dL — ABNORMAL HIGH (ref 70–99)
Glucose-Capillary: 171 mg/dL — ABNORMAL HIGH (ref 70–99)
Glucose-Capillary: 206 mg/dL — ABNORMAL HIGH (ref 70–99)
Glucose-Capillary: 118 mg/dL — ABNORMAL HIGH (ref 70–99)

## 2023-11-04 NOTE — Progress Notes (Signed)
 Mobility Specialist Progress Note:    11/04/23 1100  Mobility  Activity Ambulated with assistance in hallway  Level of Assistance Contact guard assist, steadying assist  Assistive Device None  Distance Ambulated (ft) 200 ft  Activity Response Tolerated well  Mobility Referral Yes  Mobility visit 1 Mobility  Mobility Specialist Start Time (ACUTE ONLY) 1124  Mobility Specialist Stop Time (ACUTE ONLY) 1135  Mobility Specialist Time Calculation (min) (ACUTE ONLY) 11 min   Pt received on EOB, eager for mobility. Required contact guard while ambulating w/ no assistive device. No complaints throughout. Pt left on EOB with call bell and family present.  Kristina Long Mobility Specialist Please contact via Special educational needs teacher or Rehab office at [phone]

## 2023-11-04 NOTE — TOC Progression Note (Signed)
 Transition of Care [facility]) - Progression Note    Patient Details  Name: Kristina Long MRN: [id number] Date of Birth: 11-16-1955  [facility] CM/SW Contact  Adline Hook, [place] Phone Number: 11/04/2023, 2:57 PM  Clinical Narrative:     CSW met with patient, spouse, and 2 guests at bedside. CSW gave bed offers. Patient would like to wait for other bed offers. Patient would prefer [facility], [facility] or [facility].        Expected Discharge Plan and Services                                               Social Determinants of Health (SDOH) Interventions SDOH Screenings   Food Insecurity: No Food Insecurity (10/27/2023)  Housing: Low Risk  (10/27/2023)  Transportation Needs: No Transportation Needs (10/27/2023)  Utilities: Not At Risk (10/27/2023)  Social Connections: Unknown (10/27/2023)  Tobacco Use: Low Risk  (11/01/2023)    Readmission Risk Interventions     No data to display

## 2023-11-04 NOTE — Plan of Care (Signed)
  Problem: Coping: Goal: Ability to adjust to condition or change in health will improve Outcome: Progressing   Problem: Skin Integrity: Goal: Risk for impaired skin integrity will decrease Outcome: Progressing   Problem: Activity: Goal: Risk for activity intolerance will decrease Outcome: Progressing   Problem: Safety: Goal: Ability to remain free from injury will improve Outcome: Progressing

## 2023-11-04 NOTE — Progress Notes (Signed)
 Patient ID: Kristina Long, female   DOB: [birth year], 68 y.o.   MRN: [id number] BP 137/76 (BP Location: Right Arm)   Pulse 97   Temp 98.3 F (36.8 C) (Oral)   Resp 16   Ht 5\' 10"  (1.778 m)   Wt 86.6 kg   LMP  (LMP Unknown)   SpO2 100%   BMI 27.41 kg/m  Alert and oriented x 4 Moving lower extremities well Wound is clean,and dry Awaiting placement

## 2023-11-04 NOTE — Progress Notes (Signed)
 PROGRESS NOTE  Kristina Long YNW:[id number] DOB: May 10, 1956   PCP: Arlys Berke, MD  Patient is from: Home.  Lives with husband.  Independently ambulates at baseline.  DOA: 10/26/2023 LOS: 8  Chief complaints Chief Complaint  Patient presents with   Drug Overdose     Brief Narrative / Interim history: Patient is a 68 year old female with past medical history significant for type 2 diabetes mellitus.  Patient presented to the emergency room with right sided back and flank pain.  MRI of the thoracic/lumbar spine was concerning for epidural abscess extending from T11-L5 with resultant severe canal stenosis at L1-L2 and L2-L3 and moderate canal stenosis at L3-L4.  Patient underwent decompressive lumbar laminectomy, medial facetectomy and foraminostomies from T12-L3 with evacuation of epidural abscess by Dr. Rochelle Chu, neurosurgeon, on 10/29/2023.  Blood culture grew Streptococcus pneumoniae.  Patient is on IV ceftriaxone  2 g daily.  TEE revealed mitral valve endocarditis.  Cardiothoracic team has advised IV antibiotics for now.  Cardiothoracic team will follow patient on discharge.  Patient will likely need repeat TEE at some point.  Input from the infectious disease team is highly appreciated.  11/04/2023: Patient seen alongside patient's husband.  No new complaints.  Awaiting CIR.  Antibiotics as per infectious disease team.  Subjective: No complaints.  Objective: Vitals:   11/03/23 1950 11/04/23 0500 11/04/23 0540 11/04/23 0757  BP: 132/74  (!) 145/74 137/76  Pulse: 96  91 97  Resp:   18 16  Temp: 98.5 F (36.9 C) 98.6 F (37 C) 98.6 F (37 C) 98.3 F (36.8 C)  TempSrc: Oral  Oral Oral  SpO2: 100%  100% 100%  Weight:      Height:        Examination:  GENERAL: No apparent distress.  Awake and alert. HEENT: Mild pallor. NECK: Supple. .  Neuro: Awake and alert. RESP: Clear to auscultation. CVS: S1-S2. ABD/GI/GU: Obese, soft and nontender..  MSK/EXT: No leg  edema..  Consultants:  Infectious disease. Cardiology for TEE. Neurosurgery to drain epidural abscess Cardiothoracic surgery for mitral valve endocarditis  Procedures: 5/5-decompressive lumbar laminectomy, medial facetectomy and foraminostomies from T12-L3 with evacuation of epidural abscess by Dr. Rochelle Chu   Microbiology summarized: 4/30-blood cultures NGTD 5/5-blood culture with Streptococcus pneumoniae 5/5-abscess culture with Streptococcus pneumoniae 5/6-repeat blood cultures NGTD  Assessment and plan: Epidural abscess with severe canal stenosis Streptococcus pneumoniae bacteremia Severe lower back due to the above -Presented with lower back pain radiating to right groin for days. -MRI thoracic or lumbar spine as above -CRP and ESR markedly elevated. -S/p surgical decompression by Dr. Rochelle Chu on 10/29/2023 -TTE without significant finding. -TEE revealed mitral valve endocarditis. - Cardiothoracic surgery has advised IV antibiotics for now.  Patient will follow-up with cardiothoracic surgery on discharge.   -Continue to optimize glycemic control. -PT/OT as recommended CIR. - Pursue disposition.  Acute encephalopathy/delirium: - Resolved.    Mitral valve endocarditis: - TEE revealed mitral valve vegetation. -Cardiothoracic surgery team has advised continuing antibiotics.  Patient will follow-up with cardiothoracic team on discharge.    Uncontrolled NIDDM-2 with hyperglycemia: A1c 8.7%.  On metformin  and Jardiance  at home Recent Labs  Lab 11/03/23 1637 11/03/23 2124 11/04/23 0646 11/04/23 1144 11/04/23 1632  GLUCAP 130* 107* 149* 171* 206*  -Continue [name]-resistant scale -Continue Semglee  30 units daily -Continue NovoLog  7 units 3 times daily with meals -Continue Tradjenta  5 Mg p.o. once daily.  Essential hypertension:  -Blood pressure is controlled.  Proteinuria -Continue home lisinopril   Leukocytosis/bandemia: Likely due to the  above.  Improving. - Antibiotics  per ID 11/03/2023: WBC is down to 11. 11/04/2023: Repeat CBC tomorrow.  Hyponatremia: - Mild - Continue to monitor.   - Resolved.  Body mass index is 27.41 kg/m.           DVT prophylaxis:  SCD's Start: 10/29/23 1856 Pharmacologic VTE prophylaxis once okay from surgical standpoint  Code Status: Full code Family Communication: Husband at bedside. Level of care: Med-Surg Status is: Inpatient Remains inpatient appropriate because: Epidural abscess and bacteremia   Final disposition: CIR?  Sch Meds:  Scheduled Meds:  acetaminophen   1,000 mg Oral Q6H WA   amLODipine   10 mg Oral Daily   atorvastatin   10 mg Oral QHS   celecoxib   200 mg Oral Q12H   insulin  aspart  0-20 Units Subcutaneous TID WC   insulin  aspart  0-5 Units Subcutaneous QHS   insulin  aspart  7 Units Subcutaneous TID WC   insulin  glargine-yfgn  30 Units Subcutaneous Daily   linagliptin   5 mg Oral Daily   lisinopril   5 mg Oral Daily   senna-docusate  2 tablet Oral BID   sodium chloride  flush  3 mL Intravenous Q12H   Continuous Infusions:  cefTRIAXone  (ROCEPHIN )  IV 2 g (11/04/23 0548)   PRN Meds:.haloperidol  lactate, HYDROmorphone  (DILAUDID ) injection, labetalol , loratadine , magnesium  citrate, menthol -cetylpyridinium **OR** phenol, methocarbamol  **OR** methocarbamol  (ROBAXIN ) injection, ondansetron  **OR** ondansetron  (ZOFRAN ) IV, mouth rinse, oxyCODONE , [COMPLETED] polyethylene glycol **FOLLOWED BY** polyethylene glycol, sodium chloride  flush  Antimicrobials: Anti-infectives (From admission, onward)    Start     Dose/Rate Route Frequency Ordered Stop   10/31/23 0600  cefTRIAXone  (ROCEPHIN ) 2 g in sodium chloride  0.9 % 100 mL IVPB        2 g 200 mL/hr over 30 Minutes Intravenous Every 24 hours 10/30/23 1012     10/30/23 1000  vancomycin  (VANCOREADY) IVPB 500 mg/100 mL  Status:  Discontinued        500 mg 100 mL/hr over 60 Minutes Intravenous Every 24 hours 10/29/23 1915 10/30/23 1012   10/30/23 1000   cefTRIAXone  (ROCEPHIN ) 2 g in sodium chloride  0.9 % 100 mL IVPB  Status:  Discontinued        2 g 200 mL/hr over 30 Minutes Intravenous Every 12 hours 10/29/23 2202 10/29/23 2203   10/30/23 0600  cefTRIAXone  (ROCEPHIN ) 2 g in sodium chloride  0.9 % 100 mL IVPB  Status:  Discontinued        2 g 200 mL/hr over 30 Minutes Intravenous Every 12 hours 10/30/23 0457 10/30/23 1012   10/29/23 2300  metroNIDAZOLE  (FLAGYL ) IVPB 500 mg  Status:  Discontinued        500 mg 100 mL/hr over 60 Minutes Intravenous Every 12 hours 10/29/23 2202 10/30/23 0457   10/29/23 2300  ceFEPIme  (MAXIPIME ) 2 g in sodium chloride  0.9 % 100 mL IVPB  Status:  Discontinued        2 g 200 mL/hr over 30 Minutes Intravenous Every 12 hours 10/29/23 2209 10/30/23 0457   10/29/23 2200  cefTRIAXone  (ROCEPHIN ) 1 g in sodium chloride  0.9 % 100 mL IVPB  Status:  Discontinued        1 g 200 mL/hr over 30 Minutes Intravenous Every 12 hours 10/29/23 1855 10/29/23 2202   10/29/23 1424  vancomycin  (VANCOCIN ) powder  Status:  Discontinued          As needed 10/29/23 1424 10/29/23 1454   10/29/23 1130  cefTRIAXone  (ROCEPHIN ) 2 g in sodium chloride  0.9 % 100 mL  IVPB        2 g 200 mL/hr over 30 Minutes Intravenous  Once 10/29/23 1115 10/29/23 1255   10/29/23 1130  vancomycin  (VANCOCIN ) IVPB 1000 mg/200 mL premix        1,000 mg 200 mL/hr over 60 Minutes Intravenous  Once 10/29/23 1115 10/29/23 1230   10/29/23 1119  sodium chloride  0.9 % with cefTRIAXone  (ROCEPHIN ) ADS Med       Note to Pharmacy: Franceen Inches: cabinet override      10/29/23 1119 10/29/23 1310   10/29/23 1119  vancomycin  (VANCOCIN ) 1-5 GM/200ML-% IVPB       Note to Pharmacy: Franceen Inches: cabinet override      10/29/23 1119 10/29/23 1241        I have personally reviewed the following labs and images: CBC: Recent Labs  Lab 10/30/23 0621 10/31/23 0534 11/01/23 1053 11/02/23 0609 11/03/23 0550  WBC 17.7* 21.1* 17.1* 13.0* 11.0*  NEUTROABS  --   --   --   --   6.8  HGB 11.3* 10.7* 10.1* 9.3* 9.4*  HCT 34.1* 32.8* 30.9* 29.2* 28.9*  MCV 82.8 83.7 83.7 84.6 84.3  PLT 365 327 344 315 372   BMP &GFR Recent Labs  Lab 10/30/23 0621 10/31/23 0534 11/01/23 1053 11/02/23 0609 11/03/23 0550  NA 134* 139 136 136 136  K 3.7 4.1 3.8 4.2 4.0  CL 95* 100 97* 98 100  CO2 23 28 28 25 29   GLUCOSE 303* 190* 182* 226* 179*  BUN 23 17 15 16 12   CREATININE 0.81 0.79 0.57 0.74 0.60  CALCIUM  9.1 9.0 8.9 8.7* 8.5*  MG 1.9 2.1 2.1 2.0 2.0  PHOS 2.4* 2.5 2.3* 3.7 3.0   Estimated Creatinine Clearance: 79.3 mL/min (by C-G formula based on SCr of 0.6 mg/dL). Liver & Pancreas: Recent Labs  Lab 10/30/23 0621 10/31/23 0534 11/01/23 1053 11/02/23 0609 11/03/23 0550  ALBUMIN 2.4* 2.3* 2.3* 2.0* 2.1*   No results for input(s): "LIPASE", "AMYLASE" in the last 168 hours.  No results for input(s): "AMMONIA" in the last 168 hours. Diabetic: No results for input(s): "HGBA1C" in the last 72 hours.  Recent Labs  Lab 11/03/23 1637 11/03/23 2124 11/04/23 0646 11/04/23 1144 11/04/23 1632  GLUCAP 130* 107* 149* 171* 206*   Cardiac Enzymes: No results for input(s): "CKTOTAL", "CKMB", "CKMBINDEX", "TROPONINI" in the last 168 hours. No results for input(s): "PROBNP" in the last 8760 hours. Coagulation Profile: No results for input(s): "INR", "PROTIME" in the last 168 hours.  Thyroid  Function Tests: No results for input(s): "TSH", "T4TOTAL", "FREET4", "T3FREE", "THYROIDAB" in the last 72 hours. Lipid Profile: No results for input(s): "CHOL", "HDL", "LDLCALC", "TRIG", "CHOLHDL", "LDLDIRECT" in the last 72 hours.  Anemia Panel: No results for input(s): "VITAMINB12", "FOLATE", "FERRITIN", "TIBC", "IRON", "RETICCTPCT" in the last 72 hours. Urine analysis:    Component Value Date/Time   COLORURINE YELLOW 10/26/2023 2027   APPEARANCEUR CLEAR 10/26/2023 2027   LABSPEC 1.031 (H) 10/26/2023 2027   PHURINE 5.0 10/26/2023 2027   GLUCOSEU >=500 (A) 10/26/2023 2027    HGBUR MODERATE (A) 10/26/2023 2027   BILIRUBINUR NEGATIVE 10/26/2023 2027   KETONESUR 20 (A) 10/26/2023 2027   PROTEINUR >=300 (A) 10/26/2023 2027   NITRITE NEGATIVE 10/26/2023 2027   LEUKOCYTESUR NEGATIVE 10/26/2023 2027   Sepsis Labs: Invalid input(s): "PROCALCITONIN", "LACTICIDVEN"  Microbiology: Recent Results (from the past 240 hours)  Surgical pcr screen     Status: None   Collection Time: 10/29/23 10:02 AM  Specimen: Nasal Mucosa; Nasal Swab  Result Value Ref Range Status   MRSA, PCR NEGATIVE NEGATIVE Final   Staphylococcus aureus NEGATIVE NEGATIVE Final    Comment: (NOTE) The Xpert SA Assay (FDA approved for NASAL specimens in patients 61 years of age and older), is one component of a comprehensive surveillance program. It is not intended to diagnose infection nor to guide or monitor treatment. Performed at [facility], [address], [place], [place] [address]   Culture, blood (Routine X 2) w Reflex to ID Panel     Status: None   Collection Time: 10/29/23 10:18 AM   Specimen: BLOOD RIGHT ARM  Result Value Ref Range Status   Specimen Description BLOOD RIGHT ARM  Final   Special Requests   Final    BOTTLES DRAWN AEROBIC AND ANAEROBIC Blood Culture adequate volume   Culture   Final    NO GROWTH 5 DAYS Performed at [facility], [address], [place], [place] [address]    Report Status 11/03/2023 FINAL  Final  Culture, blood (Routine X 2) w Reflex to ID Panel     Status: Abnormal   Collection Time: 10/29/23 10:27 AM   Specimen: BLOOD LEFT ARM  Result Value Ref Range Status   Specimen Description BLOOD LEFT ARM  Final   Special Requests   Final    BOTTLES DRAWN AEROBIC AND ANAEROBIC BACTEROIDES CACCAE   Culture  Setup Time   Final    GRAM POSITIVE COCCI IN PAIRS IN CHAINS ANAEROBIC BOTTLE ONLY CRITICAL RESULT CALLED TO, READ BACK BY AND VERIFIED WITH: PHARMD J LEDFORD 10/30/2023 @ 0440 BY AB Performed at [facility], [address]  [place], [place] [address]    Culture STREPTOCOCCUS PNEUMONIAE (A)  Final   Report Status 11/01/2023 FINAL  Final   Organism ID, Bacteria STREPTOCOCCUS PNEUMONIAE  Final      Susceptibility   Streptococcus pneumoniae - MIC*    ERYTHROMYCIN <=0.12 SENSITIVE Sensitive     LEVOFLOXACIN 1 SENSITIVE Sensitive     VANCOMYCIN  0.5 SENSITIVE Sensitive     PENICILLIN (meningitis) <=0.06 SENSITIVE Sensitive     PENO - penicillin <=0.06      PENICILLIN (non-meningitis) <=0.06 SENSITIVE Sensitive     PENICILLIN (oral) <=0.06 SENSITIVE Sensitive     CEFTRIAXONE  (non-meningitis) <=0.12 SENSITIVE Sensitive     CEFTRIAXONE  (meningitis) <=0.12 SENSITIVE Sensitive     * STREPTOCOCCUS PNEUMONIAE  Blood Culture ID Panel (Reflexed)     Status: Abnormal   Collection Time: 10/29/23 10:27 AM  Result Value Ref Range Status   Enterococcus faecalis NOT DETECTED NOT DETECTED Final   Enterococcus Faecium NOT DETECTED NOT DETECTED Final   Listeria monocytogenes NOT DETECTED NOT DETECTED Final   Staphylococcus species NOT DETECTED NOT DETECTED Final   Staphylococcus aureus (BCID) NOT DETECTED NOT DETECTED Final   Staphylococcus epidermidis NOT DETECTED NOT DETECTED Final   Staphylococcus lugdunensis NOT DETECTED NOT DETECTED Final   Streptococcus species DETECTED (A) NOT DETECTED Final    Comment: CRITICAL RESULT CALLED TO, READ BACK BY AND VERIFIED WITH: PHARMD J LEDFORD 10/30/2023 @ 0440 BY AB    Streptococcus agalactiae NOT DETECTED NOT DETECTED Final   Streptococcus pneumoniae DETECTED (A) NOT DETECTED Final    Comment: CRITICAL RESULT CALLED TO, READ BACK BY AND VERIFIED WITH: PHARMD J LEDFORD 10/30/2023 @ 0440 BY AB    Streptococcus pyogenes NOT DETECTED NOT DETECTED Final   A.calcoaceticus-baumannii NOT DETECTED NOT DETECTED Final  Bacteroides fragilis NOT DETECTED NOT DETECTED Final   Enterobacterales NOT DETECTED NOT DETECTED Final   Enterobacter cloacae complex NOT DETECTED NOT DETECTED Final   Escherichia  coli NOT DETECTED NOT DETECTED Final   Klebsiella aerogenes NOT DETECTED NOT DETECTED Final   Klebsiella oxytoca NOT DETECTED NOT DETECTED Final   Klebsiella pneumoniae NOT DETECTED NOT DETECTED Final   Proteus species NOT DETECTED NOT DETECTED Final   Salmonella species NOT DETECTED NOT DETECTED Final   Serratia marcescens NOT DETECTED NOT DETECTED Final   Haemophilus influenzae NOT DETECTED NOT DETECTED Final   Neisseria meningitidis NOT DETECTED NOT DETECTED Final   Pseudomonas aeruginosa NOT DETECTED NOT DETECTED Final   Stenotrophomonas maltophilia NOT DETECTED NOT DETECTED Final   Candida albicans NOT DETECTED NOT DETECTED Final   Candida auris NOT DETECTED NOT DETECTED Final   Candida glabrata NOT DETECTED NOT DETECTED Final   Candida krusei NOT DETECTED NOT DETECTED Final   Candida parapsilosis NOT DETECTED NOT DETECTED Final   Candida tropicalis NOT DETECTED NOT DETECTED Final   Cryptococcus neoformans/gattii NOT DETECTED NOT DETECTED Final    Comment: Performed at [facility], [address], [place], [place] [address]  Aerobic/Anaerobic Culture w Gram Stain (surgical/deep wound)     Status: None   Collection Time: 10/29/23  1:13 PM   Specimen: PATH Soft tissue  Result Value Ref Range Status   Specimen Description ABSCESS  Final   Special Requests LUMBAR ABSCESS NO 1  Final   Gram Stain   Final    FEW WBC PRESENT, PREDOMINANTLY PMN RARE GRAM POSITIVE COCCI IN PAIRS    Culture   Final    FEW STREPTOCOCCUS PNEUMONIAE SUSCEPTIBILITIES PERFORMED ON PREVIOUS CULTURE WITHIN THE LAST 5 DAYS. NO ANAEROBES ISOLATED Performed at [facility], [address], [place], [place] [address]    Report Status 11/03/2023 FINAL  Final  Aerobic/Anaerobic Culture w Gram Stain (surgical/deep wound)     Status: None   Collection Time: 10/29/23  1:23 PM   Specimen: PATH Soft tissue  Result Value Ref Range Status   Specimen Description ABSCESS  Final   Special Requests LUMBAR  ABSCESS NO 2  Final   Gram Stain   Final    ABUNDANT WBC PRESENT, PREDOMINANTLY PMN FEW GRAM POSITIVE COCCI IN PAIRS    Culture   Final    MODERATE STREPTOCOCCUS PNEUMONIAE NO ANAEROBES ISOLATED Performed at [facility], [address], [place], [place] [address]    Report Status 11/03/2023 FINAL  Final   Organism ID, Bacteria STREPTOCOCCUS PNEUMONIAE  Final      Susceptibility   Streptococcus pneumoniae - MIC*    ERYTHROMYCIN <=0.12 SENSITIVE Sensitive     LEVOFLOXACIN 1 SENSITIVE Sensitive     VANCOMYCIN  0.5 SENSITIVE Sensitive     PENICILLIN (meningitis) <=0.06 SENSITIVE Sensitive     PENO - penicillin <=0.06      PENICILLIN (non-meningitis) <=0.06 SENSITIVE Sensitive     PENICILLIN (oral) <=0.06 SENSITIVE Sensitive     CEFTRIAXONE  (non-meningitis) <=0.12 SENSITIVE Sensitive     CEFTRIAXONE  (meningitis) <=0.12 SENSITIVE Sensitive     * MODERATE STREPTOCOCCUS PNEUMONIAE  Aerobic/Anaerobic Culture w Gram Stain (surgical/deep wound)     Status: None   Collection Time: 10/29/23  1:30 PM   Specimen: PATH Soft tissue  Result Value Ref Range Status   Specimen Description ABSCESS  Final   Special Requests LUMBAR ABSCESS NO 3  Final   Gram Stain   Final  RARE WBC PRESENT, PREDOMINANTLY PMN RARE GRAM POSITIVE COCCI IN PAIRS    Culture   Final    RARE STREPTOCOCCUS PNEUMONIAE SUSCEPTIBILITIES PERFORMED ON PREVIOUS CULTURE WITHIN THE LAST 5 DAYS. NO ANAEROBES ISOLATED Performed at [facility], [address] [place], [place] [address]    Report Status 11/03/2023 FINAL  Final  Culture, blood (Routine X 2) w Reflex to ID Panel     Status: None   Collection Time: 10/30/23  5:48 PM   Specimen: BLOOD LEFT ARM  Result Value Ref Range Status   Specimen Description BLOOD LEFT ARM  Final   Special Requests   Final    BOTTLES DRAWN AEROBIC AND ANAEROBIC Blood Culture adequate volume   Culture   Final    NO GROWTH 5 DAYS Performed at [facility], [address], [place], [place] [address]    Report Status 11/04/2023 FINAL  Final  Culture, blood (Routine X 2) w Reflex to ID Panel     Status: None   Collection Time: 10/30/23  6:07 PM   Specimen: BLOOD LEFT ARM  Result Value Ref Range Status   Specimen Description BLOOD LEFT ARM  Final   Special Requests   Final    AEROBIC BOTTLE ONLY Blood Culture results may not be optimal due to an inadequate volume of blood received in culture bottles   Culture   Final    NO GROWTH 5 DAYS Performed at [facility], [address], [place], [place] [address]    Report Status 11/04/2023 FINAL  Final    Radiology Studies: No results found.   Time spent: 35 minutes.   Fonnie Iba, MD  [facility]  If 7PM-7AM, please contact night-coverage [email or web address] 11/04/2023, 5:36 PM

## 2023-11-05 ENCOUNTER — Inpatient Hospital Stay (HOSPITAL_COMMUNITY): Admitting: Anesthesiology

## 2023-11-05 ENCOUNTER — Encounter (HOSPITAL_COMMUNITY): Payer: Self-pay | Admitting: Student

## 2023-11-05 ENCOUNTER — Encounter (HOSPITAL_COMMUNITY)
Admission: EM | Disposition: A | Discharge status: Rehabilitation Facility | Payer: Self-pay | Source: Home / Self Care | Attending: Student

## 2023-11-05 DIAGNOSIS — S31000A Unspecified open wound of lower back and pelvis without penetration into retroperitoneum, initial encounter: Secondary | ICD-10-CM

## 2023-11-05 DIAGNOSIS — M545 Low back pain, unspecified: Secondary | ICD-10-CM | POA: Diagnosis not present

## 2023-11-05 HISTORY — PX: LUMBAR WOUND DEBRIDEMENT: SHX1988

## 2023-11-05 LAB — CBC WITH DIFFERENTIAL/PLATELET
Abs Immature Granulocytes: 0.09 10*3/uL — ABNORMAL HIGH (ref 0.00–0.07)
Basophils Absolute: 0 10*3/uL (ref 0.0–0.1)
Basophils Relative: 0 %
Eosinophils Absolute: 0.1 10*3/uL (ref 0.0–0.5)
Eosinophils Relative: 1 %
HCT: 30.7 % — ABNORMAL LOW (ref 36.0–46.0)
Hemoglobin: 9.8 g/dL — ABNORMAL LOW (ref 12.0–15.0)
Immature Granulocytes: 1 %
Lymphocytes Relative: 29 %
Lymphs Abs: 3.1 10*3/uL (ref 0.7–4.0)
MCH: 27.4 pg (ref 26.0–34.0)
MCHC: 31.9 g/dL (ref 30.0–36.0)
MCV: 85.8 fL (ref 80.0–100.0)
Monocytes Absolute: 0.6 10*3/uL (ref 0.1–1.0)
Monocytes Relative: 6 %
Neutro Abs: 6.8 10*3/uL (ref 1.7–7.7)
Neutrophils Relative %: 63 %
Platelets: 433 10*3/uL — ABNORMAL HIGH (ref 150–400)
RBC: 3.58 MIL/uL — ABNORMAL LOW (ref 3.87–5.11)
RDW: 14.4 % (ref 11.5–15.5)
WBC: 10.8 10*3/uL — ABNORMAL HIGH (ref 4.0–10.5)
nRBC: 0 % (ref 0.0–0.2)

## 2023-11-05 LAB — GLUCOSE, CAPILLARY
Glucose-Capillary: 144 mg/dL — ABNORMAL HIGH (ref 70–99)
Glucose-Capillary: 163 mg/dL — ABNORMAL HIGH (ref 70–99)
Glucose-Capillary: 156 mg/dL — ABNORMAL HIGH (ref 70–99)
Glucose-Capillary: 117 mg/dL — ABNORMAL HIGH (ref 70–99)
Glucose-Capillary: 135 mg/dL — ABNORMAL HIGH (ref 70–99)
Glucose-Capillary: 263 mg/dL — ABNORMAL HIGH (ref 70–99)

## 2023-11-05 LAB — RENAL FUNCTION PANEL
Albumin: 2.4 g/dL — ABNORMAL LOW (ref 3.5–5.0)
Anion gap: 10 (ref 5–15)
BUN: 17 mg/dL (ref 8–23)
CO2: 29 mmol/L (ref 22–32)
Calcium: 9.2 mg/dL (ref 8.9–10.3)
Chloride: 99 mmol/L (ref 98–111)
Creatinine, Ser: 0.7 mg/dL (ref 0.44–1.00)
GFR, Estimated: >60 mL/min (ref >60)
Glucose, Bld: 150 mg/dL — ABNORMAL HIGH (ref 70–99)
Phosphorus: 4 mg/dL (ref 2.5–4.6)
Potassium: 4.4 mmol/L (ref 3.5–5.1)
Sodium: 138 mmol/L (ref 135–145)

## 2023-11-05 LAB — MAGNESIUM: Magnesium: 2.2 mg/dL (ref 1.7–2.4)

## 2023-11-05 SURGERY — LUMBAR WOUND DEBRIDEMENT
Anesthesia: General | Site: Back

## 2023-11-05 MED ORDER — ROCURONIUM BROMIDE 10 MG/ML (PF) SYRINGE
PREFILLED_SYRINGE | INTRAVENOUS | Status: DC | PRN
  Administered 2023-11-05: 40 mg via INTRAVENOUS

## 2023-11-05 MED ORDER — FENTANYL CITRATE (PF) 100 MCG/2ML IJ SOLN
INTRAMUSCULAR | Status: AC
  Filled 2023-11-05: qty 2

## 2023-11-05 MED ORDER — SODIUM CHLORIDE 0.9% FLUSH
3.0000 mL | Freq: Two times a day (BID) | INTRAVENOUS | Status: DC
  Administered 2023-11-05: 5 mL via INTRAVENOUS
  Administered 2023-11-06: 10 mL via INTRAVENOUS

## 2023-11-05 MED ORDER — SODIUM CHLORIDE 0.9% FLUSH
3.0000 mL | Freq: Two times a day (BID) | INTRAVENOUS | Status: DC
  Administered 2023-11-05 – 2023-11-06 (×2): 3 mL via INTRAVENOUS

## 2023-11-05 MED ORDER — CHLORHEXIDINE GLUCONATE 0.12 % MT SOLN
OROMUCOSAL | Status: AC
Start: 2023-11-05 — End: 2023-11-05
  Administered 2023-11-05: 15 mL via OROMUCOSAL
  Filled 2023-11-05: qty 15

## 2023-11-05 MED ORDER — INSULIN ASPART 100 UNIT/ML IJ SOLN
0.0000 [IU] | INTRAMUSCULAR | Status: DC | PRN
  Administered 2023-11-05: 2 [IU] via SUBCUTANEOUS

## 2023-11-05 MED ORDER — 0.9 % SODIUM CHLORIDE (POUR BTL) OPTIME
TOPICAL | Status: DC | PRN
  Administered 2023-11-05: 1000 mL

## 2023-11-05 MED ORDER — LIDOCAINE 2% (20 MG/ML) 5 ML SYRINGE
INTRAMUSCULAR | Status: DC | PRN
  Administered 2023-11-05: 60 mg via INTRAVENOUS

## 2023-11-05 MED ORDER — FENTANYL CITRATE (PF) 250 MCG/5ML IJ SOLN
INTRAMUSCULAR | Status: AC
  Filled 2023-11-05: qty 5

## 2023-11-05 MED ORDER — PHENYLEPHRINE 80 MCG/ML (10ML) SYRINGE FOR IV PUSH (FOR BLOOD PRESSURE SUPPORT)
PREFILLED_SYRINGE | INTRAVENOUS | Status: AC
  Filled 2023-11-05: qty 10

## 2023-11-05 MED ORDER — OXYCODONE HCL 5 MG/5ML PO SOLN
ORAL | Status: AC
  Filled 2023-11-05: qty 5

## 2023-11-05 MED ORDER — SODIUM CHLORIDE 0.9% FLUSH: 3.0000 mL | INTRAVENOUS | Status: DC | PRN

## 2023-11-05 MED ORDER — OXYCODONE HCL 5 MG PO TABS: 5.0000 mg | ORAL_TABLET | Freq: Once | ORAL | Status: AC | PRN

## 2023-11-05 MED ORDER — PROPOFOL 10 MG/ML IV BOLUS
INTRAVENOUS | Status: DC | PRN
  Administered 2023-11-05: 150 mg via INTRAVENOUS

## 2023-11-05 MED ORDER — MIDAZOLAM HCL 2 MG/2ML IJ SOLN
INTRAMUSCULAR | Status: AC
Start: 2023-11-05 — End: ?
  Filled 2023-11-05: qty 2

## 2023-11-05 MED ORDER — OXYCODONE HCL 5 MG/5ML PO SOLN
5.0000 mg | Freq: Once | ORAL | Status: AC | PRN
  Administered 2023-11-05: 5 mg via ORAL

## 2023-11-05 MED ORDER — CEFAZOLIN SODIUM 1 G IJ SOLR
INTRAMUSCULAR | Status: AC
  Filled 2023-11-05: qty 50

## 2023-11-05 MED ORDER — ONDANSETRON HCL 4 MG/2ML IJ SOLN
INTRAMUSCULAR | Status: AC
  Filled 2023-11-05: qty 2

## 2023-11-05 MED ORDER — PROPOFOL 10 MG/ML IV BOLUS
INTRAVENOUS | Status: AC
  Filled 2023-11-05: qty 20

## 2023-11-05 MED ORDER — CHLORHEXIDINE GLUCONATE 0.12 % MT SOLN: 15.0000 mL | Freq: Once | OROMUCOSAL | Status: AC

## 2023-11-05 MED ORDER — FENTANYL CITRATE (PF) 250 MCG/5ML IJ SOLN
INTRAMUSCULAR | Status: DC | PRN
  Administered 2023-11-05: 50 ug via INTRAVENOUS

## 2023-11-05 MED ORDER — ORAL CARE MOUTH RINSE: 15.0000 mL | Freq: Once | OROMUCOSAL | Status: AC

## 2023-11-05 MED ORDER — CEFAZOLIN SODIUM-DEXTROSE 1-4 GM/50ML-% IV SOLN: 1.0000 g | Freq: Three times a day (TID) | INTRAVENOUS | Status: DC

## 2023-11-05 MED ORDER — CEFAZOLIN SODIUM-DEXTROSE 2-3 GM-%(50ML) IV SOLR
INTRAVENOUS | Status: DC | PRN
  Administered 2023-11-05: 2 g via INTRAVENOUS

## 2023-11-05 MED ORDER — LACTATED RINGERS IV SOLN: INTRAVENOUS | Status: DC

## 2023-11-05 MED ORDER — SODIUM CHLORIDE 0.9 % IV SOLN
250.0000 mL | INTRAVENOUS | Status: AC
  Administered 2023-11-05: 250 mL via INTRAVENOUS

## 2023-11-05 MED ORDER — ROCURONIUM BROMIDE 10 MG/ML (PF) SYRINGE
PREFILLED_SYRINGE | INTRAVENOUS | Status: AC
  Filled 2023-11-05: qty 10

## 2023-11-05 MED ORDER — ONDANSETRON HCL 4 MG/2ML IJ SOLN: 4.0000 mg | Freq: Four times a day (QID) | INTRAMUSCULAR | Status: DC | PRN

## 2023-11-05 MED ORDER — PHENYLEPHRINE 80 MCG/ML (10ML) SYRINGE FOR IV PUSH (FOR BLOOD PRESSURE SUPPORT)
PREFILLED_SYRINGE | INTRAVENOUS | Status: DC | PRN
  Administered 2023-11-05 (×2): 160 ug via INTRAVENOUS

## 2023-11-05 MED ORDER — SUGAMMADEX SODIUM 200 MG/2ML IV SOLN
INTRAVENOUS | Status: DC | PRN
  Administered 2023-11-05: 200 mg via INTRAVENOUS

## 2023-11-05 MED ORDER — MIDAZOLAM HCL 2 MG/2ML IJ SOLN
INTRAMUSCULAR | Status: DC | PRN
  Administered 2023-11-05: 2 mg via INTRAVENOUS

## 2023-11-05 MED ORDER — FENTANYL CITRATE (PF) 100 MCG/2ML IJ SOLN
25.0000 ug | INTRAMUSCULAR | Status: DC | PRN
  Administered 2023-11-05: 50 ug via INTRAVENOUS
  Administered 2023-11-05: 25 ug via INTRAVENOUS
  Administered 2023-11-05: 50 ug via INTRAVENOUS

## 2023-11-05 SURGICAL SUPPLY — 42 items
BAG COUNTER SPONGE SURGICOUNT (BAG) ×1 IMPLANT
BENZOIN TINCTURE PRP APPL 2/3 (GAUZE/BANDAGES/DRESSINGS) ×1 IMPLANT
CANISTER SUCTION 3000ML PPV (SUCTIONS) ×1 IMPLANT
CLEANSER WND VASHE 34 (WOUND CARE) IMPLANT
DRAPE LAPAROTOMY 100X72X124 (DRAPES) ×1 IMPLANT
DURAPREP 26ML APPLICATOR (WOUND CARE) IMPLANT
DURAPREP 6ML APPLICATOR 50/CS (WOUND CARE) IMPLANT
ELECTRODE REM PT RTRN 9FT ADLT (ELECTROSURGICAL) ×1 IMPLANT
EVACUATOR 1/8 PVC DRAIN (DRAIN) IMPLANT
GAUZE 4X4 16PLY ~~LOC~~+RFID DBL (SPONGE) IMPLANT
GLOVE BIO SURGEON STRL SZ7 (GLOVE) IMPLANT
GLOVE BIO SURGEON STRL SZ8 (GLOVE) ×1 IMPLANT
GLOVE BIOGEL PI IND STRL 7.0 (GLOVE) IMPLANT
GOWN STRL REUS W/ TWL LRG LVL3 (GOWN DISPOSABLE) IMPLANT
GOWN STRL REUS W/ TWL XL LVL3 (GOWN DISPOSABLE) IMPLANT
GOWN STRL REUS W/TWL 2XL LVL3 (GOWN DISPOSABLE) ×1 IMPLANT
GRAFT MYRIAD 3 LAYER 5X5 (Graft) IMPLANT
KIT BASIN OR (CUSTOM PROCEDURE TRAY) ×1 IMPLANT
KIT TURNOVER KIT B (KITS) ×1 IMPLANT
NDL HYPO 18GX1.5 BLUNT FILL (NEEDLE) IMPLANT
NDL HYPO 25X1 1.5 SAFETY (NEEDLE) ×1 IMPLANT
NDL SPNL 20GX3.5 QUINCKE YW (NEEDLE) IMPLANT
NEEDLE HYPO 18GX1.5 BLUNT FILL (NEEDLE) IMPLANT
NEEDLE HYPO 25X1 1.5 SAFETY (NEEDLE) ×1 IMPLANT
NEEDLE SPNL 20GX3.5 QUINCKE YW (NEEDLE) IMPLANT
NS IRRIG 1000ML POUR BTL (IV SOLUTION) ×1 IMPLANT
PACK LAMINECTOMY NEURO (CUSTOM PROCEDURE TRAY) ×1 IMPLANT
PAD ARMBOARD POSITIONER FOAM (MISCELLANEOUS) ×3 IMPLANT
PATTIES SURGICAL .5 X.5 (GAUZE/BANDAGES/DRESSINGS) ×1 IMPLANT
PATTIES SURGICAL 1X1 (DISPOSABLE) ×1 IMPLANT
POWDER MYRIAD MORCLLS FINE 500 (Miscellaneous) IMPLANT
STRIP CLOSURE SKIN 1/2X4 (GAUZE/BANDAGES/DRESSINGS) ×1 IMPLANT
SUT VIC AB 0 CT1 18XCR BRD8 (SUTURE) ×1 IMPLANT
SUT VIC AB 2-0 CP2 18 (SUTURE) ×1 IMPLANT
SUT VIC AB 3-0 SH 8-18 (SUTURE) ×1 IMPLANT
SWAB COLLECTION DEVICE MRSA (MISCELLANEOUS) ×1 IMPLANT
SWAB CULTURE ESWAB REG 1ML (MISCELLANEOUS) ×1 IMPLANT
SYR 30ML SLIP (SYRINGE) ×1 IMPLANT
SYR 3ML LL SCALE MARK (SYRINGE) IMPLANT
TOWEL GREEN STERILE (TOWEL DISPOSABLE) ×1 IMPLANT
TOWEL GREEN STERILE FF (TOWEL DISPOSABLE) ×1 IMPLANT
WATER STERILE IRR 1000ML POUR (IV SOLUTION) ×1 IMPLANT

## 2023-11-05 NOTE — Progress Notes (Signed)
 Occupational Therapy Treatment Patient Details Name: Kristina Long MRN: [id number] DOB: 1956/06/06 Today's Date: 11/05/2023   History of present illness 68 y.o. female admitted 10/26/23 with progressively worsening low back and R flank pain (seen in ED 4/28 and 4/30, eventually sent home); complicated by AMS, wandering in ED hallway, suspect related to pain meds. Lumbar CT showed Grade 1 L4-5 anterolisthesis. Pt unable to tolerate MRI. S/P decompressive lumbar laminectomy T12-L3 5/5.PMH includes DM.   OT comments  Pt in recliner upon entry. Completed medi cog assessment, scoring 8/10. Noted difficulty with clock drawing requiring increased time but able to complete; difficulty attending to task and alternating between directions and written pill box.  Also noted difficulty with dual cognitive task in hallway, forgetting where to turn around while talking. Pt mobilizing well, plans to return to surgery today. Will follow acutely.       If plan is discharge home, recommend the following:  A little help with walking and/or transfers;A little help with bathing/dressing/bathroom;Direct supervision/assist for medications management;Supervision due to cognitive status   Equipment Recommendations  Other (comment) (defer)    Recommendations for Other Services Rehab consult    Precautions / Restrictions Precautions Precautions: Back;Fall Recall of Precautions/Restrictions: Intact Precaution/Restrictions Comments: able to recall BLT, intermittent cueing for adherance Restrictions Weight Bearing Restrictions Per Provider Order: No       Mobility Bed Mobility               General bed mobility comments: OOB in recliner upon entry    Transfers Overall transfer level: Needs assistance Equipment used: None Transfers: Sit to/from Stand Sit to Stand: Contact guard assist           General transfer comment: pushing from recliner, min guard for safety/balance.     Balance Overall  balance assessment: Needs assistance Sitting-balance support: No upper extremity supported, Feet supported Sitting balance-Leahy Scale: Good     Standing balance support: No upper extremity supported, During functional activity Standing balance-Leahy Scale: Fair Standing balance comment: min guard for safety, no AD                           ADL either performed or assessed with clinical judgement   ADL Overall ADL's : Needs assistance/impaired                         Toilet Transfer: Contact guard Marine scientist Details (indicate cue type and reason): no AD         Functional mobility during ADLs: Contact guard assist General ADL Comments: focus on cogntion today, mobilizing well    Extremity/Trunk Assessment              Vision       Perception     Praxis     Communication Communication Communication: No apparent difficulties   Cognition Arousal: Alert Behavior During Therapy: WFL for tasks assessed/performed Cognition: Cognition impaired     Awareness: Intellectual awareness intact, Online awareness impaired Memory impairment (select all impairments): Short-term memory, Working memory Attention impairment (select first level of impairment): Selective attention Executive functioning impairment (select all impairments): Problem solving, Organization, Sequencing OT - Cognition Comments: cognition improving, medicog scoring 8/10 with decreased attentino, problem sovling and organziation.  difficulty with dual cog tasking.                 Following commands: Impaired Following commands impaired: Follows one step commands with  increased time, Follows multi-step commands inconsistently      Cueing   Cueing Techniques: Verbal cues, Tactile cues  Exercises      Shoulder Instructions       General Comments pt educated on recommendatiosn to use pill box, have spouse assist her. pt and spouse agreeable     Pertinent Vitals/ Pain       Pain Assessment Pain Assessment: Faces Faces Pain Scale: Hurts a little bit Pain Location: back Pain Descriptors / Indicators: Discomfort Pain Intervention(s): Limited activity within patient's tolerance, Monitored during session, Repositioned  Home Living                                          Prior Functioning/Environment              Frequency  Min 2X/week        Progress Toward Goals  OT Goals(current goals can now be found in the care plan section)  Progress towards OT goals: Progressing toward goals  Acute Rehab OT Goals Patient Stated Goal: to get better OT Goal Formulation: With patient Time For Goal Achievement: 11/13/23 Potential to Achieve Goals: Good  Plan      Co-evaluation                 AM-PAC OT "6 Clicks" Daily Activity     Outcome Measure   Help from another person eating meals?: Total (NPO today for surgery) Help from another person taking care of personal grooming?: A Little Help from another person toileting, which includes using toliet, bedpan, or urinal?: A Little Help from another person bathing (including washing, rinsing, drying)?: A Lot Help from another person to put on and taking off regular upper body clothing?: A Little Help from another person to put on and taking off regular lower body clothing?: A Lot 6 Click Score: 14    End of Session    OT Visit Diagnosis: Unsteadiness on feet (R26.81);Other abnormalities of gait and mobility (R26.89)   Activity Tolerance Patient tolerated treatment well   Patient Left in chair;with call bell/phone within reach;with family/visitor present   Nurse Communication Mobility status        Time: 1610-9604 OT Time Calculation (min): 33 min  Charges: OT General Charges $OT Visit: 1 Visit OT Treatments $Self Care/Home Management : 23-37 mins  Bary Boss, OT Acute Rehabilitation Services Office [phone] Secure Chat  Preferred    Fredrich Jefferson 11/05/2023, 12:54 PM

## 2023-11-05 NOTE — Progress Notes (Signed)
 PROGRESS NOTE Kristina Long  ZOX:[id number] DOB: Oct 25, 1955 DOA: 10/26/2023 PCP: Arlys Berke, MD  Brief Narrative/Hospital Course: 68 year old F with PMH of DM-2 returning to ED for the third time in a week with right back and flank pain and found to have epidural abscess and Streptococcus bacteremia. [name]  She said she had extensive evaluation by different experts outpatient at that time without significant finding.  Eventually pain resolved on its own.  [name], and started feeling similar pain.  [name]  At that time, WBC 19.7 with left shift.  CMP, lipase, UA and CT abdomen and pelvis without significant finding.  Symptoms improved with pain medication and [name]  [name]  This time, she was febrile to 100.5.  WBC 18.4 with left shift.  CT renal stone study with grade 1 L4-L5 anterolisthesis.  Blood cultures obtained.  [name] -Patient returns to ED on 5/2 with right low back pain radiating to right groin and altered mental status.  She accidentally took 8 of the 200 mg ibuprofen, 2 oxycodone  and her muscle relaxers due to pain.   In ED, slightly tachycardic, tachypneic and hypertensive.  WBC 16.5 with left shift.  CMP with hyperglycemia.  UA with moderate Hgb, proteinuria and glucosuria.  Reportedly, she was in severe pain while in ED. MRI was ordered but delayed due to altered mental status.  She was admitted and transferred to [facility] for MRI under anesthesia.  MRI thoracic/lumbar spine concerning for epidural abscess extending from T11-L5 with resultant severe canal stenosis at L1-L2 and L2-L3 and moderate canal stenosis at L3-L4.   -s/ p  decompressive lumbar laminectomy, [name]  Blood and abscess culture with Streptococcus pneumonia. Antibiotic de-escalated to ceftriaxone  per ID.  Repeat blood cultures 10/30/23  NGTD. TEE 5/9>revealed mitral valve endocarditis. Cardiothoracic surgery team has been consulted and advise continued IV antibiotics and follow-up outpatient- they advised "IV antibiotics and monitoring. If vegetation progresses or she develops MR would need surgery"  Plan for CIR.   Consultation: Neurosurgery ID CT surgery  Procedures: 5/5-decompressive lumbar laminectomy, medial facetectomy and foraminostomies from T12-L3 with evacuation of epidural abscess by Dr. Rochelle Chu  5/9 TEE  Subjective: Seen examinEd in [facility] Patient  went to OR today for washout Overnight afebrile BP stable on room air Labs reviewed stable electrolytes, CBC downtrending WBC hemoglobin 9.8 g stable Patient has been draining serosanguineous fluid given after reinforcing her wound with staples on last Friday   Assessment and plan:  Epidural abscess with severe canal stenosis Streptococcus pneumoniae bacteremia Severe lower back due to the above Presented with lower back pain radiating to right groin for days AND MRI thoracic or lumbar spine finding with epidural abscess and severe canal stenosis, S/P decompression by Dr. Rochelle Chu on 10/29/2023 TTE OK,TEE revealed mitral valve endocarditis. Cardiothoracic surgery evaluated- they advised "IV antibiotics and monitoring. If vegetation progresses or she develops MR would need surgery" Patient has been draining serosanguineous fluid given after reinforcing her wound with staples on last Friday Neurosurgery evaluated and S/P washout 5/12 Cont PT/OT CIR  Acute encephalopathy/delirium: Resolved.  Delirium precaution   Mitral valve endocarditis: Continue IV antibiotics see discussion above     Uncontrolled NIDDM-2 with hyperglycemia:  A1c 8.7%. PTA on metformin  and Jardiance  -will hold metformin  cont Jardiance ,ssi, [name], Semglee  30 units-being held due to n.p.o. Recent Labs  Lab 11/04/23 0646 11/04/23 1144 11/04/23 1632 11/04/23 2145 11/05/23 0635  GLUCAP 149* 171* 206* 118* 144*    Essential hypertension:  Blood pressure is controlled.  On lisinopril  and amlodipine .  HLD: Continue statin.   Proteinuria Continue home lisinopril    Hyponatremia: Resolved.  Chronic normocytic anemia: hemoglobin holding around 9 to 10 g.  Monitor   DVT prophylaxis: SCD's Start: 10/29/23 1856 Code Status:   Code Status: Full Code Family Communication: none Patient status is: Remains hospitalized because of severity of illness Level of care: Med-Surg   Dispo: The patient is from: home            Anticipated disposition: TBD Objective: Vitals last 24 hrs: Vitals:   11/04/23 1951 11/05/23 0503 11/05/23 0728 11/05/23 1137  BP: 124/72 127/72 130/75 129/74  Pulse: 95 80 80 88  Resp: 16 16 16 18   Temp: 97.8 F (36.6 C) 98.3 F (36.8 C) 98.3 F (36.8 C) 98 F (36.7 C)  TempSrc: Oral Oral  Oral  SpO2: 100% 98% 99% 99%  Weight:    86.6 kg  Height:    5\' 10"  (1.778 m)    Physical Examination: General exam: LETHARGIC,WAKING UP HEENT:Oral mucosa moist, Ear/Nose WNL grossly Respiratory system: Bilaterally clear BS Cardiovascular system: S1 & S2 +, No JVD. Gastrointestinal system: Abdomen soft,NT,ND, BS+ Nervous System: waking up - follows commands Extremities: LE edema neg,distal peripheral pulses palpable and warm.  Skin: No rashes,no icterus. Drain from back+ MSK: Normal muscle bulk,tone, power   Data Reviewed: I have personally reviewed following labs and imaging studies ( see epic result tab) CBC: Recent Labs  Lab 10/31/23 0534 11/01/23 1053 11/02/23 0609 11/03/23 0550 11/05/23 0650  WBC 21.1* 17.1* 13.0* 11.0* 10.8*  NEUTROABS  --   --   --   6.8 6.8  HGB 10.7* 10.1* 9.3* 9.4* 9.8*  HCT 32.8* 30.9* 29.2* 28.9* 30.7*  MCV 83.7 83.7 84.6 84.3 85.8  PLT 327 344 315 372 433*   CMP: Recent Labs  Lab 10/31/23 0534 11/01/23 1053 11/02/23 0609 11/03/23 0550 11/05/23 0650  NA 139 136 136 136 138  K 4.1 3.8 4.2 4.0 4.4  CL 100 97* 98 100 99  CO2 28 28 25 29 29   GLUCOSE 190* 182* 226* 179* 150*  BUN 17 15 16 12 17   CREATININE 0.79 0.57 0.74 0.60 0.70  CALCIUM  9.0 8.9 8.7* 8.5* 9.2  MG 2.1 2.1 2.0 2.0 2.2  PHOS 2.5 2.3* 3.7 3.0 4.0   GFR: Estimated Creatinine Clearance: 79.3 mL/min (by C-G formula based on SCr of 0.7 mg/dL). Recent Labs  Lab 10/31/23 0534 11/01/23 1053 11/02/23 0609 11/03/23 0550 11/05/23 0650  ALBUMIN 2.3* 2.3* 2.0* 2.1* 2.4*   No results for input(s): "LIPASE", "AMYLASE" in the last 168 hours. No results for input(s): "AMMONIA" in the last 168 hours. Coagulation Profile: No results for input(s): "INR", "PROTIME" in the last 168 hours. Unresulted Labs (From admission, onward)     Start     Ordered   11/05/23 0500  CBC with Differential/Platelet  Tomorrow morning,   R        11/04/23 1519  Antimicrobials/Microbiology: Anti-infectives (From admission, onward)    Start     Dose/Rate Route Frequency Ordered Stop   10/31/23 0600  [MAR Hold]  cefTRIAXone  (ROCEPHIN ) 2 g in sodium chloride  0.9 % 100 mL IVPB        (MAR Hold since Mon 11/05/2023 at 1135.Hold Reason: Transfer to a Procedural area)   2 g 200 mL/hr over 30 Minutes Intravenous Every 24 hours 10/30/23 1012     10/30/23 1000  vancomycin  (VANCOREADY) IVPB 500 mg/100 mL  Status:  Discontinued        500 mg 100 mL/hr over 60 Minutes Intravenous Every 24 hours 10/29/23 1915 10/30/23 1012   10/30/23 1000  cefTRIAXone  (ROCEPHIN ) 2 g in sodium chloride  0.9 % 100 mL IVPB  Status:  Discontinued        2 g 200 mL/hr over 30 Minutes Intravenous Every 12 hours 10/29/23 2202 10/29/23 2203   10/30/23 0600  cefTRIAXone  (ROCEPHIN ) 2 g in sodium  chloride 0.9 % 100 mL IVPB  Status:  Discontinued        2 g 200 mL/hr over 30 Minutes Intravenous Every 12 hours 10/30/23 0457 10/30/23 1012   10/29/23 2300  metroNIDAZOLE  (FLAGYL ) IVPB 500 mg  Status:  Discontinued        500 mg 100 mL/hr over 60 Minutes Intravenous Every 12 hours 10/29/23 2202 10/30/23 0457   10/29/23 2300  ceFEPIme  (MAXIPIME ) 2 g in sodium chloride  0.9 % 100 mL IVPB  Status:  Discontinued        2 g 200 mL/hr over 30 Minutes Intravenous Every 12 hours 10/29/23 2209 10/30/23 0457   10/29/23 2200  cefTRIAXone  (ROCEPHIN ) 1 g in sodium chloride  0.9 % 100 mL IVPB  Status:  Discontinued        1 g 200 mL/hr over 30 Minutes Intravenous Every 12 hours 10/29/23 1855 10/29/23 2202   10/29/23 1424  vancomycin  (VANCOCIN ) powder  Status:  Discontinued          As needed 10/29/23 1424 10/29/23 1454   10/29/23 1130  cefTRIAXone  (ROCEPHIN ) 2 g in sodium chloride  0.9 % 100 mL IVPB        2 g 200 mL/hr over 30 Minutes Intravenous  Once 10/29/23 1115 10/29/23 1255   10/29/23 1130  vancomycin  (VANCOCIN ) IVPB 1000 mg/200 mL premix        1,000 mg 200 mL/hr over 60 Minutes Intravenous  Once 10/29/23 1115 10/29/23 1230   10/29/23 1119  sodium chloride  0.9 % with cefTRIAXone  (ROCEPHIN ) ADS Med       Note to Pharmacy: Franceen Inches: cabinet override      10/29/23 1119 10/29/23 1310   10/29/23 1119  vancomycin  (VANCOCIN ) 1-5 GM/200ML-% IVPB       Note to Pharmacy: Franceen Inches: cabinet override      10/29/23 1119 10/29/23 1241         Component Value Date/Time   SDES BLOOD LEFT ARM 10/30/2023 1807   SPECREQUEST  10/30/2023 1807    AEROBIC BOTTLE ONLY Blood Culture results may not be optimal due to an inadequate volume of blood received in culture bottles   CULT  10/30/2023 1807    NO GROWTH 5 DAYS Performed at [facility], [address], [place], [place] [address]    REPTSTATUS 11/04/2023 FINAL 10/30/2023 1807    Procedures: Procedure(s) (LRB): LUMBAR WOUND DEBRIDEMENT  (N/A) Medications reviewed:  Scheduled Meds:  [MAR Hold] acetaminophen   1,000 mg Oral Q6H WA   [  MAR Hold] amLODipine   10 mg Oral Daily   [MAR Hold] atorvastatin   10 mg Oral QHS   [MAR Hold] celecoxib   200 mg Oral Q12H   [MAR Hold] insulin  aspart  0-20 Units Subcutaneous TID WC   [MAR Hold] insulin  aspart  0-5 Units Subcutaneous QHS   [MAR Hold] insulin  aspart  7 Units Subcutaneous TID WC   [MAR Hold] insulin  glargine-yfgn  30 Units Subcutaneous Daily   [MAR Hold] linagliptin   5 mg Oral Daily   [MAR Hold] lisinopril   5 mg Oral Daily   [MAR Hold] senna-docusate  2 tablet Oral BID   [MAR Hold] sodium chloride  flush  3 mL Intravenous Q12H   Continuous Infusions:  [MAR Hold] cefTRIAXone  (ROCEPHIN )  IV 2 g (11/05/23 0506)   lactated ringers  10 mL/hr at 11/05/23 1405    Lesa Rape, MD [facility] 11/05/2023, 3:25 PM

## 2023-11-05 NOTE — Progress Notes (Signed)
 PT Cancellation Note  Patient Details Name: Pamilla Bough MRN: [id number] DOB: 03-21-1956   Cancelled Treatment:    Reason Eval/Treat Not Completed: Patient at procedure or test/unavailable - in OR for lumbar washout, PT to check back as able.   Dequavion Follette S, PT DPT Acute Rehabilitation Services Secure Chat Preferred  Office [phone]    Ethridge Herder 11/05/2023, 11:39 AM

## 2023-11-05 NOTE — Progress Notes (Signed)
 Inpatient Rehab Admissions Coordinator:    I received notification that Pt. Won her appeal with payor for CIR admit. However, I do not have  a bed today. I will follow for potential admit this week pending bed availability.   Wandalee Gust, MS, CCC-SLP Rehab Admissions Coordinator  [phone] (celll) [phone] (office)

## 2023-11-05 NOTE — Anesthesia Procedure Notes (Signed)
 Procedure Name: Intubation Date/Time: 11/05/2023 2:13 PM  Performed by: Artemisa Bile D, CRNAPre-anesthesia Checklist: Patient identified, Emergency Drugs available, Suction available and Patient being monitored Patient Re-evaluated:Patient Re-evaluated prior to induction Oxygen  Delivery Method: Circle System Utilized Preoxygenation: Pre-oxygenation with 100% oxygen  Induction Type: IV induction Ventilation: Mask ventilation without difficulty Laryngoscope Size: Mac and 3 Grade View: Grade II Tube type: Oral Tube size: 7.0 mm Number of attempts: 1 Airway Equipment and Method: Stylet and Oral airway Placement Confirmation: ETT inserted through vocal cords under direct vision, positive ETCO2 and breath sounds checked- equal and bilateral Secured at: 22 cm Tube secured with: Tape Dental Injury: Teeth and Oropharynx as per pre-operative assessment

## 2023-11-05 NOTE — Progress Notes (Signed)
 New order received post I &D. Pt currently on caseload and seen earlier today. We will continue to follow patient while on acute care.   Merryl Abraham OT Acute Rehabilitation Services Office [phone]

## 2023-11-05 NOTE — Plan of Care (Signed)
  Problem: Metabolic: Goal: Ability to maintain appropriate glucose levels will improve Outcome: Progressing   Problem: Nutritional: Goal: Maintenance of adequate nutrition will improve Outcome: Progressing   Problem: Skin Integrity: Goal: Risk for impaired skin integrity will decrease Outcome: Progressing   Problem: Pain Managment: Goal: General experience of comfort will improve and/or be controlled Outcome: Progressing

## 2023-11-05 NOTE — Anesthesia Preprocedure Evaluation (Signed)
 Anesthesia Evaluation  Patient identified by MRN, date of birth, ID band Patient awake    Reviewed: Allergy & Precautions, H&P , NPO status , Patient's Chart, lab work & pertinent test results  Airway Mallampati: II   Neck ROM: full    Dental   Pulmonary neg pulmonary ROS   breath sounds clear to auscultation       Cardiovascular negative cardio ROS  Rhythm:regular Rate:Normal     Neuro/Psych 10/29/23: s/p lumbar laminectomy for epidural abscess.    GI/Hepatic   Endo/Other  diabetes, Type 2    Renal/GU      Musculoskeletal   Abdominal   Peds  Hematology   Anesthesia Other Findings   Reproductive/Obstetrics                             Anesthesia Physical Anesthesia Plan  ASA: 2  Anesthesia Plan: General   Post-op Pain Management:    Induction: Intravenous  PONV Risk Score and Plan: 3 and Ondansetron , Dexamethasone  and Treatment may vary due to age or medical condition  Airway Management Planned: Oral ETT  Additional Equipment:   Intra-op Plan:   Post-operative Plan: Extubation in OR  Informed Consent: I have reviewed the patients History and Physical, chart, labs and discussed the procedure including the risks, benefits and alternatives for the proposed anesthesia with the patient or authorized representative who has indicated his/her understanding and acceptance.     Dental advisory given  Plan Discussed with: CRNA, Anesthesiologist and Surgeon  Anesthesia Plan Comments:        Anesthesia Quick Evaluation

## 2023-11-05 NOTE — Transfer of Care (Signed)
 Immediate Anesthesia Transfer of Care Note  Patient: Kristina Long  Procedure(s) Performed: LUMBAR WOUND DEBRIDEMENT (Back)  Patient Location: PACU  Anesthesia Type:General  Level of Consciousness: drowsy  Airway & Oxygen  Therapy: Patient Spontanous Breathing and Patient connected to face mask oxygen   Post-op Assessment: Report given to RN and Post -op Vital signs reviewed and stable  Post vital signs: Reviewed and stable  Last Vitals:  Vitals Value Taken Time  BP 152/90 11/05/23 1518  Temp    Pulse 92 11/05/23 1519  Resp 27 11/05/23 1519  SpO2 100 % 11/05/23 1519  Vitals shown include unfiled device data.  Last Pain:  Vitals:   11/05/23 1148  TempSrc:   PainSc: 0-No pain      Patients Stated Pain Goal: 0 (11/01/23 1240)  Complications: No notable events documented.

## 2023-11-05 NOTE — Op Note (Signed)
 10/26/2023 - 11/05/2023  3:05 PM  PATIENT:  Kristina Long  68 y.o. female  PRE-OPERATIVE DIAGNOSIS: Wound drainage after surgery  POST-OPERATIVE DIAGNOSIS:  same, soft tissue necrosis  PROCEDURE: Lumbar reexploration with blunt and sharp debridement of the soft tissues followed by irrigation of the tissues and primary closure  SURGEON:  Waymond Hailey, MD  ASSISTANTS: Jackee Marus, FNP  ANESTHESIA:   General  EBL: 20 ml  Total I/O In: 600 [I.V.:600] Out: -   BLOOD ADMINISTERED: none  DRAINS: Medium Hemovac  SPECIMEN:  none  INDICATION FOR PROCEDURE: This patient presented with epidural abscess and doing a lumbar laminectomy last week.  She has been having some serosanguineous drainage from her wound despite stapling.  We were unsure the etiology of the drainage.  Recommended wound exploration and irrigation and debridement. Patient understood the risks, benefits, and alternatives and potential outcomes and wished to proceed.  PROCEDURE DETAILS: The patient was taken to the operating room and after induction of adequate generalized endotracheal anesthesia, the patient was rolled into the prone position on the Wilson frame and all pressure points were padded. The lumbar region was cleaned and then prepped with DuraPrep and draped in the usual sterile fashion. 5 cc of local anesthesia was injected and then a dorsal midline incision was made and carried down to the lumbo sacral fascia. The fascia was opened and the paraspinous musculature was taken down in a subperiosteal fashion to expose the previous laminectomy site.  There was evidence of tissue necrosis throughout.  The tissue just appeared to be devitalized.  Did not find a CSF leak.  We both bluntly and sharply greeted the tissues in the suprafascial space in the subfascial space.  I irrigated with Vashe solution. Achieved hemostasis with bipolar cautery, placed a medium Hemovac drain through separate stab incision and then closed  the muscle and fascia with 0 Vicryl. I closed the subcutaneous tissues with 2-0 Vicryl and the subcuticular tissues with 3-0 Vicryl.  Placed 500 g of myriad fine morsels into the suprafascial space.  The skin was then dressed with sheet of myriad followed by a medium honeycomb. the drapes were removed.  My nurse practitioner was involved in the exposure, the debridement, and the closure. the patient was awakened from general anesthesia and transferred to the recovery room in stable condition. At the end of the procedure all sponge, needle and instrument counts were correct.    PLAN OF CARE: Admit to inpatient   PATIENT DISPOSITION:  PACU - hemodynamically stable.   Delay start of Pharmacological VTE agent (>24hrs) due to surgical blood loss or risk of bleeding:  yes

## 2023-11-05 NOTE — TOC Progression Note (Addendum)
 Transition of Care [facility]) - Progression Note    Patient Details  Name: Gionni Kondracki MRN: [id number] Date of Birth: 1956-04-25  [facility] CM/SW Contact  Ernst Heap Phone Number: [phone] 11/05/2023, 9:14 AM  Clinical Narrative:   9:09 AM- CSW called [facility] and spoke with Whitney in admissions who stated that the patients insurance is OON with their agency. Will not be an option.   [facility] denied the patient.   9:12 AM- CSW called [facility] and spoke with [facility] in admissions. The offer is still currently pending. Will contact CSW with updates and in the hub.   9:17 AM- CSW  called and spoke with patient over the phone to discuss updates. Patient stated that she would like to wait to see if [facility] offers. CSW explained that is not guaranteed and to review accepted bed offers list if she is choosing to still go to a SNF. Patient stated that going home is not an option, and she will choose off of accepted bed list and review with husband as a plan B. CSW will follow up with patient and husband.   Rice Chamorro reached out to CSW via secure chat to notify that the patient won her appeal, CIR is following, and will dc to CIR once medically read.   TOC will continue following.           Expected Discharge Plan and Services                                               Social Determinants of Health (SDOH) Interventions SDOH Screenings   Food Insecurity: No Food Insecurity (10/27/2023)  Housing: Low Risk  (10/27/2023)  Transportation Needs: No Transportation Needs (10/27/2023)  Utilities: Not At Risk (10/27/2023)  Social Connections: Unknown (10/27/2023)  Tobacco Use: Low Risk  (11/01/2023)    Readmission Risk Interventions     No data to display

## 2023-11-05 NOTE — Progress Notes (Addendum)
 Subjective: Patient reports doing well, not much back pain. Still having drainage   Objective: Vital signs in last 24 hours: Temp:  [97.8 F (36.6 C)-98.3 F (36.8 C)] 98.3 F (36.8 C) (05/12 0728) Pulse Rate:  [80-97] 80 (05/12 0728) Resp:  [16-17] 16 (05/12 0728) BP: (124-137)/(72-80) 130/75 (05/12 0728) SpO2:  [98 %-100 %] 99 % (05/12 0728)  Intake/Output from previous day: No intake/output data recorded. Intake/Output this shift: No intake/output data recorded.  Neurologic: Grossly normal  Lab Results: Lab Results  Component Value Date   WBC 10.8 (H) 11/05/2023   HGB 9.8 (L) 11/05/2023   HCT 30.7 (L) 11/05/2023   MCV 85.8 11/05/2023   PLT 433 (H) 11/05/2023   Lab Results  Component Value Date   INR 1.2 10/24/2023   BMET Lab Results  Component Value Date   NA 136 11/03/2023   K 4.0 11/03/2023   CL 100 11/03/2023   CO2 29 11/03/2023   GLUCOSE 179 (H) 11/03/2023   BUN 12 11/03/2023   CREATININE 0.60 11/03/2023   CALCIUM  8.5 (L) 11/03/2023    Studies/Results: No results found.  Assessment/Plan: S/p I&D for epidural abscess. Unfortunately she is draining serosanguinous fluid even after reinforcing her wound with staples on Friday. Please keep patient NPO and we will plan to wash her out again this afternoon.   Given her continued drainage, I have recommended lumbar reexploration to determine the etiology of the drainage.  Given that she had an epidural abscess, we think it is important to look again and wash this out again.  Also cannot rule out CSF leak, though I think this is less likely.  She understands the risk of the surgery include but are not limited to bleeding, infection, CSF leak, lack of relief of symptoms, worsening symptoms, need for further surgery and anesthesia risk.  She agrees to proceed   LOS: 9 days    Kenard Paul [facility] 11/05/2023, 7:39 AM

## 2023-11-06 ENCOUNTER — Other Ambulatory Visit: Payer: Self-pay

## 2023-11-06 ENCOUNTER — Encounter (HOSPITAL_COMMUNITY): Payer: Self-pay | Admitting: Neurological Surgery

## 2023-11-06 DIAGNOSIS — Z4889 Encounter for other specified surgical aftercare: Secondary | ICD-10-CM

## 2023-11-06 DIAGNOSIS — I330 Acute and subacute infective endocarditis: Secondary | ICD-10-CM | POA: Diagnosis not present

## 2023-11-06 DIAGNOSIS — G062 Extradural and subdural abscess, unspecified: Secondary | ICD-10-CM | POA: Diagnosis not present

## 2023-11-06 DIAGNOSIS — R7881 Bacteremia: Secondary | ICD-10-CM | POA: Diagnosis not present

## 2023-11-06 DIAGNOSIS — M545 Low back pain, unspecified: Secondary | ICD-10-CM | POA: Diagnosis not present

## 2023-11-06 LAB — BASIC METABOLIC PANEL WITH GFR
Anion gap: 10 (ref 5–15)
BUN: 19 mg/dL (ref 8–23)
CO2: 26 mmol/L (ref 22–32)
Calcium: 9 mg/dL (ref 8.9–10.3)
Chloride: 101 mmol/L (ref 98–111)
Creatinine, Ser: 0.81 mg/dL (ref 0.44–1.00)
GFR, Estimated: >60 mL/min (ref >60)
Glucose, Bld: 169 mg/dL — ABNORMAL HIGH (ref 70–99)
Potassium: 4.4 mmol/L (ref 3.5–5.1)
Sodium: 137 mmol/L (ref 135–145)

## 2023-11-06 LAB — CBC
HCT: 31.3 % — ABNORMAL LOW (ref 36.0–46.0)
Hemoglobin: 9.9 g/dL — ABNORMAL LOW (ref 12.0–15.0)
MCH: 27.3 pg (ref 26.0–34.0)
MCHC: 31.6 g/dL (ref 30.0–36.0)
MCV: 86.2 fL (ref 80.0–100.0)
Platelets: 469 10*3/uL — ABNORMAL HIGH (ref 150–400)
RBC: 3.63 MIL/uL — ABNORMAL LOW (ref 3.87–5.11)
RDW: 14.4 % (ref 11.5–15.5)
WBC: 13.1 10*3/uL — ABNORMAL HIGH (ref 4.0–10.5)
nRBC: 0 % (ref 0.0–0.2)

## 2023-11-06 LAB — GLUCOSE, CAPILLARY
Glucose-Capillary: 165 mg/dL — ABNORMAL HIGH (ref 70–99)
Glucose-Capillary: 237 mg/dL — ABNORMAL HIGH (ref 70–99)
Glucose-Capillary: 143 mg/dL — ABNORMAL HIGH (ref 70–99)
Glucose-Capillary: 114 mg/dL — ABNORMAL HIGH (ref 70–99)

## 2023-11-06 MED ORDER — SODIUM CHLORIDE 0.9% FLUSH
10.0000 mL | Freq: Two times a day (BID) | INTRAVENOUS | Status: DC
  Administered 2023-11-06 – 2023-11-07 (×2): 10 mL

## 2023-11-06 MED ORDER — METHOCARBAMOL 500 MG PO TABS: 500.0000 mg | ORAL_TABLET | Freq: Three times a day (TID) | ORAL | Status: DC | PRN

## 2023-11-06 MED ORDER — CHLORHEXIDINE GLUCONATE CLOTH 2 % EX PADS
6.0000 | MEDICATED_PAD | Freq: Every day | CUTANEOUS | Status: DC
  Administered 2023-11-07: 6 via TOPICAL

## 2023-11-06 MED ORDER — SODIUM CHLORIDE 0.9% FLUSH: 10.0000 mL | INTRAVENOUS | Status: DC | PRN

## 2023-11-06 MED ORDER — CALCIUM CARBONATE 1250 (500 CA) MG PO TABS
625.0000 mg | ORAL_TABLET | Freq: Every day | ORAL | Status: DC
  Administered 2023-11-07: 625 mg via ORAL
  Filled 2023-11-06: qty 1

## 2023-11-06 NOTE — Progress Notes (Signed)
 Inpatient Rehab Admissions Coordinator:    CIR following, Pt. Approved for CIR but I do not have a bed today. I will follow for potential admit later this week.  Wandalee Gust, MS, CCC-SLP Rehab Admissions Coordinator  [phone] (celll) [phone] (office)

## 2023-11-06 NOTE — Progress Notes (Signed)
 Subjective: Patient reports doing really well, minimal back pain   Objective: Vital signs in last 24 hours: Temp:  [98 F (36.7 C)-98.5 F (36.9 C)] 98.3 F (36.8 C) (05/13 0740) Pulse Rate:  [81-93] 88 (05/13 0740) Resp:  [11-20] 18 (05/13 0740) BP: (112-153)/(63-93) 112/93 (05/13 0740) SpO2:  [94 %-100 %] 97 % (05/13 0740) Weight:  [86.6 kg] 86.6 kg (05/12 1137)  Intake/Output from previous day: 05/12 0701 - 05/13 0700 In: 1003.2 [I.V.:603.2; IV Piggyback:400] Out: -  Intake/Output this shift: No intake/output data recorded.  Neurologic: Grossly normal  Lab Results: Lab Results  Component Value Date   WBC 10.8 (H) 11/05/2023   HGB 9.8 (L) 11/05/2023   HCT 30.7 (L) 11/05/2023   MCV 85.8 11/05/2023   PLT 433 (H) 11/05/2023   Lab Results  Component Value Date   INR 1.2 10/24/2023   BMET Lab Results  Component Value Date   NA 137 11/06/2023   K 4.4 11/06/2023   CL 101 11/06/2023   CO2 26 11/06/2023   GLUCOSE 169 (H) 11/06/2023   BUN 19 11/06/2023   CREATININE 0.81 11/06/2023   CALCIUM  9.0 11/06/2023    Studies/Results: No results found.  Assessment/Plan: Postop day 1 I&D of wound. Continue hemovac drains and wound vac.    LOS: 10 days    Kenard Paul [facility] 11/06/2023, 8:24 AM

## 2023-11-06 NOTE — Progress Notes (Signed)
 Peripherally Inserted Central Catheter Placement  The IV Nurse has discussed with the patient and/or persons authorized to consent for the patient, the purpose of this procedure and the potential benefits and risks involved with this procedure.  The benefits include less needle sticks, lab draws from the catheter, and the patient may be discharged home with the catheter. Risks include, but not limited to, infection, bleeding, blood clot (thrombus formation), and puncture of an artery; nerve damage and irregular heartbeat and possibility to perform a PICC exchange if needed/ordered by physician.  Alternatives to this procedure were also discussed.  Bard Power PICC patient education guide, fact sheet on infection prevention and patient information card has been provided to patient /or left at bedside.    PICC Placement Documentation  PICC Single Lumen 11/06/23 Right Brachial 37 cm 0 cm (Active)  Indication for Insertion or Continuance of Line Prolonged intravenous therapies 11/06/23 2130  Exposed Catheter (cm) 0 cm 11/06/23 2130  Site Assessment Clean, Dry, Intact 11/06/23 2130  Line Status Flushed;Saline locked;Blood return noted 11/06/23 2130  Dressing Type Transparent;Securing device 11/06/23 2130  Dressing Status Antimicrobial disc/dressing in place;Clean, Dry, Intact 11/06/23 2130  Line Care Connections checked and tightened 11/06/23 2130  Line Adjustment (NICU/IV Team Only) No 11/06/23 2130  Dressing Intervention New dressing;Adhesive placed at insertion site (IV team only) 11/06/23 2130  Dressing Change Due 11/13/23 11/06/23 2130       Deedra Pro, Miles Allan 11/06/2023, 9:31 PM

## 2023-11-06 NOTE — Progress Notes (Signed)
 Physical Therapy Treatment Patient Details Name: Kristina Long MRN: [id number] DOB: August 27, 1955 Today's Date: 11/06/2023   History of Present Illness 68 y.o. female admitted 10/26/23 with progressively worsening low back and R flank pain (seen in ED 4/28 and 4/30, eventually sent home), and AMS. MRI concerning for epidural abscess extending from T11-L5 with resultant severe canal stenosis at L1-L2 and L2-L3 and moderate canal stenosis at L3-L4. S/P decompressive lumbar laminectomy T12-L3 5/5. TEE 5/9 showed mitral valve endocarditis. Due to continued drainage from incision, pt s/p I&D with debridement of necrotic tissue on 5/12. PMH includes DM.    PT Comments  Pt seen for continued balance and mobility training after I&D on 5/12. The pt continues to present with deficits in cognition, attention, memory, and awareness of deficits, as well as physical deficits in stability, strength, and endurance. She is able to ambulate when un-challenged without need for UE support, but demos slowed pace, increased sway, deviation from path, and at times assist with addition of balance challenge such as head turns and dual cognitive task. The pt also demos increased errors with mobility and wayfinding when completing cognitive task when mobilizing. Pt reports she is typically active, attending group exercise classes at the gym where she is able to complete single-leg stance balance activities without issue (falling after 1-3 seconds when tested today) and will benefit from intensive therapies after d/c to facilitate return to maximal independence.    If plan is discharge home, recommend the following: A little help with walking and/or transfers;A little help with bathing/dressing/bathroom;Assistance with cooking/housework;Direct supervision/assist for medications management;Direct supervision/assist for financial management;Help with stairs or ramp for entrance;Supervision due to cognitive status   Can travel by private  vehicle     Yes  Equipment Recommendations  None recommended by PT    Recommendations for Other Services       Precautions / Restrictions Precautions Precautions: Back;Fall Precaution Booklet Issued: No Recall of Precautions/Restrictions: Intact Precaution/Restrictions Comments: able to recall BLT, intermittent cueing for adherance Restrictions Weight Bearing Restrictions Per Provider Order: No     Mobility  Bed Mobility               General bed mobility comments: OOB in recliner upon entry    Transfers Overall transfer level: Needs assistance Equipment used: None Transfers: Sit to/from Stand Sit to Stand: Contact guard assist, Mod assist           General transfer comment: pt able to complete from EOB without assist. from low chair without use of arms pt with poor power and posterior LOB needing modA to correct    Ambulation/Gait Ambulation/Gait assistance: Contact guard assist, Min assist Gait Distance (Feet): 150 Feet Assistive device: None Gait Pattern/deviations: Step-through pattern, Decreased stride length, Trunk flexed, Drifts right/left Gait velocity: decr Gait velocity interpretation: <1.31 ft/sec, indicative of household ambulator   General Gait Details: close guard for safety, increased cues for navigating, increased sway and LOB with distraction   Stairs Stairs: Yes Stairs assistance: Min assist Stair Management: One rail Right, Step to pattern, Forwards Number of Stairs: 5 General stair comments: x5 step ups with each foot. no LOB or buckling but dependent on UE support.       Balance Overall balance assessment: Needs assistance Sitting-balance support: No upper extremity supported, Feet supported Sitting balance-Leahy Scale: Good     Standing balance support: No upper extremity supported, During functional activity Standing balance-Leahy Scale: Fair Standing balance comment: min guard for safety, no AD Single Leg Stance -  Right  Leg: 3 Single Leg Stance - Left Leg: 1         High level balance activites: Side stepping, Backward walking, Direction changes, Turns, Sudden stops, Head turns High Level Balance Comments: up to minA with increased deviation from path Standardized Balance Assessment Standardized Balance Assessment : Dynamic Gait Index   Dynamic Gait Index Level Surface: Normal Change in Gait Speed: Mild Impairment Gait with Horizontal Head Turns: Moderate Impairment Gait with Vertical Head Turns: Mild Impairment Gait and Pivot Turn: Mild Impairment Step Over Obstacle: Mild Impairment Step Around Obstacles: Normal Steps: Mild Impairment Total Score: 17      Communication Communication Communication: No apparent difficulties  Cognition Arousal: Alert Behavior During Therapy: WFL for tasks assessed/performed   PT - Cognitive impairments: Attention, Sequencing, Initiation, Memory, Problem solving, Safety/Judgement                       PT - Cognition Comments: cues for attention, limited ability to dual task, poor insight to deficits. spouse voicing concerns and pt tangential with unrelated story about one of her friend's health. increased instructions with novel task Following commands: Impaired Following commands impaired: Follows one step commands with increased time, Follows multi-step commands inconsistently    Cueing Cueing Techniques: Verbal cues, Tactile cues  Exercises Other Exercises Other Exercises: sit-stand from low chair without UE support. x5, modA to CGA Other Exercises: naming animals in alphabetical order, pt with slowed gait, difficulty processing which letter next, increased time. repeating some animals (ex: baby giraffe for "B" then giraffe for "G")    General Comments General comments (skin integrity, edema, etc.): educated on role of acute PT and reason for testing cog with mobility, pt with poor awareness of deficits but spouse able to voice his concerns       Pertinent Vitals/Pain Pain Assessment Pain Assessment: 0-10 Pain Score: 2  Faces Pain Scale: Hurts a little bit Pain Location: back Pain Descriptors / Indicators: Discomfort Pain Intervention(s): Limited activity within patient's tolerance, Monitored during session, Repositioned     PT Goals (current goals can now be found in the care plan section) Acute Rehab PT Goals Patient Stated Goal: to return to workout classes at the gym PT Goal Formulation: With patient/family Time For Goal Achievement: 11/13/23 Potential to Achieve Goals: Good Progress towards PT goals: Progressing toward goals    Frequency    Min 5X/week       AM-PAC PT "6 Clicks" Mobility   Outcome Measure  Help needed turning from your back to your side while in a flat bed without using bedrails?: A Little Help needed moving from lying on your back to sitting on the side of a flat bed without using bedrails?: A Little Help needed moving to and from a bed to a chair (including a wheelchair)?: A Little Help needed standing up from a chair using your arms (e.g., wheelchair or bedside chair)?: A Little Help needed to walk in hospital room?: A Lot (increased cues) Help needed climbing 3-5 steps with a railing? : A Lot (increased cues) 6 Click Score: 16    End of Session Equipment Utilized During Treatment: Gait belt Activity Tolerance: Patient tolerated treatment well Patient left: in bed;with call bell/phone within reach;with bed alarm set;with family/visitor present Nurse Communication: Mobility status PT Visit Diagnosis: Other abnormalities of gait and mobility (R26.89);Muscle weakness (generalized) (M62.81);Pain Pain - part of body:  (back)     Time: 1610-9604 PT Time Calculation (min) (ACUTE ONLY):  33 min  Charges:    $Gait Training: 8-22 mins $Therapeutic Activity: 8-22 mins PT General Charges $$ ACUTE PT VISIT: 1 Visit                     Barnabas Booth, PT, DPT   Acute Rehabilitation  Department Office [phone] Secure Chat Communication Preferred   Lona Rist 11/06/2023, 1:11 PM

## 2023-11-06 NOTE — Progress Notes (Addendum)
 [facility]    Date of Admission:  10/26/2023   Total days of antibiotics 8/ceftriaxone  2gm iv daily          ID: Kristina Long is a 68 y.o. female with  strep pneumonaie MV endocarditis c/b epidural abscess-T12-L3 s/p laminectomy and evacuation of epidural abscess. Principal Problem:   Low back pain Active Problems:   Type 2 diabetes mellitus without complication, without long-term current use of insulin  (HCC)   Dyslipidemia   Right flank pain   Acute encephalopathy   Epidural abscess   S/P lumbar laminectomy   Bacteremia due to Streptococcus pneumoniae   Endocarditis, valve unspecified   Bacteremia    Subjective: POD#1 from re-exploration and soft tissue debridement and irrigation with primary closure yesterday.  Has drain in place. Denies back pain.  Leukocytosis on labs is likely reactive  Medications:   acetaminophen   1,000 mg Oral Q6H WA   amLODipine   10 mg Oral Daily   atorvastatin   10 mg Oral QHS   celecoxib   200 mg Oral Q12H   insulin  aspart  0-20 Units Subcutaneous TID WC   insulin  aspart  0-5 Units Subcutaneous QHS   insulin  aspart  7 Units Subcutaneous TID WC   insulin  glargine-yfgn  30 Units Subcutaneous Daily   linagliptin   5 mg Oral Daily   lisinopril   5 mg Oral Daily   senna-docusate  2 tablet Oral BID   sodium chloride  flush  3 mL Intravenous Q12H   sodium chloride  flush  3 mL Intravenous Q12H   sodium chloride  flush  3-10 mL Intravenous Q12H    Objective: Vital signs in last 24 hours: Temp:  [98 F (36.7 C)-98.5 F (36.9 C)] 98.3 F (36.8 C) (05/13 0740) Pulse Rate:  [81-93] 88 (05/13 0740) Resp:  [11-20] 18 (05/13 0740) BP: (112-153)/(63-93) 112/93 (05/13 0740) SpO2:  [94 %-100 %] 97 % (05/13 0740) Physical Exam  Constitutional:  oriented to person, place, and time. appears well-developed and well-nourished. No distress.  HENT: [place]/AT, PERRLA, no scleral icterus Mouth/Throat: Oropharynx is clear and moist. No  oropharyngeal exudate.  Cardiovascular: Normal rate, regular rhythm and normal heart sounds. Exam reveals no gallop and no friction rub.  No murmur heard.  Pulmonary/Chest: Effort normal and breath sounds normal. No respiratory distress.  has no wheezes.  Neck = supple, no nuchal rigidity Abdominal: Soft. Bowel sounds are normal.  exhibits no distension. There is no tenderness.  Lymphadenopathy: no cervical adenopathy. No axillary adenopathy Back = jp drain in place Neurological: alert and oriented to person, place, and time.  Skin: Skin is warm and dry. No rash noted. No erythema.  Psychiatric: a normal mood and affect.  behavior is normal.   Lab Results Recent Labs    11/05/23 0650 11/06/23 0622 11/06/23 0921  WBC 10.8*  --  13.1*  HGB 9.8*  --  9.9*  HCT 30.7*  --  31.3*  NA 138 137  --   K 4.4 4.4  --   CL 99 101  --   CO2 29 26  --   BUN 17 19  --   CREATININE 0.70 0.81  --    Liver Panel Recent Labs    11/05/23 0650  ALBUMIN 2.4*   Lab Results  Component Value Date   ESRSEDRATE 105 (H) 10/28/2023    Microbiology: Streptococcus pneumoniae      MIC    CEFTRIAXONE  (meningitis) <=0.12 SENS... Sensitive    CEFTRIAXONE  (non-meningitis) <=0.12 SENS.Aaron AasAaron Aas  Sensitive    ERYTHROMYCIN <=0.12 SENS... Sensitive    LEVOFLOXACIN 1 SENSITIVE Sensitive    PENICILLIN (meningitis) <=0.06 SENS... Sensitive    PENICILLIN (non-meningitis) <=0.06 SENS... Sensitive    PENICILLIN (oral) <=0.06 SENS... Sensitive    PENO - penicillin <=0.06     VANCOMYCIN  0.5 SENSITIVE Sensitive    Studies/Results: No results found. IMPRESSIONS     1. Left ventricular ejection fraction, by estimation, is 60 to 65%. The  left ventricle has normal function. The left ventricle has no regional  wall motion abnormalities.   2. Right ventricular systolic function is normal. The right ventricular  size is normal.   3. No left atrial/left atrial appendage thrombus was detected.   4. There is a mobile  mass ( 1.51 cm x 0.482 cm) on the anterior mitral  valve leaflet. Then another mobile mass ( 0.509 cm x 0.532 cm) on th  posterior mitral leaflet. The mitral valve is abnormal. Trivial mitral  valve regurgitation. No evidence of mitral   stenosis.   5. The aortic valve is normal in structure. There is moderate thickening  of the aortic valve. Aortic valve regurgitation is mild. No aortic  stenosis is present.   6. The inferior vena cava is normal in size with greater than 50%  respiratory variability, suggesting right atrial pressure of 3 mmHg.   Conclusion(s)/Recommendation(s): Findings are concerning for  vegetation/infective endocarditis as detailed above.    Assessment/Plan: Strep pneumonaie MV endocarditis with epidural abscess s/p evacuation, last surgery on 5/12 = plan to treat for 6 wk using 5/6 as day 1.   Will place picc line order for her to finish out IV abtx course of ceftriaxone  2gm IV daily  Epidural abscess s/p evacuation = has drain in place. Defer to dr Rochelle Chu for timing of removal. For now continue on iv abtx plan for 6 wk.  MV endocarditis = recommend repeat TTE and follow up with cardiology after conclusion of abtx . CT surgery recommended medical management and repeat TTE as well after treatment.  Leukocytosis = anticipate to continue to trend down  Continue on standard precautions  evaluation of this patient requires complex antimicrobial therapy evaluation and counseling and isolation needs for disease transmission risk assessment and mitigation.  -----------------  Will see back in the ID clinic in 4-wk  Opat orders below----------------------------------- Diagnosis: Strep pneumonaie MV endocarditis and epidural abscess  Culture Result: strep pneumonaie  Allergies  Allergen Reactions   Blueberry [Vaccinium Angustifolium] Swelling and Other (See Comments)    Lips swell    OPAT Orders Discharge antibiotics to be given via PICC line Discharge  antibiotics: Per pharmacy protocol ceftriaxone  2gm IV daily  Duration: 6 wk from may 5th End Date: June 17th  [facility] Care Per Protocol:  Home health RN for IV administration and teaching; PICC line care and labs.    Labs weekly while on IV antibiotics: _x_ CBC with differential _x_ BMP  _x_ CRP _x_ ESR   _x_ Please pull PIC at completion of IV antibiotics   Fax weekly labs to [phone]  Clinic Follow Up Appt: 4 wk  @ [facility]   [facility] for Infectious Diseases Pager: [phone]  11/06/2023, 12:35 PM

## 2023-11-06 NOTE — Plan of Care (Signed)
  Problem: Education: Goal: Ability to describe self-care measures that may prevent or decrease complications (Diabetes Survival Skills Education) will improve Outcome: Progressing Goal: Individualized Educational Video(s) Outcome: Progressing   Problem: Coping: Goal: Ability to adjust to condition or change in health will improve Outcome: Progressing   Problem: Fluid Volume: Goal: Ability to maintain a balanced intake and output will improve Outcome: Progressing   Problem: Health Behavior/Discharge Planning: Goal: Ability to identify and utilize available resources and services will improve Outcome: Progressing Goal: Ability to manage health-related needs will improve Outcome: Progressing   Problem: Metabolic: Goal: Ability to maintain appropriate glucose levels will improve Outcome: Progressing   Problem: Nutritional: Goal: Maintenance of adequate nutrition will improve Outcome: Progressing Goal: Progress toward achieving an optimal weight will improve Outcome: Progressing   Problem: Skin Integrity: Goal: Risk for impaired skin integrity will decrease Outcome: Progressing   Problem: Tissue Perfusion: Goal: Adequacy of tissue perfusion will improve Outcome: Progressing   Problem: Education: Goal: Knowledge of General Education information will improve Description: Including pain rating scale, medication(s)/side effects and non-pharmacologic comfort measures Outcome: Progressing   Problem: Health Behavior/Discharge Planning: Goal: Ability to manage health-related needs will improve Outcome: Progressing   Problem: Clinical Measurements: Goal: Ability to maintain clinical measurements within normal limits will improve Outcome: Progressing Goal: Will remain free from infection Outcome: Progressing Goal: Diagnostic test results will improve Outcome: Progressing Goal: Respiratory complications will improve Outcome: Progressing Goal: Cardiovascular complication will  be avoided Outcome: Progressing   Problem: Activity: Goal: Risk for activity intolerance will decrease Outcome: Progressing   Problem: Nutrition: Goal: Adequate nutrition will be maintained Outcome: Progressing   Problem: Coping: Goal: Level of anxiety will decrease Outcome: Progressing   Problem: Elimination: Goal: Will not experience complications related to bowel motility Outcome: Progressing Goal: Will not experience complications related to urinary retention Outcome: Progressing   Problem: Pain Managment: Goal: General experience of comfort will improve and/or be controlled Outcome: Progressing   Problem: Safety: Goal: Ability to remain free from injury will improve Outcome: Progressing   Problem: Skin Integrity: Goal: Risk for impaired skin integrity will decrease Outcome: Progressing   Problem: Education: Goal: Ability to verbalize activity precautions or restrictions will improve Outcome: Progressing Goal: Knowledge of the prescribed therapeutic regimen will improve Outcome: Progressing Goal: Understanding of discharge needs will improve Outcome: Progressing   Problem: Activity: Goal: Ability to avoid complications of mobility impairment will improve Outcome: Progressing Goal: Ability to tolerate increased activity will improve Outcome: Progressing Goal: Will remain free from falls Outcome: Progressing   Problem: Bowel/Gastric: Goal: Gastrointestinal status for postoperative course will improve Outcome: Progressing   Problem: Clinical Measurements: Goal: Ability to maintain clinical measurements within normal limits will improve Outcome: Progressing Goal: Postoperative complications will be avoided or minimized Outcome: Progressing Goal: Diagnostic test results will improve Outcome: Progressing   Problem: Pain Management: Goal: Pain level will decrease Outcome: Progressing   Problem: Skin Integrity: Goal: Will show signs of wound  healing Outcome: Progressing   Problem: Health Behavior/Discharge Planning: Goal: Identification of resources available to assist in meeting health care needs will improve Outcome: Progressing   Problem: Bladder/Genitourinary: Goal: Urinary functional status for postoperative course will improve Outcome: Progressing

## 2023-11-06 NOTE — Progress Notes (Signed)
 PROGRESS NOTE Kristina Long  ZOX:[id number] DOB: 1956-06-18 DOA: 10/26/2023 PCP: Arlys Berke, MD  Brief Narrative/Hospital Course: 68 year old F with PMH of DM-2 returning to ED for the third time in a week with right back and flank pain and found to have epidural abscess and Streptococcus bacteremia. [name]  She said she had extensive evaluation by different experts outpatient at that time without significant finding.  Eventually pain resolved on its own.  [name], and started feeling similar pain.  [name]  At that time, WBC 19.7 with left shift.  CMP, lipase, UA and CT abdomen and pelvis without significant finding.  Symptoms improved with pain medication and [name]  [name]  This time, she was febrile to 100.5.  WBC 18.4 with left shift.  CT renal stone study with grade 1 L4-L5 anterolisthesis.  Blood cultures obtained.  [name] -Patient returns to ED on 5/2 with right low back pain radiating to right groin and altered mental status.  She accidentally took 8 of the 200 mg ibuprofen, 2 oxycodone  and her muscle relaxers due to pain.   In ED, slightly tachycardic, tachypneic and hypertensive.  WBC 16.5 with left shift.  CMP with hyperglycemia.  UA with moderate Hgb, proteinuria and glucosuria.  Reportedly, she was in severe pain while in ED. MRI was ordered but delayed due to altered mental status.  She was admitted and transferred to [facility] for MRI under anesthesia.  MRI thoracic/lumbar spine concerning for epidural abscess extending from T11-L5 with resultant severe canal stenosis at L1-L2 and L2-L3 and moderate canal stenosis at L3-L4.   -s/ p  decompressive lumbar laminectomy, [name]  Blood and abscess culture with Streptococcus pneumonia. Antibiotic de-escalated to ceftriaxone  per ID.  Repeat blood cultures 10/30/23  NGTD. TEE 5/9>revealed mitral valve endocarditis. Cardiothoracic surgery team has been consulted and advise continued IV antibiotics and follow-up outpatient- they advised "IV antibiotics and monitoring. If vegetation progresses or she develops MR would need surgery"  Plan for CIR.  Consultation: Neurosurgery ID CT surgery  Procedures: 5/5-decompressive lumbar laminectomy, medial facetectomy and foraminostomies from T12-L3 with evacuation of epidural abscess by Dr. Rochelle Chu  5/9 TEE  Subjective: Seen this am AAOx3,husband at bedside Overnight afebrile BP stable on room air Labs - wbc at 13k, hb 9.9g.  Assessment and plan:  Epidural abscess with severe canal stenosis Streptococcus pneumoniae bacteremia Severe lower back due to the above: S/P decompression by Dr. Rochelle Chu on 10/29/2023.TTE OK,TEE revealed mitral valve endocarditis. Cardiothoracic surgery evaluated-advised "IV antibiotics and monitoring. If vegetation progresses or she develops MR would need surgery". Having draining serosanguineous fluid from back despite reinforcing her wound with staples on last Friday- s/p washout by Dr Rochelle Chu 5/12> continue Hemovac and wound VAC  once cleared by neurosurgery plan for PICC placement and IV antibiotics. Cont PT/OT CIR . Will ask ID when PICC can be placed Continue current Rocephin  2 g daily  Acute encephalopathy/delirium: Resolved.  Delirium precaution   Mitral valve endocarditis: Continue IV antibiotics see discussion above w/ CTVS.   Uncontrolled NIDDM-2 with hyperglycemia:  A1c 8.7%. PTA on metformin  and Jardiance  continue holding metformin  cont Jardiance ,ssi, [name], Semglee  30 units and  adjust Recent Labs  Lab 11/05/23 1208 11/05/23 1519 11/05/23 1650 11/05/23 2154 11/06/23 0645  GLUCAP 156* 117* 135* 263* 165*    Essential hypertension:  C controlle on lisinipril and amlodipine .  HLD: Continue statin.   Proteinuria Continue home lisinopril    Hyponatremia: Resolved.  Chronic normocytic anemia: Stable in ~ 9 to 10 g.  Monitor   DVT prophylaxis: SCD's Start: 11/05/23 1641 Code Status:   Code Status: Full Code Family Communication: Updated patient and husband at the bedside Patient status is: Remains hospitalized because of severity of illness Level of care: Med-Surg   Dispo: The patient is from: home            Anticipated disposition: TBD Objective: Vitals last 24 hrs: Vitals:   11/05/23 1631 11/05/23 1943 11/06/23 0026 11/06/23 0740  BP: (!) 140/77 122/67 116/63 (!) 112/93  Pulse: 85 93 81 88  Resp: 16 18 16 18   Temp: 98.3 F (36.8 C) 98.5 F (36.9 C) 98 F (36.7 C) 98.3 F (36.8 C)  TempSrc: Oral Oral Oral Oral  SpO2: 100% 100% 96% 97%  Weight:      Height:        Physical Examination: General exam: alert awake, oriented, pleasant.  HEENT:Oral mucosa moist, Ear/Nose WNL grossly Respiratory system: Bilaterally clear BS,no use of accessory muscle Cardiovascular system: S1 & S2 +, No JVD. Gastrointestinal system: Abdomen soft,NT,ND, BS+ Nervous System: Alert, awake, moving all extremities,and following commands. Extremities: LE edema neg,distal peripheral pulses palpable and warm.  Skin: No rashes,no icterus.  Lower back wound Hemovac wound VAC in place: MSK: Normal muscle bulk,tone, power   Data Reviewed: I have personally reviewed following labs and imaging studies ( see epic result tab) CBC: Recent Labs  Lab 11/01/23 1053 11/02/23 0609 11/03/23 0550 11/05/23 0650 11/06/23 0921  WBC 17.1* 13.0* 11.0* 10.8* 13.1*  NEUTROABS  --   --  6.8 6.8  --   HGB 10.1* 9.3* 9.4* 9.8* 9.9*  HCT 30.9* 29.2* 28.9* 30.7* 31.3*  MCV 83.7 84.6  84.3 85.8 86.2  PLT 344 315 372 433* 469*   CMP: Recent Labs  Lab 10/31/23 0534 11/01/23 1053 11/02/23 0609 11/03/23 0550 11/05/23 0650 11/06/23 0622  NA 139 136 136 136 138 137  K 4.1 3.8 4.2 4.0 4.4 4.4  CL 100 97* 98 100 99 101  CO2 28 28 25 29 29 26   GLUCOSE 190* 182* 226* 179* 150* 169*  BUN 17 15 16 12 17 19   CREATININE 0.79 0.57 0.74 0.60 0.70 0.81  CALCIUM  9.0 8.9 8.7* 8.5* 9.2 9.0  MG 2.1 2.1 2.0 2.0 2.2  --   PHOS 2.5 2.3* 3.7 3.0 4.0  --    GFR: Estimated Creatinine Clearance: 78.3 mL/min (by C-G formula based on SCr of 0.81 mg/dL). Recent Labs  Lab 10/31/23 0534 11/01/23 1053 11/02/23 0609 11/03/23 0550 11/05/23 0650  ALBUMIN 2.3* 2.3* 2.0* 2.1* 2.4*   No results for input(s): "LIPASE", "AMYLASE" in the last 168 hours. No results for input(s): "AMMONIA" in the last 168 hours. Coagulation Profile: No results for input(s): "INR", "PROTIME" in the last 168 hours. Unresulted Labs (From admission, onward)     Start     Ordered   11/07/23 0500  Basic metabolic panel with GFR  Daily,   R      11/06/23 0715   11/07/23 0500  CBC  Daily,   R      11/06/23 0715   11/06/23 1125  Glucose, capillary  Once,   R        11/06/23 1125   11/06/23 0500  Basic metabolic panel with GFR  Daily,   R      11/05/23 1526   11/05/23 0500  CBC with Differential/Platelet  Tomorrow morning,   R        11/04/23 1519           Antimicrobials/Microbiology: Anti-infectives (From admission, onward)    Start     Dose/Rate Route Frequency Ordered Stop   11/05/23 1730  ceFAZolin (ANCEF) IVPB 1 g/50 mL premix  Status:  Discontinued        1 g 100 mL/hr over 30 Minutes Intravenous Every 8 hours 11/05/23 1641 11/05/23 1646   10/31/23 0600  cefTRIAXone  (ROCEPHIN ) 2 g in sodium chloride  0.9 % 100 mL IVPB        2 g 200 mL/hr over 30 Minutes Intravenous Every 24 hours 10/30/23 1012     10/30/23 1000  vancomycin  (VANCOREADY) IVPB 500 mg/100 mL  Status:  Discontinued        500 mg 100  mL/hr over 60 Minutes Intravenous Every 24 hours 10/29/23 1915 10/30/23 1012   10/30/23 1000  cefTRIAXone  (ROCEPHIN ) 2 g in sodium chloride  0.9 % 100 mL IVPB  Status:  Discontinued        2 g 200 mL/hr over 30 Minutes Intravenous Every 12 hours 10/29/23 2202 10/29/23 2203   10/30/23 0600  cefTRIAXone  (ROCEPHIN ) 2 g in sodium chloride  0.9 % 100 mL IVPB  Status:  Discontinued        2 g 200 mL/hr over 30 Minutes Intravenous Every 12 hours 10/30/23 0457 10/30/23 1012   10/29/23 2300  metroNIDAZOLE  (FLAGYL ) IVPB 500 mg  Status:  Discontinued        500 mg 100 mL/hr over 60 Minutes Intravenous Every 12 hours 10/29/23 2202 10/30/23 0457   10/29/23 2300  ceFEPIme  (MAXIPIME ) 2 g in sodium chloride  0.9 % 100 mL IVPB  Status:  Discontinued        2 g 200 mL/hr over 30 Minutes Intravenous Every 12 hours 10/29/23 2209 10/30/23 0457   10/29/23 2200  cefTRIAXone  (ROCEPHIN ) 1 g in sodium chloride  0.9 % 100 mL IVPB  Status:  Discontinued        1 g 200 mL/hr over 30 Minutes Intravenous Every 12 hours 10/29/23 1855 10/29/23 2202   10/29/23 1424  vancomycin  (VANCOCIN ) powder  Status:  Discontinued          As needed 10/29/23 1424 10/29/23 1454   10/29/23 1130  cefTRIAXone  (ROCEPHIN ) 2 g in sodium chloride  0.9 % 100 mL IVPB        2 g 200 mL/hr over 30 Minutes Intravenous  Once 10/29/23 1115 10/29/23 1255   10/29/23 1130  vancomycin  (VANCOCIN ) IVPB 1000 mg/200 mL premix        1,000 mg 200 mL/hr over 60 Minutes Intravenous  Once 10/29/23 1115 10/29/23 1230   10/29/23 1119  sodium chloride  0.9 % with cefTRIAXone  (ROCEPHIN ) ADS Med       Note to Pharmacy: Franceen Inches: cabinet override      10/29/23 1119 10/29/23 1310   10/29/23 1119  vancomycin  (VANCOCIN ) 1-5 GM/200ML-% IVPB       Note to Pharmacy: Franceen Inches: cabinet override      10/29/23 1119 10/29/23 1241  Component Value Date/Time   SDES BLOOD LEFT ARM 10/30/2023 1807   SPECREQUEST  10/30/2023 1807    AEROBIC BOTTLE ONLY Blood  Culture results may not be optimal due to an inadequate volume of blood received in culture bottles   CULT  10/30/2023 1807    NO GROWTH 5 DAYS Performed at [facility], [address] [place], [place] [address]    REPTSTATUS 11/04/2023 FINAL 10/30/2023 1807    Procedures: Procedure(s) (LRB): LUMBAR WOUND DEBRIDEMENT (N/A) Medications reviewed:  Scheduled Meds:  acetaminophen   1,000 mg Oral Q6H WA   amLODipine   10 mg Oral Daily   atorvastatin   10 mg Oral QHS   celecoxib   200 mg Oral Q12H   insulin  aspart  0-20 Units Subcutaneous TID WC   insulin  aspart  0-5 Units Subcutaneous QHS   insulin  aspart  7 Units Subcutaneous TID WC   insulin  glargine-yfgn  30 Units Subcutaneous Daily   linagliptin   5 mg Oral Daily   lisinopril   5 mg Oral Daily   senna-docusate  2 tablet Oral BID   sodium chloride  flush  3 mL Intravenous Q12H   sodium chloride  flush  3 mL Intravenous Q12H   sodium chloride  flush  3-10 mL Intravenous Q12H   Continuous Infusions:  sodium chloride  20 mL/hr at 11/05/23 1659   cefTRIAXone  (ROCEPHIN )  IV 2 g (11/06/23 0630)    Lesa Rape, MD [facility] 11/06/2023, 11:34 AM

## 2023-11-06 NOTE — Anesthesia Postprocedure Evaluation (Signed)
 Anesthesia Post Note  Patient: Kristina Long  Procedure(s) Performed: LUMBAR WOUND DEBRIDEMENT (Back)     Patient location during evaluation: PACU Anesthesia Type: General Level of consciousness: awake and alert Pain management: pain level controlled Vital Signs Assessment: post-procedure vital signs reviewed and stable Respiratory status: spontaneous breathing, nonlabored ventilation, respiratory function stable and patient connected to nasal cannula oxygen  Cardiovascular status: blood pressure returned to baseline and stable Postop Assessment: no apparent nausea or vomiting Anesthetic complications: no   No notable events documented.  Last Vitals:  Vitals:   11/06/23 1429 11/06/23 1431  BP: 132/65 132/65  Pulse: 98 98  Resp: 20 20  Temp: 36.8 C 36.8 C  SpO2: 98% 98%    Last Pain:  Vitals:   11/06/23 1902  TempSrc:   PainSc: 3                  Melvenia Stabs

## 2023-11-06 NOTE — Progress Notes (Signed)
 1 Day Post-Op Procedure(s) (LRB): LUMBAR WOUND DEBRIDEMENT (N/A) Subjective: Went back to the OR yesterday for debridement and drain placement of her back incision.  No chest pain or shortness of breath.  Still very anxious.  Objective: Vital signs in last 24 hours: Temp:  [98 F (36.7 C)-98.5 F (36.9 C)] 98.3 F (36.8 C) (05/13 0740) Pulse Rate:  [81-93] 88 (05/13 0740) Cardiac Rhythm: Normal sinus rhythm (05/12 1615) Resp:  [11-20] 18 (05/13 0740) BP: (112-153)/(63-93) 112/93 (05/13 0740) SpO2:  [94 %-100 %] 97 % (05/13 0740) Weight:  [86.6 kg] 86.6 kg (05/12 1137)  Hemodynamic parameters for last 24 hours:    Intake/Output from previous day: 05/12 0701 - 05/13 0700 In: 1003.2 [I.V.:603.2; IV Piggyback:400] Out: -  Intake/Output this shift: No intake/output data recorded.  General appearance: alert, cooperative, and no distress Heart: regular rate and rhythm no murmur  Lab Results: Recent Labs    11/05/23 0650 11/06/23 0921  WBC 10.8* 13.1*  HGB 9.8* 9.9*  HCT 30.7* 31.3*  PLT 433* 469*   BMET:  Recent Labs    11/05/23 0650 11/06/23 0622  NA 138 137  K 4.4 4.4  CL 99 101  CO2 29 26  GLUCOSE 150* 169*  BUN 17 19  CREATININE 0.70 0.81  CALCIUM  9.2 9.0    PT/INR: No results for input(s): "LABPROT", "INR" in the last 72 hours. ABG No results found for: "PHART", "HCO3", "TCO2", "ACIDBASEDEF", "O2SAT" CBG (last 3)  Recent Labs    11/05/23 1650 11/05/23 2154 11/06/23 0645  GLUCAP 135* 263* 165*    Assessment/Plan: S/P Procedure(s) (LRB): LUMBAR WOUND DEBRIDEMENT (N/A) Mitral valve endocarditissecondary to strep pneumonia Afebrile.  White count up slightly to 13.1 from 10.8.  Remains on ceftriaxone  IV. No indication for surgery currently.  LOS: 10 days    Zelphia Higashi 11/06/2023

## 2023-11-07 ENCOUNTER — Inpatient Hospital Stay (HOSPITAL_COMMUNITY)
Admission: AD | Admit: 2023-11-07 | Discharge: 2023-11-15 | DRG: 559 | Disposition: A | Discharge status: Home / Self Care | Source: Intra-hospital | Attending: Physical Medicine and Rehabilitation | Admitting: Physical Medicine and Rehabilitation

## 2023-11-07 ENCOUNTER — Other Ambulatory Visit: Payer: Self-pay

## 2023-11-07 ENCOUNTER — Encounter (HOSPITAL_COMMUNITY): Payer: Self-pay | Admitting: Internal Medicine

## 2023-11-07 ENCOUNTER — Encounter (HOSPITAL_COMMUNITY): Payer: Self-pay | Admitting: Physical Medicine and Rehabilitation

## 2023-11-07 DIAGNOSIS — Z794 Long term (current) use of insulin: Secondary | ICD-10-CM

## 2023-11-07 DIAGNOSIS — Z791 Long term (current) use of non-steroidal anti-inflammatories (NSAID): Secondary | ICD-10-CM

## 2023-11-07 DIAGNOSIS — Z7984 Long term (current) use of oral hypoglycemic drugs: Secondary | ICD-10-CM | POA: Diagnosis not present

## 2023-11-07 DIAGNOSIS — Z4789 Encounter for other orthopedic aftercare: Principal | ICD-10-CM

## 2023-11-07 DIAGNOSIS — M48061 Spinal stenosis, lumbar region without neurogenic claudication: Secondary | ICD-10-CM | POA: Diagnosis present

## 2023-11-07 DIAGNOSIS — E785 Hyperlipidemia, unspecified: Secondary | ICD-10-CM | POA: Diagnosis present

## 2023-11-07 DIAGNOSIS — M4805 Spinal stenosis, thoracolumbar region: Secondary | ICD-10-CM | POA: Diagnosis present

## 2023-11-07 DIAGNOSIS — G062 Extradural and subdural abscess, unspecified: Secondary | ICD-10-CM | POA: Diagnosis not present

## 2023-11-07 DIAGNOSIS — Z91018 Allergy to other foods: Secondary | ICD-10-CM

## 2023-11-07 DIAGNOSIS — E119 Type 2 diabetes mellitus without complications: Secondary | ICD-10-CM | POA: Diagnosis present

## 2023-11-07 DIAGNOSIS — R809 Proteinuria, unspecified: Secondary | ICD-10-CM

## 2023-11-07 DIAGNOSIS — Z833 Family history of diabetes mellitus: Secondary | ICD-10-CM | POA: Diagnosis not present

## 2023-11-07 DIAGNOSIS — M545 Low back pain, unspecified: Secondary | ICD-10-CM | POA: Diagnosis not present

## 2023-11-07 DIAGNOSIS — Z79899 Other long term (current) drug therapy: Secondary | ICD-10-CM | POA: Diagnosis not present

## 2023-11-07 DIAGNOSIS — G061 Intraspinal abscess and granuloma: Secondary | ICD-10-CM | POA: Diagnosis present

## 2023-11-07 DIAGNOSIS — Z91199 Patient's noncompliance with other medical treatment and regimen due to unspecified reason: Secondary | ICD-10-CM | POA: Diagnosis not present

## 2023-11-07 DIAGNOSIS — M47816 Spondylosis without myelopathy or radiculopathy, lumbar region: Secondary | ICD-10-CM | POA: Diagnosis present

## 2023-11-07 DIAGNOSIS — G9340 Encephalopathy, unspecified: Secondary | ICD-10-CM | POA: Diagnosis not present

## 2023-11-07 DIAGNOSIS — M47817 Spondylosis without myelopathy or radiculopathy, lumbosacral region: Secondary | ICD-10-CM | POA: Diagnosis present

## 2023-11-07 DIAGNOSIS — R739 Hyperglycemia, unspecified: Secondary | ICD-10-CM | POA: Diagnosis not present

## 2023-11-07 DIAGNOSIS — M7989 Other specified soft tissue disorders: Secondary | ICD-10-CM | POA: Diagnosis not present

## 2023-11-07 DIAGNOSIS — I38 Endocarditis, valve unspecified: Secondary | ICD-10-CM | POA: Diagnosis present

## 2023-11-07 DIAGNOSIS — D62 Acute posthemorrhagic anemia: Secondary | ICD-10-CM

## 2023-11-07 DIAGNOSIS — B3321 Viral endocarditis: Secondary | ICD-10-CM

## 2023-11-07 DIAGNOSIS — I10 Essential (primary) hypertension: Secondary | ICD-10-CM | POA: Diagnosis present

## 2023-11-07 DIAGNOSIS — E1165 Type 2 diabetes mellitus with hyperglycemia: Secondary | ICD-10-CM

## 2023-11-07 DIAGNOSIS — B953 Streptococcus pneumoniae as the cause of diseases classified elsewhere: Secondary | ICD-10-CM | POA: Diagnosis present

## 2023-11-07 DIAGNOSIS — K5900 Constipation, unspecified: Secondary | ICD-10-CM | POA: Diagnosis present

## 2023-11-07 DIAGNOSIS — K5901 Slow transit constipation: Secondary | ICD-10-CM | POA: Diagnosis not present

## 2023-11-07 LAB — CBC
HCT: 29.5 % — ABNORMAL LOW (ref 36.0–46.0)
Hemoglobin: 9.2 g/dL — ABNORMAL LOW (ref 12.0–15.0)
MCH: 27.1 pg (ref 26.0–34.0)
MCHC: 31.2 g/dL (ref 30.0–36.0)
MCV: 87 fL (ref 80.0–100.0)
Platelets: 447 10*3/uL — ABNORMAL HIGH (ref 150–400)
RBC: 3.39 MIL/uL — ABNORMAL LOW (ref 3.87–5.11)
RDW: 14.6 % (ref 11.5–15.5)
WBC: 10.9 10*3/uL — ABNORMAL HIGH (ref 4.0–10.5)
nRBC: 0 % (ref 0.0–0.2)

## 2023-11-07 LAB — BASIC METABOLIC PANEL WITH GFR
Anion gap: 11 (ref 5–15)
BUN: 25 mg/dL — ABNORMAL HIGH (ref 8–23)
CO2: 24 mmol/L (ref 22–32)
Calcium: 9.4 mg/dL (ref 8.9–10.3)
Chloride: 101 mmol/L (ref 98–111)
Creatinine, Ser: 0.84 mg/dL (ref 0.44–1.00)
GFR, Estimated: >60 mL/min (ref >60)
Glucose, Bld: 160 mg/dL — ABNORMAL HIGH (ref 70–99)
Potassium: 4.2 mmol/L (ref 3.5–5.1)
Sodium: 136 mmol/L (ref 135–145)

## 2023-11-07 LAB — GLUCOSE, CAPILLARY
Glucose-Capillary: 135 mg/dL — ABNORMAL HIGH (ref 70–99)
Glucose-Capillary: 203 mg/dL — ABNORMAL HIGH (ref 70–99)
Glucose-Capillary: 206 mg/dL — ABNORMAL HIGH (ref 70–99)
Glucose-Capillary: 104 mg/dL — ABNORMAL HIGH (ref 70–99)

## 2023-11-07 MED ORDER — CELECOXIB 200 MG PO CAPS
200.0000 mg | ORAL_CAPSULE | Freq: Two times a day (BID) | ORAL | Status: DC
  Administered 2023-11-07 – 2023-11-15 (×16): 200 mg via ORAL
  Filled 2023-11-07 (×16): qty 1

## 2023-11-07 MED ORDER — SENNOSIDES-DOCUSATE SODIUM 8.6-50 MG PO TABS: 2.0000 | ORAL_TABLET | Freq: Two times a day (BID) | ORAL | Status: DC

## 2023-11-07 MED ORDER — ACETAMINOPHEN 500 MG PO TABS: 1000.0000 mg | ORAL_TABLET | Freq: Four times a day (QID) | ORAL | 0 refills | Status: DC

## 2023-11-07 MED ORDER — ONDANSETRON HCL 4 MG PO TABS: 4.0000 mg | ORAL_TABLET | Freq: Four times a day (QID) | ORAL | Status: DC | PRN

## 2023-11-07 MED ORDER — AMLODIPINE BESYLATE 10 MG PO TABS
10.0000 mg | ORAL_TABLET | Freq: Every day | ORAL | Status: DC
  Administered 2023-11-08 – 2023-11-15 (×8): 10 mg via ORAL
  Filled 2023-11-07 (×8): qty 1

## 2023-11-07 MED ORDER — LISINOPRIL 5 MG PO TABS
5.0000 mg | ORAL_TABLET | Freq: Every day | ORAL | Status: DC
  Administered 2023-11-08 – 2023-11-15 (×8): 5 mg via ORAL
  Filled 2023-11-07 (×8): qty 1

## 2023-11-07 MED ORDER — OXYCODONE HCL 5 MG PO TABS
5.0000 mg | ORAL_TABLET | Freq: Four times a day (QID) | ORAL | Status: DC | PRN
  Administered 2023-11-09 – 2023-11-11 (×7): 5 mg via ORAL
  Filled 2023-11-07 (×10): qty 1

## 2023-11-07 MED ORDER — SODIUM CHLORIDE 0.9 % IV SOLN: 2.0000 g | INTRAVENOUS | Status: AC

## 2023-11-07 MED ORDER — OXYCODONE HCL 5 MG PO TABS: 5.0000 mg | ORAL_TABLET | Freq: Four times a day (QID) | ORAL | Status: DC | PRN

## 2023-11-07 MED ORDER — ONDANSETRON HCL 4 MG/2ML IJ SOLN: 4.0000 mg | Freq: Four times a day (QID) | INTRAMUSCULAR | Status: DC | PRN

## 2023-11-07 MED ORDER — LISINOPRIL 5 MG PO TABS: 5.0000 mg | ORAL_TABLET | Freq: Every day | ORAL | Status: DC

## 2023-11-07 MED ORDER — SENNOSIDES-DOCUSATE SODIUM 8.6-50 MG PO TABS
2.0000 | ORAL_TABLET | Freq: Two times a day (BID) | ORAL | Status: DC
  Administered 2023-11-07 – 2023-11-15 (×15): 2 via ORAL
  Filled 2023-11-07 (×16): qty 2

## 2023-11-07 MED ORDER — POLYETHYLENE GLYCOL 3350 17 G PO PACK
17.0000 g | PACK | Freq: Every day | ORAL | Status: DC
  Administered 2023-11-07 – 2023-11-14 (×8): 17 g via ORAL
  Filled 2023-11-07 (×9): qty 1

## 2023-11-07 MED ORDER — SODIUM CHLORIDE 0.9 % IV SOLN
2.0000 g | INTRAVENOUS | Status: DC
  Administered 2023-11-08 – 2023-11-15 (×8): 2 g via INTRAVENOUS
  Filled 2023-11-07 (×8): qty 20

## 2023-11-07 MED ORDER — INSULIN ASPART 100 UNIT/ML IJ SOLN
7.0000 [IU] | Freq: Three times a day (TID) | INTRAMUSCULAR | Status: DC
  Administered 2023-11-07 – 2023-11-15 (×23): 7 [IU] via SUBCUTANEOUS

## 2023-11-07 MED ORDER — ACETAMINOPHEN 325 MG PO TABS
325.0000 mg | ORAL_TABLET | ORAL | Status: DC | PRN
Start: 2023-11-07 — End: 2023-11-15
  Filled 2023-11-07: qty 2

## 2023-11-07 MED ORDER — INSULIN ASPART 100 UNIT/ML IJ SOLN
0.0000 [IU] | Freq: Three times a day (TID) | INTRAMUSCULAR | Status: DC
  Administered 2023-11-07 – 2023-11-08 (×2): 7 [IU] via SUBCUTANEOUS
  Administered 2023-11-08 – 2023-11-09 (×2): 4 [IU] via SUBCUTANEOUS
  Administered 2023-11-09: 3 [IU] via SUBCUTANEOUS
  Administered 2023-11-10 – 2023-11-11 (×3): 4 [IU] via SUBCUTANEOUS
  Administered 2023-11-11: 3 [IU] via SUBCUTANEOUS
  Administered 2023-11-11: 7 [IU] via SUBCUTANEOUS
  Administered 2023-11-12: 3 [IU] via SUBCUTANEOUS
  Administered 2023-11-12: 4 [IU] via SUBCUTANEOUS
  Administered 2023-11-12: 3 [IU] via SUBCUTANEOUS
  Administered 2023-11-13 (×3): 4 [IU] via SUBCUTANEOUS
  Administered 2023-11-14: 3 [IU] via SUBCUTANEOUS
  Administered 2023-11-14: 4 [IU] via SUBCUTANEOUS
  Administered 2023-11-15: 3 [IU] via SUBCUTANEOUS

## 2023-11-07 MED ORDER — INSULIN ASPART 100 UNIT/ML IJ SOLN: 7.0000 [IU] | Freq: Three times a day (TID) | INTRAMUSCULAR | Status: DC

## 2023-11-07 MED ORDER — POLYETHYLENE GLYCOL 3350 17 G PO PACK: 17.0000 g | PACK | Freq: Two times a day (BID) | ORAL | Status: DC | PRN

## 2023-11-07 MED ORDER — LINAGLIPTIN 5 MG PO TABS
5.0000 mg | ORAL_TABLET | Freq: Every day | ORAL | Status: DC
  Administered 2023-11-08 – 2023-11-15 (×8): 5 mg via ORAL
  Filled 2023-11-07 (×9): qty 1

## 2023-11-07 MED ORDER — CELECOXIB 200 MG PO CAPS: 200.0000 mg | ORAL_CAPSULE | Freq: Two times a day (BID) | ORAL | 0 refills | Status: DC

## 2023-11-07 MED ORDER — LORATADINE 10 MG PO TABS
10.0000 mg | ORAL_TABLET | Freq: Every day | ORAL | Status: DC | PRN
  Administered 2023-11-08: 10 mg via ORAL
  Filled 2023-11-07: qty 1

## 2023-11-07 MED ORDER — CALCIUM CARBONATE 1250 (500 CA) MG PO TABS
625.0000 mg | ORAL_TABLET | Freq: Every day | ORAL | Status: DC
  Administered 2023-11-08 – 2023-11-15 (×8): 625 mg via ORAL
  Filled 2023-11-07 (×9): qty 0.5

## 2023-11-07 MED ORDER — ACETAMINOPHEN 325 MG PO TABS
325.0000 mg | ORAL_TABLET | ORAL | Status: AC | PRN
Start: 2023-11-07 — End: ?

## 2023-11-07 MED ORDER — INSULIN GLARGINE-YFGN 100 UNIT/ML ~~LOC~~ SOLN: 20.0000 [IU] | Freq: Every day | SUBCUTANEOUS | Status: DC

## 2023-11-07 MED ORDER — METHOCARBAMOL 500 MG PO TABS
500.0000 mg | ORAL_TABLET | Freq: Three times a day (TID) | ORAL | Status: DC | PRN
  Administered 2023-11-07 – 2023-11-12 (×6): 500 mg via ORAL
  Filled 2023-11-07 (×6): qty 1

## 2023-11-07 MED ORDER — AMLODIPINE BESYLATE 5 MG PO TABS: 5.0000 mg | ORAL_TABLET | Freq: Every day | ORAL | Status: DC

## 2023-11-07 MED ORDER — INSULIN GLARGINE-YFGN 100 UNIT/ML ~~LOC~~ SOLN
30.0000 [IU] | Freq: Every day | SUBCUTANEOUS | Status: DC
  Administered 2023-11-08 – 2023-11-15 (×7): 30 [IU] via SUBCUTANEOUS
  Filled 2023-11-07 (×9): qty 0.3

## 2023-11-07 MED ORDER — ATORVASTATIN CALCIUM 10 MG PO TABS
10.0000 mg | ORAL_TABLET | Freq: Every day | ORAL | Status: DC
  Administered 2023-11-07 – 2023-11-14 (×8): 10 mg via ORAL
  Filled 2023-11-07 (×8): qty 1

## 2023-11-07 NOTE — Inpatient Diabetes Management (Signed)
 Note plans for D/C to CIR today.  Will follow up once transferred to review insulin  administration, etc.   Thanks,  Josefa Ni, RN, BC-ADM Inpatient Diabetes Coordinator Pager [phone]  (8a-5p)

## 2023-11-07 NOTE — Progress Notes (Signed)
 Physical Therapy Treatment Patient Details Name: Kristina Long MRN: [id number] DOB: 10/27/1955 Today's Date: 11/07/2023   History of Present Illness 68 y.o. female admitted 10/26/23 with progressively worsening low back and R flank pain (seen in ED 4/28 and 4/30, eventually sent home), and AMS. MRI concerning for epidural abscess extending from T11-L5 with resultant severe canal stenosis at L1-L2 and L2-L3 and moderate canal stenosis at L3-L4. S/P decompressive lumbar laminectomy T12-L3 5/5. TEE 5/9 showed mitral valve endocarditis. Due to continued drainage from incision, pt s/p I&D with debridement of necrotic tissue on 5/12. PMH includes DM.    PT Comments  Pt received in supine and agreeable to session. Pt continues to require cues for back precautions during session. Pt demonstrates improved activity tolerance and stability, however continues to require redirection and increased guarding during dual tasks and balance challenges.  Pt demonstrates increased instability during single leg stance exercise and slowed pace when cognitively challenged during ambulation. Pt able to complete stair trial without UE support this session. Pt continues to benefit from PT services to progress toward functional mobility goals.    If plan is discharge home, recommend the following: A little help with walking and/or transfers;A little help with bathing/dressing/bathroom;Assistance with cooking/housework;Direct supervision/assist for medications management;Direct supervision/assist for financial management;Help with stairs or ramp for entrance;Supervision due to cognitive status   Can travel by private vehicle     Yes  Equipment Recommendations  None recommended by PT    Recommendations for Other Services       Precautions / Restrictions Precautions Precautions: Back;Fall Recall of Precautions/Restrictions: Intact Restrictions Weight Bearing Restrictions Per Provider Order: No     Mobility  Bed  Mobility Overal bed mobility: Needs Assistance Bed Mobility: Supine to Sit     Supine to sit: Contact guard     General bed mobility comments: Cues for precautions, but no physical assist needed    Transfers Overall transfer level: Needs assistance Equipment used: None Transfers: Sit to/from Stand Sit to Stand: Contact guard assist           General transfer comment: from EOB    Ambulation/Gait Ambulation/Gait assistance: Contact guard assist Gait Distance (Feet): 500 Feet Assistive device: None Gait Pattern/deviations: Step-through pattern, Decreased stride length, Trunk flexed, Drifts right/left Gait velocity: decr     General Gait Details: CGA for safety due to increased instability with dual tasks and balance challenge   Stairs Stairs: Yes Stairs assistance: Contact guard assist Stair Management: One rail Right, No rails, Forwards Number of Stairs: 2 (x2) General stair comments: CGA for safety, but no LOB. Cues for slower pace for improved safety and stability   Wheelchair Mobility     Tilt Bed    Modified Rankin (Stroke Patients Only)       Balance Overall balance assessment: Needs assistance Sitting-balance support: No upper extremity supported, Feet supported Sitting balance-Leahy Scale: Good Sitting balance - Comments: sitting EOB   Standing balance support: No upper extremity supported, During functional activity Standing balance-Leahy Scale: Fair Standing balance comment: No AD with increased instability with balance challenge and SLS exercise requiring CGA for safety             High level balance activites: Head turns, Other (comment) (Single leg high step forward with heel tap then back x10 reps each LE) High Level Balance Comments: increased instability and multiple mild LOB with single leg stance exercise requiring use of balance strategies and close guard for safety. slower pace with head turns during  ambulation, but no LOB.             Communication Communication Communication: No apparent difficulties  Cognition Arousal: Alert Behavior During Therapy: WFL for tasks assessed/performed   PT - Cognitive impairments: Attention, Sequencing, Initiation, Memory, Problem solving, Safety/Judgement                       PT - Cognition Comments: Pt becomes distracted intermittently during dual tasking, but is able to redirect herself this session Following commands: Impaired Following commands impaired: Follows one step commands with increased time, Follows multi-step commands inconsistently    Cueing Cueing Techniques: Verbal cues  Exercises Other Exercises Other Exercises: high steps forward with heel tap BLE x10 Other Exercises: searching for pictures during ambulation with slowed gait and intermittent distraction requiring redirection    General Comments        Pertinent Vitals/Pain Pain Assessment Pain Assessment: Faces Faces Pain Scale: Hurts a little bit Pain Location: back Pain Descriptors / Indicators: Discomfort Pain Intervention(s): Monitored during session     PT Goals (current goals can now be found in the care plan section) Acute Rehab PT Goals Patient Stated Goal: to return to workout classes at the gym PT Goal Formulation: With patient/family Time For Goal Achievement: 11/13/23 Progress towards PT goals: Progressing toward goals    Frequency    Min 5X/week       AM-PAC PT "6 Clicks" Mobility   Outcome Measure  Help needed turning from your back to your side while in a flat bed without using bedrails?: A Little Help needed moving from lying on your back to sitting on the side of a flat bed without using bedrails?: A Little Help needed moving to and from a bed to a chair (including a wheelchair)?: A Little Help needed standing up from a chair using your arms (e.g., wheelchair or bedside chair)?: A Little Help needed to walk in hospital room?: A Little Help needed  climbing 3-5 steps with a railing? : A Little 6 Click Score: 18    End of Session Equipment Utilized During Treatment: Gait belt Activity Tolerance: Patient tolerated treatment well Patient left: with call bell/phone within reach;in chair Nurse Communication: Mobility status PT Visit Diagnosis: Other abnormalities of gait and mobility (R26.89);Muscle weakness (generalized) (M62.81);Pain     Time: 4098-1191 PT Time Calculation (min) (ACUTE ONLY): 27 min  Charges:    $Gait Training: 23-37 mins PT General Charges $$ ACUTE PT VISIT: 1 Visit                     Michaelle Adolphus, PTA Acute Rehabilitation Services Secure Chat Preferred  [phone]    Michaelle Adolphus 11/07/2023, 10:31 AM

## 2023-11-07 NOTE — Progress Notes (Signed)
 Inpatient Rehab Admissions Coordinator:    I have a CIR bed for this Pt. Today. RN may call report to [phone].   Pt. To admit to CIR for estimated 5-7 days with goal of discharging home with assist from her spouse.   Wandalee Gust, MS, CCC-SLP Rehab Admissions Coordinator  [phone] (celll) [phone] (office)

## 2023-11-07 NOTE — Progress Notes (Signed)
 Occupational Therapy Treatment Patient Details Name: Kristina Long MRN: [id number] DOB: 15-Jun-1956 Today's Date: 11/07/2023   History of present illness 68 y.o. female admitted 10/26/23 with progressively worsening low back and R flank pain (seen in ED 4/28 and 4/30, eventually sent home), and AMS. MRI concerning for epidural abscess extending from T11-L5 with resultant severe canal stenosis at L1-L2 and L2-L3 and moderate canal stenosis at L3-L4. S/P decompressive lumbar laminectomy T12-L3 5/5. TEE 5/9 showed mitral valve endocarditis. Due to continued drainage from incision, pt s/p I&D with debridement of necrotic tissue on 5/12. PMH includes DM.   OT comments  Pt in recliner and agreeable to OT session.  Educated on LB AE, compensatory techniques for dressing with up to min assist.  Will need continued education to ensure carryover of techniques or use of AE.  She completes 3 step ADL task in room with supervision to recall tasks, min guard to min assist functionally.  Requires cueing to recall pulling pants up after toileting, adherence to back precautions during ADLs.  Continue to recommend >3hrs/day inpatient setting at dc.  Will follow.       If plan is discharge home, recommend the following:  A little help with walking and/or transfers;A little help with bathing/dressing/bathroom;Direct supervision/assist for medications management;Supervision due to cognitive status   Equipment Recommendations  Other (comment) (defer)    Recommendations for Other Services Rehab consult    Precautions / Restrictions Precautions Precautions: Back;Fall Precaution Booklet Issued: No Recall of Precautions/Restrictions: Intact Precaution/Restrictions Comments: able to recall BLT, intermittent cueing for adherance; drain Restrictions Weight Bearing Restrictions Per Provider Order: No       Mobility Bed Mobility               General bed mobility comments: OOB in recliner     Transfers Overall transfer level: Needs assistance Equipment used: None Transfers: Sit to/from Stand Sit to Stand: Contact guard assist                 Balance Overall balance assessment: Needs assistance Sitting-balance support: No upper extremity supported, Feet supported Sitting balance-Leahy Scale: Good Sitting balance - Comments: sitting EOB   Standing balance support: No upper extremity supported, During functional activity Standing balance-Leahy Scale: Fair                             ADL either performed or assessed with clinical judgement   ADL Overall ADL's : Needs assistance/impaired                 Upper Body Dressing : Set up;Sitting   Lower Body Dressing: Minimal assistance;Sit to/from stand;With adaptive equipment Lower Body Dressing Details (indicate cue type and reason): educated on AE for LB dressing, needs assist more for R LE but able to complete with AE but will need review of equipment. Toilet Transfer: Electronics engineer Details (indicate cue type and reason): no AD, BSC over toilet Toileting- Clothing Manipulation and Hygiene: Minimal assistance;Sit to/from stand Toileting - Clothing Manipulation Details (indicate cue type and reason): for clothing mgmt and recall to pull pants up, cueing for prec     Functional mobility during ADLs: Contact guard assist;Cueing for safety      Extremity/Trunk Assessment              Vision       Perception     Praxis     Communication Communication Communication: No apparent difficulties  Cognition Arousal: Alert Behavior During Therapy: WFL for tasks assessed/performed Cognition: Cognition impaired     Awareness: Intellectual awareness intact, Online awareness impaired Memory impairment (select all impairments): Short-term memory, Working memory Attention impairment (select first level of impairment): Selective attention Executive functioning  impairment (select all impairments): Problem solving, Organization, Sequencing OT - Cognition Comments: pt able to recall 3 step task to complete in room, requires cueing to adhere functionally to precautions. She does forget to pull her pants up after toieting and requires cueing for this.                 Following commands: Intact        Cueing   Cueing Techniques: Verbal cues  Exercises      Shoulder Instructions       General Comments      Pertinent Vitals/ Pain       Pain Assessment Pain Assessment: Faces Faces Pain Scale: Hurts a little bit Pain Location: back Pain Descriptors / Indicators: Discomfort Pain Intervention(s): Limited activity within patient's tolerance, Monitored during session, Repositioned  Home Living                                          Prior Functioning/Environment              Frequency  Min 2X/week        Progress Toward Goals  OT Goals(current goals can now be found in the care plan section)  Progress towards OT goals: Progressing toward goals  Acute Rehab OT Goals Patient Stated Goal: rehab today OT Goal Formulation: With patient Time For Goal Achievement: 11/13/23 Potential to Achieve Goals: Good  Plan      Co-evaluation                 AM-PAC OT "6 Clicks" Daily Activity     Outcome Measure   Help from another person eating meals?: None Help from another person taking care of personal grooming?: A Little Help from another person toileting, which includes using toliet, bedpan, or urinal?: A Little Help from another person bathing (including washing, rinsing, drying)?: A Little Help from another person to put on and taking off regular upper body clothing?: A Little Help from another person to put on and taking off regular lower body clothing?: A Little 6 Click Score: 19    End of Session    OT Visit Diagnosis: Unsteadiness on feet (R26.81);Other abnormalities of gait and mobility  (R26.89)   Activity Tolerance Patient tolerated treatment well   Patient Left in chair;with call bell/phone within reach   Nurse Communication Mobility status        Time: 1020-1047 OT Time Calculation (min): 27 min  Charges: OT General Charges $OT Visit: 1 Visit OT Treatments $Self Care/Home Management : 23-37 mins  Kristina Long, OT Acute Rehabilitation Services Office [phone] Secure Chat Preferred    Kristina Long 11/07/2023, 11:46 AM

## 2023-11-07 NOTE — Discharge Instructions (Addendum)
 Inpatient Rehab Discharge Instructions  [facility] Discharge date and time: No discharge date for patient encounter.   Activities/Precautions/ Functional Status: Activity: As tolerated Diet: Diabetic diet Wound Care: Routine skin checks Functional status:  ___ No restrictions     ___ Walk up steps independently ___ 24/7 supervision/assistance   ___ Walk up steps with assistance ___ Intermittent supervision/assistance  ___ Bathe/dress independently ___ Walk with walker     _x__ Bathe/dress with assistance ___ Walk Independently    ___ Shower independently ___ Walk with assistance    ___ Shower with assistance ___ No alcohol     ___ Return to work/school ________  Special Instructions: No driving smoking or alcohol  Continue intravenous Rocephin  2 g every 24 hours through 12/11/2023 and stop    COMMUNITY REFERRALS UPON DISCHARGE:      Outpatient: PT                  Agency: [facility]  Phone: [phone]              Appointment Date/Time: Appointment to be scheduled post discharge  IV abx: [facility],  [address], [place] Phone#: [phone]  Medical Equipment/Items Ordered: IV antibiotics (end date 12/11/23) Co-pay confirmed $40.04/week Agency/Supplier: [facility]   My questions have been answered and I understand these instructions. I will adhere to these goals and the provided educational materials after my discharge from the hospital.  Patient/Caregiver Signature _______________________________ Date __________  Clinician Signature _______________________________________ Date __________  Please bring this form and your medication list with you to all your follow-up doctor's appointments.   Information on my medicine - ELIQUIS (apixaban)  Why was Eliquis prescribed for you? Eliquis was prescribed to treat blood clots that may have been found in the veins of your legs (deep vein thrombosis) or in  your lungs (pulmonary embolism) and to reduce the risk of them occurring again.  What do You need to know about Eliquis ? The dose is ONE 5 mg tablet taken TWICE daily.  Eliquis may be taken with or without food.   Try to take the dose about the same time in the morning and in the evening. If you have difficulty swallowing the tablet whole please discuss with your pharmacist how to take the medication safely.  Take Eliquis exactly as prescribed and DO NOT stop taking Eliquis without talking to the doctor who prescribed the medication.  Stopping may increase your risk of developing a new blood clot.  Refill your prescription before you run out.  After discharge, you should have regular check-up appointments with your healthcare provider that is prescribing your Eliquis.    What do you do if you miss a dose? If a dose of ELIQUIS is not taken at the scheduled time, take it as soon as possible on the same day and twice-daily administration should be resumed. The dose should not be doubled to make up for a missed dose.  Important Safety Information A possible side effect of Eliquis is bleeding. You should call your healthcare provider right away if you experience any of the following: Bleeding from an injury or your nose that does not stop. Unusual colored urine (red or dark brown) or unusual colored stools (red or black). Unusual bruising for unknown reasons. A serious fall or if you hit your head (even if there is no bleeding).  Some medicines may interact with Eliquis and might increase your risk of bleeding or clotting while on  Eliquis. To help avoid this, consult your healthcare provider or pharmacist prior to using any new prescription or non-prescription medications, including herbals, vitamins, non-steroidal anti-inflammatory drugs (NSAIDs) and supplements.  This website has more information on Eliquis (apixaban): http://[email or web address].com/eliquis/home

## 2023-11-07 NOTE — Discharge Summary (Signed)
 Physician Discharge Summary  Patient ID: Kristina Long MRN: [id number] DOB/AGE: March 01, 1956 68 y.o.  Admit date: 11/07/2023 Discharge date: 11/15/2023  Discharge Diagnoses:  Principal Problem:   Epidural abscess MV endocarditis Severe canal stenosis at L1-2 and 2-3 and moderate canal stenosis L3-4 Bilateral posterior tibial peroneal DVTs Hypertension Acute blood loss anemia Diabetes mellitus Hyperlipidemia Acute encephalopathy/delirium-resolved Constipation  Discharged Condition: Stable  Significant Diagnostic Studies: VAS US  LOWER EXTREMITY VENOUS (DVT) Result Date: 11/08/2023  Lower Venous DVT Study Patient Name:  Kristina Long  Date of Exam:   11/08/2023 Medical Rec #: [id number]       Accession #:    [id number] Date of Birth: Jun 26, 1956      Patient Gender: F Patient Age:   18 years Exam Location:  [facility] Procedure:      VAS US  LOWER EXTREMITY VENOUS (DVT) Referring Phys: Georjean Kite --------------------------------------------------------------------------------  Indications: Edema. 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 Status                    post decompressive lumbar laminectomy, medial facetectomy and                    foraminotomies from T12-L3 with evacuation of epidural                    abscess 10/29/2023 as well as lumbar reexploration for wound                    drainage with blunt and sharp debridement of soft tissue                    followed by irrigation of Kristina tissue 11/05/2023. Risk Factors: Immobility. Limitations: Patient somnolent and unable to follow directives. Comparison Study: No prior study on file Performing Technologist: Carleene Chase RVS  Examination Guidelines: A complete evaluation includes B-mode imaging, spectral Doppler, color Doppler, and power Doppler  as needed of all accessible portions of each vessel. Bilateral testing is considered an integral part of a complete examination. Limited examinations for reoccurring indications may be performed as noted. Kristina reflux portion of Kristina exam is performed with Kristina patient in reverse Trendelenburg.  +---------+---------------+---------+-----------+----------+--------------+ RIGHT    CompressibilityPhasicitySpontaneityPropertiesThrombus Aging +---------+---------------+---------+-----------+----------+--------------+ CFV      Full           Yes      Yes                                 +---------+---------------+---------+-----------+----------+--------------+ SFJ      Full                                                        +---------+---------------+---------+-----------+----------+--------------+ FV Prox  Full                                                        +---------+---------------+---------+-----------+----------+--------------+  FV Mid   Full           Yes      Yes                                 +---------+---------------+---------+-----------+----------+--------------+ FV DistalFull                                                        +---------+---------------+---------+-----------+----------+--------------+ PFV      Full                                                        +---------+---------------+---------+-----------+----------+--------------+ POP      Full           Yes      Yes                                 +---------+---------------+---------+-----------+----------+--------------+ PTV      None                                         Acute          +---------+---------------+---------+-----------+----------+--------------+ PERO     None                                         Acute          +---------+---------------+---------+-----------+----------+--------------+    +---------+---------------+---------+-----------+----------+--------------+ LEFT     CompressibilityPhasicitySpontaneityPropertiesThrombus Aging +---------+---------------+---------+-----------+----------+--------------+ CFV      Full           Yes      Yes                                 +---------+---------------+---------+-----------+----------+--------------+ SFJ      Full                                                        +---------+---------------+---------+-----------+----------+--------------+ FV Prox  Full                                                        +---------+---------------+---------+-----------+----------+--------------+ FV Mid   Full                                                        +---------+---------------+---------+-----------+----------+--------------+  FV DistalFull                                                        +---------+---------------+---------+-----------+----------+--------------+ PFV      Full                                                        +---------+---------------+---------+-----------+----------+--------------+ POP      Full           Yes      Yes                                 +---------+---------------+---------+-----------+----------+--------------+ PTV                                                   Acute          +---------+---------------+---------+-----------+----------+--------------+ PERO                                                  Acute          +---------+---------------+---------+-----------+----------+--------------+     Summary: RIGHT: - Findings consistent with acute deep vein thrombosis involving Kristina right posterior tibial veins, and right peroneal veins in Kristina mid calf.  - No cystic structure found in Kristina popliteal fossa.  LEFT: - Findings consistent with acute deep vein thrombosis involving Kristina left posterior tibial veins, and left peroneal veins in Kristina mid  calf.   *See table(s) above for measurements and observations. Electronically signed by Delaney Fearing on 11/08/2023 at 6:13:16 PM.    Final    US  EKG SITE RITE Result Date: 11/06/2023 If Site Rite image not attached, placement could not be confirmed due to current cardiac rhythm.  DG Orthopantogram Result Date: 11/02/2023 CLINICAL DATA:  Infection.  Jaw pain. EXAM: ORTHOPANTOGRAM/PANORAMIC COMPARISON:  None Available. FINDINGS: No fracture or lytic lesion is noted.  Poor dentition is noted. IMPRESSION: No acute abnormality seen. Electronically Signed   By: Rosalene Colon M.D.   On: 11/02/2023 13:41   ECHO TEE Result Date: 11/01/2023    TRANSESOPHOGEAL ECHO REPORT   Patient Name:   Kristina Long Date of Exam: 11/01/2023 Medical Rec #:  [id number]      Height:       70.0 in Accession #:    [id number]     Weight:       191.0 lb Date of Birth:  1955/10/14     BSA:          2.047 m Patient Age:    68 years       BP: Patient Gender: F              HR: Exam Location:  Inpatient Procedure: 2D Echo (Both Spectral and Color Flow Doppler were utilized during  procedure). Indications:    bacteremia  History:        Patient has prior history of Echocardiogram examinations.  Sonographer:    Hersey Lorenzo Referring Phys: [id number] HAO MENG PROCEDURE: Kristina transesophogeal probe was passed without difficulty through Kristina esophogus of Kristina patient. Sedation performed by different physician. Kristina patient's vital signs; including heart rate, blood pressure, and oxygen  saturation; remained stable throughout Kristina procedure. Kristina patient developed no complications during Kristina procedure.  IMPRESSIONS  1. Left ventricular ejection fraction, by estimation, is 60 to 65%. Kristina left ventricle has normal function. Kristina left ventricle has no regional wall motion abnormalities.  2. Right ventricular systolic function is normal. Kristina right ventricular size is normal.  3. No left atrial/left atrial appendage thrombus was detected.  4. There is a  mobile mass ( 1.51 cm x 0.482 cm) on Kristina anterior mitral valve leaflet. Then another mobile mass ( 0.509 cm x 0.532 cm) on th posterior mitral leaflet. Kristina mitral valve is abnormal. Trivial mitral valve regurgitation. No evidence of mitral  stenosis.  5. Kristina aortic valve is normal in structure. There is moderate thickening of Kristina aortic valve. Aortic valve regurgitation is mild. No aortic stenosis is present.  6. Kristina inferior vena cava is normal in size with greater than 50% respiratory variability, suggesting right atrial pressure of 3 mmHg. Conclusion(s)/Recommendation(s): Findings are concerning for vegetation/infective endocarditis as detailed above. FINDINGS  Left Ventricle: Left ventricular ejection fraction, by estimation, is 60 to 65%. Kristina left ventricle has normal function. Kristina left ventricle has no regional wall motion abnormalities. Kristina left ventricular internal cavity size was normal in size. There is  no left ventricular hypertrophy. Right Ventricle: Kristina right ventricular size is normal. No increase in right ventricular wall thickness. Right ventricular systolic function is normal. Left Atrium: Left atrial size was normal in size. No left atrial/left atrial appendage thrombus was detected. Right Atrium: Right atrial size was normal in size. Pericardium: There is no evidence of pericardial effusion. Mitral Valve: There is a mobile mass ( 1.51 cm x 0.482 cm) on Kristina anterior mitral valve leaflet. Then another mobile mass ( 0.509 cm x 0.532 cm) on th posterior mitral leaflet. Kristina mitral valve is abnormal. Trivial mitral valve regurgitation. No evidence  of mitral valve stenosis. Tricuspid Valve: Kristina tricuspid valve is normal in structure. Tricuspid valve regurgitation is not demonstrated. No evidence of tricuspid stenosis. Aortic Valve: Kristina aortic valve is normal in structure. There is moderate thickening of Kristina aortic valve. Aortic valve regurgitation is mild. No aortic stenosis is present. Pulmonic Valve:  Kristina pulmonic valve was normal in structure. Pulmonic valve regurgitation is not visualized. No evidence of pulmonic stenosis. Aorta: Kristina aortic root is normal in size and structure. Pulmonary Artery: Kristina pulmonary artery is not well seen. Venous: Kristina left upper pulmonary vein, left lower pulmonary vein, right upper pulmonary vein and right lower pulmonary vein are normal. A normal flow pattern is recorded from Kristina right upper pulmonary vein. Kristina inferior vena cava is normal in size with greater than 50% respiratory variability, suggesting right atrial pressure of 3 mmHg. IAS/Shunts: No atrial level shunt detected by color flow Doppler.   AORTA Ao Asc diam: 2.80 cm Chyrl Crawford Tobb DO Electronically signed by Jerryl Morin DO Signature Date/Time: 11/01/2023/4:24:08 PM    Final    EP STUDY Result Date: 11/01/2023 See surgical note for result.  ECHOCARDIOGRAM COMPLETE Result Date: 10/31/2023    ECHOCARDIOGRAM REPORT   Patient Name:   Kristina Long  Date of Exam: 10/31/2023 Medical Rec #:  [id number]      Height:       70.0 in Accession #:    [id number]     Weight:       87.1 lb Date of Birth:  Jun 17, 1956     BSA:          1.466 m Patient Age:    67 years       BP:           143/73 mmHg Patient Gender: F              HR:           103 bpm. Exam Location:  Inpatient Procedure: 2D Echo, Cardiac Doppler and Color Doppler (Both Spectral and Color            Flow Doppler were utilized during procedure). Indications:    Bacteremia  History:        Patient has no prior history of Echocardiogram examinations.  Sonographer:    Andrena Bang Referring Phys: Bubba Carbo  Sonographer Comments: Difficult study due to suboptimal positioning. Pt experienced severe back pain during exam. IMPRESSIONS  1. Left ventricular ejection fraction, by estimation, is 55 to 60%. Kristina left ventricle has normal function. Kristina left ventricle has no regional wall motion abnormalities. Left ventricular diastolic parameters were normal.  2. Right ventricular  systolic function is normal. Kristina right ventricular size is normal.  3. Left atrial size was mildly dilated.  4. Kristina mitral valve is normal in structure. No evidence of mitral valve regurgitation. No evidence of mitral stenosis.  5. Kristina aortic valve is tricuspid. Aortic valve regurgitation is not visualized. No aortic stenosis is present.  6. Kristina inferior vena cava is normal in size with greater than 50% respiratory variability, suggesting right atrial pressure of 3 mmHg. Conclusion(s)/Recommendation(s): No evidence of valvular vegetations on this transthoracic echocardiogram. Consider a transesophageal echocardiogram to exclude infective endocarditis if clinically indicated. FINDINGS  Left Ventricle: Left ventricular ejection fraction, by estimation, is 55 to 60%. Kristina left ventricle has normal function. Kristina left ventricle has no regional wall motion abnormalities. Kristina left ventricular internal cavity size was normal in size. There is  no left ventricular hypertrophy. Left ventricular diastolic parameters were normal. Right Ventricle: Kristina right ventricular size is normal. No increase in right ventricular wall thickness. Right ventricular systolic function is normal. Left Atrium: Left atrial size was mildly dilated. Right Atrium: Right atrial size was normal in size. Pericardium: There is no evidence of pericardial effusion. Mitral Valve: Kristina mitral valve is normal in structure. No evidence of mitral valve regurgitation. No evidence of mitral valve stenosis. Tricuspid Valve: Kristina tricuspid valve is normal in structure. Tricuspid valve regurgitation is not demonstrated. No evidence of tricuspid stenosis. Aortic Valve: Kristina aortic valve is tricuspid. Aortic valve regurgitation is not visualized. No aortic stenosis is present. Aortic valve mean gradient measures 4.0 mmHg. Aortic valve peak gradient measures 9.0 mmHg. Aortic valve area, by VTI measures 2.99 cm. Pulmonic Valve: Kristina pulmonic valve was normal in structure.  Pulmonic valve regurgitation is not visualized. No evidence of pulmonic stenosis. Aorta: Kristina aortic root is normal in size and structure. Venous: Kristina inferior vena cava is normal in size with greater than 50% respiratory variability, suggesting right atrial pressure of 3 mmHg. IAS/Shunts: No atrial level shunt detected by color flow Doppler.  LEFT VENTRICLE PLAX 2D LVIDd:         4.80 cm  Diastology LVIDs:         3.50 cm      LV e' lateral:   12.30 cm/s LV PW:         1.00 cm      LV E/e' lateral: 7.3 LV IVS:        0.80 cm LVOT diam:     2.10 cm LV SV:         74 LV SV Index:   51 LVOT Area:     3.46 cm  LV Volumes (MOD) LV vol d, MOD A2C: 78.6 ml LV vol d, MOD A4C: 120.0 ml LV vol s, MOD A2C: 22.5 ml LV vol s, MOD A4C: 71.9 ml LV SV MOD A2C:     56.1 ml LV SV MOD A4C:     120.0 ml LV SV MOD BP:      57.2 ml RIGHT VENTRICLE RV S prime:     20.50 cm/s TAPSE (M-mode): 1.7 cm LEFT ATRIUM             Index LA Vol (A2C):   35.3 ml 24.08 ml/m LA Vol (A4C):   55.9 ml 38.13 ml/m LA Biplane Vol: 46.5 ml 31.72 ml/m  AORTIC VALVE AV Area (Vmax):    2.52 cm AV Area (Vmean):   2.25 cm AV Area (VTI):     2.99 cm AV Vmax:           150.00 cm/s AV Vmean:          97.400 cm/s AV VTI:            0.249 m AV Peak Grad:      9.0 mmHg AV Mean Grad:      4.0 mmHg LVOT Vmax:         109.00 cm/s LVOT Vmean:        63.300 cm/s LVOT VTI:          0.215 m LVOT/AV VTI ratio: 0.86  AORTA Ao Asc diam: 3.00 cm MITRAL VALVE MV Area (PHT): 4.99 cm    SHUNTS MV Decel Time: 152 msec    Systemic VTI:  0.22 m MV E velocity: 89.60 cm/s  Systemic Diam: 2.10 cm MV A velocity: 73.40 cm/s MV E/A ratio:  1.22 Dorothye Gathers MD Electronically signed by Dorothye Gathers MD Signature Date/Time: 10/31/2023/5:08:32 PM    Final    DG Lumbar Spine 2-3 Views Result Date: 10/29/2023 CLINICAL DATA:  Elective surgery. EXAM: LUMBAR SPINE - 2-3 VIEW COMPARISON:  Preoperative imaging FINDINGS: Two lateral spot views of Kristina lumbar spine submitted from Kristina operating  room. Film 1 demonstrates surgical instrument projecting posteriorly at Kristina L1 level. Film 2 demonstrates surgical instruments projecting posteriorly at Kristina L1 and upper L2 levels. IMPRESSION: Intraoperative localization during lumbar spine surgery. Electronically Signed   By: Chadwick Colonel M.D.   On: 10/29/2023 15:48   MR THORACIC SPINE WO CONTRAST Result Date: 10/29/2023 CLINICAL DATA:  Mid-back pain, spondyloarthropathy suspected, xray done; Low back pain, spondyloarthropathy suspected, xray done. EXAM: MRI THORACIC AND LUMBAR SPINE WITHOUT CONTRAST TECHNIQUE: Multiplanar and multiecho pulse sequences of Kristina thoracic and lumbar spine were obtained without intravenous contrast. COMPARISON:  None Available. FINDINGS: MRI THORACIC SPINE FINDINGS Alignment:  No substantial sagittal subluxation. Vertebrae: No fracture, evidence of discitis, or bone lesion. Cord:  Normal cord signal. Paraspinal and other soft tissues: At Kristina thoracolumbar junction there is right eccentric paraspinal edema which appears heterogeneous. Disc levels: Heterogeneous posterior epidural collection which extends from T11-T12 inferiorly  into Kristina lumbar spine. Resulting severe canal stenosis at T12-L1 where Kristina collection measures up to 1 cm in thickness. MRI LUMBAR SPINE FINDINGS Segmentation:  Standard. Alignment:  Grade 1 anterolisthesis of L4 on L5. Vertebrae:  No fracture, evidence of discitis, or bone lesion. Conus medullaris and cauda equina: Conus extends to Kristina level. Conus and cauda equina appear normal. Paraspinal and other soft tissues: At Kristina thoracolumbar junction there is right eccentric paraspinal edema which appears heterogeneous. Disc levels: Kristina heterogeneous posterior epidural collection extending from T11-T12 to L4-L5 which measures up to 1.4 cm in thickness at L1-L2. Resulting severe canal stenosis at L1-L2 and L2-L3. Kristina collection tapers inferiorly with mild to moderate canal stenosis at L3-L4. Superimposed  degenerative changes including disc bulging at L2-L3 and facet arthropathy at L4-L5 where there is moderate bilateral foraminal stenosis and grade 1 anterolisthesis. IMPRESSION: 1. Heterogeneous posterior epidural collection extending from T11-T12 to L4-L5, measuring up to 1.4 cm in thickness at L1-L2. Resulting severe canal stenosis at T12-L1, L1-L2 and L2-L3. Differential considerations include epidural hematoma versus abscess. No findings to suggest discitis/osteomyelitis. 2. Heterogeneous right eccentric paraspinal edema at Kristina thoracolumbar junction, which may represent hematoma/contusion or infection. 3. Given Kristina above, recommend neurosurgical consultation and correlation with any history of trauma or evidence of infection. Postcontrast imaging may be helpful to further characterize. 4. Superimposed degenerative changes including disc bulging at L2-L3 and facet arthropathy at L4-L5 where there is moderate bilateral foraminal stenosis and grade 1 anterolisthesis. These results will be called to Kristina ordering clinician or representative by Kristina Radiologist Assistant, and communication documented in Kristina PACS or [employer]. Electronically Signed   By: Stevenson Elbe M.D.   On: 10/29/2023 01:52   MR LUMBAR SPINE WO CONTRAST Result Date: 10/29/2023 CLINICAL DATA:  Mid-back pain, spondyloarthropathy suspected, xray done; Low back pain, spondyloarthropathy suspected, xray done. EXAM: MRI THORACIC AND LUMBAR SPINE WITHOUT CONTRAST TECHNIQUE: Multiplanar and multiecho pulse sequences of Kristina thoracic and lumbar spine were obtained without intravenous contrast. COMPARISON:  None Available. FINDINGS: MRI THORACIC SPINE FINDINGS Alignment:  No substantial sagittal subluxation. Vertebrae: No fracture, evidence of discitis, or bone lesion. Cord:  Normal cord signal. Paraspinal and other soft tissues: At Kristina thoracolumbar junction there is right eccentric paraspinal edema which appears heterogeneous. Disc levels:  Heterogeneous posterior epidural collection which extends from T11-T12 inferiorly into Kristina lumbar spine. Resulting severe canal stenosis at T12-L1 where Kristina collection measures up to 1 cm in thickness. MRI LUMBAR SPINE FINDINGS Segmentation:  Standard. Alignment:  Grade 1 anterolisthesis of L4 on L5. Vertebrae:  No fracture, evidence of discitis, or bone lesion. Conus medullaris and cauda equina: Conus extends to Kristina level. Conus and cauda equina appear normal. Paraspinal and other soft tissues: At Kristina thoracolumbar junction there is right eccentric paraspinal edema which appears heterogeneous. Disc levels: Kristina heterogeneous posterior epidural collection extending from T11-T12 to L4-L5 which measures up to 1.4 cm in thickness at L1-L2. Resulting severe canal stenosis at L1-L2 and L2-L3. Kristina collection tapers inferiorly with mild to moderate canal stenosis at L3-L4. Superimposed degenerative changes including disc bulging at L2-L3 and facet arthropathy at L4-L5 where there is moderate bilateral foraminal stenosis and grade 1 anterolisthesis. IMPRESSION: 1. Heterogeneous posterior epidural collection extending from T11-T12 to L4-L5, measuring up to 1.4 cm in thickness at L1-L2. Resulting severe canal stenosis at T12-L1, L1-L2 and L2-L3. Differential considerations include epidural hematoma versus abscess. No findings to suggest discitis/osteomyelitis. 2. Heterogeneous right eccentric paraspinal edema at Kristina thoracolumbar junction,  which may represent hematoma/contusion or infection. 3. Given Kristina above, recommend neurosurgical consultation and correlation with any history of trauma or evidence of infection. Postcontrast imaging may be helpful to further characterize. 4. Superimposed degenerative changes including disc bulging at L2-L3 and facet arthropathy at L4-L5 where there is moderate bilateral foraminal stenosis and grade 1 anterolisthesis. These results will be called to Kristina ordering clinician or representative  by Kristina Radiologist Assistant, and communication documented in Kristina PACS or [employer]. Electronically Signed   By: Stevenson Elbe M.D.   On: 10/29/2023 01:52   DG Lumbar Spine 1 View Result Date: 10/27/2023 CLINICAL DATA:  1 week of low back pain.  No fall. EXAM: LUMBAR SPINE - 1 VIEW; PELVIS - 1-2 VIEW COMPARISON:  10/24/2023 CT FINDINGS: Pelvis: Single AP view of Kristina pelvis demonstrates no fracture or dislocation. Hip joint spaces are maintained for age. Sacroiliac joints are symmetric. Left central pelvic calcification is secondary to a dystrophic fibroid when correlated with prior CT. Lumbar spine: Single AP view of Kristina lumbar spine demonstrates grossly maintained vertebral body height. Spondylosis at L4-5 and L5-S1. IMPRESSION: No acute osseous abnormality. Limited evaluation lumbar spine secondary to lack of lateral view. Electronically Signed   By: Lore Rode M.D.   On: 10/27/2023 16:21   DG Pelvis 1-2 Views Result Date: 10/27/2023 CLINICAL DATA:  1 week of low back pain.  No fall. EXAM: LUMBAR SPINE - 1 VIEW; PELVIS - 1-2 VIEW COMPARISON:  10/24/2023 CT FINDINGS: Pelvis: Single AP view of Kristina pelvis demonstrates no fracture or dislocation. Hip joint spaces are maintained for age. Sacroiliac joints are symmetric. Left central pelvic calcification is secondary to a dystrophic fibroid when correlated with prior CT. Lumbar spine: Single AP view of Kristina lumbar spine demonstrates grossly maintained vertebral body height. Spondylosis at L4-5 and L5-S1. IMPRESSION: No acute osseous abnormality. Limited evaluation lumbar spine secondary to lack of lateral view. Electronically Signed   By: Lore Rode M.D.   On: 10/27/2023 16:21   CT Renal Stone Study Result Date: 10/24/2023 CLINICAL DATA:  Abdominal/flank pain.  Concern for kidney stone. EXAM: CT ABDOMEN AND PELVIS WITHOUT CONTRAST TECHNIQUE: Multidetector CT imaging of Kristina abdomen and pelvis was performed following Kristina standard protocol without IV  contrast. RADIATION DOSE REDUCTION: This exam was performed according to Kristina departmental dose-optimization program which includes automated exposure control, adjustment of the mA and/or kV according to patient size and/or use of iterative reconstruction technique. COMPARISON:  CT abdomen pelvis dated 10/22/2023. FINDINGS: Evaluation of this exam is limited in [facility] absence of intravenous contrast. Lower chest: Kristina visualized lung bases are clear. No intra-abdominal free air or free fluid. Hepatobiliary: Fatty liver. No biliary dilatation. Kristina gallbladder is unremarkable. Pancreas: Unremarkable. No pancreatic ductal dilatation or surrounding inflammatory changes. Spleen: Normal in size without focal abnormality. Adrenals/Urinary Tract: Kristina adrenal glands unremarkable. Subcentimeter hypodense lesion in Kristina interpolar left kidney is too small to characterize but may represent a cyst or an angiomyolipoma. There is no hydronephrosis or nephrolithiasis on either side. Kristina visualized ureters and urinary bladder appear unremarkable. Stomach/Bowel: There is no bowel obstruction or active inflammation. Kristina appendix is normal. Vascular/Lymphatic: Kristina abdominal aorta and IVC unremarkable. No portal venous gas. There is no adenopathy. Reproductive: Calcified posterior uterine fibroid. No suspicious adnexal masses. Other: Small fat containing umbilical hernia. Musculoskeletal: Grade 1 L4-L5 anterolisthesis. No acute osseous pathology. IMPRESSION: 1. No acute intra-abdominal or pelvic pathology. No hydronephrosis or nephrolithiasis. 2. Fatty liver. 3. No bowel obstruction. Normal  appendix. Electronically Signed   By: Angus Bark M.D.   On: 10/24/2023 18:22   DG Chest Port 1 View Result Date: 10/24/2023 CLINICAL DATA:  Questionable sepsis - evaluate for abnormality EXAM: PORTABLE CHEST - 1 VIEW COMPARISON:  None available. FINDINGS: Streaky bibasilar atelectasis. No focal airspace consolidation, pleural effusion, or  pneumothorax. No cardiomegaly. No acute fracture or destructive lesion. Multilevel thoracic osteophytosis. IMPRESSION: No acute cardiopulmonary abnormality. Electronically Signed   By: Rance Burrows M.D.   On: 10/24/2023 15:29   CT ABDOMEN PELVIS W CONTRAST Result Date: 10/22/2023 CLINICAL DATA:  Right lower quadrant and flank pain for several days. EXAM: CT ABDOMEN AND PELVIS WITH CONTRAST TECHNIQUE: Multidetector CT imaging of Kristina abdomen and pelvis was performed using Kristina standard protocol following bolus administration of intravenous contrast. RADIATION DOSE REDUCTION: This exam was performed according to Kristina departmental dose-optimization program which includes automated exposure control, adjustment of the mA and/or kV according to patient size and/or use of iterative reconstruction technique. CONTRAST:  OMNIPAQUE  IOHEXOL  300 MG/ML  SOLN COMPARISON:  08/29/2020 FINDINGS: Lower Chest: No acute findings. Hepatobiliary: No suspicious hepatic masses identified. Mild steatosis again noted. Gallbladder is unremarkable. No evidence of biliary ductal dilatation. Pancreas:  No mass or inflammatory changes. Spleen: Within normal limits in size and appearance. Adrenals/Urinary Tract: Stable sub-centimeter fat attenuation lesion in midpole of left kidney, consistent with benign angiomyolipoma (No followup imaging is recommended) . No evidence of ureteral calculi or hydronephrosis. Stomach/Bowel: No evidence of obstruction, inflammatory process or abnormal fluid collections. Normal appendix visualized. Vascular/Lymphatic: No pathologically enlarged lymph nodes. No acute vascular findings. Reproductive: Several small uterine fibroids are again seen, some of which are calcified, largest measuring 2.2 cm. Adnexal regions are unremarkable. Other:  Stable small fat-containing umbilical hernia. Musculoskeletal:  No suspicious bone lesions identified. IMPRESSION: No evidence of appendicitis, hydronephrosis, or other  acute findings. Stable mild hepatic steatosis Stable small uterine fibroids. Stable small fat-containing umbilical hernia. Electronically Signed   By: Marlyce Sine M.D.   On: 10/22/2023 08:53    Labs:  Basic Metabolic Panel: Recent Labs  Lab 11/08/23 0550 11/12/23 0552  NA 138 136  K 4.4 4.3  CL 103 102  CO2 28 25  GLUCOSE 159* 148*  BUN 18 19  CREATININE 0.74 0.73  CALCIUM  9.7 9.6    CBC: Recent Labs  Lab 11/08/23 0550 11/12/23 0552  WBC 10.2 7.7  NEUTROABS 7.0  --   HGB 10.0* 9.2*  HCT 30.9* 29.3*  MCV 84.9 86.9  PLT 460* 389    CBG: Recent Labs  Lab 11/13/23 1652 11/14/23 0620 11/14/23 1134 11/14/23 1640 11/14/23 2048  GLUCAP 156* 145* 197* 119* 163*   Family history.  Father with diabetes.  Denies any colon cancer esophageal cancer or rectal cancer  Brief HPI:   Kristina Long is a 68 y.o. right-handed female with history significant for hypertension diabetes mellitus hyperlipidemia.  Per chart review lives with spouse independent prior to admission.  Presented 10/26/2023 with progressive worsening low back pain and right flank pain.  Patient was seen in [facility] [birth year]28 for same complaints and was discharged to home and at that time she had a low-grade fever of 100 per husband.  CT scan renal showed no hydronephrosis and she was provided with a muscle relaxant.  She now presented back to [facility] 5/2 with progressive back pain.  She reportedly accidentally took 8 (200 mg) of ibuprofen and 2 oxycodone  and her muscle relaxer.  Patient was  noted to have altered mental status.  Admission labs 10/26/2023 showed WBC 16,500 with left shift glucose 310 total bilirubin 1.3 hemoglobin A1c 8.7.  She was initially unable to tolerate MRI.  Lumbar spine films 10/27/2023 showed no acute osseous abnormality.  She did have some spondylosis at L4-5 and L5-S1.  MRI lumbar spine and thoracic spine later completed showing heterogeneous posterior epidural collection extending from T11-12 to L4-5,  measuring up to 1.4 cm thickness at L1-2.  Resulting in severe canal stenosis at T12-L1, L1-2 and L2-3.  Neurosurgery Dr. Waymond Hailey consulted patient underwent decompressive lumbar laminectomy foraminotomies T12-L1 L1-2 L2-3 for evacuation of epidural abscess 10/29/2023.  Blood and abscess cultures with Staphylococcus pneumonia and infectious disease consulted initially placed on ceftriaxone .  Repeat blood cultures 10/30/2023 no growth to date.  TEE 5/9 revealed mitral valve endocarditis with mobile mass (1.5 cm x 0.482 cm) on Kristina anterior mitral valve leaflet.  Cardiothoracic surgery team consulted Dr. Luna Salinas advised to continue IV antibiotics and follow-up outpatient.  Patient with persistent wound drainage after initial surgery underwent lumbar reexploration with blunt and sharp debridement of soft tissues followed by irrigation of Kristina tissues and primary closure 11/05/2023 per Dr. Rochelle Chu.  Plan is currently to remain on IV ceftriaxone  2 g intravenously x 6 weeks through 12/11/2019.  Leukocytosis improved 10,900 and monitored.  She reported some low back pain is overall improved.  No difficulty emptying her bladder.  Initial acute encephalopathy resolved .  Therapy evaluations completed due to patient's decreased functional mobility was admitted for a comprehensive rehab program.   Hospital Course: Kristina Long was admitted to rehab 11/07/2023 for inpatient therapies to consist of PT, ST and OT at least three hours five days a week. Past admission physiatrist, therapy team and rehab RN have worked together to provide customized collaborative inpatient rehab.  Pertaining to patient's strep pneumonaie MV endocarditis with epidural abscess extending from T11-L5 with resultant severe canal stenosis at L1-2 and 2-3 and moderate canal stenosis at 3-4.  Status post decompressive lumbar laminectomy foraminotomies from T12-L3 with evacuation of epidural abscess 10/29/2023 as well as lumbar reexploration of wound  drainage with blunt and sharp debridement of soft tissue followed by irrigation of Kristina tissue 11/05/2023.  Patient would follow-up neurosurgery Dr. Waymond Hailey.  No back brace required.  SCDs for DVT prophylaxis venous Doppler studies completed 11/08/2023 for prophylaxis showing bilateral posterior tibial peroneal vein DVTs and neurosurgery contacted and advised to begin Lovenox  and transition to Eliquis on discharge.  Pain managed with use of Celebrex  as well as Robaxin  oxycodone  for breakthrough pain and she was weaned off of Celebrex .  In regards to patient's mitral valve endocarditis IV Rocephin  2 g daily through 12/11/2023 and follow-up infectious disease as well as CVTS Dr. Luna Salinas.  Hypertension controlled continued on Norvasc  and lisinopril  monitored with increased mobility.  Blood sugars monitored hemoglobin A1c 8.7 patient on insulin  therapy and had been on Glucophage  and Januvia  prior to admission which would be resumed and follow-up with her endocrinologist.  Lipitor ongoing for hyperlipidemia.  Acute encephalopathy delirium that resolved patient at cognitive baseline.  Bouts of constipation resolved with laxative assistance.   Blood pressures were monitored on TID basis and remained controlled and monitored  Diabetes has been monitored with ac/hs CBG checks and [name] was use prn for tighter BS control.    Rehab course: During patient's stay in rehab weekly team conferences were held to monitor patient's progress, set goals and discuss barriers to discharge. At admission,  patient required contact-guard minimal assist 150 feet without assistive device contact-guard step pivot transfers  Physical exam.  Blood pressure 128/77 pulse 100 temperature 98.6 respirations 18 oxygen  saturation 99% room air Constitutional.  No acute distress HEENT Head.  Normocephalic and atraumatic Eyes.  Pupils round and reactive to light no discharge without nystagmus Neck.  Supple nontender no JVD without  thyromegaly Cardiac regular rate and rhythm without any extra sounds or murmur heard Abdomen.  Soft nontender positive bowel sounds without rebound Respiratory effort normal no respiratory distress without wheeze Extremities.  No clubbing cyanosis or edema Skin.  L-spine incision with honeycomb dressing.  JP drain with serosanguineous drainage Neurologic.  Alert and oriented x 3 Motor. Right upper extremity left upper extremity 5/5 Right lower extremity hip flexors 4/5 knee extension 4+/5 ADF 5 -/5 APF 5 -/5 Left lower extremity hip flexors 4/5 KE 4+/5 ADF 5 -/5 APF 5 -/5  He/She  has had improvement in activity tolerance, balance, postural control as well as ability to compensate for deficits. He/She has had improvement in functional use RUE/LUE  and RLE/LLE as well as improvement in awareness.  Sessions focused on community ambulation stair navigation and dual tasking.  Patient ambulates over level surfaces with supervision and no assistive device.  Patient navigated hospital stairs to [facility] first floor with contact-guard fading to supervision.  Participated in dual tasking in gift shop with minimal difficulty including recalling items and prices.  ADL focused on bed mobility functional ambulation and standing balance as well as safety awareness.  Patient demonstrated doffing donning socks shoes with and without use of AE at supervision level.  Patient recalled back precautions without verbal cues.  Ambulates to [facility] ADL apartment sit to stand from sofa with supervision.  Practiced stepping in and out of simulated walk-in shower with out and use of AE or grab bars.  SLP follow-up patient verbalized recent medical history and PLOF independent.  SLP engaged patient in medical management task with patient identifying solutions to medical situations presented verbally independent.  Full family teaching completed plan discharge to home       Disposition:  Discharge disposition: 01-Home or Self  Care        Diet: Diabetic diet  Special Instructions: No driving smoking or alcohol  Medications at discharge. 1.  Tylenol  as needed 2.  Norvasc  10 mg p.o. daily 3.  Lipitor 10 mg p.o. nightly 4.  Os-Cal 1 tablet daily 5.  Rocephin  2 g every 24 hours through 12/11/2023 and stop 6.  Claritin -D 1 tablet twice daily as needed allergies 7.  Glucophage  XR 500 mg 2 tabs by mouth twice a day 8.  Lisinopril  5 mg p.o. daily 9.  Robaxin  500 mg p.o. every 8 hours as needed muscle spasms 10.  Oxycodone  5 mg every 6 hours as needed pain 11.  MiraLAX  daily as needed hold for loose stools 12.  Januvia  100 mg daily 13.  Eliquis 5 mg twice daily  Discharge Instructions     [facility] pharmacist to adjust dose for Vancomycin , Aminoglycosides and other anti-infective therapies as requested by physician.   Complete by: As directed    [facility] to provide Cath Flo 2mg    Complete by: As directed    Administer for PICC line occlusion and as ordered by physician for other access device issues.   Anaphylaxis Kit: Provided to treat any anaphylactic reaction to [facility] medication being provided to [facility] patient if First Dose or when requested by physician  Complete by: As directed    Epinephrine 1mg /ml vial / amp: Administer 0.3mg  (0.72ml) subcutaneously once for moderate to severe anaphylaxis, nurse to call physician and pharmacy when reaction occurs and call 911 if needed for immediate care   Diphenhydramine 50mg /ml IV vial: Administer 25-50mg  IV/IM PRN for first dose reaction, rash, itching, mild reaction, nurse to call physician and pharmacy when reaction occurs   Sodium Chloride  0.9% NS 500ml IV: Administer if needed for hypovolemic blood pressure drop or as ordered by physician after call to physician with anaphylactic reaction   Change dressing on IV access line weekly and PRN   Complete by: As directed    Flush IV access with Sodium Chloride  0.9% and Heparin  10 units/ml or  100 units/ml   Complete by: As directed    Home infusion instructions - [facility]   Complete by: As directed    Instructions: Flush IV access with Sodium Chloride  0.9% and Heparin  10units/ml or 100units/ml   Change dressing on IV access line: Weekly and PRN   Instructions Cath Flo 2mg : Administer for PICC Line occlusion and as ordered by physician for other access device   [facility] pharmacist to adjust dose for: Vancomycin , Aminoglycosides and other anti-infective therapies as requested by physician   Method of administration may be changed at [facility] discretion of home infusion pharmacist based upon assessment of [facility] patient and/or caregiver's ability to self-administer [facility]ina medication ordered   Complete by: As directed    Outpatient Parenteral Antibiotic Therapy Information Antibiotic: Ceftriaxone  (Rocephin ) IVPB; Indications for use: epidural abscess and MV endocarditis; End Date: 12/11/2023   Complete by: As directed    Antibiotic: Ceftriaxone  (Rocephin ) IVPB   Indications for use: epidural abscess and MV endocarditis   End Date: 12/11/2023     13.  Januvia  100 mg daily 14.  Eliquis 5 mg twice daily   30-35 minutes were spent completing discharge summary and discharge planning   Follow-up Information     Lovorn, Megan, MD Follow up.   Specialty: Physical Medicine and Rehabilitation Why: No formal follow-up needed Contact information: [address] [place] [address] [phone]         Liane Redman, MD Follow up.   Specialty: Infectious Diseases Why: Call for appointment Contact information: [address] [place] [address] [phone]         Joaquin Mulberry, MD Follow up.   Specialty: Neurosurgery Why: Call for appointment Contact information: [address] [place] [address] [phone]         Zelphia Higashi, MD Follow up.   Specialty: Cardiothoracic  Surgery Why: Call for appointment Contact information: [address] [place] [address] [phone]         Shamleffer, Julian Obey, MD Follow up.   Specialties: Endocrinology, Radiology Why: call for appointment Contact information: [address] [place] [address] [phone]                 Signed: Everlyn Hockey Delberta Folts 11/15/2023, 4:59 AM

## 2023-11-07 NOTE — H&P (Addendum)
 Physical Medicine and Rehabilitation Admission H&P    Chief Complaint  Patient presents with   Drug Overdose   : HPI: Kristina Long is a 68 year old right-handed female with history significant for hypertension, diabetes mellitus, hyperlipidemia.  Per chart review patient lives with spouse.  1 level home one-step to entry.  Independent prior to admission.  Presented 10/26/2023 with progressive worsening low back pain and right flank pain.  Patient was seen in the ED 4/28 and 4/30 for same complaints and was discharged to home and at that time she had a low-grade fever of 100 per her husband.  CT scan renal showed no hydronephrosis and she was provided with a muscle relaxer.  She now presented back to the ED 5/2 with progressive back pain.  She reportedly accidentally took 8 (200 mg) of ibuprofen and 2 oxycodone  and her muscle relaxer.  Patient was noted to have altered mental status.  Admission labs 10/26/2023 showed a WBC of 16,500 with left shift, glucose 310, total bilirubin 1.3, hemoglobin A1c 8.7.  She was initially unable to tolerate MRI.  Lumbar spine films 10/27/2023 showed no acute osseous abnormality.  She did have some spondylosis at L4-5 and L5-S1.  MRI lumbar and thoracic spine completed showing heterogeneous posterior epidural collection extending from T11-T12 to L4-5, measuring up to 1.4 cm in thickness at L1-L2.  Resulting in severe canal stenosis at T12-L1, L1-2 and L2-3.  Neurosurgery Dr. Waymond Hailey consulted patient underwent decompressive lumbar laminectomy, medial facetectomy and foraminotomies T12-L1, L1-L2, L2-L3 for evacuation of epidural abscess 10/29/2023.  Blood and abscess culture with Staphylococcus pneumonia and infectious disease consulted initially placed on ceftriaxone .  Repeat blood cultures 10/30/2023 no growth to date.  TEE 5/9 revealed mitral valve endocarditis with mobile mass (1.5 cm x 0.482 cm) on the anterior mitral valve leaflet..  Cardiothoracic surgery team consulted  Dr. Luna Salinas advised to continue IV antibiotics and follow-up outpatient.  Patient with persistent wound drainage after initial surgery underwent lumbar reexploration with blunt and sharp debridement of soft tissues followed by irrigation of the tissues and primary closure 11/05/2023 per Dr. Waymond Hailey.  Plan is currently to remain on IV ceftriaxone  2 g intravenously daily x 6 weeks using 10/30/18/2025 as day 1.  Leukocytosis improved 10,900 as well as mental status back to baseline.  She reports her lower back pain is overall under control.  Reports no difficulty emptying her bladder.  Reports last bowel movement was 1 to 2 days ago, continent.  Therapy evaluations completed due to patient decreased functional mobility was admitted for a comprehensive rehab program  Review of Systems  Constitutional:  Positive for chills and fever.  HENT:  Negative for hearing loss.   Eyes:  Positive for blurred vision. Negative for double vision.  Respiratory:  Negative for cough, shortness of breath and wheezing.   Cardiovascular:  Negative for chest pain, palpitations and leg swelling.  Gastrointestinal:  Positive for constipation. Negative for heartburn, nausea and vomiting.  Genitourinary:  Positive for flank pain. Negative for dysuria and hematuria.  Musculoskeletal:  Positive for back pain and myalgias.  Skin:  Negative for rash.  Neurological:  Positive for sensory change and weakness. Negative for speech change.  All other systems reviewed and are negative.  Past Medical History:  Diagnosis Date   Allergy    Diabetes mellitus without complication [facility])    Past Surgical History:  Procedure Laterality Date   LUMBAR LAMINECTOMY FOR EPIDURAL ABSCESS N/A 10/29/2023   Procedure: LUMBAR LAMINECTOMY  FOR EPIDURAL ABSCESS;  Surgeon: Joaquin Mulberry, MD;  Location: [facility] OR;  Service: Neurosurgery;  Laterality: N/A;   LUMBAR WOUND DEBRIDEMENT N/A 11/05/2023   Procedure: LUMBAR WOUND DEBRIDEMENT;  Surgeon:  Joaquin Mulberry, MD;  Location: [facility] OR;  Service: Neurosurgery;  Laterality: N/A;  IRRIGATION AND DEBRIDEMENT LUMBAR WOUND   TRANSESOPHAGEAL ECHOCARDIOGRAM (CATH LAB) N/A 11/01/2023   Procedure: TRANSESOPHAGEAL ECHOCARDIOGRAM;  Surgeon: Jerryl Morin, DO;  Location: [facility] INVASIVE CV LAB;  Service: Cardiovascular;  Laterality: N/A;   Family History  Problem Relation Age of Onset   Diabetes Father    Diabetes Maternal Grandmother    Social History:  reports that she has never smoked. She has never used smokeless tobacco. She reports that she does not drink alcohol and does not use drugs. Allergies:  Allergies  Allergen Reactions   Blueberry [Vaccinium Angustifolium] Swelling and Other (See Comments)    Lips swell   Medications Prior to Admission  Medication Sig Dispense Refill   acetaminophen  (TYLENOL ) 500 MG tablet Take 2 tablets (1,000 mg total) by mouth every 6 (six) hours for 2 days. 16 tablet 0   [START ON 11/08/2023] amLODipine  (NORVASC ) 5 MG tablet Take 1 tablet (5 mg total) by mouth daily.     atorvastatin  (LIPITOR) 10 MG tablet Take 1 tablet (10 mg total) by mouth daily. (Patient taking differently: Take 10 mg by mouth at bedtime.) 90 tablet 3   calcium  carbonate (OS-CAL) 600 MG tablet Take 600 mg by mouth daily.     [START ON 11/08/2023] cefTRIAXone  2 g in sodium chloride  0.9 % 100 mL Inject 2 g into the vein daily.     celecoxib  (CELEBREX ) 200 MG capsule Take 1 capsule (200 mg total) by mouth every 12 (twelve) hours for 2 days. 4 capsule 0   CLARITIN -D 12 HOUR 5-120 MG tablet Take 1 tablet by mouth 2 (two) times daily as needed for allergies.     insulin  aspart (NOVOLOG ) 100 UNIT/ML injection Inject 7 Units into the skin 3 (three) times daily with meals.     [START ON 11/08/2023] insulin  glargine-yfgn (SEMGLEE ) 100 UNIT/ML injection Inject 0.2 mLs (20 Units total) into the skin daily.     Lancets (ONETOUCH DELICA PLUS LANCET33G) MISC USE AS INSTRUCTED TO CHECK BLOOD SUGAR 2 TIMES PER  DAY 100 each 0   [START ON 11/08/2023] lisinopril  (ZESTRIL ) 5 MG tablet Take 1 tablet (5 mg total) by mouth daily.     metaxalone  (SKELAXIN ) 800 MG tablet Take 1 tablet (800 mg total) by mouth 3 (three) times daily. 21 tablet 0   methocarbamol  (ROBAXIN ) 500 MG tablet Take 1 tablet (500 mg total) by mouth every 8 (eight) hours as needed. (Patient taking differently: Take 500 mg by mouth every 8 (eight) hours as needed for muscle spasms.) 8 tablet 0   ONETOUCH VERIO test strip USE AS DIRECTED TO TEST BLOOD SUGAR TWICE A DAY 100 strip 5   oxyCODONE  (OXY IR/ROXICODONE ) 5 MG immediate release tablet Take 1 tablet (5 mg total) by mouth every 6 (six) hours as needed for moderate pain (pain score 4-6).     polyethylene glycol (MIRALAX  / GLYCOLAX ) 17 g packet Take 17 g by mouth 2 (two) times daily as needed for mild constipation.     senna-docusate (SENOKOT-S) 8.6-50 MG tablet Take 2 tablets by mouth 2 (two) times daily.     sitaGLIPtin  (JANUVIA ) 100 MG tablet Take 1 tablet (100 mg total) by mouth daily. 90 tablet 2  Home: Home Living Family/patient expects to be discharged to:: Private residence Living Arrangements: Spouse/significant other Available Help at Discharge: Family, Available PRN/intermittently Type of Home: House Home Access: Stairs to enter Secretary/administrator of Steps: 1 Entrance Stairs-Rails: None Home Layout: One level Bathroom Shower/Tub: Psychologist, counselling, Engineer, manufacturing systems: Standard Bathroom Accessibility: Yes Home Equipment: Cane - single point, Environmental education officer Comments: husband works   Functional History: Prior Function Prior Level of Function : Independent/Modified Independent Mobility Comments: typically indep, active, drives, does classes at [employer] 2-3x/wk. poor historian regarding onset of weakness/pain and difficulty moving at home; states weeks, then states it was never this bad, etc. despite reorientation to recent ED visits due to pain    Functional Status:  Mobility: Bed Mobility Overal bed mobility: Needs Assistance Bed Mobility: Sidelying to Sit Rolling: Mod assist, Max assist Sidelying to sit: Mod assist Supine to sit: Mod assist, Used rails, HOB elevated Sit to sidelying: Min assist General bed mobility comments: OOB in recliner upon entry Transfers Overall transfer level: Needs assistance Equipment used: None Transfers: Sit to/from Stand Sit to Stand: Contact guard assist, Mod assist Bed to/from chair/wheelchair/BSC transfer type:: Step pivot, Stand pivot Stand pivot transfers: Min assist Step pivot transfers: Contact guard assist General transfer comment: pt able to complete from EOB without assist. from low chair without use of arms pt with poor power and posterior LOB needing modA to correct Ambulation/Gait Ambulation/Gait assistance: Contact guard assist, Min assist Gait Distance (Feet): 150 Feet Assistive device: None Gait Pattern/deviations: Step-through pattern, Decreased stride length, Trunk flexed, Drifts right/left General Gait Details: close guard for safety, increased cues for navigating, increased sway and LOB with distraction Gait velocity: decr Gait velocity interpretation: <1.31 ft/sec, indicative of household ambulator Stairs: Yes Stairs assistance: Min assist Stair Management: One rail Right, Step to pattern, Forwards Number of Stairs: 5 General stair comments: x5 step ups with each foot. no LOB or buckling but dependent on UE support.   ADL: ADL Overall ADL's : Needs assistance/impaired Eating/Feeding: Set up, Bed level Grooming: Set up, Sitting Upper Body Dressing : Set up, Sitting Lower Body Dressing: Moderate assistance, Sit to/from stand Lower Body Dressing Details (indicate cue type and reason): Pt unable to follow cues for full figure four for donning socks Toilet Transfer: Contact guard assist, Ambulation Toilet Transfer Details (indicate cue type and reason): no  AD Toileting- Clothing Manipulation and Hygiene: Minimal assistance, Sit to/from stand Toileting - Clothing Manipulation Details (indicate cue type and reason): Min assist to steady while standing & adhere to back precautions Functional mobility during ADLs: Contact guard assist General ADL Comments: focus on cogntion today, mobilizing well   Cognition: Cognition Orientation Level: Oriented X4 Cognition Arousal: Alert Behavior During Therapy: WFL for tasks assessed/performed  Physical Exam: Blood pressure 128/77, pulse 100, temperature 98.6 F (37 C), temperature source Oral, resp. rate 18, height 5\' 10"  (1.778 m), weight 85.6 kg, SpO2 99%.  General: No apparent distress, appears comfortable laying in bed, obese HEENT: Head is normocephalic, atraumatic, sclera anicteric, oral mucosa pink and moist, dentition intact Neck: Supple without JVD or lymphadenopathy Heart: Reg rate and rhythm. No murmurs rubs or gallops Chest: CTA bilaterally without wheezes, rales, or rhonchi; no distress Abdomen: Soft, non-tender, protuberant, bowel sounds positive. Extremities: No clubbing, cyanosis, or edema. Pulses are 2+ Psych: Pt's affect is appropriate. Pt is cooperative.  She is very pleasant Skin: L-spine incision with honeycomb dressing, JP drain with serosanguineous drainage Neuro:    Mental Status: AAOx3 Speech/Languate:  fluent, follows simple commands CRANIAL NERVES: 2 through 12 grossly intact   MOTOR: RUE: 5/5 Deltoid, 5/5 Biceps, 5/5 Triceps,5/5 Grip LUE: 5/5 Deltoid, 5/5 Biceps, 5/5 Triceps, 5/5 Grip RLE: HF 4/5, KE 4+/5, ADF 5-/5, APF 5-/5 LLE: HF 4/5, KE 4+/5, ADF 5-/5, APF 5-/5   SENSORY: Normal to touch all 4 extremities, patient reports altered sensation in distal toes      Results for orders placed or performed during the hospital encounter of 11/07/23 (from the past 48 hours)  Glucose, capillary     Status: Abnormal   Collection Time: 11/07/23  4:07 PM  Result Value  Ref Range   Glucose-Capillary 206 (H) 70 - 99 mg/dL    Comment: Glucose reference range applies only to samples taken after fasting for at least 8 hours.   US  EKG SITE RITE Result Date: 11/06/2023 If Site Rite image not attached, placement could not be confirmed due to current cardiac rhythm.     Blood pressure 128/77, pulse 100, temperature 98.6 F (37 C), temperature source Oral, resp. rate 18, height 5\' 10"  (1.778 m), weight 85.6 kg, SpO2 99%.  Medical Problem List and Plan: 1. Functional deficits secondary to strep pneumonaie MV endocarditis with epidural abscess extending from T11-L5 with resultant severe canal stenosis at L1-2 and L2-3 and moderate canal stenosis at L3-4.  Status post decompressive lumbar laminectomy, medial facetectomy and foraminotomies from T12-L3 with evacuation of epidural abscess 10/29/2023 as well as lumbar reexploration for wound drainage with blunt and sharp debridement of soft tissue followed by irrigation of the tissue 11/05/2023.NO BRACE REQUIRED  -patient may shower if lumbar drain/incision can be kept dry  -ELOS/Goals: 12-14  -Admit to CIR 2.  Antithrombotics: -DVT/anticoagulation:  Mechanical: Antiembolism stockings, thigh (TED hose) Bilateral lower extremities  -antiplatelet therapy: N/A 3. Pain Management: Celebrex  200 mg every 12 hours, Robaxin  and oxycodone  as needed 4. Mood/Behavior/Sleep: Provide emotional support  -antipsychotic agents: N/A 5. Neuropsych/cognition: This patient is capable of making decisions on her own behalf. 6. Skin/Wound Care: Routine skin checks 7. Fluids/Electrolytes/Nutrition: Routine in and outs with follow-up chemistries 8.  Mitral valve endocarditis.  IV Rocephin  2 g daily x 6 weeks USING 10/30/2023 AS DAY #1 Ending 12/11/2023 .  Follow-up infectious disease Dr. Artemio Larry as well as outpatient CVTS Dr. Luna Salinas.  Patient has PICC line 9.  Leukocytosis/acute blood loss anemia.  Follow-up CBC 10.  Hypertension.  Norvasc  10 mg  daily, lisinopril  5 mg daily.  Monitor with increased mobility  - Hospitalist recommends stop amlodipine  first if BP soft 11.  Diabetes mellitus.  Hemoglobin A1c 8.7.  Currently on NovoLog  7 units 3 times daily, Semglee  30 units daily, Tradjenta  5 mg daily.  Patient on Glucophage  1000 mg twice daily and Januvia  100 mg daily prior to admission.  Monitor with increased activity 12.  Hyperlipidemia.  Lipitor 13.  Acute encephalopathy/delirium.  Resolved. 14.  Proteinuria.  Continue lisinopril  15. Constipation  Scheduled MiraLAX , continue Senokot   Sterling Eisenmenger, PA-C 11/07/2023  I have personally performed a face to face diagnostic evaluation of this patient and formulated the key components of the plan.  Additionally, I have personally reviewed laboratory data, imaging studies, as well as relevant notes and concur with the physician assistant's documentation above.  The patient's status has not changed from the original H&P.  Any changes in documentation from the acute care chart have been noted above.  Lylia Sand, MD

## 2023-11-07 NOTE — Discharge Summary (Signed)
 Physician Discharge Summary  [facility] RUE:[id number] DOB: 1956/05/13 DOA: 10/26/2023  PCP: Arlys Berke, MD  Admit date: 10/26/2023 Discharge date: 11/07/2023 Recommendations for Outpatient Follow-up:  Follow up with PCP in 1 weeks-call for appointment Follow-up with neurosurgery at CIR Follow-up with infectious disease on completion of antibiotics and during the antibiotic treatment Please obtain BMP/CBC in one week  Discharge Dispo: CIR Discharge Condition: Stable Code Status:   Code Status: Full Code Diet recommendation:  Diet Order             Diet Carb Modified Fluid consistency: Thin; Room service appropriate? Yes  Diet effective now                    Brief/Interim Summary: 68 year old F with PMH of DM-2 returning to ED for the third time in a week with right back and flank pain and found to have epidural abscess and Streptococcus bacteremia. [name]  She said she had extensive evaluation by different experts outpatient at that time without significant finding.  Eventually pain resolved on its own.  [name], and started feeling similar pain.  [name]  At that time, WBC 19.7 with left shift.  CMP, lipase, UA and CT abdomen and pelvis without significant finding.  Symptoms improved with pain medication and [name]  [name]  This time, she was febrile to 100.5.  WBC 18.4 with left shift.  CT renal stone study with grade 1 L4-L5 anterolisthesis.  Blood cultures obtained.  [name] -Patient returns to ED on 5/2 with right low back pain radiating to right groin and altered mental status.  [name], 2 oxycodone  and her muscle relaxers due to pain.   In ED, slightly tachycardic, tachypneic and hypertensive.  WBC 16.5 with left shift.  CMP with hyperglycemia.  UA with moderate Hgb, proteinuria and glucosuria.  Reportedly, she was in severe pain while in ED. MRI was ordered but delayed due to altered mental status.  [name]  MRI thoracic/lumbar spine concerning for epidural abscess extending from T11-L5 with resultant severe canal stenosis at L1-L2 and L2-L3 and moderate canal stenosis at L3-L4.   -s/ p decompressive lumbar laminectomy, [name]  Blood and abscess culture with Streptococcus pneumonia. Antibiotic de-escalated to ceftriaxone  per ID.  Repeat blood cultures 10/30/23  NGTD. TEE 5/9>revealed mitral valve endocarditis. Cardiothoracic surgery team has been consulted and advise continued IV antibiotics and follow-up outpatient- they advised "IV antibiotics and monitoring. If vegetation progresses or she develops MR would need surgery" .[name]  Consultation: Neurosurgery ID CT surgery  Procedures: 5/5-decompressive lumbar laminectomy, medial facetectomy and foraminostomies from T12-L3 with evacuation of epidural abscess by Dr. Rochelle Chu  5/9 TEE 5/12 lumbar exploration, debridement of soft tissue and irrigation for wound drainage and soft tissue necrosis 5/13:PICC RUE  [name], [name]  Discharge diagnosis  Epidural abscess with severe canal stenosis Streptococcus pneumoniae bacteremia Severe lower back due to the above: S/P decompression by Dr. Rochelle Chu on 10/29/2023.TTE OK,[name], [name], [name], [name] ,ssi, [name], [name] Recent Labs  Lab 11/06/23 0645 11/06/23 1125 11/06/23 1644 11/06/23 2200 11/07/23 0630  GLUCAP 165* 237* 143* 114* 135*    Essential hypertension:  Controlled on lisinipril- now at 5mg  daily and amlodipine  started here new- will do at 5mg - if bp soft stop AMLODIPINE   HLD: Continue statin.   Proteinuria Continue home lisinopril    Hyponatremia: Resolved.  Chronic normocytic anemia: Stable in ~ 9 to 10 g.  Monitor Discharge Exam: Vitals:   11/07/23 0500 11/07/23 0756  BP: 109/63 (!) 118/59  Pulse: 78 87  Resp: 17 17  Temp: 98 F (36.7 C) (!) 97.5 F (36.4 C)  SpO2: 99% 96%   General: Pt is alert, awake, not in acute distress Cardiovascular: RRR, S1/S2 +, no rubs, no gallops Respiratory: CTA bilaterally, no wheezing, no rhonchi Abdominal: Soft, NT, ND, bowel sounds + Extremities: no edema, no cyanosis  Discharge Instructions   Allergies as of 11/07/2023        Reactions   Blueberry [vaccinium Angustifolium] Swelling, Other (See Comments)   Lips swell        Medication List     STOP taking these medications    metFORMIN  500 MG 24 hr tablet Commonly known as: GLUCOPHAGE -XR   oxyCODONE -acetaminophen  5-325 MG tablet Commonly known as: PERCOCET/ROXICET       TAKE these medications    acetaminophen  500 MG tablet Commonly known as: TYLENOL  Take 2 tablets (1,000 mg total) by mouth every 6 (six) hours for 2 days.   amLODipine  5 MG tablet Commonly known as: NORVASC  Take 1 tablet (5 mg total) by mouth daily. Start taking on: Nov 08, 2023   atorvastatin  10 MG tablet Commonly known as: LIPITOR Take 1 tablet (10 mg total) by mouth daily. What changed: when to take this   calcium  carbonate 600 MG tablet Commonly known as: OS-CAL Take 600 mg by mouth daily.   cefTRIAXone  2 g in sodium chloride  0.9 % 100 mL Inject 2 g into the vein daily. Start taking on: Nov 08, 2023   celecoxib  200 MG capsule Commonly known as: CELEBREX  Take 1 capsule (200 mg total) by mouth every 12 (twelve) hours for 2 days.   Claritin -D 12 Hour 5-120 MG tablet Generic drug: loratadine -pseudoephedrine Take 1 tablet by mouth 2 (two) times daily as needed for allergies.  insulin  aspart 100 UNIT/ML injection Commonly known as: novoLOG  Inject 7 Units into the skin 3 (three) times daily with meals.   insulin  glargine-yfgn 100 UNIT/ML injection Commonly known as: SEMGLEE  Inject 0.2 mLs (20 Units total) into the skin daily. Start taking on: Nov 08, 2023   lisinopril  5 MG tablet Commonly known as: ZESTRIL  Take 1 tablet (5 mg total) by mouth daily. Start taking on: Nov 08, 2023 What changed:  medication strength how much to take when to take this   metaxalone  800 MG tablet Commonly known as: SKELAXIN  Take 1 tablet (800 mg total) by mouth 3 (three) times daily.   methocarbamol  500 MG tablet Commonly known as: ROBAXIN  Take 1 tablet (500 mg total) by  mouth every 8 (eight) hours as needed. What changed: reasons to take this   OneTouch Delica Plus Lancet33G Misc USE AS INSTRUCTED TO CHECK BLOOD SUGAR 2 TIMES PER DAY   OneTouch Verio test strip Generic drug: glucose blood USE AS DIRECTED TO TEST BLOOD SUGAR TWICE A DAY   oxyCODONE  5 MG immediate release tablet Commonly known as: Oxy IR/ROXICODONE  Take 1 tablet (5 mg total) by mouth every 6 (six) hours as needed for moderate pain (pain score 4-6).   polyethylene glycol 17 g packet Commonly known as: MIRALAX  / GLYCOLAX  Take 17 g by mouth 2 (two) times daily as needed for mild constipation.   senna-docusate 8.6-50 MG tablet Commonly known as: Senokot-S Take 2 tablets by mouth 2 (two) times daily.   sitaGLIPtin  100 MG tablet Commonly known as: Januvia  Take 1 tablet (100 mg total) by mouth daily.        Follow-up Information     Orlie Bjornstad, MD Follow up on 12/04/2023.   Specialty: Infectious Diseases Why: Hospital Discharge Follow Up 10:45 am appointment Contact information: [address], [address] [place] [address] [phone]         Arlys Berke, MD Follow up in 1 week(s).   Specialty: Family Medicine Contact information: [address] [place] [address] [phone]                Allergies  Allergen Reactions   Blueberry [Vaccinium Angustifolium] Swelling and Other (See Comments)    Lips swell    The results of significant diagnostics from this hospitalization (including imaging, microbiology, ancillary and laboratory) are listed below for reference.    Microbiology: Recent Results (from the past 240 hours)  Surgical pcr screen     Status: None   Collection Time: 10/29/23 10:02 AM   Specimen: Nasal Mucosa; Nasal Swab  Result Value Ref Range Status   MRSA, PCR NEGATIVE NEGATIVE Final   Staphylococcus aureus NEGATIVE NEGATIVE Final    Comment: (NOTE) The Xpert SA Assay (FDA approved for NASAL specimens in patients  63 years of age and older), is one component of a comprehensive surveillance program. It is not intended to diagnose infection nor to guide or monitor treatment. Performed at [facility], [address], [place], [place] [address]   Culture, blood (Routine X 2) w Reflex to ID Panel     Status: None   Collection Time: 10/29/23 10:18 AM   Specimen: BLOOD RIGHT ARM  Result Value Ref Range Status   Specimen Description BLOOD RIGHT ARM  Final   Special Requests   Final    BOTTLES DRAWN AEROBIC AND ANAEROBIC Blood Culture adequate volume   Culture   Final    NO GROWTH 5 DAYS Performed at  [facility] Lab, [address], [place], [place] [address]    Report Status 11/03/2023 FINAL  Final  Culture, blood (Routine X 2) w Reflex to ID Panel     Status: Abnormal   Collection Time: 10/29/23 10:27 AM   Specimen: BLOOD LEFT ARM  Result Value Ref Range Status   Specimen Description BLOOD LEFT ARM  Final   Special Requests   Final    BOTTLES DRAWN AEROBIC AND ANAEROBIC BACTEROIDES CACCAE   Culture  Setup Time   Final    GRAM POSITIVE COCCI IN PAIRS IN CHAINS ANAEROBIC BOTTLE ONLY CRITICAL RESULT CALLED TO, READ BACK BY AND VERIFIED WITH: PHARMD J LEDFORD 10/30/2023 @ 0440 BY AB Performed at [facility], [address] [place], [place] [address]    Culture STREPTOCOCCUS PNEUMONIAE (A)  Final   Report Status 11/01/2023 FINAL  Final   Organism ID, Bacteria STREPTOCOCCUS PNEUMONIAE  Final      Susceptibility   Streptococcus pneumoniae - MIC*    ERYTHROMYCIN <=0.12 SENSITIVE Sensitive     LEVOFLOXACIN 1 SENSITIVE Sensitive     VANCOMYCIN  0.5 SENSITIVE Sensitive     PENICILLIN (meningitis) <=0.06 SENSITIVE Sensitive     PENO - penicillin <=0.06      PENICILLIN (non-meningitis) <=0.06 SENSITIVE Sensitive     PENICILLIN (oral) <=0.06 SENSITIVE Sensitive     CEFTRIAXONE  (non-meningitis) <=0.12 SENSITIVE Sensitive     CEFTRIAXONE  (meningitis) <=0.12 SENSITIVE Sensitive     *  STREPTOCOCCUS PNEUMONIAE  Blood Culture ID Panel (Reflexed)     Status: Abnormal   Collection Time: 10/29/23 10:27 AM  Result Value Ref Range Status   Enterococcus faecalis NOT DETECTED NOT DETECTED Final   Enterococcus Faecium NOT DETECTED NOT DETECTED Final   Listeria monocytogenes NOT DETECTED NOT DETECTED Final   Staphylococcus species NOT DETECTED NOT DETECTED Final   Staphylococcus aureus (BCID) NOT DETECTED NOT DETECTED Final   Staphylococcus epidermidis NOT DETECTED NOT DETECTED Final   Staphylococcus lugdunensis NOT DETECTED NOT DETECTED Final   Streptococcus species DETECTED (A) NOT DETECTED Final    Comment: CRITICAL RESULT CALLED TO, READ BACK BY AND VERIFIED WITH: PHARMD J LEDFORD 10/30/2023 @ 0440 BY AB    Streptococcus agalactiae NOT DETECTED NOT DETECTED Final   Streptococcus pneumoniae DETECTED (A) NOT DETECTED Final    Comment: CRITICAL RESULT CALLED TO, READ BACK BY AND VERIFIED WITH: PHARMD J LEDFORD 10/30/2023 @ 0440 BY AB    Streptococcus pyogenes NOT DETECTED NOT DETECTED Final   A.calcoaceticus-baumannii NOT DETECTED NOT DETECTED Final   Bacteroides fragilis NOT DETECTED NOT DETECTED Final   Enterobacterales NOT DETECTED NOT DETECTED Final   Enterobacter cloacae complex NOT DETECTED NOT DETECTED Final   Escherichia coli NOT DETECTED NOT DETECTED Final   Klebsiella aerogenes NOT DETECTED NOT DETECTED Final   Klebsiella oxytoca NOT DETECTED NOT DETECTED Final   Klebsiella pneumoniae NOT DETECTED NOT DETECTED Final   Proteus species NOT DETECTED NOT DETECTED Final   Salmonella species NOT DETECTED NOT DETECTED Final   Serratia marcescens NOT DETECTED NOT DETECTED Final   Haemophilus influenzae NOT DETECTED NOT DETECTED Final   Neisseria meningitidis NOT DETECTED NOT DETECTED Final   Pseudomonas aeruginosa NOT DETECTED NOT DETECTED Final   Stenotrophomonas maltophilia NOT DETECTED NOT DETECTED Final   Candida albicans NOT DETECTED NOT DETECTED Final   Candida  auris NOT DETECTED NOT DETECTED Final   Candida glabrata NOT DETECTED NOT DETECTED Final   Candida krusei NOT DETECTED NOT DETECTED Final  Candida parapsilosis NOT DETECTED NOT DETECTED Final   Candida tropicalis NOT DETECTED NOT DETECTED Final   Cryptococcus neoformans/gattii NOT DETECTED NOT DETECTED Final    Comment: Performed at [facility], [address], [place], [place] [address]  Aerobic/Anaerobic Culture w Gram Stain (surgical/deep wound)     Status: None   Collection Time: 10/29/23  1:13 PM   Specimen: PATH Soft tissue  Result Value Ref Range Status   Specimen Description ABSCESS  Final   Special Requests LUMBAR ABSCESS NO 1  Final   Gram Stain   Final    FEW WBC PRESENT, PREDOMINANTLY PMN RARE GRAM POSITIVE COCCI IN PAIRS    Culture   Final    FEW STREPTOCOCCUS PNEUMONIAE SUSCEPTIBILITIES PERFORMED ON PREVIOUS CULTURE WITHIN THE LAST 5 DAYS. NO ANAEROBES ISOLATED Performed at [facility], [address], [place], [place] [address]    Report Status 11/03/2023 FINAL  Final  Aerobic/Anaerobic Culture w Gram Stain (surgical/deep wound)     Status: None   Collection Time: 10/29/23  1:23 PM   Specimen: PATH Soft tissue  Result Value Ref Range Status   Specimen Description ABSCESS  Final   Special Requests LUMBAR ABSCESS NO 2  Final   Gram Stain   Final    ABUNDANT WBC PRESENT, PREDOMINANTLY PMN FEW GRAM POSITIVE COCCI IN PAIRS    Culture   Final    MODERATE STREPTOCOCCUS PNEUMONIAE NO ANAEROBES ISOLATED Performed at [facility], [address] [place], [place] [address]    Report Status 11/03/2023 FINAL  Final   Organism ID, Bacteria STREPTOCOCCUS PNEUMONIAE  Final      Susceptibility   Streptococcus pneumoniae - MIC*    ERYTHROMYCIN <=0.12 SENSITIVE Sensitive     LEVOFLOXACIN 1 SENSITIVE Sensitive     VANCOMYCIN  0.5 SENSITIVE Sensitive     PENICILLIN (meningitis) <=0.06 SENSITIVE Sensitive     PENO - penicillin <=0.06      PENICILLIN  (non-meningitis) <=0.06 SENSITIVE Sensitive     PENICILLIN (oral) <=0.06 SENSITIVE Sensitive     CEFTRIAXONE  (non-meningitis) <=0.12 SENSITIVE Sensitive     CEFTRIAXONE  (meningitis) <=0.12 SENSITIVE Sensitive     * MODERATE STREPTOCOCCUS PNEUMONIAE  Aerobic/Anaerobic Culture w Gram Stain (surgical/deep wound)     Status: None   Collection Time: 10/29/23  1:30 PM   Specimen: PATH Soft tissue  Result Value Ref Range Status   Specimen Description ABSCESS  Final   Special Requests LUMBAR ABSCESS NO 3  Final   Gram Stain   Final    RARE WBC PRESENT, PREDOMINANTLY PMN RARE GRAM POSITIVE COCCI IN PAIRS    Culture   Final    RARE STREPTOCOCCUS PNEUMONIAE SUSCEPTIBILITIES PERFORMED ON PREVIOUS CULTURE WITHIN THE LAST 5 DAYS. NO ANAEROBES ISOLATED Performed at [facility], [address] [place], [place] [address]    Report Status 11/03/2023 FINAL  Final  Culture, blood (Routine X 2) w Reflex to ID Panel     Status: None   Collection Time: 10/30/23  5:48 PM   Specimen: BLOOD LEFT ARM  Result Value Ref Range Status   Specimen Description BLOOD LEFT ARM  Final   Special Requests   Final    BOTTLES DRAWN AEROBIC AND ANAEROBIC Blood Culture adequate volume   Culture   Final    NO GROWTH 5 DAYS Performed at [facility], [address], [place], [place] [address]    Report Status 11/04/2023 FINAL  Final  Culture, blood (Routine X 2) w Reflex to ID Panel     Status: None   Collection Time: 10/30/23  6:07 PM   Specimen: BLOOD LEFT ARM  Result Value Ref Range Status   Specimen Description BLOOD LEFT ARM  Final   Special Requests   Final    AEROBIC BOTTLE ONLY Blood Culture results may not be optimal due to an inadequate volume of blood received in culture bottles   Culture   Final    NO GROWTH 5 DAYS Performed at [facility], [address], [place], [place] [address]    Report Status 11/04/2023 FINAL  Final    Procedures/Studies: US  EKG SITE RITE Result Date:  11/06/2023 If Site Rite image not attached, placement could not be confirmed due to current cardiac rhythm.  DG Orthopantogram Result Date: 11/02/2023 CLINICAL DATA:  Infection.  Jaw pain. EXAM: ORTHOPANTOGRAM/PANORAMIC COMPARISON:  None Available. FINDINGS: No fracture or lytic lesion is noted.  Poor dentition is noted. IMPRESSION: No acute abnormality seen. Electronically Signed   By: Rosalene Colon M.D.   On: 11/02/2023 13:41   ECHO TEE Result Date: 11/01/2023    TRANSESOPHOGEAL ECHO REPORT   Patient Name:   Kristina Long Date of Exam: 11/01/2023 Medical Rec #:  [id number]      Height:       70.0 in Accession #:    [id number]     Weight:       191.0 lb Date of Birth:  07-Aug-1955     BSA:          2.047 m Patient Age:    67 years       BP: Patient Gender: F              HR: Exam Location:  Inpatient Procedure: 2D Echo (Both Spectral and Color Flow Doppler were utilized during            procedure). Indications:    bacteremia  History:        Patient has prior history of Echocardiogram examinations.  Sonographer:    Hersey Lorenzo Referring Phys: [id number] HAO MENG PROCEDURE: The transesophogeal probe was passed without difficulty through the esophogus of the patient. Sedation performed by different physician. The patient's vital signs; including heart rate, blood pressure, and oxygen  saturation; remained stable throughout the procedure. The patient developed no complications during the procedure.  IMPRESSIONS  1. Left ventricular ejection fraction, by estimation, is 60 to 65%. The left ventricle has normal function. The left ventricle has no regional wall motion abnormalities.  2. Right ventricular systolic function is normal. The right ventricular size is normal.  3. No left atrial/left atrial appendage thrombus was detected.  4. There is a mobile mass ( 1.51 cm x 0.482 cm) on the anterior mitral valve leaflet. Then another mobile mass ( 0.509 cm x 0.532 cm) on th posterior mitral leaflet. The mitral valve is  abnormal. Trivial mitral valve regurgitation. No evidence of mitral  stenosis.  5. The aortic valve is normal in structure. There is moderate thickening of the aortic valve. Aortic valve regurgitation is mild. No aortic stenosis is present.  6. The inferior vena cava is normal in size with greater than 50% respiratory variability, suggesting right atrial pressure of 3 mmHg. Conclusion(s)/Recommendation(s): Findings are concerning for vegetation/infective endocarditis as detailed above. FINDINGS  Left Ventricle: Left ventricular ejection fraction, by estimation, is 60 to 65%. The left ventricle has normal function. The left ventricle has no regional  wall motion abnormalities. The left ventricular internal cavity size was normal in size. There is  no left ventricular hypertrophy. Right Ventricle: The right ventricular size is normal. No increase in right ventricular wall thickness. Right ventricular systolic function is normal. Left Atrium: Left atrial size was normal in size. No left atrial/left atrial appendage thrombus was detected. Right Atrium: Right atrial size was normal in size. Pericardium: There is no evidence of pericardial effusion. Mitral Valve: There is a mobile mass ( 1.51 cm x 0.482 cm) on the anterior mitral valve leaflet. Then another mobile mass ( 0.509 cm x 0.532 cm) on th posterior mitral leaflet. The mitral valve is abnormal. Trivial mitral valve regurgitation. No evidence  of mitral valve stenosis. Tricuspid Valve: The tricuspid valve is normal in structure. Tricuspid valve regurgitation is not demonstrated. No evidence of tricuspid stenosis. Aortic Valve: The aortic valve is normal in structure. There is moderate thickening of the aortic valve. Aortic valve regurgitation is mild. No aortic stenosis is present. Pulmonic Valve: The pulmonic valve was normal in structure. Pulmonic valve regurgitation is not visualized. No evidence of pulmonic stenosis. Aorta: The aortic root is normal in size and  structure. Pulmonary Artery: The pulmonary artery is not well seen. Venous: The left upper pulmonary vein, left lower pulmonary vein, right upper pulmonary vein and right lower pulmonary vein are normal. A normal flow pattern is recorded from the right upper pulmonary vein. The inferior vena cava is normal in size with greater than 50% respiratory variability, suggesting right atrial pressure of 3 mmHg. IAS/Shunts: No atrial level shunt detected by color flow Doppler.   AORTA Ao Asc diam: 2.80 cm Chyrl Crawford Tobb DO Electronically signed by Jerryl Morin DO Signature Date/Time: 11/01/2023/4:24:08 PM    Final    EP STUDY Result Date: 11/01/2023 See surgical note for result.  ECHOCARDIOGRAM COMPLETE Result Date: 10/31/2023    ECHOCARDIOGRAM REPORT   Patient Name:   Kristina Long Date of Exam: 10/31/2023 Medical Rec #:  [id number]      Height:       70.0 in Accession #:    [id number]     Weight:       87.1 lb Date of Birth:  03-14-1956     BSA:          1.466 m Patient Age:    67 years       BP:           143/73 mmHg Patient Gender: F              HR:           103 bpm. Exam Location:  Inpatient Procedure: 2D Echo, Cardiac Doppler and Color Doppler (Both Spectral and Color            Flow Doppler were utilized during procedure). Indications:    Bacteremia  History:        Patient has no prior history of Echocardiogram examinations.  Sonographer:    Andrena Bang Referring Phys: Bubba Carbo  Sonographer Comments: Difficult study due to suboptimal positioning. Pt experienced severe back pain during exam. IMPRESSIONS  1. Left ventricular ejection fraction, by estimation, is 55 to 60%. The left ventricle has normal function. The left ventricle has no regional wall motion abnormalities. Left ventricular diastolic parameters were normal.  2. Right ventricular systolic function is normal. The right ventricular size is normal.  3. Left atrial size was mildly dilated.  4. The mitral valve is normal in structure. No  evidence of  mitral valve regurgitation. No evidence of mitral stenosis.  5. The aortic valve is tricuspid. Aortic valve regurgitation is not visualized. No aortic stenosis is present.  6. The inferior vena cava is normal in size with greater than 50% respiratory variability, suggesting right atrial pressure of 3 mmHg. Conclusion(s)/Recommendation(s): No evidence of valvular vegetations on this transthoracic echocardiogram. Consider a transesophageal echocardiogram to exclude infective endocarditis if clinically indicated. FINDINGS  Left Ventricle: Left ventricular ejection fraction, by estimation, is 55 to 60%. The left ventricle has normal function. The left ventricle has no regional wall motion abnormalities. The left ventricular internal cavity size was normal in size. There is  no left ventricular hypertrophy. Left ventricular diastolic parameters were normal. Right Ventricle: The right ventricular size is normal. No increase in right ventricular wall thickness. Right ventricular systolic function is normal. Left Atrium: Left atrial size was mildly dilated. Right Atrium: Right atrial size was normal in size. Pericardium: There is no evidence of pericardial effusion. Mitral Valve: The mitral valve is normal in structure. No evidence of mitral valve regurgitation. No evidence of mitral valve stenosis. Tricuspid Valve: The tricuspid valve is normal in structure. Tricuspid valve regurgitation is not demonstrated. No evidence of tricuspid stenosis. Aortic Valve: The aortic valve is tricuspid. Aortic valve regurgitation is not visualized. No aortic stenosis is present. Aortic valve mean gradient measures 4.0 mmHg. Aortic valve peak gradient measures 9.0 mmHg. Aortic valve area, by VTI measures 2.99 cm. Pulmonic Valve: The pulmonic valve was normal in structure. Pulmonic valve regurgitation is not visualized. No evidence of pulmonic stenosis. Aorta: The aortic root is normal in size and structure. Venous: The inferior vena cava  is normal in size with greater than 50% respiratory variability, suggesting right atrial pressure of 3 mmHg. IAS/Shunts: No atrial level shunt detected by color flow Doppler.  LEFT VENTRICLE PLAX 2D LVIDd:         4.80 cm      Diastology LVIDs:         3.50 cm      LV e' lateral:   12.30 cm/s LV PW:         1.00 cm      LV E/e' lateral: 7.3 LV IVS:        0.80 cm LVOT diam:     2.10 cm LV SV:         74 LV SV Index:   51 LVOT Area:     3.46 cm  LV Volumes (MOD) LV vol d, MOD A2C: 78.6 ml LV vol d, MOD A4C: 120.0 ml LV vol s, MOD A2C: 22.5 ml LV vol s, MOD A4C: 71.9 ml LV SV MOD A2C:     56.1 ml LV SV MOD A4C:     120.0 ml LV SV MOD BP:      57.2 ml RIGHT VENTRICLE RV S prime:     20.50 cm/s TAPSE (M-mode): 1.7 cm LEFT ATRIUM             Index LA Vol (A2C):   35.3 ml 24.08 ml/m LA Vol (A4C):   55.9 ml 38.13 ml/m LA Biplane Vol: 46.5 ml 31.72 ml/m  AORTIC VALVE AV Area (Vmax):    2.52 cm AV Area (Vmean):   2.25 cm AV Area (VTI):     2.99 cm AV Vmax:           150.00 cm/s AV Vmean:          97.400 cm/s AV VTI:  0.249 m AV Peak Grad:      9.0 mmHg AV Mean Grad:      4.0 mmHg LVOT Vmax:         109.00 cm/s LVOT Vmean:        63.300 cm/s LVOT VTI:          0.215 m LVOT/AV VTI ratio: 0.86  AORTA Ao Asc diam: 3.00 cm MITRAL VALVE MV Area (PHT): 4.99 cm    SHUNTS MV Decel Time: 152 msec    Systemic VTI:  0.22 m MV E velocity: 89.60 cm/s  Systemic Diam: 2.10 cm MV A velocity: 73.40 cm/s MV E/A ratio:  1.22 Dorothye Gathers MD Electronically signed by Dorothye Gathers MD Signature Date/Time: 10/31/2023/5:08:32 PM    Final    DG Lumbar Spine 2-3 Views Result Date: 10/29/2023 CLINICAL DATA:  Elective surgery. EXAM: LUMBAR SPINE - 2-3 VIEW COMPARISON:  Preoperative imaging FINDINGS: Two lateral spot views of the lumbar spine submitted from the operating room. Film 1 demonstrates surgical instrument projecting posteriorly at the L1 level. Film 2 demonstrates surgical instruments projecting posteriorly at the L1 and upper  L2 levels. IMPRESSION: Intraoperative localization during lumbar spine surgery. Electronically Signed   By: Chadwick Colonel M.D.   On: 10/29/2023 15:48   MR THORACIC SPINE WO CONTRAST Result Date: 10/29/2023 CLINICAL DATA:  Mid-back pain, spondyloarthropathy suspected, xray done; Low back pain, spondyloarthropathy suspected, xray done. EXAM: MRI THORACIC AND LUMBAR SPINE WITHOUT CONTRAST TECHNIQUE: Multiplanar and multiecho pulse sequences of the thoracic and lumbar spine were obtained without intravenous contrast. COMPARISON:  None Available. FINDINGS: MRI THORACIC SPINE FINDINGS Alignment:  No substantial sagittal subluxation. Vertebrae: No fracture, evidence of discitis, or bone lesion. Cord:  Normal cord signal. Paraspinal and other soft tissues: At the thoracolumbar junction there is right eccentric paraspinal edema which appears heterogeneous. Disc levels: Heterogeneous posterior epidural collection which extends from T11-T12 inferiorly into the lumbar spine. Resulting severe canal stenosis at T12-L1 where the collection measures up to 1 cm in thickness. MRI LUMBAR SPINE FINDINGS Segmentation:  Standard. Alignment:  Grade 1 anterolisthesis of L4 on L5. Vertebrae:  No fracture, evidence of discitis, or bone lesion. Conus medullaris and cauda equina: Conus extends to the level. Conus and cauda equina appear normal. Paraspinal and other soft tissues: At the thoracolumbar junction there is right eccentric paraspinal edema which appears heterogeneous. Disc levels: The heterogeneous posterior epidural collection extending from T11-T12 to L4-L5 which measures up to 1.4 cm in thickness at L1-L2. Resulting severe canal stenosis at L1-L2 and L2-L3. The collection tapers inferiorly with mild to moderate canal stenosis at L3-L4. Superimposed degenerative changes including disc bulging at L2-L3 and facet arthropathy at L4-L5 where there is moderate bilateral foraminal stenosis and grade 1 anterolisthesis. IMPRESSION: 1.  Heterogeneous posterior epidural collection extending from T11-T12 to L4-L5, measuring up to 1.4 cm in thickness at L1-L2. Resulting severe canal stenosis at T12-L1, L1-L2 and L2-L3. Differential considerations include epidural hematoma versus abscess. No findings to suggest discitis/osteomyelitis. 2. Heterogeneous right eccentric paraspinal edema at the thoracolumbar junction, which may represent hematoma/contusion or infection. 3. Given the above, recommend neurosurgical consultation and correlation with any history of trauma or evidence of infection. Postcontrast imaging may be helpful to further characterize. 4. Superimposed degenerative changes including disc bulging at L2-L3 and facet arthropathy at L4-L5 where there is moderate bilateral foraminal stenosis and grade 1 anterolisthesis. These results will be called to the ordering clinician or representative by the Radiologist Assistant, and communication documented in  the PACS or [employer]. Electronically Signed   By: Stevenson Elbe M.D.   On: 10/29/2023 01:52   MR LUMBAR SPINE WO CONTRAST Result Date: 10/29/2023 CLINICAL DATA:  Mid-back pain, spondyloarthropathy suspected, xray done; Low back pain, spondyloarthropathy suspected, xray done. EXAM: MRI THORACIC AND LUMBAR SPINE WITHOUT CONTRAST TECHNIQUE: Multiplanar and multiecho pulse sequences of the thoracic and lumbar spine were obtained without intravenous contrast. COMPARISON:  None Available. FINDINGS: MRI THORACIC SPINE FINDINGS Alignment:  No substantial sagittal subluxation. Vertebrae: No fracture, evidence of discitis, or bone lesion. Cord:  Normal cord signal. Paraspinal and other soft tissues: At the thoracolumbar junction there is right eccentric paraspinal edema which appears heterogeneous. Disc levels: Heterogeneous posterior epidural collection which extends from T11-T12 inferiorly into the lumbar spine. Resulting severe canal stenosis at T12-L1 where the collection measures up to 1  cm in thickness. MRI LUMBAR SPINE FINDINGS Segmentation:  Standard. Alignment:  Grade 1 anterolisthesis of L4 on L5. Vertebrae:  No fracture, evidence of discitis, or bone lesion. Conus medullaris and cauda equina: Conus extends to the level. Conus and cauda equina appear normal. Paraspinal and other soft tissues: At the thoracolumbar junction there is right eccentric paraspinal edema which appears heterogeneous. Disc levels: The heterogeneous posterior epidural collection extending from T11-T12 to L4-L5 which measures up to 1.4 cm in thickness at L1-L2. Resulting severe canal stenosis at L1-L2 and L2-L3. The collection tapers inferiorly with mild to moderate canal stenosis at L3-L4. Superimposed degenerative changes including disc bulging at L2-L3 and facet arthropathy at L4-L5 where there is moderate bilateral foraminal stenosis and grade 1 anterolisthesis. IMPRESSION: 1. Heterogeneous posterior epidural collection extending from T11-T12 to L4-L5, measuring up to 1.4 cm in thickness at L1-L2. Resulting severe canal stenosis at T12-L1, L1-L2 and L2-L3. Differential considerations include epidural hematoma versus abscess. No findings to suggest discitis/osteomyelitis. 2. Heterogeneous right eccentric paraspinal edema at the thoracolumbar junction, which may represent hematoma/contusion or infection. 3. Given the above, recommend neurosurgical consultation and correlation with any history of trauma or evidence of infection. Postcontrast imaging may be helpful to further characterize. 4. Superimposed degenerative changes including disc bulging at L2-L3 and facet arthropathy at L4-L5 where there is moderate bilateral foraminal stenosis and grade 1 anterolisthesis. These results will be called to the ordering clinician or representative by the Radiologist Assistant, and communication documented in the PACS or [employer]. Electronically Signed   By: Stevenson Elbe M.D.   On: 10/29/2023 01:52   DG Lumbar Spine  1 View Result Date: 10/27/2023 CLINICAL DATA:  1 week of low back pain.  No fall. EXAM: LUMBAR SPINE - 1 VIEW; PELVIS - 1-2 VIEW COMPARISON:  10/24/2023 CT FINDINGS: Pelvis: Single AP view of the pelvis demonstrates no fracture or dislocation. Hip joint spaces are maintained for age. Sacroiliac joints are symmetric. Left central pelvic calcification is secondary to a dystrophic fibroid when correlated with prior CT. Lumbar spine: Single AP view of the lumbar spine demonstrates grossly maintained vertebral body height. Spondylosis at L4-5 and L5-S1. IMPRESSION: No acute osseous abnormality. Limited evaluation lumbar spine secondary to lack of lateral view. Electronically Signed   By: Lore Rode M.D.   On: 10/27/2023 16:21   DG Pelvis 1-2 Views Result Date: 10/27/2023 CLINICAL DATA:  1 week of low back pain.  No fall. EXAM: LUMBAR SPINE - 1 VIEW; PELVIS - 1-2 VIEW COMPARISON:  10/24/2023 CT FINDINGS: Pelvis: Single AP view of the pelvis demonstrates no fracture or dislocation. Hip joint spaces are maintained for  age. Sacroiliac joints are symmetric. Left central pelvic calcification is secondary to a dystrophic fibroid when correlated with prior CT. Lumbar spine: Single AP view of the lumbar spine demonstrates grossly maintained vertebral body height. Spondylosis at L4-5 and L5-S1. IMPRESSION: No acute osseous abnormality. Limited evaluation lumbar spine secondary to lack of lateral view. Electronically Signed   By: Lore Rode M.D.   On: 10/27/2023 16:21   CT Renal Stone Study Result Date: 10/24/2023 CLINICAL DATA:  Abdominal/flank pain.  Concern for kidney stone. EXAM: CT ABDOMEN AND PELVIS WITHOUT CONTRAST TECHNIQUE: Multidetector CT imaging of the abdomen and pelvis was performed following the standard protocol without IV contrast. RADIATION DOSE REDUCTION: This exam was performed according to the departmental dose-optimization program which includes automated exposure control, adjustment of the mA and/or  kV according to patient size and/or use of iterative reconstruction technique. COMPARISON:  CT abdomen pelvis dated 10/22/2023. FINDINGS: Evaluation of this exam is limited in the absence of intravenous contrast. Lower chest: The visualized lung bases are clear. No intra-abdominal free air or free fluid. Hepatobiliary: Fatty liver. No biliary dilatation. The gallbladder is unremarkable. Pancreas: Unremarkable. No pancreatic ductal dilatation or surrounding inflammatory changes. Spleen: Normal in size without focal abnormality. Adrenals/Urinary Tract: The adrenal glands unremarkable. Subcentimeter hypodense lesion in the interpolar left kidney is too small to characterize but may represent a cyst or an angiomyolipoma. There is no hydronephrosis or nephrolithiasis on either side. The visualized ureters and urinary bladder appear unremarkable. Stomach/Bowel: There is no bowel obstruction or active inflammation. The appendix is normal. Vascular/Lymphatic: The abdominal aorta and IVC unremarkable. No portal venous gas. There is no adenopathy. Reproductive: Calcified posterior uterine fibroid. No suspicious adnexal masses. Other: Small fat containing umbilical hernia. Musculoskeletal: Grade 1 L4-L5 anterolisthesis. No acute osseous pathology. IMPRESSION: 1. No acute intra-abdominal or pelvic pathology. No hydronephrosis or nephrolithiasis. 2. Fatty liver. 3. No bowel obstruction. Normal appendix. Electronically Signed   By: Angus Bark M.D.   On: 10/24/2023 18:22   DG Chest Port 1 View Result Date: 10/24/2023 CLINICAL DATA:  Questionable sepsis - evaluate for abnormality EXAM: PORTABLE CHEST - 1 VIEW COMPARISON:  None available. FINDINGS: Streaky bibasilar atelectasis. No focal airspace consolidation, pleural effusion, or pneumothorax. No cardiomegaly. No acute fracture or destructive lesion. Multilevel thoracic osteophytosis. IMPRESSION: No acute cardiopulmonary abnormality. Electronically Signed   By: Rance Burrows M.D.   On: 10/24/2023 15:29   CT ABDOMEN PELVIS W CONTRAST Result Date: 10/22/2023 CLINICAL DATA:  Right lower quadrant and flank pain for several days. EXAM: CT ABDOMEN AND PELVIS WITH CONTRAST TECHNIQUE: Multidetector CT imaging of the abdomen and pelvis was performed using the standard protocol following bolus administration of intravenous contrast. RADIATION DOSE REDUCTION: This exam was performed according to the departmental dose-optimization program which includes automated exposure control, adjustment of the mA and/or kV according to patient size and/or use of iterative reconstruction technique. CONTRAST:  OMNIPAQUE  IOHEXOL  300 MG/ML  SOLN COMPARISON:  08/29/2020 FINDINGS: Lower Chest: No acute findings. Hepatobiliary: No suspicious hepatic masses identified. Mild steatosis again noted. Gallbladder is unremarkable. No evidence of biliary ductal dilatation. Pancreas:  No mass or inflammatory changes. Spleen: Within normal limits in size and appearance. Adrenals/Urinary Tract: Stable sub-centimeter fat attenuation lesion in midpole of left kidney, consistent with benign angiomyolipoma (No followup imaging is recommended) . No evidence of ureteral calculi or hydronephrosis. Stomach/Bowel: No evidence of obstruction, inflammatory process or abnormal fluid collections. Normal appendix visualized. Vascular/Lymphatic: No pathologically enlarged lymph nodes. No acute  vascular findings. Reproductive: Several small uterine fibroids are again seen, some of which are calcified, largest measuring 2.2 cm. Adnexal regions are unremarkable. Other:  Stable small fat-containing umbilical hernia. Musculoskeletal:  No suspicious bone lesions identified. IMPRESSION: No evidence of appendicitis, hydronephrosis, or other acute findings. Stable mild hepatic steatosis Stable small uterine fibroids. Stable small fat-containing umbilical hernia. Electronically Signed   By: Marlyce Sine M.D.   On: 10/22/2023 08:53     Labs: BNP (last 3 results) No results for input(s): "BNP" in the last 8760 hours. Basic Metabolic Panel: Recent Labs  Lab 11/01/23 1053 11/02/23 0609 11/03/23 0550 11/05/23 0650 11/06/23 0622 11/07/23 0314  NA 136 136 136 138 137 136  K 3.8 4.2 4.0 4.4 4.4 4.2  CL 97* 98 100 99 101 101  CO2 28 25 29 29 26 24   GLUCOSE 182* 226* 179* 150* 169* 160*  BUN 15 16 12 17 19  25*  CREATININE 0.57 0.74 0.60 0.70 0.81 0.84  CALCIUM  8.9 8.7* 8.5* 9.2 9.0 9.4  MG 2.1 2.0 2.0 2.2  --   --   PHOS 2.3* 3.7 3.0 4.0  --   --    Liver Function Tests: Recent Labs  Lab 11/01/23 1053 11/02/23 0609 11/03/23 0550 11/05/23 0650  ALBUMIN 2.3* 2.0* 2.1* 2.4*   No results for input(s): "LIPASE", "AMYLASE" in the last 168 hours. No results for input(s): "AMMONIA" in the last 168 hours. CBC: Recent Labs  Lab 11/02/23 0609 11/03/23 0550 11/05/23 0650 11/06/23 0921 11/07/23 0314  WBC 13.0* 11.0* 10.8* 13.1* 10.9*  NEUTROABS  --  6.8 6.8  --   --   HGB 9.3* 9.4* 9.8* 9.9* 9.2*  HCT 29.2* 28.9* 30.7* 31.3* 29.5*  MCV 84.6 84.3 85.8 86.2 87.0  PLT 315 372 433* 469* 447*   Recent Labs  Lab 11/06/23 0645 11/06/23 1125 11/06/23 1644 11/06/23 2200 11/07/23 0630  GLUCAP 165* 237* 143* 114* 135*   No results for input(s): "VITAMINB12", "FOLATE", "FERRITIN", "TIBC", "IRON", "RETICCTPCT" in the last 72 hours. Urinalysis    Component Value Date/Time   COLORURINE YELLOW 10/26/2023 2027   APPEARANCEUR CLEAR 10/26/2023 2027   LABSPEC 1.031 (H) 10/26/2023 2027   PHURINE 5.0 10/26/2023 2027   GLUCOSEU >=500 (A) 10/26/2023 2027   HGBUR MODERATE (A) 10/26/2023 2027   BILIRUBINUR NEGATIVE 10/26/2023 2027   KETONESUR 20 (A) 10/26/2023 2027   PROTEINUR >=300 (A) 10/26/2023 2027   NITRITE NEGATIVE 10/26/2023 2027   LEUKOCYTESUR NEGATIVE 10/26/2023 2027   Sepsis Labs Recent Labs  Lab 11/03/23 0550 11/05/23 0650 11/06/23 0921 11/07/23 0314  WBC 11.0* 10.8* 13.1* 10.9*    Microbiology Recent Results (from the past 240 hours)  Surgical pcr screen     Status: None   Collection Time: 10/29/23 10:02 AM   Specimen: Nasal Mucosa; Nasal Swab  Result Value Ref Range Status   MRSA, PCR NEGATIVE NEGATIVE Final   Staphylococcus aureus NEGATIVE NEGATIVE Final    Comment: (NOTE) The Xpert SA Assay (FDA approved for NASAL specimens in patients 27 years of age and older), is one component of a comprehensive surveillance program. It is not intended to diagnose infection nor to guide or monitor treatment. Performed at [facility], [address] [place], [place] [address]   Culture, blood (Routine X 2) w Reflex to ID Panel     Status: None   Collection Time: 10/29/23 10:18 AM   Specimen: BLOOD RIGHT ARM  Result Value Ref Range Status   Specimen  Description BLOOD RIGHT ARM  Final   Special Requests   Final    BOTTLES DRAWN AEROBIC AND ANAEROBIC Blood Culture adequate volume   Culture   Final    NO GROWTH 5 DAYS Performed at [facility], [address], [place], [place] [address]    Report Status 11/03/2023 FINAL  Final  Culture, blood (Routine X 2) w Reflex to ID Panel     Status: Abnormal   Collection Time: 10/29/23 10:27 AM   Specimen: BLOOD LEFT ARM  Result Value Ref Range Status   Specimen Description BLOOD LEFT ARM  Final   Special Requests   Final    BOTTLES DRAWN AEROBIC AND ANAEROBIC BACTEROIDES CACCAE   Culture  Setup Time   Final    GRAM POSITIVE COCCI IN PAIRS IN CHAINS ANAEROBIC BOTTLE ONLY CRITICAL RESULT CALLED TO, READ BACK BY AND VERIFIED WITH: PHARMD J LEDFORD 10/30/2023 @ 0440 BY AB Performed at [facility], [address] [place], [place] [address]    Culture STREPTOCOCCUS PNEUMONIAE (A)  Final   Report Status 11/01/2023 FINAL  Final   Organism ID, Bacteria STREPTOCOCCUS PNEUMONIAE  Final      Susceptibility   Streptococcus pneumoniae - MIC*    ERYTHROMYCIN <=0.12 SENSITIVE Sensitive     LEVOFLOXACIN 1  SENSITIVE Sensitive     VANCOMYCIN  0.5 SENSITIVE Sensitive     PENICILLIN (meningitis) <=0.06 SENSITIVE Sensitive     PENO - penicillin <=0.06      PENICILLIN (non-meningitis) <=0.06 SENSITIVE Sensitive     PENICILLIN (oral) <=0.06 SENSITIVE Sensitive     CEFTRIAXONE  (non-meningitis) <=0.12 SENSITIVE Sensitive     CEFTRIAXONE  (meningitis) <=0.12 SENSITIVE Sensitive     * STREPTOCOCCUS PNEUMONIAE  Blood Culture ID Panel (Reflexed)     Status: Abnormal   Collection Time: 10/29/23 10:27 AM  Result Value Ref Range Status   Enterococcus faecalis NOT DETECTED NOT DETECTED Final   Enterococcus Faecium NOT DETECTED NOT DETECTED Final   Listeria monocytogenes NOT DETECTED NOT DETECTED Final   Staphylococcus species NOT DETECTED NOT DETECTED Final   Staphylococcus aureus (BCID) NOT DETECTED NOT DETECTED Final   Staphylococcus epidermidis NOT DETECTED NOT DETECTED Final   Staphylococcus lugdunensis NOT DETECTED NOT DETECTED Final   Streptococcus species DETECTED (A) NOT DETECTED Final    Comment: CRITICAL RESULT CALLED TO, READ BACK BY AND VERIFIED WITH: PHARMD J LEDFORD 10/30/2023 @ 0440 BY AB    Streptococcus agalactiae NOT DETECTED NOT DETECTED Final   Streptococcus pneumoniae DETECTED (A) NOT DETECTED Final    Comment: CRITICAL RESULT CALLED TO, READ BACK BY AND VERIFIED WITH: PHARMD J LEDFORD 10/30/2023 @ 0440 BY AB    Streptococcus pyogenes NOT DETECTED NOT DETECTED Final   A.calcoaceticus-baumannii NOT DETECTED NOT DETECTED Final   Bacteroides fragilis NOT DETECTED NOT DETECTED Final   Enterobacterales NOT DETECTED NOT DETECTED Final   Enterobacter cloacae complex NOT DETECTED NOT DETECTED Final   Escherichia coli NOT DETECTED NOT DETECTED Final   Klebsiella aerogenes NOT DETECTED NOT DETECTED Final   Klebsiella oxytoca NOT DETECTED NOT DETECTED Final   Klebsiella pneumoniae NOT DETECTED NOT DETECTED Final   Proteus species NOT DETECTED NOT DETECTED Final   Salmonella species NOT  DETECTED NOT DETECTED Final   Serratia marcescens NOT DETECTED NOT DETECTED Final   Haemophilus influenzae NOT DETECTED NOT DETECTED Final   Neisseria meningitidis NOT DETECTED NOT DETECTED Final   Pseudomonas aeruginosa NOT DETECTED NOT DETECTED Final   Stenotrophomonas maltophilia NOT  DETECTED NOT DETECTED Final   Candida albicans NOT DETECTED NOT DETECTED Final   Candida auris NOT DETECTED NOT DETECTED Final   Candida glabrata NOT DETECTED NOT DETECTED Final   Candida krusei NOT DETECTED NOT DETECTED Final   Candida parapsilosis NOT DETECTED NOT DETECTED Final   Candida tropicalis NOT DETECTED NOT DETECTED Final   Cryptococcus neoformans/gattii NOT DETECTED NOT DETECTED Final    Comment: Performed at [facility], [address] [place], [place] [address]  Aerobic/Anaerobic Culture w Gram Stain (surgical/deep wound)     Status: None   Collection Time: 10/29/23  1:13 PM   Specimen: PATH Soft tissue  Result Value Ref Range Status   Specimen Description ABSCESS  Final   Special Requests LUMBAR ABSCESS NO 1  Final   Gram Stain   Final    FEW WBC PRESENT, PREDOMINANTLY PMN RARE GRAM POSITIVE COCCI IN PAIRS    Culture   Final    FEW STREPTOCOCCUS PNEUMONIAE SUSCEPTIBILITIES PERFORMED ON PREVIOUS CULTURE WITHIN THE LAST 5 DAYS. NO ANAEROBES ISOLATED Performed at [facility], [address] [place], [place] [address]    Report Status 11/03/2023 FINAL  Final  Aerobic/Anaerobic Culture w Gram Stain (surgical/deep wound)     Status: None   Collection Time: 10/29/23  1:23 PM   Specimen: PATH Soft tissue  Result Value Ref Range Status   Specimen Description ABSCESS  Final   Special Requests LUMBAR ABSCESS NO 2  Final   Gram Stain   Final    ABUNDANT WBC PRESENT, PREDOMINANTLY PMN FEW GRAM POSITIVE COCCI IN PAIRS    Culture   Final    MODERATE STREPTOCOCCUS PNEUMONIAE NO ANAEROBES ISOLATED Performed at [facility], [address] [place], [place] [address]     Report Status 11/03/2023 FINAL  Final   Organism ID, Bacteria STREPTOCOCCUS PNEUMONIAE  Final      Susceptibility   Streptococcus pneumoniae - MIC*    ERYTHROMYCIN <=0.12 SENSITIVE Sensitive     LEVOFLOXACIN 1 SENSITIVE Sensitive     VANCOMYCIN  0.5 SENSITIVE Sensitive     PENICILLIN (meningitis) <=0.06 SENSITIVE Sensitive     PENO - penicillin <=0.06      PENICILLIN (non-meningitis) <=0.06 SENSITIVE Sensitive     PENICILLIN (oral) <=0.06 SENSITIVE Sensitive     CEFTRIAXONE  (non-meningitis) <=0.12 SENSITIVE Sensitive     CEFTRIAXONE  (meningitis) <=0.12 SENSITIVE Sensitive     * MODERATE STREPTOCOCCUS PNEUMONIAE  Aerobic/Anaerobic Culture w Gram Stain (surgical/deep wound)     Status: None   Collection Time: 10/29/23  1:30 PM   Specimen: PATH Soft tissue  Result Value Ref Range Status   Specimen Description ABSCESS  Final   Special Requests LUMBAR ABSCESS NO 3  Final   Gram Stain   Final    RARE WBC PRESENT, PREDOMINANTLY PMN RARE GRAM POSITIVE COCCI IN PAIRS    Culture   Final    RARE STREPTOCOCCUS PNEUMONIAE SUSCEPTIBILITIES PERFORMED ON PREVIOUS CULTURE WITHIN THE LAST 5 DAYS. NO ANAEROBES ISOLATED Performed at [facility], [address], [place], [place] [address]    Report Status 11/03/2023 FINAL  Final  Culture, blood (Routine X 2) w Reflex to ID Panel     Status: None   Collection Time: 10/30/23  5:48 PM   Specimen: BLOOD LEFT ARM  Result Value Ref Range Status   Specimen Description BLOOD LEFT ARM  Final   Special Requests   Final    BOTTLES DRAWN AEROBIC  AND ANAEROBIC Blood Culture adequate volume   Culture   Final    NO GROWTH 5 DAYS Performed at [facility], [address], [place], [place] [address]    Report Status 11/04/2023 FINAL  Final  Culture, blood (Routine X 2) w Reflex to ID Panel     Status: None   Collection Time: 10/30/23  6:07 PM   Specimen: BLOOD LEFT ARM  Result Value Ref Range Status   Specimen Description BLOOD LEFT ARM  Final    Special Requests   Final    AEROBIC BOTTLE ONLY Blood Culture results may not be optimal due to an inadequate volume of blood received in culture bottles   Culture   Final    NO GROWTH 5 DAYS Performed at [facility], [address] [place], [place] [address]    Report Status 11/04/2023 FINAL  Final     Time coordinating discharge: 35 minutes  SIGNED: Lesa Rape, MD  [facility] 11/07/2023, 10:15 AM  If 7PM-7AM, please contact night-coverage [email or web address]

## 2023-11-07 NOTE — Plan of Care (Signed)
 Problem: Education: Goal: Ability to describe self-care measures that may prevent or decrease complications (Diabetes Survival Skills Education) will improve Outcome: Adequate for Discharge Goal: Individualized Educational Video(s) Outcome: Adequate for Discharge   Problem: Coping: Goal: Ability to adjust to condition or change in health will improve Outcome: Adequate for Discharge   Problem: Fluid Volume: Goal: Ability to maintain a balanced intake and output will improve Outcome: Adequate for Discharge   Problem: Health Behavior/Discharge Planning: Goal: Ability to identify and utilize available resources and services will improve Outcome: Adequate for Discharge Goal: Ability to manage health-related needs will improve Outcome: Adequate for Discharge   Problem: Metabolic: Goal: Ability to maintain appropriate glucose levels will improve Outcome: Adequate for Discharge   Problem: Nutritional: Goal: Maintenance of adequate nutrition will improve Outcome: Adequate for Discharge Goal: Progress toward achieving an optimal weight will improve Outcome: Adequate for Discharge   Problem: Skin Integrity: Goal: Risk for impaired skin integrity will decrease Outcome: Adequate for Discharge   Problem: Tissue Perfusion: Goal: Adequacy of tissue perfusion will improve Outcome: Adequate for Discharge   Problem: Education: Goal: Knowledge of General Education information will improve Description: Including pain rating scale, medication(s)/side effects and non-pharmacologic comfort measures Outcome: Adequate for Discharge   Problem: Health Behavior/Discharge Planning: Goal: Ability to manage health-related needs will improve Outcome: Adequate for Discharge   Problem: Clinical Measurements: Goal: Ability to maintain clinical measurements within normal limits will improve Outcome: Adequate for Discharge Goal: Will remain free from infection Outcome: Adequate for Discharge Goal:  Diagnostic test results will improve Outcome: Adequate for Discharge Goal: Respiratory complications will improve Outcome: Adequate for Discharge Goal: Cardiovascular complication will be avoided Outcome: Adequate for Discharge   Problem: Activity: Goal: Risk for activity intolerance will decrease Outcome: Adequate for Discharge   Problem: Nutrition: Goal: Adequate nutrition will be maintained Outcome: Adequate for Discharge   Problem: Coping: Goal: Level of anxiety will decrease Outcome: Adequate for Discharge   Problem: Elimination: Goal: Will not experience complications related to bowel motility Outcome: Adequate for Discharge Goal: Will not experience complications related to urinary retention Outcome: Adequate for Discharge   Problem: Pain Managment: Goal: General experience of comfort will improve and/or be controlled Outcome: Adequate for Discharge   Problem: Safety: Goal: Ability to remain free from injury will improve Outcome: Adequate for Discharge   Problem: Skin Integrity: Goal: Risk for impaired skin integrity will decrease Outcome: Adequate for Discharge   Problem: Acute Rehab PT Goals(only PT should resolve) Goal: Pt Will Go Supine/Side To Sit Outcome: Adequate for Discharge Goal: Patient Will Transfer Sit To/From Stand Outcome: Adequate for Discharge Goal: Pt Will Ambulate Outcome: Adequate for Discharge   Problem: Education: Goal: Ability to verbalize activity precautions or restrictions will improve Outcome: Adequate for Discharge Goal: Knowledge of the prescribed therapeutic regimen will improve Outcome: Adequate for Discharge Goal: Understanding of discharge needs will improve Outcome: Adequate for Discharge   Problem: Activity: Goal: Ability to avoid complications of mobility impairment will improve Outcome: Adequate for Discharge Goal: Ability to tolerate increased activity will improve Outcome: Adequate for Discharge Goal: Will remain  free from falls Outcome: Adequate for Discharge   Problem: Bowel/Gastric: Goal: Gastrointestinal status for postoperative course will improve Outcome: Adequate for Discharge   Problem: Clinical Measurements: Goal: Ability to maintain clinical measurements within normal limits will improve Outcome: Adequate for Discharge Goal: Postoperative complications will be avoided or minimized Outcome: Adequate for Discharge Goal: Diagnostic test results will improve Outcome: Adequate for Discharge   Problem: Pain  Management: Goal: Pain level will decrease Outcome: Adequate for Discharge   Problem: Skin Integrity: Goal: Will show signs of wound healing Outcome: Adequate for Discharge   Problem: Health Behavior/Discharge Planning: Goal: Identification of resources available to assist in meeting health care needs will improve Outcome: Adequate for Discharge   Problem: Bladder/Genitourinary: Goal: Urinary functional status for postoperative course will improve Outcome: Adequate for Discharge   Problem: Acute Rehab OT Goals (only OT should resolve) Goal: Pt. Will Perform Grooming Outcome: Adequate for Discharge Goal: Pt. Will Perform Lower Body Dressing Outcome: Adequate for Discharge Goal: Pt. Will Transfer To Toilet Outcome: Adequate for Discharge Goal: OT Additional ADL Goal #1 Outcome: Adequate for Discharge   Problem: Acute Rehab PT Goals(only PT should resolve) Goal: Pt Will Transfer Bed To Chair/Chair To Bed Outcome: Adequate for Discharge Goal: Pt Will Verbalize and Adhere to Precautions While Description: PT Will Verbalize and Adhere to Precautions While Performing Mobility Outcome: Adequate for Discharge Goal: Pt/caregiver will Perform Home Exercise Program Outcome: Adequate for Discharge

## 2023-11-07 NOTE — Progress Notes (Addendum)
 PMR Admission Coordinator Pre-Admission Assessment   Patient: Kristina Long is an 68 y.o., female MRN: [id number] DOB: 05/11/1956 Height: 5\' 10"  (177.8 cm) Weight: 39.5 kg   Insurance Information HMO:     PPO:      PCP:      IPA:      80/20:      OTHER:  PRIMARY: U[employer] Medicare      Policy#: [id number], Medicare: [id number]      Subscriber: pt CM Name:       Phone#: [phone] Pt. Won appeal on 11/04/23 and was approved for admit 5/8-5/14     Fax#: [phone] Approval dates updated to 2/59-5/63 Pre-Cert#: [id number]       Employer:  Benefits:  Phone #:      Name:  Kristina Long Date: 06/27/2023- still active Deductible: $200 ($200 met) OOP Max: $2,200 ($200 met) CIR: After the $200.00 deductible has been met then $0.00 Copayment per admit for Medicare-covered hospital care. SNF: After the $200.00 deductible has been met then $0.00 Copayment per day for Medicare-covered care; up to 100 days. Outpatient:  After the $200.00 deductible has been met then $0.00 Copayment for each Medicare-covered occupational therapy visit. Home Health: After the $200.00 deductible has been met then $0.00 Copayment for all home health visits provided by a home health agency when Medicare criteria are met. DME: After the $200.00 deductible has been met then $0.00 Copayment for Durable Medical Equipment Providers: in network   SECONDARY:       Policy#:      Phone#:  Artist:       Phone#:    The Data processing manager" for patients in Inpatient Rehabilitation Facilities with attached "Privacy Act Statement-Health Care Records" was provided and verbally reviewed with: Patient   Emergency Contact Information Contact Information       Name Relation Home Work Mobile    [place] A Spouse [phone]             Other Contacts   None on File        Current Medical History  Patient Admitting Diagnosis: Epidural abscess History of Present Illness: Kristina Long is a 68 y.o. R  handed female with Hx of DM A1c 8.7 brought to ED 3 different times for fevers and severe back/hip and LE pain- after 3rd [facility] ED visit on 10/27/23, cans of lumbar and thoracic spine-showed severe epidural abscess from T11-L5- and severe canal stenosis and compression L1/2 and L2/3- she also was feeling weak due to compression.  She underwent surgery for decompression/lami and facetomy and foraminotomies on 10/29/23 with neurosurgery. Some delirium noted post operatively, but this has cleared. She was seen by PT/OT and they recommend CIR to assist return to PLOF.    Patient's medical record from [facility]  has been reviewed by the rehabilitation admission coordinator and physician.   Past Medical History      Past Medical History:  Diagnosis Date   Allergy     Diabetes mellitus without complication (HCC)        Has the patient had major surgery during 100 days prior to admission? Yes   Family History   family history includes Diabetes in her father and maternal grandmother.   Current Medications   Current Facility-Administered Medications:    0.9 %  sodium chloride  infusion, 250 mL, Intravenous, Continuous, Joaquin Mulberry, MD   0.9 % NaCl with KCl 20 mEq/ L  infusion, , Intravenous, Continuous, Jones,  Otelia Blew, MD, Stopped at 10/30/23 0310   acetaminophen  (TYLENOL ) tablet 1,000 mg, 1,000 mg, Oral, Q6H, Joaquin Mulberry, MD, 1,000 mg at 10/30/23 1154   amLODipine  (NORVASC ) tablet 10 mg, 10 mg, Oral, Daily, Joaquin Mulberry, MD, 10 mg at 10/30/23 1610   bisacodyl  (DULCOLAX) suppository 10 mg, 10 mg, Rectal, Daily, Joaquin Mulberry, MD, 10 mg at 10/30/23 0848   [START ON 10/31/2023] cefTRIAXone  (ROCEPHIN ) 2 g in sodium chloride  0.9 % 100 mL IVPB, 2 g, Intravenous, Q24H, Orlie Bjornstad, MD   celecoxib  (CELEBREX ) capsule 200 mg, 200 mg, Oral, Q12H, Joaquin Mulberry, MD, 200 mg at 10/30/23 9604   haloperidol  lactate (HALDOL ) injection 2 mg, 2 mg,  Intravenous, Q6H PRN, Gonfa, Taye T, MD   HYDROmorphone  (DILAUDID ) injection 0.5 mg, 0.5 mg, Intravenous, Q2H PRN, Joaquin Mulberry, MD   insulin  aspart (novoLOG ) injection 0-15 Units, 0-15 Units, Subcutaneous, TID WC, Joaquin Mulberry, MD, 3 Units at 10/30/23 1154   insulin  aspart (novoLOG ) injection 0-5 Units, 0-5 Units, Subcutaneous, QHS, Joaquin Mulberry, MD, 3 Units at 10/29/23 2158   insulin  aspart (novoLOG ) injection 7 Units, 7 Units, Subcutaneous, TID WC, Joaquin Mulberry, MD, 7 Units at 10/30/23 1154   insulin  glargine-yfgn (SEMGLEE ) injection 25 Units, 25 Units, Subcutaneous, Daily, Joaquin Mulberry, MD, 25 Units at 10/30/23 5409   labetalol  (NORMODYNE ) injection 10 mg, 10 mg, Intravenous, Q2H PRN, Joaquin Mulberry, MD   loratadine  (CLARITIN ) tablet 10 mg, 10 mg, Oral, Daily PRN, Joaquin Mulberry, MD   magnesium  citrate solution 1 Bottle, 1 Bottle, Oral, Daily PRN, Gonfa, Taye T, MD   menthol -cetylpyridinium (CEPACOL) lozenge 3 mg, 1 lozenge, Oral, PRN **OR** phenol (CHLORASEPTIC) mouth spray 1 spray, 1 spray, Mouth/Throat, PRN, Joaquin Mulberry, MD   methocarbamol  (ROBAXIN ) tablet 500 mg, 500 mg, Oral, Q6H PRN, 500 mg at 10/30/23 0319 **OR** methocarbamol  (ROBAXIN ) injection 500 mg, 500 mg, Intravenous, Q6H PRN, Joaquin Mulberry, MD   ondansetron  (ZOFRAN ) tablet 4 mg, 4 mg, Oral, Q6H PRN **OR** ondansetron  (ZOFRAN ) injection 4 mg, 4 mg, Intravenous, Q6H PRN, Joaquin Mulberry, MD   Oral care mouth rinse, 15 mL, Mouth Rinse, PRN, Gonfa, Taye T, MD   oxyCODONE  (Oxy IR/ROXICODONE ) immediate release tablet 5 mg, 5 mg, Oral, Q3H PRN, Joaquin Mulberry, MD, 5 mg at 10/30/23 0319   [COMPLETED] polyethylene glycol (MIRALAX  / GLYCOLAX ) packet 17 g, 17 g, Oral, BID, 17 g at 10/28/23 2206 **FOLLOWED BY** polyethylene glycol (MIRALAX  / GLYCOLAX ) packet 17 g, 17 g, Oral, BID PRN, Joaquin Mulberry, MD   [COMPLETED] senna-docusate (Senokot-S) tablet 2 tablet, 2 tablet, Oral, BID, 2  tablet at 10/28/23 2206 **FOLLOWED BY** senna-docusate (Senokot-S) tablet 2 tablet, 2 tablet, Oral, BID, Gonfa, Taye T, MD   sodium chloride  flush (NS) 0.9 % injection 3 mL, 3 mL, Intravenous, Q12H, Joaquin Mulberry, MD, 3 mL at 10/30/23 8119   sodium chloride  flush (NS) 0.9 % injection 3 mL, 3 mL, Intravenous, PRN, Joaquin Mulberry, MD   Patients Current Diet:  Diet Order                  Diet Carb Modified Fluid consistency: Thin; Room service appropriate? Yes  Diet effective now                         Precautions / Restrictions Precautions Precautions: Back, Fall Precaution/Restrictions Comments: back precautions for comfort, though pt not complying Restrictions Weight  Bearing Restrictions Per Provider Order: No    Has the patient had 2 or more falls or a fall with injury in the past year? No   Prior Activity Level Community (5-7x/wk): Pt active in the community PTA   Prior Functional Level Self Care: Did the patient need help bathing, dressing, using the toilet or eating? Independent   Indoor Mobility: Did the patient need assistance with walking from room to room (with or without device)? Independent   Stairs: Did the patient need assistance with internal or external stairs (with or without device)? Independent   Functional Cognition: Did the patient need help planning regular tasks such as shopping or remembering to take medications? Independent   Patient Information Are you of Hispanic, Latino/a,or Spanish origin?: A. No, not of Hispanic, Latino/a, or Spanish origin What is your race?: B. Black or African American Do you need or want an interpreter to communicate with a doctor or health care staff?: 0. No   Patient's Response To:  Health Literacy and Transportation Is the patient able to respond to health literacy and transportation needs?: Yes Health Literacy - How often do you need to have someone help you when you read instructions, pamphlets, or other  written material from your doctor or pharmacy?: Never In the past 12 months, has lack of transportation kept you from medical appointments or from getting medications?: No In the past 12 months, has lack of transportation kept you from meetings, work, or from getting things needed for daily living?: No   Journalist, newspaper / Equipment Home Equipment: Cane - single point, Shower seat, Grab bars - tub/shower, Hand held shower head (Husbands cane)   Prior Device Use: Indicate devices/aids used by the patient prior to current illness, exacerbation or injury? None of the above   Current Functional Level Cognition   Orientation Level: Oriented to person, Disoriented to place, Disoriented to time, Disoriented to situation    Extremity Assessment (includes Sensation/Coordination)   Upper Extremity Assessment: Overall WFL for tasks assessed  Lower Extremity Assessment: Defer to PT evaluation     ADLs   Overall ADL's : Needs assistance/impaired Eating/Feeding: Set up, Sitting Grooming: Set up, Sitting Upper Body Dressing : Set up, Sitting Lower Body Dressing: Moderate assistance, Sit to/from stand Lower Body Dressing Details (indicate cue type and reason): Pt unable to follow cues for full figure four for donning socks Toilet Transfer: Minimal assistance, Stand-pivot, BSC/3in1, Ambulation, Contact guard assist Toilet Transfer Details (indicate cue type and reason): SPT to [facility], ambulation from [facility] to chair Toileting- Clothing Manipulation and Hygiene: Minimal assistance, Sit to/from stand Toileting - Clothing Manipulation Details (indicate cue type and reason): Min assist to steady while standing & adhere to back precautions Functional mobility during ADLs: Minimal assistance, Contact guard assist General ADL Comments: Increased cueing for compensatory techniques     Mobility   Overal bed mobility: Needs Assistance Bed Mobility: Rolling, Sidelying to Sit Rolling: Contact guard  assist Sidelying to sit: Contact guard assist Supine to sit: Supervision, Used rails, HOB elevated General bed mobility comments: Cues for sequencing no physical assist     Transfers   Overall transfer level: Needs assistance Equipment used: Rolling walker (2 wheels) Transfers: Sit to/from Stand, Bed to chair/wheelchair/BSC Sit to Stand: Min assist Bed to/from chair/wheelchair/BSC transfer type:: Step pivot, Stand pivot Stand pivot transfers: Min assist Step pivot transfers: Contact guard assist General transfer comment: Increased cueing for safety with use of RW     Ambulation / Gait /  Stairs / Risk manager Details: pivotal steps to recliner, further ambulation distance limited by c/o pain     Posture / Balance Dynamic Sitting Balance Sitting balance - Comments: educ on figure four technique to don socks; able to don R sock with significant increased time, repeatedly attempting to bend down to feet to don socks despite cues; assist to don L sock Balance Overall balance assessment: Needs assistance Sitting-balance support: No upper extremity supported, Feet supported Sitting balance-Leahy Scale: Fair Sitting balance - Comments: educ on figure four technique to don socks; able to don R sock with significant increased time, repeatedly attempting to bend down to feet to don socks despite cues; assist to don L sock Standing balance support: Bilateral upper extremity supported, During functional activity, Reliant on assistive device for balance Standing balance-Leahy Scale: Fair Standing balance comment: Reliant on RW     Special needs/care consideration Skin surgical incision and Special service needs IV ABX via PICC    Previous Home Environment (from acute therapy documentation) Living Arrangements: Spouse/significant other Available Help at Discharge: Family, Available PRN/intermittently Type of Home: House Home Layout: One level Home Access:  Stairs to enter, Level entry (Level entry for front foor) Entrance Stairs-Rails: None Entrance Stairs-Number of Steps: 1 Bathroom Shower/Tub: Engineer, manufacturing systems: Yes How Accessible: Accessible via wheelchair Home Care Services: No Additional Comments: Husband works but is considering taking FMLA if needed   Discharge Living Setting Plans for Discharge Living Setting: Patient's home Type of Home at Discharge: House Discharge Home Layout: One level Discharge Home Access: Level entry Discharge Bathroom Shower/Tub: Walk-in shower Discharge Bathroom Toilet: Standard Discharge Bathroom Accessibility: Yes How Accessible: Accessible via walker, Accessible via wheelchair Does the patient have any problems obtaining your medications?: No   Social/Family/Support Systems Patient Roles: Spouse Contact Information: [phone] Anticipated Caregiver: Lyola [place] Anticipated Caregiver's Contact Information: Can take FMLA and provide 24/7 min A at d/c Discharge Plan Discussed with Primary Caregiver: Yes Is Caregiver In Agreement with Plan?: Yes   Goals Patient/Family Goal for Rehab: PT/OT Min A Expected length of stay: 12-14 days Pt/Family Agrees to Admission and willing to participate: Yes Program Orientation Provided & Reviewed with Pt/Caregiver Including Roles  & Responsibilities: Yes   Decrease burden of Care through IP rehab admission: Not anticipated   Possible need for SNF placement upon discharge: Not anticipated   Patient Condition: This patient's medical and functional status has changed since the consult dated: 11/02/23 in which the Rehabilitation Physician determined and documented that the patient's condition is appropriate for intensive rehabilitative care in an inpatient rehabilitation facility. See "History of Present Illness" (above) for medical update. Functional changes are: Pt. Now min A to CGA. Patient's medical and functional  status update has been discussed with the Rehabilitation physician and patient remains appropriate for inpatient rehabilitation. Will admit to inpatient rehab today.   Preadmission Screen Completed By:  Dorena Gander, CCC-SLP, 10/30/2023 12:41 PM ______________________________________________________________________   Discussed status with Dr. Rayleen Cal on 11/07/23 at 900 and received approval for admission today.   Admission Coordinator:  Dorena Gander, time 1006/Date 11/07/23

## 2023-11-08 ENCOUNTER — Inpatient Hospital Stay (HOSPITAL_COMMUNITY)

## 2023-11-08 DIAGNOSIS — M7989 Other specified soft tissue disorders: Secondary | ICD-10-CM

## 2023-11-08 DIAGNOSIS — G062 Extradural and subdural abscess, unspecified: Secondary | ICD-10-CM | POA: Diagnosis not present

## 2023-11-08 LAB — GLUCOSE, CAPILLARY
Glucose-Capillary: 162 mg/dL — ABNORMAL HIGH (ref 70–99)
Glucose-Capillary: 217 mg/dL — ABNORMAL HIGH (ref 70–99)
Glucose-Capillary: 118 mg/dL — ABNORMAL HIGH (ref 70–99)
Glucose-Capillary: 153 mg/dL — ABNORMAL HIGH (ref 70–99)

## 2023-11-08 LAB — CBC WITH DIFFERENTIAL/PLATELET
Abs Immature Granulocytes: 0.04 10*3/uL (ref 0.00–0.07)
Basophils Absolute: 0 10*3/uL (ref 0.0–0.1)
Basophils Relative: 0 %
Eosinophils Absolute: 0.1 10*3/uL (ref 0.0–0.5)
Eosinophils Relative: 1 %
HCT: 30.9 % — ABNORMAL LOW (ref 36.0–46.0)
Hemoglobin: 10 g/dL — ABNORMAL LOW (ref 12.0–15.0)
Immature Granulocytes: 0 %
Lymphocytes Relative: 25 %
Lymphs Abs: 2.5 10*3/uL (ref 0.7–4.0)
MCH: 27.5 pg (ref 26.0–34.0)
MCHC: 32.4 g/dL (ref 30.0–36.0)
MCV: 84.9 fL (ref 80.0–100.0)
Monocytes Absolute: 0.6 10*3/uL (ref 0.1–1.0)
Monocytes Relative: 5 %
Neutro Abs: 7 10*3/uL (ref 1.7–7.7)
Neutrophils Relative %: 69 %
Platelets: 460 10*3/uL — ABNORMAL HIGH (ref 150–400)
RBC: 3.64 MIL/uL — ABNORMAL LOW (ref 3.87–5.11)
RDW: 14.5 % (ref 11.5–15.5)
WBC: 10.2 10*3/uL (ref 4.0–10.5)
nRBC: 0 % (ref 0.0–0.2)

## 2023-11-08 LAB — COMPREHENSIVE METABOLIC PANEL WITH GFR
ALT: 23 U/L (ref 0–44)
AST: 27 U/L (ref 15–41)
Albumin: 2.8 g/dL — ABNORMAL LOW (ref 3.5–5.0)
Alkaline Phosphatase: 68 U/L (ref 38–126)
Anion gap: 7 (ref 5–15)
BUN: 18 mg/dL (ref 8–23)
CO2: 28 mmol/L (ref 22–32)
Calcium: 9.7 mg/dL (ref 8.9–10.3)
Chloride: 103 mmol/L (ref 98–111)
Creatinine, Ser: 0.74 mg/dL (ref 0.44–1.00)
GFR, Estimated: >60 mL/min (ref >60)
Glucose, Bld: 159 mg/dL — ABNORMAL HIGH (ref 70–99)
Potassium: 4.4 mmol/L (ref 3.5–5.1)
Sodium: 138 mmol/L (ref 135–145)
Total Bilirubin: 0.4 mg/dL (ref 0.0–1.2)
Total Protein: 8.5 g/dL — ABNORMAL HIGH (ref 6.5–8.1)

## 2023-11-08 MED ORDER — CHLORHEXIDINE GLUCONATE CLOTH 2 % EX PADS: 6.0000 | MEDICATED_PAD | Freq: Every day | CUTANEOUS | Status: DC

## 2023-11-08 MED ORDER — CHLORHEXIDINE GLUCONATE CLOTH 2 % EX PADS
6.0000 | MEDICATED_PAD | Freq: Two times a day (BID) | CUTANEOUS | Status: DC
  Administered 2023-11-09 – 2023-11-15 (×12): 6 via TOPICAL

## 2023-11-08 MED ORDER — SODIUM CHLORIDE 0.9% FLUSH
10.0000 mL | Freq: Two times a day (BID) | INTRAVENOUS | Status: DC
  Administered 2023-11-08 – 2023-11-15 (×9): 10 mL

## 2023-11-08 MED ORDER — ENOXAPARIN SODIUM 80 MG/0.8ML IJ SOSY
80.0000 mg | PREFILLED_SYRINGE | Freq: Two times a day (BID) | INTRAMUSCULAR | Status: DC
  Administered 2023-11-08 – 2023-11-13 (×10): 80 mg via SUBCUTANEOUS
  Filled 2023-11-08 (×10): qty 0.8

## 2023-11-08 MED ORDER — SODIUM CHLORIDE 0.9% FLUSH: 10.0000 mL | INTRAVENOUS | Status: DC | PRN

## 2023-11-08 MED ORDER — DOCUSATE SODIUM 100 MG PO CAPS
100.0000 mg | ORAL_CAPSULE | Freq: Two times a day (BID) | ORAL | Status: DC
  Administered 2023-11-08 – 2023-11-15 (×15): 100 mg via ORAL
  Filled 2023-11-08 (×15): qty 1

## 2023-11-08 NOTE — Progress Notes (Signed)
 Inpatient Rehabilitation  Patient information reviewed and entered into eRehab system by [facility]. Karen Kays., CCC/SLP, PPS Coordinator.  Information including medical coding, functional ability and quality indicators will be reviewed and updated through discharge.

## 2023-11-08 NOTE — Progress Notes (Signed)
 Educated on safety with bed alarm and chair alarm, patient non-compliant. Patient up in recliner with call bell within reach.

## 2023-11-08 NOTE — Care Management (Signed)
 Inpatient Rehabilitation Center Individual Statement of Services  Patient Name:  Kristina Long  Date:  11/08/2023  Welcome to the Inpatient Rehabilitation Center.  Our goal is to provide you with an individualized program based on your diagnosis and situation, designed to meet your specific needs.  With this comprehensive rehabilitation program, you will be expected to participate in at least 3 hours of rehabilitation therapies Monday-Friday, with modified therapy programming on the weekends.  Your rehabilitation program will include the following services:  Physical Therapy (PT), Occupational Therapy (OT), Speech Therapy (ST), 24 hour per day rehabilitation nursing, Therapeutic Recreaction (TR), Psychology, Neuropsychology, Care Coordinator, Rehabilitation Medicine, Nutrition Services, Pharmacy Services, and Other  Weekly team conferences will be held on Tuesdays to discuss your progress.  Your Inpatient Rehabilitation Care Coordinator will talk with you frequently to get your input and to update you on team discussions.  Team conferences with you and your family in attendance may also be held.  Expected length of stay: 7-10 days    Overall anticipated outcome: Supervision  Depending on your progress and recovery, your program may change. Your Inpatient Rehabilitation Care Coordinator will coordinate services and will keep you informed of any changes. Your Inpatient Rehabilitation Care Coordinator's name and contact numbers are listed  below.  The following services may also be recommended but are not provided by the Inpatient Rehabilitation Center:  Driving Evaluations Home Health Rehabiltiation Services Outpatient Rehabilitation Services Vocational Rehabilitation   Arrangements will be made to provide these services after discharge if needed.  Arrangements include referral to agencies that provide these services.  Your insurance has been verified to be:  [facility] Medicare  Your primary  doctor is:  Drucilla Georgis  Pertinent information will be shared with your doctor and your insurance company.  Inpatient Rehabilitation Care Coordinator:  Kathey Pang [phone] or ([phone]  Information discussed with and copy given to patient by: Rennis Case, 11/08/2023, 9:36 PM

## 2023-11-08 NOTE — Plan of Care (Signed)
  Problem: RH Balance Goal: LTG Patient will maintain dynamic standing with ADLs (OT) Description: LTG:  Patient will maintain dynamic standing balance with assist during activities of daily living (OT)  Flowsheets (Taken 11/08/2023 1224) LTG: Pt will maintain dynamic standing balance during ADLs with: Supervision/Verbal cueing   Problem: RH Bathing Goal: LTG Patient will bathe all body parts with assist levels (OT) Description: LTG: Patient will bathe all body parts with assist levels (OT) Flowsheets (Taken 11/08/2023 1224) LTG: Pt will perform bathing with assistance level/cueing: Supervision/Verbal cueing   Problem: RH Dressing Goal: LTG Patient will perform lower body dressing w/assist (OT) Description: LTG: Patient will perform lower body dressing with assist, with/without cues in positioning using equipment (OT) Flowsheets (Taken 11/08/2023 1224) LTG: Pt will perform lower body dressing with assistance level of: Supervision/Verbal cueing   Problem: RH Toileting Goal: LTG Patient will perform toileting task (3/3 steps) with assistance level (OT) Description: LTG: Patient will perform toileting task (3/3 steps) with assistance level (OT)  Flowsheets (Taken 11/08/2023 1224) LTG: Pt will perform toileting task (3/3 steps) with assistance level: Supervision/Verbal cueing   Problem: RH Simple Meal Prep Goal: LTG Patient will perform simple meal prep w/assist (OT) Description: LTG: Patient will perform simple meal prep with assistance, with/without cues (OT). Flowsheets (Taken 11/08/2023 1224) LTG: Pt will perform simple meal prep with assistance level of: Supervision/Verbal cueing   Problem: RH Light Housekeeping Goal: LTG Patient will perform light housekeeping w/assist (OT) Description: LTG: Patient will perform light housekeeping with assistance, with/without cues (OT). Flowsheets (Taken 11/08/2023 1224) LTG: Pt will perform light housekeeping with assistance level of:  Supervision/Verbal cueing   Problem: RH Toilet Transfers Goal: LTG Patient will perform toilet transfers w/assist (OT) Description: LTG: Patient will perform toilet transfers with assist, with/without cues using equipment (OT) Flowsheets (Taken 11/08/2023 1224) LTG: Pt will perform toilet transfers with assistance level of: Supervision/Verbal cueing   Problem: RH Tub/Shower Transfers Goal: LTG Patient will perform tub/shower transfers w/assist (OT) Description: LTG: Patient will perform tub/shower transfers with assist, with/without cues using equipment (OT) Flowsheets (Taken 11/08/2023 1224) LTG: Pt will perform tub/shower stall transfers with assistance level of: Supervision/Verbal cueing

## 2023-11-08 NOTE — Progress Notes (Signed)
 Patient Hemovac removed. Dry dressing placed. Patient tolerated well. Jenetta Misty, RN

## 2023-11-08 NOTE — Progress Notes (Signed)
 Inpatient Rehabilitation Admission Medication Review by a Pharmacist  A complete drug regimen review was completed for this patient to identify any potential clinically significant medication issues.  High Risk Drug Classes Is patient taking? Indication by Medication  Antipsychotic No   Anticoagulant No   Antibiotic Yes, as an intravenous medication Ceftriaxone  -strep pneumo bacteremia in setting of epidural abscess /MV endocarditis (Ceftriaxone  2g q24h per ID, plan for 6 wk using 5/6 as day #1.) End date/last dose due 12/10/23.  Opioid Yes Oxycodone  - pain  Antiplatelet No   Hypoglycemics/insulin  Yes Insulin  aspart [name], semglee  insulin , linagliptin  -diabetes  Vasoactive Medication Yes Amlodipine  , lisinopril -HTN  Chemotherapy No   Other Yes Acetaminophen  , celebrex - pain Methocarbamol  - muscle spasms  Ondansetron  - nausea     Type of Medication Issue Identified Description of Issue Recommendation(s)  Drug Interaction(s) (clinically significant)     Duplicate Therapy     Allergy     No Medication Administration End Date     Incorrect Dose     Additional Drug Therapy Needed     Significant med changes from prior encounter (inform family/care partners about these prior to discharge). Metformin  discontinued.   Restart PTA meds when and if necessary during CIR admission or at time of discharge, if warranted.   Communicate to patient /family/ caregiver prior to discharge.   Other       Clinically significant medication issues were identified that warrant physician communication and completion of prescribed/recommended actions by midnight of the next day:  No  Name of provider notified for urgent issues identified:   Provider Method of Notification:    Pharmacist comments:   Time spent performing this drug regimen review (minutes):  25   Alisa Irish, RPh Clinical Pharmacist 11/08/2023 2:03 PM  11/08/2023 2:03 PM

## 2023-11-08 NOTE — Progress Notes (Signed)
 PROGRESS NOTE   Subjective/Complaints:  Pt reports her LBM was yesterday- needs Colace to go.  Per nursing, pt noncompliant- trying to get OOB and chair and setting off bed alarm- pt is frustrated by alarm- explained if she's in rehab, we need her to get up with staff, not by herself- she voiced understanding. .  Pt reports pain isn't much-  Minor- but is having itching around incision.   Had 30-45 ml out from hemovac that was emptied this AM.   ROS:  Pt denies SOB, abd pain, CP, N/V/C/D, and vision changes   Objective:   US  EKG SITE RITE Result Date: 11/06/2023 If Site Rite image not attached, placement could not be confirmed due to current cardiac rhythm.  Recent Labs    11/07/23 0314 11/08/23 0550  WBC 10.9* 10.2  HGB 9.2* 10.0*  HCT 29.5* 30.9*  PLT 447* 460*   Recent Labs    11/07/23 0314 11/08/23 0550  NA 136 138  K 4.2 4.4  CL 101 103  CO2 24 28  GLUCOSE 160* 159*  BUN 25* 18  CREATININE 0.84 0.74  CALCIUM  9.4 9.7    Intake/Output Summary (Last 24 hours) at 11/08/2023 0854 Last data filed at 11/08/2023 0602 Gross per 24 hour  Intake 240 ml  Output 45 ml  Net 195 ml        Physical Exam: Vital Signs Blood pressure 118/73, pulse 94, temperature 98.5 F (36.9 C), temperature source Oral, resp. rate 18, height 5\' 10"  (1.778 m), weight 85.6 kg, SpO2 98%.    General: awake, alert, appropriate, sitting up in bedside chair; NAD HENT: conjugate gaze; oropharynx moist CV: regular rate and rhythm; no JVD Pulmonary: CTA B/L; no W/R/R- good air movement GI: soft, NT, ND, (+)BS Psychiatric: appropriate Neurological: Ox3 Skin: L-spine incision with honeycomb dressing, hemovac drain with serosanguineous drainage- almost empty- just emptied Neuro:     Mental Status: AAOx3 Speech/Languate:  fluent, follows simple commands CRANIAL NERVES: 2 through 12 grossly intact     MOTOR: RUE: 5/5 Deltoid,  5/5 Biceps, 5/5 Triceps,5/5 Grip LUE: 5/5 Deltoid, 5/5 Biceps, 5/5 Triceps, 5/5 Grip RLE: HF 4/5, KE 4+/5, ADF 5-/5, APF 5-/5 LLE: HF 4/5, KE 4+/5, ADF 5-/5, APF 5-/5     SENSORY: Normal to touch all 4 extremities, patient reports altered sensation in distal toes      Assessment/Plan: 1. Functional deficits which require 3+ hours per day of interdisciplinary therapy in a comprehensive inpatient rehab setting. Physiatrist is providing close team supervision and 24 hour management of active medical problems listed below. Physiatrist and rehab team continue to assess barriers to discharge/monitor patient progress toward functional and medical goals  Care Tool:  Bathing              Bathing assist       Upper Body Dressing/Undressing Upper body dressing        Upper body assist      Lower Body Dressing/Undressing Lower body dressing            Lower body assist       Toileting Toileting    Toileting assist  Transfers Chair/bed transfer  Transfers assist           Locomotion Ambulation   Ambulation assist              Walk 10 feet activity   Assist           Walk 50 feet activity   Assist           Walk 150 feet activity   Assist           Walk 10 feet on uneven surface  activity   Assist           Wheelchair     Assist               Wheelchair 50 feet with 2 turns activity    Assist            Wheelchair 150 feet activity     Assist          Blood pressure 118/73, pulse 94, temperature 98.5 F (36.9 C), temperature source Oral, resp. rate 18, height 5\' 10"  (1.778 m), weight 85.6 kg, SpO2 98%.  Medical Problem List and Plan: 1. Functional deficits secondary to strep pneumonaie MV endocarditis with epidural abscess extending from T11-L5 with resultant severe canal stenosis at L1-2 and L2-3 and moderate canal stenosis at L3-4.  Status post decompressive lumbar laminectomy,  medial facetectomy and foraminotomies from T12-L3 with evacuation of epidural abscess 10/29/2023 as well as lumbar reexploration for wound drainage with blunt and sharp debridement of soft tissue followed by irrigation of the tissue 11/05/2023.NO BRACE REQUIRED             -patient may shower if lumbar drain/incision can be kept dry             -ELOS/Goals: 12-14             -First day fo evaluations- Con't CIR PT and OT 2.  Antithrombotics: -DVT/anticoagulation:  Mechanical: Antiembolism stockings, thigh (TED hose) Bilateral lower extremities             -antiplatelet therapy: N/A 3. Pain Management: Celebrex  200 mg every 12 hours, Robaxin  and oxycodone  as needed  5/15- pt reports pain isn't bad- more itching 4. Mood/Behavior/Sleep: Provide emotional support             -antipsychotic agents: N/A 5. Neuropsych/cognition: This patient is capable of making decisions on her own behalf. 6. Skin/Wound Care: Routine skin checks 7. Fluids/Electrolytes/Nutrition: Routine in and outs with follow-up chemistries 8.  Mitral valve endocarditis.  IV Rocephin  2 g daily x 6 weeks USING 10/30/2023 AS DAY #1 Ending 12/11/2023 .  Follow-up infectious disease Dr. Artemio Larry as well as outpatient CVTS Dr. Luna Salinas.  Patient has PICC line 9.  Leukocytosis/acute blood loss anemia.  Follow-up CBC  5/15- WBC down to 10. 2 and Hb up to 10k 10.  Hypertension.  Norvasc  10 mg daily, lisinopril  5 mg daily.  Monitor with increased mobility             - Hospitalist recommends stop amlodipine  first if BP soft  5/15- BP looks better- con't regimen 11.  Diabetes mellitus.  Hemoglobin A1c 8.7.  Currently on NovoLog  7 units 3 times daily, Semglee  30 units daily, Tradjenta  5 mg daily.  Patient on Glucophage  1000 mg twice daily and Januvia  100 mg daily prior to admission.  Monitor with increased activity  5/15- CBGs 102-202- will monitor trend for 24 hours before changing anything 12.  Hyperlipidemia.  Lipitor  13.  Acute  encephalopathy/delirium.  Resolved. 14.  Proteinuria.  Continue lisinopril  15. Constipation             Scheduled MiraLAX , continue Senokot  5/15- pt asked for Colace- ordered for pt.    I spent a total of 38   minutes on total care today- >50% coordination of care- due to  Reviewed chart- and labs, vitals- and d/w nursing at length    LOS: 1 days A FACE TO FACE EVALUATION WAS PERFORMED  Kristina Long 11/08/2023, 8:54 AM

## 2023-11-08 NOTE — Progress Notes (Signed)
 VASCULAR LAB    Bilateral lower extremity venous duplex has been performed.  See CV proc for preliminary results.  Relayed results to Dr. Lovorn and Tomeka, RN  Lanette Pipe, Yomayra Tate, RVT 11/08/2023, 4:45 PM

## 2023-11-08 NOTE — Evaluation (Signed)
 Speech Language Pathology Assessment and Plan  Patient Details  Name: Kristina Long MRN: [id number] Date of Birth: Oct 14, 1955  SLP Diagnosis: Cognitive Impairments  Rehab Potential: Excellent ELOS: 7-10 days    Today's Date: 11/08/2023 SLP Individual Time: 1300-1400 SLP Individual Time Calculation (min): 60 min   Hospital Problem: Principal Problem:   Epidural abscess  Past Medical History:  Past Medical History:  Diagnosis Date   Allergy    Diabetes mellitus without complication (HCC)    Past Surgical History:  Past Surgical History:  Procedure Laterality Date   LUMBAR LAMINECTOMY FOR EPIDURAL ABSCESS N/A 10/29/2023   Procedure: LUMBAR LAMINECTOMY FOR EPIDURAL ABSCESS;  Surgeon: Joaquin Mulberry, MD;  [facility];  Service: Neurosurgery;  Laterality: N/A;   LUMBAR WOUND DEBRIDEMENT N/A 11/05/2023   Procedure: LUMBAR WOUND DEBRIDEMENT;  Surgeon: Joaquin Mulberry, MD;  Location: [facility] OR;  Service: Neurosurgery;  Laterality: N/A;  IRRIGATION AND DEBRIDEMENT LUMBAR WOUND   TRANSESOPHAGEAL ECHOCARDIOGRAM (CATH LAB) N/A 11/01/2023   Procedure: TRANSESOPHAGEAL ECHOCARDIOGRAM;  Surgeon: Jerryl Morin, DO;  Location: [facility] INVASIVE CV LAB;  Service: Cardiovascular;  Laterality: N/A;    Assessment / Plan / Recommendation Clinical Impression HPI: Kristina Long is a 68 y.o. R handed female with Hx of DM A1c 8.7 brought to ED 3 different times for fevers and severe back/hip and LE pain- after 3rd [facility] ED visit on 10/27/23, cans of lumbar and thoracic spine-showed severe epidural abscess from T11-L5- and severe canal stenosis and compression L1/2 and L2/3- she also was feeling weak due to compression.  She underwent surgery for decompression/lami and facetomy and foraminotomies on 10/29/23 with neurosurgery. Some delirium noted post operatively, but this has cleared.   Clinical Impression:  Cognitive-Linguistic: Patient was evaluated via the Cognistat to assess  cognitive-linguistic functioning. Patient scored WFL on all subtests despite moderate deficits in visual spatial skills. During visual spatial subtest, patient with informal emergent awareness deficits requiring cues to review and correct work. Patient able to recall in depth medical history, though very verbose throughout the session requiring cues to continue assessment. Patient formally independent with meds/finances/etc, therefore recommend continued assessment regarding problem solving.  Pt would benefit from skilled ST services to maximize cognition in order to maximize functional independence at d/c. Anticipate patient will require 24 hour supervision at d/c and f/u SLP services.    Skilled Therapeutic Interventions          Patient evaluated using a standardized cognitive linguistic assessment to assess current cognitive, and communicative function. See above for details.    SLP Assessment  Patient will need skilled Speech Lanaguage Pathology Services during CIR admission    Recommendations  SLP Diet Recommendations: Age appropriate regular solids;Thin Liquid Administration via: Straw Medication Administration: Whole meds with liquid Supervision: Patient able to self feed Compensations: Slow rate;Small sips/bites Postural Changes and/or Swallow Maneuvers: Seated upright 90 degrees Oral Care Recommendations: Oral care BID Patient destination: Home Follow up Recommendations: None Equipment Recommended: To be determined    SLP Frequency 1 to 3 out of 7 days   SLP Duration  SLP Intensity  SLP Treatment/Interventions 7-10 days  Minumum of 1-2 x/day, 30 to 90 minutes  Cognitive remediation/compensation;Functional tasks;Patient/family education;Therapeutic Activities    Pain None reported   SLP Evaluation Cognition Overall Cognitive Status: Impaired/Different from baseline Arousal/Alertness: Awake/alert Orientation Level: Oriented X4 Year: 2025 Month: May Day of Week:  Correct Attention: Focused;Sustained Focused Attention: Appears intact Sustained Attention: Appears intact Memory: Impaired Memory Impairment: Retrieval deficit;Decreased recall  of new information Awareness: Impaired Awareness Impairment: Emergent impairment Problem Solving: Impaired Problem Solving Impairment: Functional complex;Verbal complex Safety/Judgment: Impaired Comments: Decreased carryover of back precautions.  Comprehension Auditory Comprehension Overall Auditory Comprehension: Appears within functional limits for tasks assessed Yes/No Questions: Within Functional Limits Commands: Within Functional Limits Expression Expression Primary Mode of Expression: Verbal Verbal Expression Overall Verbal Expression: Appears within functional limits for tasks assessed Initiation: No impairment Repetition: No impairment Naming: No impairment Oral Motor Oral Motor/Sensory Function Overall Oral Motor/Sensory Function: Within functional limits Motor Speech Overall Motor Speech: Appears within functional limits for tasks assessed Respiration: Within functional limits Phonation: Normal Resonance: Within functional limits Articulation: Within functional limitis Intelligibility: Intelligible Motor Planning: Witnin functional limits  Care Tool Care Tool Cognition Ability to hear (with hearing aid or hearing appliances if normally used Ability to hear (with hearing aid or hearing appliances if normally used): 0. Adequate - no difficulty in normal conservation, social interaction, listening to TV   Expression of Ideas and Wants Expression of Ideas and Wants: 4. Without difficulty (complex and basic) - expresses complex messages without difficulty and with speech that is clear and easy to understand   Understanding Verbal and Non-Verbal Content Understanding Verbal and Non-Verbal Content: 4. Understands (complex and basic) - clear comprehension without cues or repetitions   Memory/Recall Ability Memory/Recall Ability : That he or she is in a hospital/hospital unit;Location of own room   Short Term Goals: Week 1: SLP Short Term Goal 1 (Week 1): STG = LTG due to ELOS  Refer to Care Plan for Long Term Goals  Recommendations for other services: None   Discharge Criteria: Patient will be discharged from SLP if patient refuses treatment 3 consecutive times without medical reason, if treatment goals not met, if there is a change in medical status, if patient makes no progress towards goals or if patient is discharged from hospital.  The above assessment, treatment plan, treatment alternatives and goals were discussed and mutually agreed upon: by patient  Consepcion Utt M.A., CCC-SLP 11/08/2023, 3:36 PM

## 2023-11-08 NOTE — Evaluation (Signed)
 Physical Therapy Assessment and Plan  Patient Details  Name: Kristina Long MRN: [id number] Date of Birth: 1955/12/31  PT Diagnosis: Difficulty walking, Impaired cognition, Impaired sensation, Low back pain, and Muscle weakness Rehab Potential: Excellent ELOS: 7-10 days   Today's Date: 11/08/2023 PT Individual Time: 0845-1000 PT Individual Time Calculation (min): 75 min    Hospital Problem: Principal Problem:   Epidural abscess   Past Medical History:  Past Medical History:  Diagnosis Date   Allergy    Diabetes mellitus without complication (HCC)    Past Surgical History:  Past Surgical History:  Procedure Laterality Date   LUMBAR LAMINECTOMY FOR EPIDURAL ABSCESS N/A 10/29/2023   Procedure: LUMBAR LAMINECTOMY FOR EPIDURAL ABSCESS;  Surgeon: Joaquin Mulberry, MD;  Location: [facility] OR;  Service: Neurosurgery;  Laterality: N/A;   LUMBAR WOUND DEBRIDEMENT N/A 11/05/2023   Procedure: LUMBAR WOUND DEBRIDEMENT;  Surgeon: Joaquin Mulberry, MD;  Location: [facility] OR;  Service: Neurosurgery;  Laterality: N/A;  IRRIGATION AND DEBRIDEMENT LUMBAR WOUND   TRANSESOPHAGEAL ECHOCARDIOGRAM (CATH LAB) N/A 11/01/2023   Procedure: TRANSESOPHAGEAL ECHOCARDIOGRAM;  Surgeon: Jerryl Morin, DO;  Location: [facility] INVASIVE CV LAB;  Service: Cardiovascular;  Laterality: N/A;    Assessment & Plan Clinical Impression: Kristina Long is a 68 year old right-handed female with history significant for hypertension, diabetes mellitus, hyperlipidemia.  Per chart review patient lives with spouse.  1 level home one-step to entry.  Independent prior to admission.  Presented 10/26/2023 with progressive worsening low back pain and right flank pain.  Patient was seen in the ED 4/28 and 4/30 for same complaints and was discharged to home and at that time she had a low-grade fever of 100 per her husband.  CT scan renal showed no hydronephrosis and she was provided with a muscle relaxer.  She now presented back to the ED 5/2 with progressive  back pain.  She reportedly accidentally took 8 (200 mg) of ibuprofen and 2 oxycodone  and her muscle relaxer.  Patient was noted to have altered mental status.  Admission labs 10/26/2023 showed a WBC of 16,500 with left shift, glucose 310, total bilirubin 1.3, hemoglobin A1c 8.7.  She was initially unable to tolerate MRI.  Lumbar spine films 10/27/2023 showed no acute osseous abnormality.  She did have some spondylosis at L4-5 and L5-S1.  MRI lumbar and thoracic spine completed showing heterogeneous posterior epidural collection extending from T11-T12 to L4-5, measuring up to 1.4 cm in thickness at L1-L2.  Resulting in severe canal stenosis at T12-L1, L1-2 and L2-3.  Neurosurgery Dr. Waymond Hailey consulted patient underwent decompressive lumbar laminectomy, medial facetectomy and foraminotomies T12-L1, L1-L2, L2-L3 for evacuation of epidural abscess 10/29/2023.  Blood and abscess culture with Staphylococcus pneumonia and infectious disease consulted initially placed on ceftriaxone .  Repeat blood cultures 10/30/2023 no growth to date.  TEE 5/9 revealed mitral valve endocarditis with mobile mass (1.5 cm x 0.482 cm) on the anterior mitral valve leaflet..  Cardiothoracic surgery team consulted Dr. Luna Salinas advised to continue IV antibiotics and follow-up outpatient.  Patient with persistent wound drainage after initial surgery underwent lumbar reexploration with blunt and sharp debridement of soft tissues followed by irrigation of the tissues and primary closure 11/05/2023 per Dr. Waymond Hailey.  Plan is currently to remain on IV ceftriaxone  2 g intravenously daily x 6 weeks using 10/30/18/2025 as day 1.  Leukocytosis improved 10,900 as well as mental status back to baseline.  She reports her lower back pain is overall under control.  Reports no difficulty emptying her bladder.  Reports last bowel movement was 1 to 2 days ago, continent.  Therapy evaluations completed due to patient decreased functional mobility was admitted for a  comprehensive rehab program  Patient transferred to CIR on 11/07/2023 .   Patient currently requires min with mobility secondary to muscle weakness, unbalanced muscle activation, and decreased standing balance, decreased balance strategies, and difficulty maintaining precautions.  Prior to hospitalization, patient was independent  with mobility and lived with Spouse in a House home.  Home access is 1Stairs to enter.  Patient will benefit from skilled PT intervention to maximize safe functional mobility, minimize fall risk, and decrease caregiver burden for planned discharge home with 24 hour supervision.  Anticipate patient will benefit from follow up OP at discharge.  PT - End of Session Activity Tolerance: Tolerates 10 - 20 min activity with multiple rests Endurance Deficit: Yes PT Assessment Rehab Potential (ACUTE/IP ONLY): Excellent PT Barriers to Discharge: Home environment access/layout;Wound Care PT Patient demonstrates impairments in the following area(s): Balance;Pain;Endurance;Sensory PT Transfers Functional Problem(s): Car;Bed to Chair;Bed Mobility PT Locomotion Functional Problem(s): Ambulation;Stairs PT Plan PT Intensity: Minimum of 1-2 x/day ,45 to 90 minutes PT Frequency: 5 out of 7 days PT Duration Estimated Length of Stay: 7-10 days PT Treatment/Interventions: Ambulation/gait training;Discharge planning;Cognitive remediation/compensation;DME/adaptive equipment instruction;Functional mobility training;Pain management;Psychosocial support;Splinting/orthotics;Therapeutic Activities;UE/LE Strength taining/ROM;Visual/perceptual remediation/compensation;Wheelchair propulsion/positioning;UE/LE Coordination activities;Therapeutic Exercise;Stair training;Skin care/wound management;Patient/family education;Neuromuscular re-education;Functional electrical stimulation;Disease management/prevention;Community reintegration;Balance/vestibular training PT Transfers Anticipated Outcome(s):  supervision PT Locomotion Anticipated Outcome(s): supervision PT Recommendation Follow Up Recommendations: Outpatient PT Patient destination: Home Equipment Recommended: To be determined   PT Evaluation Precautions/Restrictions Precautions Precautions: Back;Fall;Other (comment) Precaution Booklet Issued: No Recall of Precautions/Restrictions: Intact Precaution/Restrictions Comments: Pt able to recall BLT; Drain; RUE PICC Restrictions Weight Bearing Restrictions Per Provider Order: No General   Vital SignsTherapy Vitals Temp: 98.2 F (36.8 C) Temp Source: Oral Pulse Rate: 98 Resp: 17 BP: 139/74 Patient Position (if appropriate): Sitting Oxygen  Therapy SpO2: 100 % O2 Device: Room Air Pain Pain Assessment Pain Scale: 0-10 Pain Score: 1  Pain Type: Surgical pain Pain Location: Back Pain Intervention(s): Medication (See eMAR);Rest Pain Interference   Home Living/Prior Functioning Home Living Available Help at Discharge: Family;Available PRN/intermittently (Patient's husband able to take time off, but planning to go back to work full-time.) Type of Home: House Home Access: Stairs to enter Secretary/administrator of Steps: 1 Entrance Stairs-Rails: None Home Layout: One level Bathroom Shower/Tub: Walk-in shower;Tub/shower unit (Built in bench in walk-in shower, grab bars in both showers.) Bathroom Toilet: Standard Bathroom Accessibility: Yes  Lives With: Spouse Prior Function Level of Independence: Independent with gait;Independent with transfers;Independent with basic ADLs;Independent with homemaking with ambulation Driving: Yes Vocation: Volunteer work Vision/Perception  Vision - History Ability to See in Adequate Light: 1 Impaired Perception Perception: Within Functional Limits Praxis Praxis: WFL  Cognition Overall Cognitive Status: No family/caregiver present to determine baseline cognitive functioning Arousal/Alertness: Awake/alert Orientation Level:  Oriented X4 Memory: Impaired Memory Impairment: Retrieval deficit;Decreased recall of new information Awareness: Impaired Awareness Impairment: Intellectual impairment Problem Solving: Impaired Problem Solving Impairment: Functional complex Safety/Judgment: Impaired Comments: Decreased carryover of back precautions. Sensation Sensation Light Touch: Impaired Detail Peripheral sensation comments: Decreased sensation in B feet when in cold temperatures, improved since transferring to CIR. Light Touch Impaired Details: Impaired RLE;Impaired LLE Hot/Cold: Appears Intact Proprioception: Appears Intact Additional Comments: provided education on skin checks daily Coordination Gross Motor Movements are Fluid and Coordinated: No Coordination and Movement Description: R>L Motor  Motor Motor: Within Functional Limits   Trunk/Postural Assessment  Cervical Assessment Cervical Assessment: Within Functional Limits Thoracic Assessment Thoracic Assessment: Within Functional Limits Lumbar Assessment Lumbar Assessment: Exceptions to [facility] (back precautions) Postural Control Postural Control: Deficits on evaluation Righting Reactions: Decreased Protective Responses: Decreased  Balance Balance Balance Assessed: Yes Standardized Balance Assessment Standardized Balance Assessment: Berg Balance Test;Timed Up and Go Test Berg Balance Test Sit to Stand: Able to stand  independently using hands Standing Unsupported: Able to stand 2 minutes with supervision Sitting with Back Unsupported but Feet Supported on Floor or Stool: Able to sit safely and securely 2 minutes Stand to Sit: Controls descent by using hands Transfers: Able to transfer safely, definite need of hands Standing Unsupported with Eyes Closed: Able to stand 10 seconds with supervision Standing Ubsupported with Feet Together: Able to place feet together independently and stand 1 minute safely From Standing, Reach Forward with  Outstretched Arm: Can reach forward >12 cm safely (5") From Standing Position, Pick up Object from Floor: Able to pick up shoe, needs supervision From Standing Position, Turn to Look Behind Over each Shoulder: Turn sideways only but maintains balance (back precautions) Turn 360 Degrees: Able to turn 360 degrees safely but slowly Standing Unsupported, Alternately Place Feet on Step/Stool: Able to stand independently and safely and complete 8 steps in 20 seconds Standing Unsupported, One Foot in Front: Able to place foot tandem independently and hold 30 seconds Standing on One Leg: Able to lift leg independently and hold > 10 seconds Total Score: 45 Timed Up and Go Test TUG: Normal TUG Normal TUG (seconds): 13.5 Extremity Assessment  RUE Assessment RUE Assessment: Within Functional Limits LUE Assessment LUE Assessment: Within Functional Limits RLE Assessment RLE Assessment: Exceptions to [facility] General Strength Comments: grossly 3+/5, improved slightly with repetition LLE Assessment LLE Assessment: Exceptions to [facility] General Strength Comments: hip and knee extension 4-/5, ankle and knee flexion 5/5, pt reports feels close to normal  Care Tool Care Tool Bed Mobility Roll left and right activity   Roll left and right assist level: Supervision/Verbal cueing    Sit to lying activity   Sit to lying assist level: Supervision/Verbal cueing    Lying to sitting on side of bed activity   Lying to sitting on side of bed assist level: the ability to move from lying on the back to sitting on the side of the bed with no back support.: Supervision/Verbal cueing     Care Tool Transfers Sit to stand transfer   Sit to stand assist level: Contact Guard/Touching assist    Chair/bed transfer   Chair/bed transfer assist level: Contact Guard/Touching assist    Car transfer   Car transfer assist level: Contact Guard/Touching assist      Care Tool Locomotion Ambulation   Assist level: Contact  Guard/Touching assist Assistive device: No Device Max distance: 500  Walk 10 feet activity   Assist level: Contact Guard/Touching assist Assistive device: No Device   Walk 50 feet with 2 turns activity   Assist level: Contact Guard/Touching assist Assistive device: No Device  Walk 150 feet activity   Assist level: Contact Guard/Touching assist Assistive device: No Device  Walk 10 feet on uneven surfaces activity   Assist level: Contact Guard/Touching assist    Stairs   Assist level: Contact Guard/Touching assist Stairs assistive device: 2 hand rails Max number of stairs: 12  Walk up/down 1 step activity   Walk up/down 1 step (curb) assist level: Contact Guard/Touching assist Walk up/down 1 step or curb assistive device: No device  Walk up/down  4 steps activity   Walk up/down 4 steps assist level: Contact Guard/Touching assist Walk up/down 4 steps assistive device: 2 hand rails  Walk up/down 12 steps activity   Walk up/down 12 steps assist level: Contact Guard/Touching assist Walk up/down 12 steps assistive device: 2 hand rails  Pick up small objects from floor   Pick up small object from the floor assist level: Contact Guard/Touching assist    Wheelchair Is the patient using a wheelchair?: No          Wheel 50 feet with 2 turns activity      Wheel 150 feet activity        Refer to Care Plan for Long Term Goals  SHORT TERM GOAL WEEK 1 PT Short Term Goal 1 (Week 1): =LTGs d/t ELOS  Recommendations for other services: None   Skilled Therapeutic Intervention Evaluation completed (see details above) with patient education regarding purpose of PT evaluation, PT POC and goals, therapy schedule, weekly team meetings, and other CIR information including safety plan and fall risk safety. No complaint of pain.  Pt performed the below functional mobility tasks with the specified levels of skilled cuing and assistance.  Pt returned to room and remained seated in w/c, was left  with all needs in reach and alarm active.    Mobility Bed Mobility Bed Mobility: Supine to Sit;Sit to Supine Supine to Sit: Supervision/Verbal cueing Sit to Supine: Supervision/Verbal cueing Transfers Transfers: Sit to Stand;Stand to Sit;Stand Pivot Transfers Sit to Stand: Contact Guard/Touching assist;Supervision/Verbal cueing Stand to Sit: Contact Guard/Touching assist;Supervision/Verbal cueing Stand Pivot Transfers: Contact Guard/Touching assist;Supervision/Verbal cueing Stand Pivot Transfer Details: Verbal cues for precautions/safety Transfer (Assistive device): None Locomotion  Gait Ambulation: Yes Gait Assistance: Contact Guard/Touching assist Gait Distance (Feet): 500 Feet Gait Gait: Yes Gait Pattern: Impaired (decreased speed, mild path deviations with distraction) Stairs / Additional Locomotion Stairs: Yes Stairs Assistance: Contact Guard/Touching assist Stair Management Technique: Two rails Number of Stairs: 12 Ramp: Contact Guard/touching assist Curb: Contact Guard/Touching assist Wheelchair Mobility Wheelchair Mobility: No   Discharge Criteria: Patient will be discharged from PT if patient refuses treatment 3 consecutive times without medical reason, if treatment goals not met, if there is a change in medical status, if patient makes no progress towards goals or if patient is discharged from hospital.  The above assessment, treatment plan, treatment alternatives and goals were discussed and mutually agreed upon: by patient  Tex Filbert 11/08/2023, 1:05 PM

## 2023-11-08 NOTE — Progress Notes (Signed)
 Patient ID: Kristina Long, female   DOB: [birth year], 68 y.o.   MRN: [id number]   SW made contact with [name]/[facility] to discuss to review and run pt insurance.   SW spoke with pt husband Kristina Long to inform on ELOS, and discuss family edu.   Norval Been, MSW, LCSW Office: [phone] Cell: [phone] Fax: [phone]

## 2023-11-08 NOTE — Plan of Care (Signed)
  Problem: RH Problem Solving Goal: LTG Patient will demonstrate problem solving for (SLP) Description: LTG:  Patient will demonstrate problem solving for basic/complex daily situations with cues  (SLP) Flowsheets (Taken 11/08/2023 1540) LTG: Patient will demonstrate problem solving for (SLP): (mildly complex) Other (comment) LTG Patient will demonstrate problem solving for: Modified Independent   Problem: RH Awareness Goal: LTG: Patient will demonstrate awareness during functional activites type of (SLP) Description: LTG: Patient will demonstrate awareness during functional activites type of (SLP) Flowsheets (Taken 11/08/2023 1540) Patient will demonstrate during cognitive/linguistic activities awareness type of: Emergent LTG: Patient will demonstrate awareness during cognitive/linguistic activities with assistance of (SLP): Modified Independent

## 2023-11-08 NOTE — Progress Notes (Signed)
 Inpatient Rehabilitation Care Coordinator Assessment and Plan Patient Details  Name: Kristina Long MRN: [id number] Date of Birth: [birth year]  Today's Date: 11/08/2023  Hospital Problems: Principal Problem:   Epidural abscess  Past Medical History:  Past Medical History:  Diagnosis Date   Allergy    Diabetes mellitus without complication [facility])    Past Surgical History:  Past Surgical History:  Procedure Laterality Date   LUMBAR LAMINECTOMY FOR EPIDURAL ABSCESS N/A 10/29/2023   Procedure: LUMBAR LAMINECTOMY FOR EPIDURAL ABSCESS;  Surgeon: Joaquin Mulberry, MD;  Location: [facility] OR;  Service: Neurosurgery;  Laterality: N/A;   LUMBAR WOUND DEBRIDEMENT N/A 11/05/2023   Procedure: LUMBAR WOUND DEBRIDEMENT;  Surgeon: Joaquin Mulberry, MD;  Location: [facility] OR;  Service: Neurosurgery;  Laterality: N/A;  IRRIGATION AND DEBRIDEMENT LUMBAR WOUND   TRANSESOPHAGEAL ECHOCARDIOGRAM (CATH LAB) N/A 11/01/2023   Procedure: TRANSESOPHAGEAL ECHOCARDIOGRAM;  Surgeon: Jerryl Morin, DO;  Location: [facility] INVASIVE CV LAB;  Service: Cardiovascular;  Laterality: N/A;   Social History:  reports that she has never smoked. She has never used smokeless tobacco. She reports that she does not drink alcohol and does not use drugs.  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, community: college grad Primary school teacher - How often do you need to have someone help you when you read instructions, pamphlets, or other written material from your  doctor or pharmacy?: Never Writes: Yes Employment Status: Retired Date Retired/Disabled/Unemployed: Retired Age Retired: 64 Marine scientist Issues: Denies Guardian/Conservator: Secretary/administrator   Abuse/Neglect Abuse/Neglect Assessment Can Be Completed: Yes Physical Abuse: Denies Verbal Abuse: Denies Sexual Abuse: Denies Exploitation of patient/patient's resources: Denies Self-Neglect: Denies  Patient response to: Social Isolation - How often do you feel lonely or isolated from those around you?: Never  Emotional Status Pt's affect, behavior and adjustment status: Pt adjusting as well as to be expected. Recent Psychosocial Issues: Denies Psychiatric History: Denies Substance Abuse History: Denies  Patient / Family Perceptions, Expectations & Goals Pt/Family understanding of illness & functional limitations: Pt and husband have a general understanding of care needs Premorbid pt/family roles/activities: Independent Anticipated changes in roles/activities/participation: Assistance with ADLs/IADLs Pt/family expectations/goals: Pt reports she would like to work on balance while here, and services with speech therapy.  Community Resources [employer]: None Premorbid Home Care/DME Agencies: None Transportation available at discharge: Husband Royston Cornea Is the patient able to respond to transportation needs?: Yes In the past 12 months, has lack of transportation kept you from medical appointments or from getting medications?: Yes In the past 12 months, has lack of transportation kept you from meetings, work, or from getting things needed for daily living?: No Resource referrals recommended: Neuropsychology  Discharge Planning Living Arrangements: Spouse/significant other Support Systems: Spouse/significant other Type of Residence: Private residence Insurance Resources: Media planner (specify) ([employer] Medicare) Surveyor, quantity Resources: Restaurant manager, fast food Screen  Referred: No Living Expenses: Banker Management: Patient Does the patient have any problems obtaining your medications?: No Home Management: Husband prepares all meals; pt does housekeeping tasks Patient/Family Preliminary Plans: TBD Care Coordinator Barriers to Discharge: Decreased caregiver support, Lack of/limited family support, IV antibiotics Care Coordinator Anticipated  Follow Up Needs: HH/OP  Clinical Impression SW met with pt in room to introduce self, explain role, and discuss discharge process. Pt is not a Cytogeneticist. States HCPOA completed but not on file. No DME. She is aware SW will follow-up with her husband Royston Cornea.   *IV abx through 5/17.   [name] A Jeramie Scogin 11/08/2023, 2:18 PM

## 2023-11-08 NOTE — Progress Notes (Signed)
 Nurse received secure chat for result of vascular US . Patient is positive for acute DVT in the bilateral posterior tibial and peroneal veins. PA Dan made aware and patient placed on Lovenox per pharmacy protocol for DVT. Jenetta Misty, RN

## 2023-11-08 NOTE — Progress Notes (Incomplete)
 Patient currently on Claritin . She states she is having increased congestion. Patient currently has a number of flowers in her room and was suggested for family to take flowers home.

## 2023-11-08 NOTE — Progress Notes (Signed)
 PHARMACY - ANTICOAGULATION CONSULT NOTE  Pharmacy Consult for enoxaparin Indication: DVT  Allergies  Allergen Reactions   Blueberry [Vaccinium Angustifolium] Swelling and Other (See Comments)    Lips swell    Patient Measurements: Height: 5\' 10"  (177.8 cm) Weight: 85.6 kg (188 lb 11.4 oz) IBW/kg (Calculated) : 68.5 HEPARIN  DW (KG): 85.6  Vital Signs: Temp: 98.2 F (36.8 C) (05/15 1245) Temp Source: Oral (05/15 1245) BP: 139/74 (05/15 1245) Pulse Rate: 98 (05/15 1245)  Labs: Recent Labs    11/06/23 0622 11/06/23 0921 11/06/23 0921 11/07/23 0314 11/08/23 0550  HGB  --  9.9*   < > 9.2* 10.0*  HCT  --  31.3*  --  29.5* 30.9*  PLT  --  469*  --  447* 460*  CREATININE 0.81  --   --  0.84 0.74   < > = values in this interval not displayed.    Estimated Creatinine Clearance: 78.9 mL/min (by C-G formula based on SCr of 0.74 mg/dL).   Medical History: Past Medical History:  Diagnosis Date   Allergy    Diabetes mellitus without complication (HCC)     Medications:  Medications Prior to Admission  Medication Sig Dispense Refill Last Dose/Taking   acetaminophen  (TYLENOL ) 500 MG tablet Take 2 tablets (1,000 mg total) by mouth every 6 (six) hours for 2 days. 16 tablet 0    amLODipine  (NORVASC ) 5 MG tablet Take 1 tablet (5 mg total) by mouth daily.      atorvastatin  (LIPITOR) 10 MG tablet Take 1 tablet (10 mg total) by mouth daily. (Patient taking differently: Take 10 mg by mouth at bedtime.) 90 tablet 3    calcium  carbonate (OS-CAL) 600 MG tablet Take 600 mg by mouth daily.      cefTRIAXone  2 g in sodium chloride  0.9 % 100 mL Inject 2 g into the vein daily.      celecoxib  (CELEBREX ) 200 MG capsule Take 1 capsule (200 mg total) by mouth every 12 (twelve) hours for 2 days. 4 capsule 0    CLARITIN -D 12 HOUR 5-120 MG tablet Take 1 tablet by mouth 2 (two) times daily as needed for allergies.      insulin  aspart (NOVOLOG ) 100 UNIT/ML injection Inject 7 Units into the skin 3  (three) times daily with meals.      insulin  glargine-yfgn (SEMGLEE ) 100 UNIT/ML injection Inject 0.2 mLs (20 Units total) into the skin daily.      Lancets (ONETOUCH DELICA PLUS LANCET33G) MISC USE AS INSTRUCTED TO CHECK BLOOD SUGAR 2 TIMES PER DAY 100 each 0    lisinopril  (ZESTRIL ) 5 MG tablet Take 1 tablet (5 mg total) by mouth daily.      metaxalone  (SKELAXIN ) 800 MG tablet Take 1 tablet (800 mg total) by mouth 3 (three) times daily. 21 tablet 0    methocarbamol  (ROBAXIN ) 500 MG tablet Take 1 tablet (500 mg total) by mouth every 8 (eight) hours as needed. (Patient taking differently: Take 500 mg by mouth every 8 (eight) hours as needed for muscle spasms.) 8 tablet 0    ONETOUCH VERIO test strip USE AS DIRECTED TO TEST BLOOD SUGAR TWICE A DAY 100 strip 5    oxyCODONE  (OXY IR/ROXICODONE ) 5 MG immediate release tablet Take 1 tablet (5 mg total) by mouth every 6 (six) hours as needed for moderate pain (pain score 4-6).      polyethylene glycol (MIRALAX  / GLYCOLAX ) 17 g packet Take 17 g by mouth 2 (two) times daily as needed for  mild constipation.      senna-docusate (SENOKOT-S) 8.6-50 MG tablet Take 2 tablets by mouth 2 (two) times daily.      sitaGLIPtin  (JANUVIA ) 100 MG tablet Take 1 tablet (100 mg total) by mouth daily. 90 tablet 2     Assessment: 67 yof admitted to CIR on 5/14 with Strep pneumo bacteremia in setting of epidural abscess, MV endocarditis, and s/p decompressive lumbar laminectomy, medial facetectomy and foraminostomies from T12-L3 with evacuation of epidural abscess. LE duplex reveals acute DVT in the bilateral posterior tibial and peroneal veins. Pharmacy consulted to begin enoxaparin. No bleeding noted, Hgb stable 9-10s, platelets are elevated at 460.  Goal of Therapy:  Anti-Xa level 0.6-1 units/ml 4hrs after LMWH dose given Monitor platelets by anticoagulation protocol: Yes   Plan:  Enoxaparin 80 mg SQ q12h CBC every 3 days Monitor for s/sx of bleeding F/u ability to  transition to oral agent  Thank you for involving pharmacy in this patient's care.  Caroline Cinnamon, PharmD, BCPS Clinical Pharmacist Clinical phone for 11/08/2023 is [phone] 11/08/2023 5:09 PM

## 2023-11-08 NOTE — Progress Notes (Signed)
 Venous Doppler report 11/08/2023 showed bilateral posterior tibial as well as peroneal DVTs.  Neurosurgery consulted Dr. Andy Bannister advised patient was now cleared to begin Lovenox and that drain could also be removed.  Dr. Waymond Hailey to discuss transitioning to Eliquis on discharge.

## 2023-11-08 NOTE — Evaluation (Addendum)
 Occupational Therapy Assessment and Plan  Patient Details  Name: Kristina Long MRN: [id number] Date of Birth: [birth year]  OT Diagnosis: acute pain, lumbago (low back pain), muscle weakness (generalized), and decreased activity tolerance Rehab Potential: Rehab Potential (ACUTE ONLY): Good ELOS: 7-10 days   Today's Date: 11/08/2023 OT Individual Time: 4098-1191 OT Individual Time Calculation (min): 70 min     Hospital Problem: Principal Problem:   Epidural abscess   Past Medical History:  Past Medical History:  Diagnosis Date   Allergy    Diabetes mellitus without complication (HCC)    Past Surgical History:  Past Surgical History:  Procedure Laterality Date   LUMBAR LAMINECTOMY FOR EPIDURAL ABSCESS N/A 10/29/2023   Procedure: LUMBAR LAMINECTOMY FOR EPIDURAL ABSCESS;  Surgeon: Joaquin Mulberry, MD;  Location[facility];  Service: Neurosurgery;  Laterality: N/A;   LUMBAR WOUND DEBRIDEMENT N/A 11/05/2023   Procedure: LUMBAR WOUND DEBRIDEMENT;  Surgeon: Joaquin Mulberry, MD;  Location: [facility] OR;  Service: Neurosurgery;  Laterality: N/A;  IRRIGATION AND DEBRIDEMENT LUMBAR WOUND   TRANSESOPHAGEAL ECHOCARDIOGRAM (CATH LAB) N/A 11/01/2023   Procedure: TRANSESOPHAGEAL ECHOCARDIOGRAM;  Surgeon: Jerryl Morin, DO;  Location: [facility] INVASIVE CV LAB;  Service: Cardiovascular;  Laterality: N/A;    Assessment & Plan Clinical Impression: Patient is a 68 year old right-handed female with history significant for hypertension, diabetes mellitus, hyperlipidemia.  Per chart review patient lives with spouse.  1 level home one-step to entry.  Independent prior to admission.  Presented 10/26/2023 with progressive worsening low back pain and right flank pain.  Patient was seen in the ED 4/28 and 4/30 for same complaints and was discharged to home and at that time she had a low-grade fever of 100 per her husband.  CT scan renal showed no hydronephrosis and she was provided with a muscle relaxer.  She now presented back to  the ED 5/2 with progressive back pain.  She reportedly accidentally took 8 (200 mg) of ibuprofen and 2 oxycodone  and her muscle relaxer.  Patient was noted to have altered mental status.  Admission labs 10/26/2023 showed a WBC of 16,500 with left shift, glucose 310, total bilirubin 1.3, hemoglobin A1c 8.7.  She was initially unable to tolerate MRI.  Lumbar spine films 10/27/2023 showed no acute osseous abnormality.  She did have some spondylosis at L4-5 and L5-S1.  MRI lumbar and thoracic spine completed showing heterogeneous posterior epidural collection extending from T11-T12 to L4-5, measuring up to 1.4 cm in thickness at L1-L2.  Resulting in severe canal stenosis at T12-L1, L1-2 and L2-3.  Neurosurgery Dr. Waymond Hailey consulted patient underwent decompressive lumbar laminectomy, medial facetectomy and foraminotomies T12-L1, L1-L2, L2-L3 for evacuation of epidural abscess 10/29/2023.  Blood and abscess culture with Staphylococcus pneumonia and infectious disease consulted initially placed on ceftriaxone .  Repeat blood cultures 10/30/2023 no growth to date.  TEE 5/9 revealed mitral valve endocarditis with mobile mass (1.5 cm x 0.482 cm) on the anterior mitral valve leaflet..  Cardiothoracic surgery team consulted Dr. Luna Salinas advised to continue IV antibiotics and follow-up outpatient.  Patient with persistent wound drainage after initial surgery underwent lumbar reexploration with blunt and sharp debridement of soft tissues followed by irrigation of the tissues and primary closure 11/05/2023 per Dr. Waymond Hailey.  Plan is currently to remain on IV ceftriaxone  2 g intravenously daily x 6 weeks using 10/30/18/2025 as day 1.  Leukocytosis improved 10,900 as well as mental status back to baseline.  She reports her lower back pain is overall under control.  Reports no  difficulty emptying her bladder.  Reports last bowel movement was 1 to 2 days ago, continent.  Therapy evaluations completed due to patient decreased functional  mobility was admitted for a comprehensive rehab program. Patient transferred to CIR on 11/07/2023 .    Patient currently requires min with basic self-care skills secondary to muscle weakness, decreased cardiorespiratoy endurance, decreased awareness and decreased memory, and decreased balance strategies and difficulty maintaining precautions.  Prior to hospitalization, patient could complete BADLs/IADLs independently.   Patient will benefit from skilled intervention to increase independence with basic self-care skills and increase level of independence with iADL prior to discharge home with care partner.  Anticipate patient will require 24/7 supervision and follow up outpatient.  OT - End of Session Activity Tolerance: Tolerates 10 - 20 min activity with multiple rests Endurance Deficit: Yes OT Assessment Rehab Potential (ACUTE ONLY): Good OT Barriers to Discharge: IV antibiotics;Wound Care OT Patient demonstrates impairments in the following area(s): Balance;Cognition;Endurance;Pain;Perception;Safety;Skin Integrity OT Basic ADL's Functional Problem(s): Bathing;Dressing;Toileting OT Advanced ADL's Functional Problem(s): Simple Meal Preparation;Light Housekeeping OT Transfers Functional Problem(s): Toilet;Tub/Shower OT Plan OT Intensity: Minimum of 1-2 x/day, 45 to 90 minutes OT Frequency: 5 out of 7 days OT Duration/Estimated Length of Stay: 7-10 days OT Treatment/Interventions: Balance/vestibular training;Cognitive remediation/compensation;Community reintegration;Discharge planning;Disease mangement/prevention;DME/adaptive equipment instruction;Neuromuscular re-education;Functional mobility training;Functional electrical stimulation;Pain management;Patient/family education;Psychosocial support;Splinting/orthotics;Skin care/wound managment;Self Care/advanced ADL retraining;Therapeutic Activities;Therapeutic Exercise;UE/LE Strength taining/ROM;Visual/perceptual remediation/compensation;UE/LE  Coordination activities OT Basic Self-Care Anticipated Outcome(s): Supervision OT Toileting Anticipated Outcome(s): Supervision OT Bathroom Transfers Anticipated Outcome(s): Supervision OT Recommendation Patient destination: Home Follow Up Recommendations: Outpatient OT Equipment Recommended: To be determined   OT Evaluation Precautions/Restrictions  Precautions Precautions: Back;Fall;Other (comment) Precaution Booklet Issued: No Recall of Precautions/Restrictions: Intact Precaution/Restrictions Comments: Pt able to recall BLT; Drain; RUE PICC Restrictions Weight Bearing Restrictions Per Provider Order: No General Chart Reviewed: Yes Family/Caregiver Present: No Pain Pain Assessment Pain Scale: 0-10 Pain Score: 1  Pain Type: Surgical pain Pain Location: Back Pain Intervention(s): Medication (See eMAR);Rest Home Living/Prior Functioning Home Living Family/patient expects to be discharged to:: Private residence Living Arrangements: Spouse/significant other Available Help at Discharge: Family, Available PRN/intermittently (Patient's husband able to take time off, but planning to go back to work full-time.) Type of Home: House Home Access: Stairs to enter Secretary/administrator of Steps: 1 Entrance Stairs-Rails: None Home Layout: One level Bathroom Shower/Tub: Psychologist, counselling, Tub/shower unit (Built in bench in walk-in shower, grab bars in both showers.) Bathroom Toilet: Administrator Accessibility: Yes  Lives With: Spouse IADL History Homemaking Responsibilities: Yes Current License: Yes Occupation: Agricultural consultant work Leisure and Hobbies: Attends classes at [employer] on x2/week IADL Comments: Performs simple meal prep, eats out. Prior Function Level of Independence: Independent with gait, Independent with transfers, Independent with basic ADLs, Independent with homemaking with ambulation  Able to Take Stairs?: Yes Driving: Yes Vocation: Volunteer work Leisure:  Hobbies-yes (Comment) (working out/exercise classes, volunteering with church) Vision Baseline Vision/History: 1 Wears glasses (Intermediately) Ability to See in Adequate Light: 1 Impaired Patient Visual Report: Blurring of vision Vision Assessment?: Wears glasses for driving Additional Comments: pt reports she feels her vision may have changed, but doesn't have current glasses Perception  Perception: Within Functional Limits Praxis Praxis: WFL Cognition Cognition Overall Cognitive Status: No family/caregiver present to determine baseline cognitive functioning Arousal/Alertness: Awake/alert Orientation Level: Person;Place;Situation Memory: Impaired Memory Impairment: Retrieval deficit;Decreased recall of new information Awareness: Impaired Awareness Impairment: Intellectual impairment Problem Solving: Impaired Problem Solving Impairment: Functional complex Safety/Judgment: Impaired Comments: Decreased carryover of back precautions. Brief Interview  for Mental Status (BIMS) Repetition of Three Words (First Attempt): 3 Temporal Orientation: Year: Correct Temporal Orientation: Month: Accurate within 5 days Temporal Orientation: Day: Correct Recall: "Sock": Yes, no cue required Recall: "Blue": Yes, no cue required Recall: "Bed": Yes, no cue required BIMS Summary Score: 15 Sensation Sensation Light Touch: Impaired Detail Peripheral sensation comments: Decreased sensation in B feet when in cold temperatures, improved since transferring to CIR. Light Touch Impaired Details: Impaired RLE;Impaired LLE Hot/Cold: Appears Intact Proprioception: Appears Intact Additional Comments: provided education on skin checks daily Coordination Gross Motor Movements are Fluid and Coordinated: No Coordination and Movement Description: R>L Motor  Motor Motor: Within Functional Limits  Trunk/Postural Assessment  Cervical Assessment Cervical Assessment: Within Functional Limits Thoracic  Assessment Thoracic Assessment: Within Functional Limits Lumbar Assessment Lumbar Assessment: Within Functional Limits Postural Control Postural Control: Deficits on evaluation Righting Reactions: Decreased Protective Responses: Decreased  Balance Balance Balance Assessed: Yes Standardized Balance Assessment Standardized Balance Assessment: Berg Balance Test;Timed Up and Go Test Berg Balance Test Sit to Stand: Able to stand  independently using hands Standing Unsupported: Able to stand 2 minutes with supervision Sitting with Back Unsupported but Feet Supported on Floor or Stool: Able to sit safely and securely 2 minutes Stand to Sit: Controls descent by using hands Transfers: Able to transfer safely, definite need of hands Standing Unsupported with Eyes Closed: Able to stand 10 seconds with supervision Standing Ubsupported with Feet Together: Able to place feet together independently and stand 1 minute safely From Standing, Reach Forward with Outstretched Arm: Can reach forward >12 cm safely (5") From Standing Position, Pick up Object from Floor: Able to pick up shoe, needs supervision From Standing Position, Turn to Look Behind Over each Shoulder: Turn sideways only but maintains balance (back precautions) Turn 360 Degrees: Able to turn 360 degrees safely but slowly Standing Unsupported, Alternately Place Feet on Step/Stool: Able to stand independently and safely and complete 8 steps in 20 seconds Standing Unsupported, One Foot in Front: Able to place foot tandem independently and hold 30 seconds Standing on One Leg: Able to lift leg independently and hold > 10 seconds Total Score: 45 Extremity/Trunk Assessment RUE Assessment RUE Assessment: Within Functional Limits LUE Assessment LUE Assessment: Within Functional Limits  Care Tool Care Tool Self Care Eating   Eating Assist Level: Independent    Oral Care    Oral Care Assist Level: Supervision/Verbal cueing    Bathing    Body parts bathed by patient: Right arm;Left arm;Chest;Abdomen;Front perineal area;Right upper leg;Left upper leg;Face Body parts bathed by helper: Right lower leg;Left lower leg;Buttocks   Assist Level: Minimal Assistance - Patient > 75%    Upper Body Dressing(including orthotics)   What is the patient wearing?: Pull over shirt   Assist Level: Supervision/Verbal cueing    Lower Body Dressing (excluding footwear)   What is the patient wearing?: Underwear/pull up;Pants Assist for lower body dressing: Minimal Assistance - Patient > 75%    Putting on/Taking off footwear   What is the patient wearing?: Socks;Shoes Assist for footwear: Moderate Assistance - Patient 50 - 74%       Care Tool Toileting Toileting activity   Assist for toileting: Minimal Assistance - Patient > 75%     Care Tool Bed Mobility Roll left and right activity        Sit to lying activity        Lying to sitting on side of bed activity         Care Tool  Transfers Sit to stand transfer        Chair/bed transfer         Toilet transfer   Assist Level: Contact Guard/Touching assist     Care Tool Cognition  Expression of Ideas and Wants Expression of Ideas and Wants: 4. Without difficulty (complex and basic) - expresses complex messages without difficulty and with speech that is clear and easy to understand  Understanding Verbal and Non-Verbal Content Understanding Verbal and Non-Verbal Content: 4. Understands (complex and basic) - clear comprehension without cues or repetitions   Memory/Recall Ability Memory/Recall Ability : That he or she is in a hospital/hospital unit;Location of own room   Refer to Care Plan for Long Term Goals  SHORT TERM GOAL WEEK 1 OT Short Term Goal 1 (Week 1): STGs=LTGs due to patient's estimated length of stay.  Recommendations for other services: None    Skilled Therapeutic Intervention Session began with introduction to OT role, OT POC, and general orientation to  rehab unit/schedule. Pt received already dressed for the day, levels of assistance noted below based on simulated movements. Pt performs room-level mobility/transfers with CGA progressing to close supervision + RW. In main therapy gym, pt instructed in med management activity. Pt completes within more than reasonable amount of time, ~8 mins, requiring mod-max A to identify/correct errors made when organizing medications in pill-box. 9-hole peg test then administered, results R=~33 secs & L=~28 secs. Pt does requires intermediate cuing for back precaution adherence. Pt remained resting in bed with alarm activated and all immediate needs met.   ADL ADL Eating: Independent Where Assessed-Eating: Edge of bed Grooming: Supervision/safety Where Assessed-Grooming: Standing at sink Upper Body Bathing: Supervision/safety;Setup Where Assessed-Upper Body Bathing: Chair Lower Body Bathing: Minimal assistance;Contact guard Where Assessed-Lower Body Bathing: Chair Upper Body Dressing: Supervision/safety;Setup Where Assessed-Upper Body Dressing: Chair Lower Body Dressing: Minimal assistance Where Assessed-Lower Body Dressing: Chair Toileting: Minimal assistance Where Assessed-Toileting: Toilet;Bedside Commode Toilet Transfer: Close supervision;Contact guard Toilet Transfer Method: Proofreader: Gaffer: Unable to assess Optician, dispensing Method: Designer, industrial/product: Scientist, research (medical) Sit to Stand: Contact Guard/Touching assist;Supervision/Verbal cueing Stand to Sit: Contact Guard/Touching assist;Supervision/Verbal cueing   Discharge Criteria: Patient will be discharged from OT if patient refuses treatment 3 consecutive times without medical reason, if treatment goals not met, if there is a change in medical status, if patient makes no progress towards  goals or if patient is discharged from hospital.  The above assessment, treatment plan, treatment alternatives and goals were discussed and mutually agreed upon: by patient  Artemus Biles, OTR/L, MSOT  11/08/2023, 11:17 AM

## 2023-11-09 LAB — GLUCOSE, CAPILLARY
Glucose-Capillary: 150 mg/dL — ABNORMAL HIGH (ref 70–99)
Glucose-Capillary: 75 mg/dL (ref 70–99)
Glucose-Capillary: 192 mg/dL — ABNORMAL HIGH (ref 70–99)

## 2023-11-09 MED ORDER — INSULIN STARTER KIT- PEN NEEDLES (ENGLISH)
1.0000 | Freq: Once | Status: DC
  Filled 2023-11-09 (×2): qty 1

## 2023-11-09 MED ORDER — LIVING WELL WITH DIABETES BOOK - IN SPANISH
Freq: Once | Status: DC
  Filled 2023-11-09: qty 1

## 2023-11-09 NOTE — IPOC Note (Signed)
 Overall Plan of Care [facility]) Patient Details Name: Kristina Long MRN: [id number] DOB: 12-10-1955  Admitting Diagnosis: Epidural abscess  Hospital Problems: Principal Problem:   Epidural abscess     Functional Problem List: Nursing Bladder, Bowel, Edema, Endurance, Medication Management, Pain, Safety, Skin Integrity  PT Balance, Pain, Endurance, Sensory  OT Balance, Cognition, Endurance, Pain, Perception, Safety, Skin Integrity  SLP Cognition  TR         Basic ADL's: OT Bathing, Dressing, Toileting     Advanced  ADL's: OT Simple Meal Preparation, Light Housekeeping     Transfers: PT Car, Bed to Chair, Bed Mobility  OT Toilet, Tub/Shower     Locomotion: PT Ambulation, Stairs     Additional Impairments: OT    SLP Social Cognition   Awareness, Problem Solving  TR      Anticipated Outcomes Item Anticipated Outcome  Self Feeding    Swallowing      Basic self-care  Supervision  Toileting  Supervision   Bathroom Transfers Supervision  Bowel/Bladder  manage bowels with medications/ manage bladder with toileting assistance  Transfers  supervision  Locomotion  supervision  Communication     Cognition  mod i A  Pain  <4 w/ prns  Safety/Judgment  manage safety with minimal assistance   Therapy Plan: PT Intensity: Minimum of 1-2 x/day ,45 to 90 minutes PT Frequency: 5 out of 7 days PT Duration Estimated Length of Stay: 7-10 days OT Intensity: Minimum of 1-2 x/day, 45 to 90 minutes OT Frequency: 5 out of 7 days OT Duration/Estimated Length of Stay: 7-10 days SLP Intensity: Minumum of 1-2 x/day, 30 to 90 minutes SLP Frequency: 1 to 3 out of 7 days SLP Duration/Estimated Length of Stay: 7-10 days   Team Interventions: Nursing Interventions Patient/Family Education, Medication Management, Bladder Management, Bowel Management, Disease Management/Prevention, Pain Management, Skin Care/Wound Management  PT interventions Ambulation/gait training, Discharge  planning, Cognitive remediation/compensation, DME/adaptive equipment instruction, Functional mobility training, Pain management, Psychosocial support, Splinting/orthotics, Therapeutic Activities, UE/LE Strength taining/ROM, Visual/perceptual remediation/compensation, Wheelchair propulsion/positioning, UE/LE Coordination activities, Therapeutic Exercise, Stair training, Skin care/wound management, Patient/family education, Neuromuscular re-education, Functional electrical stimulation, Disease management/prevention, Firefighter, Warden/ranger  OT Interventions Warden/ranger, Cognitive remediation/compensation, Firefighter, Discharge planning, Disease mangement/prevention, Fish farm manager, Neuromuscular re-education, Functional mobility training, Functional electrical stimulation, Pain management, Patient/family education, Psychosocial support, Splinting/orthotics, Skin care/wound managment, Self Care/advanced ADL retraining, Therapeutic Activities, Therapeutic Exercise, UE/LE Strength taining/ROM, Visual/perceptual remediation/compensation, UE/LE Coordination activities  SLP Interventions Cognitive remediation/compensation, Functional tasks, Patient/family education, Therapeutic Activities  TR Interventions    SW/CM Interventions Discharge Planning, Psychosocial Support, Patient/Family Education   Barriers to Discharge MD  Medical stability, Home enviroment access/loayout, IV antibiotics, Lack of/limited family support, Weight, and Weight bearing restrictions  Nursing Decreased caregiver support, Home environment access/layout, IV antibiotics, Neurogenic Bowel & Bladder, Wound Care Discharge: House  Discharge Home Layout: One level  Discharge Home Access: Level entry  PT Home environment access/layout, Wound Care    OT IV antibiotics, Wound Care    SLP      SW Decreased caregiver support, Lack of/limited family support, IV  antibiotics     Team Discharge Planning: Destination: PT-Home ,OT- Home , SLP-Home Projected Follow-up: PT-Outpatient PT, OT-  Outpatient OT, SLP-None Projected Equipment Needs: PT-To be determined, OT- To be determined, SLP-To be determined Equipment Details: PT- , OT-  Patient/family involved in discharge planning: PT- Patient,  OT-Patient, SLP-Patient  MD ELOS: 7-10 days  Medical Rehab Prognosis:  Good Assessment: The patient has  been admitted for CIR therapies with the diagnosis of epidural abscess. The team will be addressing functional mobility, strength, stamina, balance, safety, adaptive techniques and equipment, self-care, bowel and bladder mgt, patient and caregiver education, DVT and IV ABX. Goals have been set at supervision. Anticipated discharge destination is home.        See Team Conference Notes for weekly updates to the plan of care

## 2023-11-09 NOTE — Progress Notes (Signed)
 PHARMACY CONSULT NOTE FOR:  OUTPATIENT  PARENTERAL ANTIBIOTIC THERAPY (OPAT)  Indication: epidural abscess and MV endocarditis Regimen: ceftriaxone  2g IV q24h End date: 12/11/2023  IV antibiotic discharge orders are pended. To discharging provider:  please sign these orders via discharge navigator,  Select New Orders & click on the button choice - Manage This Unsigned Work.     Thank you for allowing pharmacy to be a part of this patient's care.  Sherre Docker 11/09/2023, 3:26 PM

## 2023-11-09 NOTE — Progress Notes (Signed)
 PROGRESS NOTE   Subjective/Complaints:  Pt reports  they got hemovac out- explained to her that has acute DVTs and "treated her yesterday" Explained that she will be on treatment for 3 months-   Is on bedrest today for 24 hours.  Loves therapy and the therapists.  LBM yesterday.    ROS:   Pt denies SOB, abd pain, CP, N/V/C/D, and vision changes    Objective:   VAS US  LOWER EXTREMITY VENOUS (DVT) Result Date: 11/08/2023  Lower Venous DVT Study Patient Name:  Kristina Long  Date of Exam:   11/08/2023 Medical Rec #: [id number]       Accession #:    [id number] Date of Birth: 08/07/1955      Patient Gender: F Patient Age:   68 years Exam Location:  [facility] Procedure:      VAS US  LOWER EXTREMITY VENOUS (DVT) Referring Phys: Georjean Kite --------------------------------------------------------------------------------  Indications: Edema. 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 Status                    post decompressive lumbar laminectomy, medial facetectomy and                    foraminotomies from T12-L3 with evacuation of epidural                    abscess 10/29/2023 as well as lumbar reexploration for wound                    drainage with blunt and sharp debridement of soft tissue                    followed by irrigation of the tissue 11/05/2023. Risk Factors: Immobility. Limitations: Patient somnolent and unable to follow directives. Comparison Study: No prior study on file Performing Technologist: Carleene Chase RVS  Examination Guidelines: A complete evaluation includes B-mode imaging, spectral Doppler, color Doppler, and power Doppler as needed of all accessible portions of each vessel. Bilateral testing is considered an integral part of a  complete examination. Limited examinations for reoccurring indications may be performed as noted. The reflux portion of the exam is performed with the patient in reverse Trendelenburg.  +---------+---------------+---------+-----------+----------+--------------+ RIGHT    CompressibilityPhasicitySpontaneityPropertiesThrombus Aging +---------+---------------+---------+-----------+----------+--------------+ CFV      Full           Yes      Yes                                 +---------+---------------+---------+-----------+----------+--------------+ SFJ      Full                                                        +---------+---------------+---------+-----------+----------+--------------+  FV Prox  Full                                                        +---------+---------------+---------+-----------+----------+--------------+ FV Mid   Full           Yes      Yes                                 +---------+---------------+---------+-----------+----------+--------------+ FV DistalFull                                                        +---------+---------------+---------+-----------+----------+--------------+ PFV      Full                                                        +---------+---------------+---------+-----------+----------+--------------+ POP      Full           Yes      Yes                                 +---------+---------------+---------+-----------+----------+--------------+ PTV      None                                         Acute          +---------+---------------+---------+-----------+----------+--------------+ PERO     None                                         Acute          +---------+---------------+---------+-----------+----------+--------------+   +---------+---------------+---------+-----------+----------+--------------+ LEFT     CompressibilityPhasicitySpontaneityPropertiesThrombus Aging  +---------+---------------+---------+-----------+----------+--------------+ CFV      Full           Yes      Yes                                 +---------+---------------+---------+-----------+----------+--------------+ SFJ      Full                                                        +---------+---------------+---------+-----------+----------+--------------+ FV Prox  Full                                                        +---------+---------------+---------+-----------+----------+--------------+  FV Mid   Full                                                        +---------+---------------+---------+-----------+----------+--------------+ FV DistalFull                                                        +---------+---------------+---------+-----------+----------+--------------+ PFV      Full                                                        +---------+---------------+---------+-----------+----------+--------------+ POP      Full           Yes      Yes                                 +---------+---------------+---------+-----------+----------+--------------+ PTV                                                   Acute          +---------+---------------+---------+-----------+----------+--------------+ PERO                                                  Acute          +---------+---------------+---------+-----------+----------+--------------+     Summary: RIGHT: - Findings consistent with acute deep vein thrombosis involving the right posterior tibial veins, and right peroneal veins in the mid calf.  - No cystic structure found in the popliteal fossa.  LEFT: - Findings consistent with acute deep vein thrombosis involving the left posterior tibial veins, and left peroneal veins in the mid calf.   *See table(s) above for measurements and observations. Electronically signed by Kristina Long on 11/08/2023 at 6:13:16 PM.    Final    Recent  Labs    11/07/23 0314 11/08/23 0550  WBC 10.9* 10.2  HGB 9.2* 10.0*  HCT 29.5* 30.9*  PLT 447* 460*   Recent Labs    11/07/23 0314 11/08/23 0550  NA 136 138  K 4.2 4.4  CL 101 103  CO2 24 28  GLUCOSE 160* 159*  BUN 25* 18  CREATININE 0.84 0.74  CALCIUM  9.4 9.7    Intake/Output Summary (Last 24 hours) at 11/09/2023 0913 Last data filed at 11/09/2023 0800 Gross per 24 hour  Intake 358 ml  Output --  Net 358 ml        Physical Exam: Vital Signs Blood pressure 107/75, pulse 90, temperature 98.2 F (36.8 C), resp. rate 18, height 5\' 10"  (1.778 m), weight 85.6 kg, SpO2 98%.     General: awake, alert, appropriate,  sitting up in bedside chair; NAD  HENT: conjugate gaze; oropharynx moist CV: regular rate and rhythm; no JVD Pulmonary: CTA B/L; no W/R/R- good air movement GI: soft, NT, ND, (+)BS Psychiatric: appropriate Neurological: Ox3  Skin: L-spine incision with dry dressing that's been reinforced, but it's C/D/I- pt c/o itching and can see scratch marks.  Neuro:     Mental Status: AAOx3 Speech/Languate:  fluent, follows simple commands CRANIAL NERVES: 2 through 12 grossly intact     MOTOR: RUE: 5/5 Deltoid, 5/5 Biceps, 5/5 Triceps,5/5 Grip LUE: 5/5 Deltoid, 5/5 Biceps, 5/5 Triceps, 5/5 Grip RLE: HF 4/5, KE 4+/5, ADF 5-/5, APF 5-/5 LLE: HF 4/5, KE 4+/5, ADF 5-/5, APF 5-/5     SENSORY: Normal to touch all 4 extremities, patient reports altered sensation in distal toes      Assessment/Plan: 1. Functional deficits which require 3+ hours per day of interdisciplinary therapy in a comprehensive inpatient rehab setting. Physiatrist is providing close team supervision and 24 hour management of active medical problems listed below. Physiatrist and rehab team continue to assess barriers to discharge/monitor patient progress toward functional and medical goals  Care Tool:  Bathing    Body parts bathed by patient: Right arm, Left arm, Chest, Abdomen, Front  perineal area, Right upper leg, Left upper leg, Face   Body parts bathed by helper: Right lower leg, Left lower leg, Buttocks     Bathing assist Assist Level: Minimal Assistance - Patient > 75%     Upper Body Dressing/Undressing Upper body dressing   What is the patient wearing?: Pull over shirt    Upper body assist Assist Level: Supervision/Verbal cueing    Lower Body Dressing/Undressing Lower body dressing      What is the patient wearing?: Underwear/pull up, Pants     Lower body assist Assist for lower body dressing: Minimal Assistance - Patient > 75%     Toileting Toileting    Toileting assist Assist for toileting: Minimal Assistance - Patient > 75%     Transfers Chair/bed transfer  Transfers assist     Chair/bed transfer assist level: Contact Guard/Touching assist     Locomotion Ambulation   Ambulation assist      Assist level: Contact Guard/Touching assist Assistive device: No Device Max distance: 500   Walk 10 feet activity   Assist     Assist level: Contact Guard/Touching assist Assistive device: No Device   Walk 50 feet activity   Assist    Assist level: Contact Guard/Touching assist Assistive device: No Device    Walk 150 feet activity   Assist    Assist level: Contact Guard/Touching assist Assistive device: No Device    Walk 10 feet on uneven surface  activity   Assist     Assist level: Contact Guard/Touching assist     Wheelchair     Assist Is the patient using a wheelchair?: No             Wheelchair 50 feet with 2 turns activity    Assist            Wheelchair 150 feet activity     Assist          Blood pressure 107/75, pulse 90, temperature 98.2 F (36.8 C), resp. rate 18, height 5\' 10"  (1.778 m), weight 85.6 kg, SpO2 98%.  Medical Problem List and Plan: 1. Functional deficits secondary to strep pneumonaie MV endocarditis with epidural abscess extending from T11-L5 with  resultant severe canal stenosis at L1-2 and L2-3 and moderate canal stenosis at L3-4.  Status post decompressive lumbar laminectomy, medial facetectomy and foraminotomies from T12-L3 with evacuation of epidural abscess 10/29/2023 as well as lumbar reexploration for wound drainage with blunt and sharp debridement of soft tissue followed by irrigation of the tissue 11/05/2023.NO BRACE REQUIRED             -patient may shower if lumbar drain/incision can be kept dry             -ELOS/Goals: 12-14             -Con't CIR_ but on bedrest just for today since has DVT's 2.  Antithrombotics: -DVT/anticoagulation:  Mechanical: Antiembolism stockings, thigh (TED hose) Bilateral lower extremities 5/16- started on tx dose lovenox- will change to Eliquis next week for new DVTs- will need meds for 3 months- educated pt on this             -antiplatelet therapy: N/A 3. Pain Management: Celebrex  200 mg every 12 hours, Robaxin  and oxycodone  as needed  5/15- pt reports pain isn't bad- more itching 4. Mood/Behavior/Sleep: Provide emotional support             -antipsychotic agents: N/A 5. Neuropsych/cognition: This patient is capable of making decisions on her own behalf. 6. Skin/Wound Care: Routine skin checks 7. Fluids/Electrolytes/Nutrition: Routine in and outs with follow-up chemistries 8.  Mitral valve endocarditis.  IV Rocephin  2 g daily x 6 weeks USING 10/30/2023 AS DAY #1 Ending 12/11/2023 .  Follow-up infectious disease Dr. Artemio Larry as well as outpatient CVTS Dr. Luna Salinas.  Patient has PICC line 9.  Leukocytosis/acute blood loss anemia.  Follow-up CBC  5/15- WBC down to 10. 2 and Hb up to 10k 10.  Hypertension.  Norvasc  10 mg daily, lisinopril  5 mg daily.  Monitor with increased mobility             - Hospitalist recommends stop amlodipine  first if BP soft  5/15- BP looks better- con't regimen 11.  Diabetes mellitus.  Hemoglobin A1c 8.7.  Currently on NovoLog  7 units 3 times daily, Semglee  30 units daily,  Tradjenta  5 mg daily.  Patient on Glucophage  1000 mg twice daily and Januvia  100 mg daily prior to admission.  Monitor with increased activity  5/15- CBGs 102-202- will monitor trend for 24 hours before changing anything  5/16- CBGs 75-1523 doing better- con't regimen 12.  Hyperlipidemia.  Lipitor 13.  Acute encephalopathy/delirium.  Resolved. 14.  Proteinuria.  Continue lisinopril  15. Constipation             Scheduled MiraLAX , continue Senokot  5/15- pt asked for Colace- ordered for pt.   5/16- LBM 2 days ago    I spent a total of  45  minutes on total care today- >50% coordination of care- due to   D/w and education of pt and nursing on DVT's- d/w therapy to put on bedrest today and PA.   LOS: 2 days A FACE TO FACE EVALUATION WAS PERFORMED  Kristina Long 11/09/2023, 9:13 AM

## 2023-11-09 NOTE — Progress Notes (Signed)
 Physical Therapy Session Note  Patient Details  Name: Kristina Long MRN: [id number] Date of Birth: 06/23/1956  Today's Date: 11/09/2023 PT Missed Time: 120 Minutes Missed Time Reason: MD hold (Comment) (DVT)  Short Term Goals: Week 1:  PT Short Term Goal 1 (Week 1): =LTGs d/t ELOS  Skilled Therapeutic Interventions/Progress Updates:    Pt on bed rest d/t BIL DVT. Missed x 120 minutes of scheduled therapy on this date. Will attempt to make up as able.   Therapy Documentation Precautions:  Precautions Precautions: Back, Fall, Other (comment) Precaution Booklet Issued: No Recall of Precautions/Restrictions: Intact Precaution/Restrictions Comments: Pt able to recall BLT; Drain; RUE PICC Restrictions Weight Bearing Restrictions Per Provider Order: No General: PT Amount of Missed Time (min): 120 Minutes PT Missed Treatment Reason: MD hold (Comment) (DVT)     Therapy/Group: Individual Therapy  Tex Filbert 11/09/2023, 8:55 AM

## 2023-11-09 NOTE — Plan of Care (Signed)
  Problem: Consults Goal: RH SPINAL CORD INJURY PATIENT EDUCATION Description:  See Patient Education module for education specifics.  Outcome: Progressing   Problem: SCI BOWEL ELIMINATION Goal: RH STG MANAGE BOWEL WITH ASSISTANCE Description: STG Manage Bowel with supervision Assistance. Outcome: Progressing Goal: RH STG SCI MANAGE BOWEL WITH MEDICATION WITH ASSISTANCE Description: STG SCI Manage bowel with medication with supervision assistance. Outcome: Progressing   Problem: SCI BLADDER ELIMINATION Goal: RH STG MANAGE BLADDER WITH ASSISTANCE Description: STG Manage Bladder With minimal assistance Assistance Outcome: Progressing   Problem: RH SKIN INTEGRITY Goal: RH STG SKIN FREE OF INFECTION/BREAKDOWN Description: Manage skin free of infection/breakdown with minimal assistance Outcome: Progressing   Problem: RH SAFETY Goal: RH STG ADHERE TO SAFETY PRECAUTIONS W/ASSISTANCE/DEVICE Description: STG Adhere to Safety Precautions With minimal  Assistance/Device. Outcome: Progressing   Problem: RH PAIN MANAGEMENT Goal: RH STG PAIN MANAGED AT OR BELOW PT'S PAIN GOAL Description: <4 w/prns Outcome: Progressing   Problem: RH KNOWLEDGE DEFICIT SCI Goal: RH STG INCREASE KNOWLEDGE OF SELF CARE AFTER SCI Description: Manage increase knowledge of  self care after SCI with minimal assistance using educational materials provided  Outcome: Progressing

## 2023-11-09 NOTE — Progress Notes (Signed)
 Speech Language Pathology Daily Session Note  Patient Details  Name: Kristina Long MRN: [id number] Date of Birth: May 08, 1956  Today's Date: 11/09/2023 SLP Individual Time: 1450-1532 SLP Individual Time Calculation (min): 42 min  Short Term Goals: Week 1: SLP Short Term Goal 1 (Week 1): STG = LTG due to ELOS  Skilled Therapeutic Interventions: SLP conducted skilled therapy session targeting cognitive retraining goals. SLP first administered money math task where patient was tasked with adding bills and coins to determine total sum. Patient benefited from cues to slow down and scan thoroughly. She completed the task with 100% accuracy given min assist overall. SLP then facilitated sustained attention/scanning task, with patient incorporating slow down/scanning strategy from previous task, this time benefiting from min cues fading to supervision by the end of the task. Patient then completed basic calendar-based problem solving task with supervision. Patient was left in room with call bell in reach and alarm set. SLP will continue to target goals per plan of care.        Pain  None endorsed   Therapy/Group: Individual Therapy  Kelsy Polack, M.A., CCC-SLP  Virgil Lightner A Jihan Rudy 11/09/2023, 6:14 PM

## 2023-11-10 DIAGNOSIS — R739 Hyperglycemia, unspecified: Secondary | ICD-10-CM

## 2023-11-10 DIAGNOSIS — K5901 Slow transit constipation: Secondary | ICD-10-CM

## 2023-11-10 LAB — GLUCOSE, CAPILLARY
Glucose-Capillary: 174 mg/dL — ABNORMAL HIGH (ref 70–99)
Glucose-Capillary: 171 mg/dL — ABNORMAL HIGH (ref 70–99)
Glucose-Capillary: 121 mg/dL — ABNORMAL HIGH (ref 70–99)
Glucose-Capillary: 151 mg/dL — ABNORMAL HIGH (ref 70–99)

## 2023-11-10 NOTE — Progress Notes (Signed)
 PROGRESS NOTE   Subjective/Complaints:  Pt doing well, slept ok, pain well managed, LBM yesterday per pt (not documented since 5/14), urinating fine. Had some tremors? While IV was going, for about this morning-- thought it was from Lovenox  but now thinks it's from IV fluids going in. Only happened while IV was running. No other associated symptoms.  No other complaints or concerns.    ROS:   Pt denies SOB, abd pain, CP, N/V/C/D, and vision changes    Objective:   VAS US  LOWER EXTREMITY VENOUS (DVT) Result Date: 11/08/2023  Lower Venous DVT Study Patient Name:  [facility]  Date of Exam:   11/08/2023 Medical Rec #: [id number]       Accession #:    [id number] Date of Birth: 23-Jun-1956      Patient Gender: F Patient Age:   68 years Exam Location:  [facility] Procedure:      VAS US  LOWER EXTREMITY VENOUS (DVT) Referring Phys: Georjean Kite --------------------------------------------------------------------------------  Indications: Edema. 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 Status                    post decompressive lumbar laminectomy, medial facetectomy and                    foraminotomies from T12-L3 with evacuation of epidural                    abscess 10/29/2023 as well as lumbar reexploration for wound                    drainage with blunt and sharp debridement of soft tissue                    followed by irrigation of the tissue 11/05/2023. Risk Factors: Immobility. Limitations: Patient somnolent and unable to follow directives. Comparison Study: No prior study on file Performing Technologist: Carleene Chase RVS  Examination Guidelines: A complete evaluation includes B-mode imaging, spectral Doppler, color Doppler, and power  Doppler as needed of all accessible portions of each vessel. Bilateral testing is considered an integral part of a complete examination. Limited examinations for reoccurring indications may be performed as noted. The reflux portion of the exam is performed with the patient in reverse Trendelenburg.  +---------+---------------+---------+-----------+----------+--------------+ RIGHT    CompressibilityPhasicitySpontaneityPropertiesThrombus Aging +---------+---------------+---------+-----------+----------+--------------+ CFV      Full           Yes      Yes                                 +---------+---------------+---------+-----------+----------+--------------+ SFJ      Full                                                        +---------+---------------+---------+-----------+----------+--------------+  FV Prox  Full                                                        +---------+---------------+---------+-----------+----------+--------------+ FV Mid   Full           Yes      Yes                                 +---------+---------------+---------+-----------+----------+--------------+ FV DistalFull                                                        +---------+---------------+---------+-----------+----------+--------------+ PFV      Full                                                        +---------+---------------+---------+-----------+----------+--------------+ POP      Full           Yes      Yes                                 +---------+---------------+---------+-----------+----------+--------------+ PTV      None                                         Acute          +---------+---------------+---------+-----------+----------+--------------+ PERO     None                                         Acute          +---------+---------------+---------+-----------+----------+--------------+    +---------+---------------+---------+-----------+----------+--------------+ LEFT     CompressibilityPhasicitySpontaneityPropertiesThrombus Aging +---------+---------------+---------+-----------+----------+--------------+ CFV      Full           Yes      Yes                                 +---------+---------------+---------+-----------+----------+--------------+ SFJ      Full                                                        +---------+---------------+---------+-----------+----------+--------------+ FV Prox  Full                                                        +---------+---------------+---------+-----------+----------+--------------+  FV Mid   Full                                                        +---------+---------------+---------+-----------+----------+--------------+ FV DistalFull                                                        +---------+---------------+---------+-----------+----------+--------------+ PFV      Full                                                        +---------+---------------+---------+-----------+----------+--------------+ POP      Full           Yes      Yes                                 +---------+---------------+---------+-----------+----------+--------------+ PTV                                                   Acute          +---------+---------------+---------+-----------+----------+--------------+ PERO                                                  Acute          +---------+---------------+---------+-----------+----------+--------------+     Summary: RIGHT: - Findings consistent with acute deep vein thrombosis involving the right posterior tibial veins, and right peroneal veins in the mid calf.  - No cystic structure found in the popliteal fossa.  LEFT: - Findings consistent with acute deep vein thrombosis involving the left posterior tibial veins, and left peroneal veins in the mid  calf.   *See table(s) above for measurements and observations. Electronically signed by Delaney Fearing on 11/08/2023 at 6:13:16 PM.    Final    Recent Labs    11/08/23 0550  WBC 10.2  HGB 10.0*  HCT 30.9*  PLT 460*   Recent Labs    11/08/23 0550  NA 138  K 4.4  CL 103  CO2 28  GLUCOSE 159*  BUN 18  CREATININE 0.74  CALCIUM  9.7    Intake/Output Summary (Last 24 hours) at 11/10/2023 1327 Last data filed at 11/10/2023 0700 Gross per 24 hour  Intake 436.94 ml  Output --  Net 436.94 ml        Physical Exam: Vital Signs Blood pressure 122/63, pulse 78, temperature 98.3 F (36.8 C), resp. rate 17, height 5\' 10"  (1.778 m), weight 85.6 kg, SpO2 97%.     General: awake, alert, appropriate,  walking in room back to EOB; NAD HENT: conjugate gaze; oropharynx moist CV: regular rate and rhythm; no JVD Pulmonary: CTA B/L;  no W/R/R- good air movement GI: soft, NT, ND, (+)BS Psychiatric: appropriate, pleasant Neurological: Ox3, no tremors  PRIOR EXAMS: Skin: L-spine incision with dry dressing that's been reinforced, but it's C/D/I- pt c/o itching and can see scratch marks.  Neuro:     Mental Status: AAOx3 Speech/Languate:  fluent, follows simple commands CRANIAL NERVES: 2 through 12 grossly intact     MOTOR: RUE: 5/5 Deltoid, 5/5 Biceps, 5/5 Triceps,5/5 Grip LUE: 5/5 Deltoid, 5/5 Biceps, 5/5 Triceps, 5/5 Grip RLE: HF 4/5, KE 4+/5, ADF 5-/5, APF 5-/5 LLE: HF 4/5, KE 4+/5, ADF 5-/5, APF 5-/5     SENSORY: Normal to touch all 4 extremities, patient reports altered sensation in distal toes      Assessment/Plan: 1. Functional deficits which require 3+ hours per day of interdisciplinary therapy in a comprehensive inpatient rehab setting. Physiatrist is providing close team supervision and 24 hour management of active medical problems listed below. Physiatrist and rehab team continue to assess barriers to discharge/monitor patient progress toward functional and medical  goals  Care Tool:  Bathing    Body parts bathed by patient: Right arm, Left arm, Chest, Abdomen, Front perineal area, Right upper leg, Left upper leg, Face   Body parts bathed by helper: Right lower leg, Left lower leg, Buttocks     Bathing assist Assist Level: Minimal Assistance - Patient > 75%     Upper Body Dressing/Undressing Upper body dressing   What is the patient wearing?: Pull over shirt    Upper body assist Assist Level: Supervision/Verbal cueing    Lower Body Dressing/Undressing Lower body dressing      What is the patient wearing?: Underwear/pull up, Pants     Lower body assist Assist for lower body dressing: Minimal Assistance - Patient > 75%     Toileting Toileting    Toileting assist Assist for toileting: Minimal Assistance - Patient > 75%     Transfers Chair/bed transfer  Transfers assist     Chair/bed transfer assist level: Contact Guard/Touching assist     Locomotion Ambulation   Ambulation assist      Assist level: Contact Guard/Touching assist Assistive device: No Device Max distance: 500   Walk 10 feet activity   Assist     Assist level: Contact Guard/Touching assist Assistive device: No Device   Walk 50 feet activity   Assist    Assist level: Contact Guard/Touching assist Assistive device: No Device    Walk 150 feet activity   Assist    Assist level: Contact Guard/Touching assist Assistive device: No Device    Walk 10 feet on uneven surface  activity   Assist     Assist level: Contact Guard/Touching assist     Wheelchair     Assist Is the patient using a wheelchair?: No             Wheelchair 50 feet with 2 turns activity    Assist            Wheelchair 150 feet activity     Assist          Blood pressure 122/63, pulse 78, temperature 98.3 F (36.8 C), resp. rate 17, height 5\' 10"  (1.778 m), weight 85.6 kg, SpO2 97%.  Medical Problem List and Plan: 1. Functional  deficits secondary to strep pneumonaie MV endocarditis with epidural abscess extending from T11-L5 with resultant severe canal stenosis at L1-2 and L2-3 and moderate canal stenosis at L3-4.  Status post decompressive lumbar laminectomy, medial facetectomy and foraminotomies  from T12-L3 with evacuation of epidural abscess 10/29/2023 as well as lumbar reexploration for wound drainage with blunt and sharp debridement of soft tissue followed by irrigation of the tissue 11/05/2023. NO BRACE REQUIRED             -patient may shower if lumbar drain/incision can be kept dry             -ELOS/Goals: 12-14d             -Con't CIR_ but on bedrest just for today since has DVT's 2.  Antithrombotics: -DVT/anticoagulation:  Mechanical: Antiembolism stockings, thigh (TED hose) Bilateral lower extremities -5/16- started on tx dose lovenox  80mg  BID- will change to Eliquis next week for new DVTs- will need meds for 3 months- educated pt on this             -antiplatelet therapy: N/A 3. Pain Management: Celebrex  200 mg every 12 hours, Robaxin  and oxycodone  as needed  5/15- pt reports pain isn't bad- more itching 4. Mood/Behavior/Sleep: Provide emotional support             -antipsychotic agents: N/A 5. Neuropsych/cognition: This patient is capable of making decisions on her own behalf. 6. Skin/Wound Care: Routine skin checks 7. Fluids/Electrolytes/Nutrition: Routine in and outs with follow-up chemistries, continue vitamins and supplements.  8.  Mitral valve endocarditis.  IV Rocephin  2 g daily x 6 weeks USING 10/30/2023 AS DAY #1 Ending 12/11/2023 .  Follow-up infectious disease Dr. Artemio Larry as well as outpatient CVTS Dr. Luna Salinas.  Patient has PICC line -11/10/23 mentioned that she had some tremors while IV was infusion this morning-- not long lived. No other symptoms. Monitor for now, doubt this is a true reaction, likely just from cooler feel of fluids infusing.  9.  Leukocytosis/acute blood loss anemia.  Follow-up  CBC  5/15- WBC down to 10. 2 and Hb up to 10k 10.  Hypertension.  Norvasc  10 mg daily, lisinopril  5 mg daily.  Monitor with increased mobility             - Hospitalist recommends stop amlodipine  first if BP soft  -5/15-17 BP looks better- con't regimen  Vitals:   11/07/23 1500 11/07/23 1807 11/08/23 0342 11/08/23 1010  BP: 128/77 129/70 118/73 120/80   11/08/23 1245 11/08/23 1751 11/09/23 0340 11/09/23 0901  BP: 139/74 (!) 108/97 128/75 107/75   11/09/23 1500 11/09/23 1936 11/10/23 0308 11/10/23 0448  BP: 117/72 111/71 (!) 137/90 122/63    11.  Diabetes mellitus.  Hemoglobin A1c 8.7.  Currently on NovoLog  7 units 3 times daily, Semglee  30 units daily, Tradjenta  5 mg daily.  Patient on Glucophage  1000 mg twice daily and Januvia  100 mg daily prior to admission.  Monitor with increased activity 5/15- CBGs 102-202- will monitor trend for 24 hours before changing anything  5/16- CBGs 75-153 doing better- con't regimen  -11/10/23 CBGs great, monitor for now.   CBG (last 3)  Recent Labs    11/09/23 1608 11/10/23 0710 11/10/23 1146  GLUCAP 192* 174* 171*    12.  Hyperlipidemia.  Lipitor 10mg  nightly 13.  Acute encephalopathy/delirium.  Resolved. 14.  Proteinuria.  Continue lisinopril  5mg  daily 15. Constipation             Scheduled MiraLAX  daily, continue Senokot 2 BID  5/15- pt asked for Colace 100mg  BID- ordered for pt.   -11/10/23 LBM yesterday per pt, not documented; monitor for now      LOS: 3 days A FACE TO FACE EVALUATION  WAS PERFORMED  [address] 11/10/2023, 1:27 PM

## 2023-11-10 NOTE — Plan of Care (Incomplete)
  Problem: Consults Goal: RH SPINAL CORD INJURY PATIENT EDUCATION Description:  See Patient Education module for education specifics.  Outcome: Progressing   Problem: SCI BOWEL ELIMINATION Goal: RH STG MANAGE BOWEL WITH ASSISTANCE Description: STG Manage Bowel with supervision Assistance. Outcome: Progressing Goal: RH STG SCI MANAGE BOWEL WITH MEDICATION WITH ASSISTANCE Description: STG SCI Manage bowel with medication with supervision assistance. Outcome: Progressing   Problem: SCI BLADDER ELIMINATION Goal: RH STG MANAGE BLADDER WITH ASSISTANCE Description: STG Manage Bladder With minimal assistance Assistance Outcome: Progressing   Problem: RH SKIN INTEGRITY Goal: RH STG SKIN FREE OF INFECTION/BREAKDOWN Description: Manage skin free of infection/breakdown with minimal assistance Outcome: Progressing   Problem: RH SAFETY Goal: RH STG ADHERE TO SAFETY PRECAUTIONS W/ASSISTANCE/DEVICE Description: STG Adhere to Safety Precautions With minimal  Assistance/Device. Outcome: Progressing   Problem: RH PAIN MANAGEMENT Goal: RH STG PAIN MANAGED AT OR BELOW PT'S PAIN GOAL Description: <4 w/prns Outcome: Progressing   Problem: RH KNOWLEDGE DEFICIT SCI Goal: RH STG INCREASE KNOWLEDGE OF SELF CARE AFTER SCI Description: Manage increase knowledge of  self care after SCI with minimal assistance using educational materials provided  Outcome: Progressing

## 2023-11-10 NOTE — Progress Notes (Signed)
 Physical Therapy Session Note  Patient Details  Name: Kristina Long MRN: [id number] Date of Birth: 1955-08-02  Today's Date: 11/10/2023 PT Individual Time: 3875-6433 PT Individual Time Calculation (min): 44 min   Short Term Goals: Week 1:  PT Short Term Goal 1 (Week 1): =LTGs d/t ELOS  Skilled Therapeutic Interventions/Progress Updates:    Patient in supine and reports no longer on bedrest.  Denies implementation of compression stockings.  States did have some shakiness with new blood thinning medication.  Patient supine to sit with S though not with back precautions.  Education on log roll technique and pt practiced with S with good technique.  Sit to stand with S. And pt ambulated with CGA to S x 180' without LOB and no assistive device.  Negotiated 12 steps initially without rails and with LOB grabbing rail with CGA for safety, then completed the rest with rails.  Patient sit <> stand without UE support x 10.  Standing balance activities with ball toss to rebounder feet wide x 10, then standing on foam x 10 with S to CGA.  Patient balance in SLS for rolling ball under opposite foot x 10 circles x 2 with cues for esp R hip activation in stance due to R hip weakness.  Patient performed side stepping x 8' x 4 with cues for foot clearance, then crossing over in front then braiding with feet no UE support x 4 x 8'.  Patient ambulated to ortho gym with S and performed Nu Step at level 3 x 8 minutes UE/LE.  Education on progression of activity and encouraged to keep working on R LE weakness.  Patient ambulated to room and left EOB with call bell and needs in reach.  Therapy Documentation Precautions:  Precautions Precautions: Back, Fall, Other (comment) Precaution Booklet Issued: No Recall of Precautions/Restrictions: Intact Precaution/Restrictions Comments: Pt able to recall BLT; Drain; RUE PICC Restrictions Weight Bearing Restrictions Per Provider Order: No  Pain: Pain Assessment Pain  Scale: 0-10 Pain Score: 0-No pain Faces Pain Scale: No hurt    Therapy/Group: Individual Therapy  Marley Simmers 11/10/2023, 1:19 PM  Abigail Hoff, PT

## 2023-11-11 LAB — GLUCOSE, CAPILLARY
Glucose-Capillary: 134 mg/dL — ABNORMAL HIGH (ref 70–99)
Glucose-Capillary: 159 mg/dL — ABNORMAL HIGH (ref 70–99)
Glucose-Capillary: 206 mg/dL — ABNORMAL HIGH (ref 70–99)
Glucose-Capillary: 140 mg/dL — ABNORMAL HIGH (ref 70–99)

## 2023-11-11 NOTE — Plan of Care (Signed)
  Problem: Consults Goal: RH SPINAL CORD INJURY PATIENT EDUCATION Description:  See Patient Education module for education specifics.  Outcome: Progressing   Problem: SCI BOWEL ELIMINATION Goal: RH STG MANAGE BOWEL WITH ASSISTANCE Description: STG Manage Bowel with supervision Assistance. Outcome: Progressing Goal: RH STG SCI MANAGE BOWEL WITH MEDICATION WITH ASSISTANCE Description: STG SCI Manage bowel with medication with supervision assistance. Outcome: Progressing   Problem: SCI BLADDER ELIMINATION Goal: RH STG MANAGE BLADDER WITH ASSISTANCE Description: STG Manage Bladder With minimal assistance Assistance Outcome: Progressing   Problem: RH SKIN INTEGRITY Goal: RH STG SKIN FREE OF INFECTION/BREAKDOWN Description: Manage skin free of infection/breakdown with minimal assistance Outcome: Progressing   Problem: RH SAFETY Goal: RH STG ADHERE TO SAFETY PRECAUTIONS W/ASSISTANCE/DEVICE Description: STG Adhere to Safety Precautions With minimal  Assistance/Device. Outcome: Progressing   Problem: RH PAIN MANAGEMENT Goal: RH STG PAIN MANAGED AT OR BELOW PT'S PAIN GOAL Description: <4 w/prns Outcome: Progressing   Problem: RH KNOWLEDGE DEFICIT SCI Goal: RH STG INCREASE KNOWLEDGE OF SELF CARE AFTER SCI Description: Manage increase knowledge of  self care after SCI with minimal assistance using educational materials provided  Outcome: Progressing

## 2023-11-11 NOTE — Progress Notes (Signed)
 Occupational Therapy Session Note  Patient Details  Name: Kristina Long MRN: [id number] Date of Birth: [birth year]  Today's Date: 11/11/2023 OT Individual Time: 0215-0330 OT Individual Time Calculation (min): 75 min    Short Term Goals: Week 1:  OT Short Term Goal 1 (Week 1): STGs=LTGs due to patient's estimated length of stay.  Skilled Therapeutic Interventions/Progress Updates:   Patient indicated that she was able to rest through the night with no pain to report at the time of treatment. Patient in agreement with completing UB exercise to improve functional Ind and safety.  Patient was able to transfer from bed LOF with SBA.  The pt was able to donn her shoes with s/uA. The pt was able to for ambulating to the sink to wash her face and hands with closeS. The pt  went on to ambulate down the hall to the sitting area adjacent to the valet parking area >500 feet.  The pt was able to ambulate free of a device with 1 rest break at the time of arrival. The pt went on to complete AROM exercises incorporating BUE for shld flexion exercise and elbow extension exercises 2 sets of 10 with rest breaks as needed. The pt require 2 rest breaks and was instructed in relaxation breathing to improve compliance, the pt was able to demonstrate effective carryover. The pt ambulated back to her room with close S.   Upon entering her room, the pt was able to go from standing to sitting EOB with close S. The pt was able to doff her shoes with SBA and she transferred from EOB to supine with SBA.   The call light and bedside table were placed within reach with all additional needs addressed.   Therapy Documentation Precautions:  Precautions Precautions: Back, Fall, Other (comment) Precaution Booklet Issued: No Recall of Precautions/Restrictions: Intact Precaution/Restrictions Comments: Pt able to recall BLT; Drain; RUE PICC Restrictions Weight Bearing Restrictions Per Provider Order: No  Therapy/Group: Individual  Therapy  Moises Ang 11/11/2023, 4:44 PM

## 2023-11-11 NOTE — Progress Notes (Signed)
 Speech Language Pathology Daily Session Note  Patient Details  Name: Kristina Long MRN: [id number] Date of Birth: 05-13-1956  Today's Date: 11/11/2023 SLP Individual Time: 1000-1058 SLP Individual Time Calculation (min): 58 min  Short Term Goals: Week 1: SLP Short Term Goal 1 (Week 1): STG = LTG due to ELOS  Skilled Therapeutic Interventions:  Pt seen for ST targeting cognitive linguistic goals. Pt sitting upright in chair upon SLP arrival; denied pain. SLP facilitated functional tasks targeting alternating attention and problem solving. Pt asked to identify errors in multiple medication pillboxes - pt had 100% acc when provided min verbal cueing. She IND scanned from left to right observing for errors and read medication labels x2 to ensure comprehension prior to completing tasks. Pt currently organizes pills at home by keeping the bottles in a bag; SLP encouraged use of pillbox upon d/c to ensure accuracy for taking medications. Pt verbalized understanding. Pt completed functional math tasks to calculate tip from receipts and had 100% acc IND when using pencil/paper. Pt left in chair with call bell and alarm activated. Family friend arrived at end of session.   Pain Pain Assessment Pain Scale: 0-10 Pain Score: 0-No pain  Therapy/Group: Individual Therapy  Caretha Chapel 11/11/2023, 12:02 PM

## 2023-11-11 NOTE — Progress Notes (Signed)
 PROGRESS NOTE   Subjective/Complaints:  Pt doing well again today, slept well, pain well managed, LBM yesterday, urinating fine. No other complaints or concerns.    ROS:   Pt denies SOB, abd pain, CP, N/V/C/D, and vision changes    Objective:   No results found.  No results for input(s): "WBC", "HGB", "HCT", "PLT" in the last 72 hours.  No results for input(s): "NA", "K", "CL", "CO2", "GLUCOSE", "BUN", "CREATININE", "CALCIUM " in the last 72 hours.   Intake/Output Summary (Last 24 hours) at 11/11/2023 1046 Last data filed at 11/10/2023 1700 Gross per 24 hour  Intake 236 ml  Output --  Net 236 ml        Physical Exam: Vital Signs Blood pressure 127/77, pulse 89, temperature 97.8 F (36.6 C), temperature source Oral, resp. rate 17, height 5\' 10"  (1.778 m), weight 85.6 kg, SpO2 98%.     General: awake, alert, appropriate,working with OT in gym; NAD HENT: conjugate gaze; oropharynx moist CV: regular rate and rhythm; no JVD Pulmonary: CTA B/L; no W/R/R- good air movement GI: soft, NT, ND, (+)BS Psychiatric: appropriate, pleasant Neurological: Ox3, no tremors  PRIOR EXAMS: Skin: L-spine incision with dry dressing that's been reinforced, but it's C/D/I- pt c/o itching and can see scratch marks.  Neuro:     Mental Status: AAOx3 Speech/Languate:  fluent, follows simple commands CRANIAL NERVES: 2 through 12 grossly intact     MOTOR: RUE: 5/5 Deltoid, 5/5 Biceps, 5/5 Triceps,5/5 Grip LUE: 5/5 Deltoid, 5/5 Biceps, 5/5 Triceps, 5/5 Grip RLE: HF 4/5, KE 4+/5, ADF 5-/5, APF 5-/5 LLE: HF 4/5, KE 4+/5, ADF 5-/5, APF 5-/5     SENSORY: Normal to touch all 4 extremities, patient reports altered sensation in distal toes      Assessment/Plan: 1. Functional deficits which require 3+ hours per day of interdisciplinary therapy in a comprehensive inpatient rehab setting. Physiatrist is providing close team  supervision and 24 hour management of active medical problems listed below. Physiatrist and rehab team continue to assess barriers to discharge/monitor patient progress toward functional and medical goals  Care Tool:  Bathing    Body parts bathed by patient: Right arm, Left arm, Chest, Abdomen, Front perineal area, Right upper leg, Left upper leg, Face   Body parts bathed by helper: Right lower leg, Left lower leg, Buttocks     Bathing assist Assist Level: Minimal Assistance - Patient > 75%     Upper Body Dressing/Undressing Upper body dressing   What is the patient wearing?: Pull over shirt    Upper body assist Assist Level: Supervision/Verbal cueing    Lower Body Dressing/Undressing Lower body dressing      What is the patient wearing?: Underwear/pull up, Pants     Lower body assist Assist for lower body dressing: Minimal Assistance - Patient > 75%     Toileting Toileting    Toileting assist Assist for toileting: Minimal Assistance - Patient > 75%     Transfers Chair/bed transfer  Transfers assist     Chair/bed transfer assist level: Contact Guard/Touching assist     Locomotion Ambulation   Ambulation assist      Assist level: Contact Guard/Touching assist Assistive  device: No Device Max distance: 500   Walk 10 feet activity   Assist     Assist level: Contact Guard/Touching assist Assistive device: No Device   Walk 50 feet activity   Assist    Assist level: Contact Guard/Touching assist Assistive device: No Device    Walk 150 feet activity   Assist    Assist level: Contact Guard/Touching assist Assistive device: No Device    Walk 10 feet on uneven surface  activity   Assist     Assist level: Contact Guard/Touching assist     Wheelchair     Assist Is the patient using a wheelchair?: No             Wheelchair 50 feet with 2 turns activity    Assist            Wheelchair 150 feet activity      Assist          Blood pressure 127/77, pulse 89, temperature 97.8 F (36.6 C), temperature source Oral, resp. rate 17, height 5\' 10"  (1.778 m), weight 85.6 kg, SpO2 98%.  Medical Problem List and Plan: 1. Functional deficits secondary to strep pneumonaie MV endocarditis with epidural abscess extending from T11-L5 with resultant severe canal stenosis at L1-2 and L2-3 and moderate canal stenosis at L3-4.  Status post decompressive lumbar laminectomy, medial facetectomy and foraminotomies from T12-L3 with evacuation of epidural abscess 10/29/2023 as well as lumbar reexploration for wound drainage with blunt and sharp debridement of soft tissue followed by irrigation of the tissue 11/05/2023. NO BRACE REQUIRED             -patient may shower if lumbar drain/incision can be kept dry             -ELOS/Goals: 12-14d             -Con't CIR 2.  Antithrombotics: -DVT/anticoagulation:  Mechanical: Antiembolism stockings, thigh (TED hose) Bilateral lower extremities -5/16- started on tx dose lovenox  80mg  BID- will change to Eliquis next week for new DVTs- will need meds for 3 months- educated pt on this             -antiplatelet therapy: N/A 3. Pain Management: Celebrex  200 mg every 12 hours, Robaxin  and oxycodone  as needed  5/15- pt reports pain isn't bad- more itching 4. Mood/Behavior/Sleep: Provide emotional support             -antipsychotic agents: N/A 5. Neuropsych/cognition: This patient is capable of making decisions on her own behalf. 6. Skin/Wound Care: Routine skin checks 7. Fluids/Electrolytes/Nutrition: Routine in and outs with follow-up chemistries, continue vitamins and supplements.  8.  Mitral valve endocarditis.  IV Rocephin  2 g daily x 6 weeks USING 10/30/2023 AS DAY #1 Ending 12/11/2023 .  Follow-up infectious disease Dr. Artemio Larry as well as outpatient CVTS Dr. Luna Salinas.  Patient has PICC line -11/10/23 mentioned that she had some tremors while IV was infusion this morning-- not  long lived. No other symptoms. Monitor for now, doubt this is a true reaction, likely just from cooler feel of fluids infusing.  9.  Leukocytosis/acute blood loss anemia.  Follow-up CBC  5/15- WBC down to 10. 2 and Hb up to 10k 10.  Hypertension.  Norvasc  10 mg daily, lisinopril  5 mg daily.  Monitor with increased mobility             - Hospitalist recommends stop amlodipine  first if BP soft  -5/15-18 BP looks better- con't regimen  Vitals:  11/08/23 1010 11/08/23 1245 11/08/23 1751 11/09/23 0340  BP: 120/80 139/74 (!) 108/97 128/75   11/09/23 0901 11/09/23 1500 11/09/23 1936 11/10/23 0308  BP: 107/75 117/72 111/71 (!) 137/90   11/10/23 0448 11/10/23 1425 11/10/23 2016 11/11/23 0533  BP: 122/63 114/67 123/71 127/77    11.  Diabetes mellitus.  Hemoglobin A1c 8.7.  Currently on NovoLog  7 units 3 times daily, Semglee  30 units daily, Tradjenta  5 mg daily.  Patient on Glucophage  1000 mg twice daily and Januvia  100 mg daily prior to admission.  Monitor with increased activity 5/15- CBGs 102-202- will monitor trend for 24 hours before changing anything  5/16- CBGs 75-153 doing better- con't regimen  -11/11/23 CBGs great, monitor for now.   CBG (last 3)  Recent Labs    11/10/23 1625 11/10/23 2136 11/11/23 0622  GLUCAP 121* 151* 134*    12.  Hyperlipidemia.  Lipitor 10mg  nightly 13.  Acute encephalopathy/delirium.  Resolved. 14.  Proteinuria.  Continue lisinopril  5mg  daily 15. Constipation             Scheduled MiraLAX  daily, continue Senokot 2 BID  5/15- pt asked for Colace 100mg  BID- ordered for pt.   -11/11/23 LBM yesterday, cont regimen      LOS: 4 days A FACE TO FACE EVALUATION WAS PERFORMED  [address] 11/11/2023, 10:46 AM

## 2023-11-11 NOTE — Progress Notes (Signed)
 Physical Therapy Session Note  Patient Details  Name: Kristina Long MRN: [id number] Date of Birth: 1955-09-27  Today's Date: 11/11/2023 PT Individual Time: 0805-0900 PT Individual Time Calculation (min): 55 min   Short Term Goals: Week 1:  PT Short Term Goal 1 (Week 1): =LTGs d/t ELOS  Skilled Therapeutic Interventions/Progress Updates: Pt presented in recliner agreeable to therapy. Pt denies pain throughout session. Session focused on dynamic balance and LE strengthening. Pt ambulated to main gym with supervision and no AD. Participated in general LE strengthening as follows: Sit to stand with LLE on 4in block 2 x 10, forward lunges to Bosu ball 2 x 10 bilaterally, side stepping  with red theraband at ankles 42ft x 4, monster walk no resistance 47ft x 4. Pt also participated in use of rebounder while standing on Airex. Participated in step up to 8in step while standing on rebounder x10 bilaterally. Pt then ambulated to day room and participated in NuStep L4 x 8 min BLE only as pt stated arms are uncomfortable. Pt able to maintain ~80SPM for increased cardiovascular activity. Pt then ambulated back to room with supervision. Pt remained in recliner at end of session with seat alarm on, call bell within reach and needs met.      Therapy Documentation Precautions:  Precautions Precautions: Back, Fall, Other (comment) Precaution Booklet Issued: No Recall of Precautions/Restrictions: Intact Precaution/Restrictions Comments: Pt able to recall BLT; Drain; RUE PICC Restrictions Weight Bearing Restrictions Per Provider Order: No General:   Vital Signs: Therapy Vitals Temp: 98.7 F (37.1 C) Temp Source: Oral Pulse Rate: 99 Resp: 20 BP: 122/62 Patient Position (if appropriate): Lying Oxygen  Therapy SpO2: 100 % O2 Device: Room Air  Therapy/Group: Individual Therapy  [name] 11/11/2023, 4:14 PM

## 2023-11-12 LAB — BASIC METABOLIC PANEL WITH GFR
Anion gap: 9 (ref 5–15)
BUN: 19 mg/dL (ref 8–23)
CO2: 25 mmol/L (ref 22–32)
Calcium: 9.6 mg/dL (ref 8.9–10.3)
Chloride: 102 mmol/L (ref 98–111)
Creatinine, Ser: 0.73 mg/dL (ref 0.44–1.00)
GFR, Estimated: >60 mL/min (ref >60)
Glucose, Bld: 148 mg/dL — ABNORMAL HIGH (ref 70–99)
Potassium: 4.3 mmol/L (ref 3.5–5.1)
Sodium: 136 mmol/L (ref 135–145)

## 2023-11-12 LAB — CBC
HCT: 29.3 % — ABNORMAL LOW (ref 36.0–46.0)
Hemoglobin: 9.2 g/dL — ABNORMAL LOW (ref 12.0–15.0)
MCH: 27.3 pg (ref 26.0–34.0)
MCHC: 31.4 g/dL (ref 30.0–36.0)
MCV: 86.9 fL (ref 80.0–100.0)
Platelets: 389 10*3/uL (ref 150–400)
RBC: 3.37 MIL/uL — ABNORMAL LOW (ref 3.87–5.11)
RDW: 14.8 % (ref 11.5–15.5)
WBC: 7.7 10*3/uL (ref 4.0–10.5)
nRBC: 0 % (ref 0.0–0.2)

## 2023-11-12 LAB — GLUCOSE, CAPILLARY
Glucose-Capillary: 160 mg/dL — ABNORMAL HIGH (ref 70–99)
Glucose-Capillary: 123 mg/dL — ABNORMAL HIGH (ref 70–99)
Glucose-Capillary: 145 mg/dL — ABNORMAL HIGH (ref 70–99)
Glucose-Capillary: 133 mg/dL — ABNORMAL HIGH (ref 70–99)

## 2023-11-12 MED ORDER — CEFTRIAXONE IV (FOR PTA / DISCHARGE USE ONLY): 2.0000 g | INTRAVENOUS | 0 refills | Status: AC

## 2023-11-12 NOTE — Progress Notes (Signed)
 Speech Language Pathology Discharge Summary  Patient Details  Name: Kristina Long MRN: [id number] Date of Birth: 09/19/1955  Date of Discharge from SLP service:Nov 12, 2023  Today's Date: 11/12/2023 SLP Individual Time: 7829-5621 SLP Individual Time Calculation (min): 59 min  Skilled Therapeutic Interventions:  Patient was seen in am to address cognitive re- training. Pt was alert and seated upright on EOB. She denied pain and was agreeable for session. Pt verbalized recent medical hx and PLOF indep. SLP engaged pt in medication management task, with pt identifying solutions to medical situations presented verbally indep. She was subsequently challenged in a prescription label interpretation task which she completed with mod I. Pt demonstrated significantly improved cognitive function and awareness of limitations. Discussed with pt perception of cognitive ability with pt verbalizing feeling of being at baseline. SLP reviewed compensatory strategies with pt able to add insight regarding recommendations from previous speech sessions (I.e. pill box) and rationale independently. Due to pt progress, no further skilled intervention warranted at this time. SLP to sign off.   Patient has met 2 of 2 long term goals.  Patient to discharge at overall Modified Independent level.  Reasons goals not met:     Clinical Impression/Discharge Summary: Patient made excellent gains and has met 2 of 2 LTG's this reporting period due to improved problem solving and awareness Currently, pt continues to require mod I to sup A for problem solving, and awareness. Pt/ family education complete and pt will remain in CIR program to complete PT and OT prior to discharge. No further skilled SLP intervention warrnated at this time.  Care Partner:  Caregiver Able to Provide Assistance: Yes  Type of Caregiver Assistance: Cognitive  Recommendation:  None     Equipment: none   Reasons for discharge: Treatment goals met    Patient/Family Agrees with Progress Made and Goals Achieved: Yes    Adela Holter 11/12/2023, 3:39 PM

## 2023-11-12 NOTE — Progress Notes (Signed)
 Met with patient to review current situation, team conference and plan of care. Reviewed medications, antibiotics x 6 weeks per PICC, incision care. Continue to follow along to provide educational needs to facilitate preparation for discharge.

## 2023-11-12 NOTE — Progress Notes (Signed)
 Physical Therapy Session Note  Patient Details  Name: Kristina Long MRN: [id number] Date of Birth: Dec 05, 1955  Today's Date: 11/12/2023 PT Individual Time: 1102-1155 PT Individual Time Calculation (min): 53 min   Short Term Goals: Week 1:  PT Short Term Goal 1 (Week 1): =LTGs d/t ELOS  Skilled Therapeutic Interventions/Progress Updates:    Pt received in recliner and agreeable to therapy.  No complaint of pain.   Session focused on community ambulation, stair navigation, and dual tasking. Pt ambulated over level surfaces with supervision and no AD. Pt navigated hospital stairs to 1st floor with CGA fading to supervision. Cues for slower speed on stairs to increase safety, good carry over throughout session. Pt participated in dual tasking in gift shop with minimal difficulty, including recalling items and prices. Pt then ambulated in outdoor environments, over unlevel and compliant natural surfaces. Demoed good safety with slow speed and watching for trip hazards while walking under trees. Overall, pt walked ~1 mile this session with short intermittent rest breaks. Discussed choosing the level path when able and safety when walking for exercise. Pt expressed understanding. Pt returned to room and remained seated EOB with PA present.   Therapy Documentation Precautions:  Precautions Precautions: Back, Fall, Other (comment) Precaution Booklet Issued: No Recall of Precautions/Restrictions: Intact Precaution/Restrictions Comments: Pt able to recall BLT; Drain; RUE PICC Restrictions Weight Bearing Restrictions Per Provider Order: No General:         Therapy/Group: Individual Therapy  Tex Filbert 11/12/2023, 12:19 PM

## 2023-11-12 NOTE — Progress Notes (Signed)
 PROGRESS NOTE   Subjective/Complaints:  Pt reports itching is gone. Incision open ot air.   Husband has Wed Appt for teaching IV ABX- was wondering her d/c date- said hopefully can d/c Wednesday, but will see.   LBM 2 days ago  ROS:   Pt denies SOB, abd pain, CP, N/V/C/D, and vision changes    Objective:   No results found.  Recent Labs    11/12/23 0552  WBC 7.7  HGB 9.2*  HCT 29.3*  PLT 389    Recent Labs    11/12/23 0552  NA 136  K 4.3  CL 102  CO2 25  GLUCOSE 148*  BUN 19  CREATININE 0.73  CALCIUM  9.6     Intake/Output Summary (Last 24 hours) at 11/12/2023 1018 Last data filed at 11/12/2023 0735 Gross per 24 hour  Intake 236 ml  Output --  Net 236 ml        Physical Exam: Vital Signs Blood pressure (!) 144/72, pulse 87, temperature 97.6 F (36.4 C), temperature source Oral, resp. rate 18, height 5\' 10"  (1.778 m), weight 85.6 kg, SpO2 100%.      General: awake, alert, appropriate,  sitting up on EOB; finished 100% meal; NAD HENT: conjugate gaze; oropharynx moist CV: regular rate and rhythm; no JVD Pulmonary: CTA B/L; no W/R/R- good air movement GI: soft, NT, ND, (+)BS Psychiatric: appropriate Neurological: Ox3 Skin- incision looks great- scabbing up- no drainage or erythema- open to air PRIOR EXAMS: Skin: L-spine incision with dry dressing that's been reinforced, but it's C/D/I- pt c/o itching and can see scratch marks.  Neuro:     Mental Status: AAOx3 Speech/Languate:  fluent, follows simple commands CRANIAL NERVES: 2 through 12 grossly intact     MOTOR: RUE: 5/5 Deltoid, 5/5 Biceps, 5/5 Triceps,5/5 Grip LUE: 5/5 Deltoid, 5/5 Biceps, 5/5 Triceps, 5/5 Grip RLE: HF 4/5, KE 4+/5, ADF 5-/5, APF 5-/5 LLE: HF 4/5, KE 4+/5, ADF 5-/5, APF 5-/5     SENSORY: Normal to touch all 4 extremities, patient reports altered sensation in distal toes      Assessment/Plan: 1. Functional  deficits which require 3+ hours per day of interdisciplinary therapy in a comprehensive inpatient rehab setting. Physiatrist is providing close team supervision and 24 hour management of active medical problems listed below. Physiatrist and rehab team continue to assess barriers to discharge/monitor patient progress toward functional and medical goals  Care Tool:  Bathing    Body parts bathed by patient: Right arm, Left arm, Chest, Abdomen, Front perineal area, Right upper leg, Left upper leg, Face   Body parts bathed by helper: Right lower leg, Left lower leg, Buttocks     Bathing assist Assist Level: Minimal Assistance - Patient > 75%     Upper Body Dressing/Undressing Upper body dressing   What is the patient wearing?: Pull over shirt    Upper body assist Assist Level: Supervision/Verbal cueing    Lower Body Dressing/Undressing Lower body dressing      What is the patient wearing?: Underwear/pull up, Pants     Lower body assist Assist for lower body dressing: Minimal Assistance - Patient > 75%     Toileting Toileting  Toileting assist Assist for toileting: Minimal Assistance - Patient > 75%     Transfers Chair/bed transfer  Transfers assist     Chair/bed transfer assist level: Supervision/Verbal cueing     Locomotion Ambulation   Ambulation assist      Assist level: Supervision/Verbal cueing Assistive device: No Device Max distance: 500   Walk 10 feet activity   Assist     Assist level: Contact Guard/Touching assist Assistive device: No Device   Walk 50 feet activity   Assist    Assist level: Contact Guard/Touching assist Assistive device: No Device    Walk 150 feet activity   Assist    Assist level: Contact Guard/Touching assist Assistive device: No Device    Walk 10 feet on uneven surface  activity   Assist     Assist level: Contact Guard/Touching assist     Wheelchair     Assist Is the patient using a  wheelchair?: No             Wheelchair 50 feet with 2 turns activity    Assist            Wheelchair 150 feet activity     Assist          Blood pressure (!) 144/72, pulse 87, temperature 97.6 F (36.4 C), temperature source Oral, resp. rate 18, height 5\' 10"  (1.778 m), weight 85.6 kg, SpO2 100%.  Medical Problem List and Plan: 1. Functional deficits secondary to strep pneumonaie MV endocarditis with epidural abscess extending from T11-L5 with resultant severe canal stenosis at L1-2 and L2-3 and moderate canal stenosis at L3-4.  Status post decompressive lumbar laminectomy, medial facetectomy and foraminotomies from T12-L3 with evacuation of epidural abscess 10/29/2023 as well as lumbar reexploration for wound drainage with blunt and sharp debridement of soft tissue followed by irrigation of the tissue 11/05/2023. NO BRACE REQUIRED             -patient may shower if lumbar drain/incision can be kept dry             -ELOS/Goals: 12-14d             -Con't CIR PT and OT  Has IV ABX training Wednesday- will see if that's her d/c date 2.  Antithrombotics: -DVT/anticoagulation:  Mechanical: Antiembolism stockings, thigh (TED hose) Bilateral lower extremities -5/16- started on tx dose lovenox  80mg  BID- will change to Eliquis next week for new DVTs- will need meds for 3 months- educated pt on this             -antiplatelet therapy: N/A 3. Pain Management: Celebrex  200 mg every 12 hours, Robaxin  and oxycodone  as needed  5/15- pt reports pain isn't bad- more itching  5/19- itching gone- pain minimal  4. Mood/Behavior/Sleep: Provide emotional support             -antipsychotic agents: N/A 5. Neuropsych/cognition: This patient is capable of making decisions on her own behalf. 6. Skin/Wound Care: Routine skin checks 7. Fluids/Electrolytes/Nutrition: Routine in and outs with follow-up chemistries, continue vitamins and supplements.  8.  Mitral valve endocarditis.  IV Rocephin  2 g  daily x 6 weeks USING 10/30/2023 AS DAY #1 Ending 12/11/2023 .  Follow-up infectious disease Dr. Artemio Larry as well as outpatient CVTS Dr. Luna Salinas.  Patient has PICC line -11/10/23 mentioned that she had some tremors while IV was infusion this morning-- not long lived. No other symptoms. Monitor for now, doubt this is a true reaction, likely just from cooler  feel of fluids infusing. 5/19- no more tremors  9.  Leukocytosis/acute blood loss anemia.  Follow-up CBC  5/15- WBC down to 10. 2 and Hb up to 10k 10.  Hypertension.  Norvasc  10 mg daily, lisinopril  5 mg daily.  Monitor with increased mobility             - Hospitalist recommends stop amlodipine  first if BP soft  -5/15-18 BP looks better- con't regimen  5/19-  BP looks good- con't to monitor trend Vitals:   11/09/23 0340 11/09/23 0901 11/09/23 1500 11/09/23 1936  BP: 128/75 107/75 117/72 111/71   11/10/23 0308 11/10/23 0448 11/10/23 1425 11/10/23 2016  BP: (!) 137/90 122/63 114/67 123/71   11/11/23 0533 11/11/23 1409 11/11/23 2019 11/12/23 0406  BP: 127/77 122/62 115/69 (!) 144/72    11.  Diabetes mellitus.  Hemoglobin A1c 8.7.  Currently on NovoLog  7 units 3 times daily, Semglee  30 units daily, Tradjenta  5 mg daily.  Patient on Glucophage  1000 mg twice daily and Januvia  100 mg daily prior to admission.  Monitor with increased activity 5/15- CBGs 102-202- will monitor trend for 24 hours before changing anything  5/16- CBGs 75-153 doing better- con't regimen  -11/11/23 CBGs great, monitor for now.   5/19- 1 value of 206- otherwise well controlled- con't regimen CBG (last 3)  Recent Labs    11/11/23 1629 11/11/23 2111 11/12/23 0543  GLUCAP 206* 140* 160*    12.  Hyperlipidemia.  Lipitor 10mg  nightly 13.  Acute encephalopathy/delirium.  Resolved. 14.  Proteinuria.  Continue lisinopril  5mg  daily 15. Constipation             Scheduled MiraLAX  daily, continue Senokot 2 BID  5/15- pt asked for Colace 100mg  BID- ordered for pt.   -11/11/23  LBM yesterday, cont regimen  5/19- LBM 2 days ago   I spent a total of  35  minutes on total care today- >50% coordination of care- due to  D/w pt as well as nursing about pt and goals of d/c. Also about no more tremors- and reviewed labs, and vitals- including CBGs'      LOS: 5 days A FACE TO FACE EVALUATION WAS PERFORMED  Hillel Card 11/12/2023, 10:18 AM

## 2023-11-12 NOTE — Progress Notes (Signed)
 Occupational Therapy Session Note  Patient Details  Name: Kristina Long MRN: [id number] Date of Birth: Jul 14, 1955  Today's Date: 11/12/2023 OT Individual Time: 0700-0810 OT Individual Time Calculation (min): 70 min    Short Term Goals: Week 1:  OT Short Term Goal 1 (Week 1): STGs=LTGs due to patient's estimated length of stay.  Skilled Therapeutic Interventions/Progress Updates:    Pt seated EOB upon arrival and agreeable to therapy. Skilled OT services with focus on discharge planning, review of back precautions, AE use/education, bed mobility on std bed, functional amb without AD, standing balance, and safety awareness to increase independence with BADLs. Pt already dressed but demonstrated doffing/donning socks/shoes with/without use of AE at supervision level. Pt recalled back precautions without verbal cues. Amb to ADL apt. Sit<>stand from sofa with supervision. Pt practiced stepping in/out simulated walk-in shower without use of AE or grab bars. Pt demonstrated sit<>supine in std bed with supervision. Amb to day room. Cornhole standing on Airex with CGA. Pt used reacher to retrieve form floor. Amb without AD back to room and sat in recliner. Seat alarm activated. All needs within reach.   Therapy Documentation Precautions:  Precautions Precautions: Back, Fall, Other (comment) Precaution Booklet Issued: No Recall of Precautions/Restrictions: Intact Precaution/Restrictions Comments: Pt able to recall BLT; Drain; RUE PICC Restrictions Weight Bearing Restrictions Per Provider Order: No   Pain:  Pt reports BLE "soreness" but denies back pain this morning  Therapy/Group: Individual Therapy  Doak Free 11/12/2023, 8:14 AM

## 2023-11-12 NOTE — Progress Notes (Addendum)
 Patient ID: Kristina Long, female   DOB: 1955-07-25, 68 y.o.   MRN: [id number] Met with the patient to review plan of care for discharge 11/15/23 ; including home antibiotics through 12/11/23. [name] with [facility] planning to come in for patient education on administration of IV abx. 11/13/23.  Patient has a co-pay for IV meds of $40.04/week. Therapy recommending OP PT follow up. Patient given option of [facility] in [place] or [facility]; patient to review information with her spouse and let me know their preference on 11/13/23. Patient given written information about each facility. Continue to follow along to address educational needs and follow up needs to facilitate preparation for discharge. [name]

## 2023-11-12 NOTE — Plan of Care (Signed)
  Problem: RH Problem Solving Goal: LTG Patient will demonstrate problem solving for (SLP) Description: LTG:  Patient will demonstrate problem solving for basic/complex daily situations with cues  (SLP) Outcome: Completed/Met Flowsheets (Taken 11/12/2023 1546) LTG Patient will demonstrate problem solving for: Modified Independent   Problem: RH Awareness Goal: LTG: Patient will demonstrate awareness during functional activites type of (SLP) Description: LTG: Patient will demonstrate awareness during functional activites type of (SLP) Outcome: Completed/Met Flowsheets (Taken 11/12/2023 1546) Patient will demonstrate during cognitive/linguistic activities awareness type of: Emergent LTG: Patient will demonstrate awareness during cognitive/linguistic activities with assistance of (SLP): Modified Independent

## 2023-11-13 ENCOUNTER — Telehealth (HOSPITAL_COMMUNITY): Payer: Self-pay | Admitting: Pharmacy Technician

## 2023-11-13 ENCOUNTER — Other Ambulatory Visit (HOSPITAL_COMMUNITY): Payer: Self-pay

## 2023-11-13 LAB — GLUCOSE, CAPILLARY
Glucose-Capillary: 179 mg/dL — ABNORMAL HIGH (ref 70–99)
Glucose-Capillary: 188 mg/dL — ABNORMAL HIGH (ref 70–99)
Glucose-Capillary: 156 mg/dL — ABNORMAL HIGH (ref 70–99)

## 2023-11-13 MED ORDER — APIXABAN 5 MG PO TABS
10.0000 mg | ORAL_TABLET | Freq: Two times a day (BID) | ORAL | Status: DC
  Administered 2023-11-13 – 2023-11-15 (×4): 10 mg via ORAL
  Filled 2023-11-13 (×4): qty 2

## 2023-11-13 MED ORDER — APIXABAN 5 MG PO TABS
5.0000 mg | ORAL_TABLET | Freq: Two times a day (BID) | ORAL | Status: DC
  Filled 2023-11-13: qty 1

## 2023-11-13 NOTE — Progress Notes (Addendum)
 Patient ID: Kristina Long, female   DOB: [birth year], 68 y.o.   MRN: [id number] Met with the patient to review team conference report. Reviewed recommending d/c 11/15/23 with goals for supervision. Reviewed OP PT follow up; [facility] has accepted referral and will initiate services upon discharge. Family education scheduled for 11/14/23 with her spouse to include education for IV abx. from Kay Parson with [facility].  Patient acknowledged an understanding of information and is in agreement with the discharge date and plan. [name]

## 2023-11-13 NOTE — Progress Notes (Signed)
 PROGRESS NOTE   Subjective/Complaints:  LBM yesterday.  Pt reports she wants ot know when has IV training- as well as asking about d/c plans.   Pain controlled- last Oxycodone  usage was 5/18.   ROS:   Pt denies SOB, abd pain, CP, N/V/C/D, and vision changes   Objective:   No results found.  Recent Labs    11/12/23 0552  WBC 7.7  HGB 9.2*  HCT 29.3*  PLT 389    Recent Labs    11/12/23 0552  NA 136  K 4.3  CL 102  CO2 25  GLUCOSE 148*  BUN 19  CREATININE 0.73  CALCIUM  9.6     Intake/Output Summary (Last 24 hours) at 11/13/2023 0845 Last data filed at 11/13/2023 0746 Gross per 24 hour  Intake 726 ml  Output --  Net 726 ml        Physical Exam: Vital Signs Blood pressure (!) 143/71, pulse 90, temperature 98.1 F (36.7 C), temperature source Oral, resp. rate 18, height 5\' 10"  (1.778 m), weight 85.6 kg, SpO2 98%.      General: awake, alert, appropriate, walking with OT and IV pole; no Assistive device; NAD HENT: conjugate gaze; oropharynx moist CV: regular rate and rhythm; no JVD Pulmonary: CTA B/L; no W/R/R- good air movement GI: soft, NT, ND, (+)BS Psychiatric: appropriate; asking appropriate questions Neurological: Ox3  Skin- incision looks great- scabbing up- no drainage or erythema- open to air PRIOR EXAMS: Skin: L-spine incision with dry dressing that's been reinforced, but it's C/D/I- pt c/o itching and can see scratch marks.  Neuro:     Mental Status: AAOx3 Speech/Languate:  fluent, follows simple commands CRANIAL NERVES: 2 through 12 grossly intact     MOTOR: RUE: 5/5 Deltoid, 5/5 Biceps, 5/5 Triceps,5/5 Grip LUE: 5/5 Deltoid, 5/5 Biceps, 5/5 Triceps, 5/5 Grip RLE: HF 4/5, KE 4+/5, ADF 5-/5, APF 5-/5 LLE: HF 4/5, KE 4+/5, ADF 5-/5, APF 5-/5     SENSORY: Normal to touch all 4 extremities, patient reports altered sensation in distal toes      Assessment/Plan: 1.  Functional deficits which require 3+ hours per day of interdisciplinary therapy in a comprehensive inpatient rehab setting. Physiatrist is providing close team supervision and 24 hour management of active medical problems listed below. Physiatrist and rehab team continue to assess barriers to discharge/monitor patient progress toward functional and medical goals  Care Tool:  Bathing    Body parts bathed by patient: Right arm, Left arm, Chest, Abdomen, Front perineal area, Right upper leg, Left upper leg, Face   Body parts bathed by helper: Right lower leg, Left lower leg, Buttocks     Bathing assist Assist Level: Minimal Assistance - Patient > 75%     Upper Body Dressing/Undressing Upper body dressing   What is the patient wearing?: Pull over shirt    Upper body assist Assist Level: Independent    Lower Body Dressing/Undressing Lower body dressing      What is the patient wearing?: Underwear/pull up, Pants     Lower body assist Assist for lower body dressing: Supervision/Verbal cueing     Toileting Toileting    Toileting assist Assist for toileting: Minimal  Assistance - Patient > 75%     Transfers Chair/bed transfer  Transfers assist     Chair/bed transfer assist level: Supervision/Verbal cueing     Locomotion Ambulation   Ambulation assist      Assist level: Supervision/Verbal cueing Assistive device: No Device Max distance: 500   Walk 10 feet activity   Assist     Assist level: Supervision/Verbal cueing Assistive device: No Device   Walk 50 feet activity   Assist    Assist level: Supervision/Verbal cueing Assistive device: No Device    Walk 150 feet activity   Assist    Assist level: Supervision/Verbal cueing Assistive device: No Device    Walk 10 feet on uneven surface  activity   Assist     Assist level: Supervision/Verbal cueing     Wheelchair     Assist Is the patient using a wheelchair?: No              Wheelchair 50 feet with 2 turns activity    Assist            Wheelchair 150 feet activity     Assist          Blood pressure (!) 143/71, pulse 90, temperature 98.1 F (36.7 C), temperature source Oral, resp. rate 18, height 5\' 10"  (1.778 m), weight 85.6 kg, SpO2 98%.  Medical Problem List and Plan: 1. Functional deficits secondary to strep pneumonaie MV endocarditis with epidural abscess extending from T11-L5 with resultant severe canal stenosis at L1-2 and L2-3 and moderate canal stenosis at L3-4.  Status post decompressive lumbar laminectomy, medial facetectomy and foraminotomies from T12-L3 with evacuation of epidural abscess 10/29/2023 as well as lumbar reexploration for wound drainage with blunt and sharp debridement of soft tissue followed by irrigation of the tissue 11/05/2023. NO BRACE REQUIRED             -patient may shower if lumbar drain/incision can be kept dry             -ELOS/Goals: 12-14d             Con't CIR PT and OT  Pt reports was told IV training today, not Wednesday- will check with SW  Team conference today to finalize d/c 2.  Antithrombotics: -DVT/anticoagulation:  Mechanical: Antiembolism stockings, thigh (TED hose) Bilateral lower extremities -5/16- started on tx dose lovenox  80mg  BID- will change to Eliquis next week for new DVTs- will need meds for 3 months- educated pt on this 5/20- will change to Tx dose Eliquis- Pharmacy has said copay is $35             -antiplatelet therapy: N/A 3. Pain Management: Celebrex  200 mg every 12 hours, Robaxin  and oxycodone  as needed  5/15- pt reports pain isn't bad- more itching  5/19- itching gone- pain minimal  4. Mood/Behavior/Sleep: Provide emotional support             -antipsychotic agents: N/A 5. Neuropsych/cognition: This patient is capable of making decisions on her own behalf. 6. Skin/Wound Care: Routine skin checks 7. Fluids/Electrolytes/Nutrition: Routine in and outs with follow-up chemistries,  continue vitamins and supplements.  8.  Mitral valve endocarditis.  IV Rocephin  2 g daily x 6 weeks USING 10/30/2023 AS DAY #1 Ending 12/11/2023 .  Follow-up infectious disease Dr. Artemio Larry as well as outpatient CVTS Dr. Luna Salinas.  Patient has PICC line -11/10/23 mentioned that she had some tremors while IV was infusion this morning-- not long lived. No other symptoms. Monitor for  now, doubt this is a true reaction, likely just from cooler feel of fluids infusing. 5/19- no more tremors  9.  Leukocytosis/acute blood loss anemia.  Follow-up CBC  5/15- WBC down to 10. 2 and Hb up to 10k 10.  Hypertension.  Norvasc  10 mg daily, lisinopril  5 mg daily.  Monitor with increased mobility             - Hospitalist recommends stop amlodipine  first if BP soft  -5/15-18 BP looks better- con't regimen  5/19-5/20  overall, BP looks good- con't to monitor trend Vitals:   11/09/23 1936 11/10/23 0308 11/10/23 0448 11/10/23 1425  BP: 111/71 (!) 137/90 122/63 114/67   11/10/23 2016 11/11/23 0533 11/11/23 1409 11/11/23 2019  BP: 123/71 127/77 122/62 115/69   11/12/23 0406 11/12/23 1431 11/12/23 2106 11/13/23 0452  BP: (!) 144/72 119/62 132/74 (!) 143/71    11.  Diabetes mellitus.  Hemoglobin A1c 8.7.  Currently on NovoLog  7 units 3 times daily, Semglee  30 units daily, Tradjenta  5 mg daily.  Patient on Glucophage  1000 mg twice daily and Januvia  100 mg daily prior to admission.  Monitor with increased activity 5/15- CBGs 102-202- will monitor trend for 24 hours before changing anything  5/16- CBGs 75-153 doing better- con't regimen  -11/11/23 CBGs great, monitor for now.   5/19- 1 value of 206- otherwise well controlled- con't regimen  5/20- CBGs looking better- no major elevations- con't regimen CBG (last 3)  Recent Labs    11/12/23 1646 11/12/23 2059 11/13/23 0608  GLUCAP 145* 133* 179*    12.  Hyperlipidemia.  Lipitor 10mg  nightly 13.  Acute encephalopathy/delirium.  Resolved. 14.  Proteinuria.  Continue  lisinopril  5mg  daily 15. Constipation             Scheduled MiraLAX  daily, continue Senokot 2 BID  5/15- pt asked for Colace 100mg  BID- ordered for pt.   -11/11/23 LBM yesterday, cont regimen  5/19- LBM 2 days ago  5/20- LBM yesterday   I spent a total of 38   minutes on total care today- >50% coordination of care- due to  D/w SW as well as OT about plan for IV meds training as well as team conference to finalize d/c.    LOS: 6 days A FACE TO FACE EVALUATION WAS PERFORMED  Vannie Hochstetler 11/13/2023, 8:45 AM

## 2023-11-13 NOTE — Telephone Encounter (Signed)
 Patient Product/process development scientist completed.    The patient is insured through [facility]. Patient has Medicare and is not eligible for a copay card, but may be able to apply for patient assistance or Medicare RX Payment Plan (Patient Must reach out to their plan, if eligible for payment plan), if available.    Ran test claim for Eliquis 5 mg and the current 30 day co-pay is $35.00.   This test claim was processed through [facility]- copay amounts may vary at other pharmacies due to pharmacy/plan contracts, or as the patient moves through the different stages of their insurance plan.     Roland Earl, CPHT Pharmacy Technician III Certified Patient Advocate [facility] Patient Advocate Team Direct Number: [phone]  Fax: [phone]

## 2023-11-13 NOTE — Progress Notes (Signed)
 Occupational Therapy Session Note  Patient Details  Name: Belenda Alviar MRN: [id number] Date of Birth: March 17, 1956  Today's Date: 11/13/2023 OT Individual Time: 1345-1430 OT Individual Time Calculation (min): 45 min    Short Term Goals: Week 1:  OT Short Term Goal 1 (Week 1): STGs=LTGs due to patient's estimated length of stay.  Skilled Therapeutic Interventions/Progress Updates:    Skilled OT intervention with focus on discharge planning, functional amb wihtout AD, standing balance, and safety awareness to increase independence with BADLs. All amb throughout the unit wihtout AD at supervision level. Pt amb 500'+ during activities. Standing balance activities on Wii balance board with focus on weight shifts through hips and BLE. Pt with tendency to use UB and/or lift feet from board to achieve weight shifts. MAx demonstration and tactile cues. Minimal improvement. Pt with no LOB during amb in hallways. Pt returned to room and sat in recliner. All needs within reach.   Therapy Documentation Precautions:  Precautions Precautions: Back, Fall, Other (comment) Precaution Booklet Issued: No Recall of Precautions/Restrictions: Intact Precaution/Restrictions Comments: Pt able to recall BLT; Drain; RUE PICC Restrictions Weight Bearing Restrictions Per Provider Order: No   Pain:  Pt denies pain this afternoon   Therapy/Group: Individual Therapy  Doak Free 11/13/2023, 2:30 PM

## 2023-11-13 NOTE — Progress Notes (Signed)
 Occupational Therapy Session Note  Patient Details  Name: Kristina Long MRN: [id number] Date of Birth: 10-20-1955  Today's Date: 11/13/2023 OT Individual Time: 0700-0810 OT Individual Time Calculation (min): 70 min    Short Term Goals: Week 1:  OT Short Term Goal 1 (Week 1): STGs=LTGs due to patient's estimated length of stay.  Skilled Therapeutic Interventions/Progress Updates:    Pt resting in recliner upon arrival. Pt already dressed but requested to change pants later in session. All LB dressing tasks with supervision. Pt amb without AD throughout session at supervision level. Pt amb to ortho gym-5 mins NuStep for general conditioning. Pt amb to gym for balance tasks at rebounder with basketball standing on floor and Airex with close supervision. No LOB. Pt amb in hallway while tossing therapy ball and bouncing therapy ball. Task completed with CGA. Discussed DME requirements. Pt's shower with built in seat. BSC not needed. Pt returned to room and remained in relciner with all needs within reach.   Therapy Documentation Precautions:  Precautions Precautions: Back, Fall, Other (comment) Precaution Booklet Issued: No Recall of Precautions/Restrictions: Intact Precaution/Restrictions Comments: Pt able to recall BLT; Drain; RUE PICC Restrictions Weight Bearing Restrictions Per Provider Order: No Pain:  Pt denies pain this morning  Therapy/Group: Individual Therapy  Doak Free 11/13/2023, 8:15 AM

## 2023-11-13 NOTE — Progress Notes (Signed)
 Physical Therapy Discharge Summary  Patient Details  Name: Kristina Long MRN: [id number] Date of Birth: 1955-07-05  Date of Discharge from PT service:Nov 14, 2023  Today's Date: 11/14/2023 PT Individual Time: 1435-1530 PT Individual Time Calculation (min): 55 min    Patient has met 8 of 8 long term goals due to improved activity tolerance, improved balance, improved postural control, and ability to compensate for deficits.  Patient to discharge at an ambulatory level Supervision.   Patient's care partner is independent to provide the necessary physical assistance at discharge.Pt to d/c home with her husband who has undergone hands on family training to provide the appropriate level of assist.   Reasons goals not met: NA  Recommendation:  Patient will benefit from ongoing skilled PT services in outpatient setting to continue to advance safe functional mobility, address ongoing impairments in balance, strength, endurance, and minimize fall risk.  Equipment: No equipment provided  Reasons for discharge: treatment goals met and discharge from hospital  Patient/family agrees with progress made and goals achieved: Yes  PT Discharge Precautions/Restrictions   Vital Signs Therapy Vitals Temp: 98.5 F (36.9 C) Temp Source: Oral Pulse Rate: 99 Resp: 16 BP: 111/63 Patient Position (if appropriate): Sitting Oxygen  Therapy SpO2: 99 % O2 Device: Room Air Pain Pain Assessment Pain Scale: 0-10 Pain Score: 0-No pain Pain Interference Pain Interference Pain Effect on Sleep: 1. Rarely or not at all Pain Interference with Therapy Activities: 1. Rarely or not at all Pain Interference with Day-to-Day Activities: 1. Rarely or not at all  Cognition Overall Cognitive Status: Within Functional Limits for tasks assessed Arousal/Alertness: Awake/alert Orientation Level: Oriented X4 Sensation Sensation Light Touch: Impaired Detail Peripheral sensation comments: Decreased sensation in B  feet when in cold temperatures, improved since transferring to CIR. Light Touch Impaired Details: Impaired RLE;Impaired LLE Hot/Cold: Appears Intact Proprioception: Appears Intact Stereognosis: Not tested Coordination Gross Motor Movements are Fluid and Coordinated: Yes Fine Motor Movements are Fluid and Coordinated: Yes Motor  Motor Motor: Within Functional Limits  Mobility Bed Mobility Bed Mobility: Supine to Sit;Sit to Supine Supine to Sit: Independent Sit to Supine: Independent Transfers Transfers: Sit to Stand;Stand to Sit;Stand Pivot Transfers Sit to Stand: Independent Stand to Sit: Independent Transfer (Assistive device): None Locomotion  Gait Ambulation: Yes Gait Assistance: Independent Gait Distance (Feet): 300 Feet Assistive device: None Gait Gait: Yes Gait Pattern: Within Functional Limits Stairs / Additional Locomotion Stairs: Yes Stairs Assistance: Supervision/Verbal cueing Stair Management Technique: One rail Right Number of Stairs: 24 Height of Stairs: 6 Ramp: Independent Curb: Supervision/Verbal cueing Pick up small object from the floor assist level: Supervision/Verbal cueing Wheelchair Mobility Wheelchair Mobility: No  Trunk/Postural Assessment  Cervical Assessment Cervical Assessment: Within Functional Limits Thoracic Assessment Thoracic Assessment: Within Functional Limits Lumbar Assessment Lumbar Assessment: Exceptions to [facility] (spinal precautions) Postural Control Postural Control: Deficits on evaluation Righting Reactions: Decreased Protective Responses: Decreased  Balance Balance Balance Assessed: Yes Static Sitting Balance Static Sitting - Balance Support: Feet supported Static Sitting - Level of Assistance: 7: Independent Dynamic Sitting Balance Dynamic Sitting - Balance Support: During functional activity Dynamic Sitting - Level of Assistance: 6: Modified independent (Device/Increase time) (increased time) Dynamic Sitting - Balance  Activities: Lateral lean/weight shifting Static Standing Balance Static Standing - Balance Support: During functional activity;No upper extremity supported Static Standing - Level of Assistance: 5: Stand by assistance Dynamic Standing Balance Dynamic Standing - Balance Support: During functional activity;No upper extremity supported Dynamic Standing - Level of Assistance: 5: Stand by assistance Dynamic Standing -  Balance Activities: [employer];Reaching for objects Extremity Assessment  RUE Assessment RUE Assessment: Within Functional Limits LUE Assessment LUE Assessment: Within Functional Limits RLE Assessment RLE Assessment: Within Functional Limits General Strength Comments: Grossly 4/5 LLE Assessment LLE Assessment: Within Functional Limits   Rosita DeChalus 11/14/2023, 4:22 PM

## 2023-11-13 NOTE — Progress Notes (Signed)
 Physical Therapy Session Note  Patient Details  Name: Kristina Long MRN: [id number] Date of Birth: September 09, 1955  Today's Date: 11/13/2023 PT Individual Time: 0900-1015 PT Individual Time Calculation (min): 75 min   Short Term Goals: Week 1:  PT Short Term Goal 1 (Week 1): =LTGs d/t ELOS  Skilled Therapeutic Interventions/Progress Updates:    Pt received in recliner and agreeable to therapy.  No complaint of pain.  Pt ambulated throughout session with no AD and supervision. Greatly improved balance while distracted, but occ CGA for distraction.   Pt participated in 6" stairs x 16 with no hand rail and CGA. Pt then performed obstacle course including 6 and 8 inch stairs (supervision ascending, CGA descending) with no hand rails, stepping over 4" aerobic step, curb walking down length of aerobic step to retrieve bowling pins, then line up on counter. Afterward, pt retrieved box from floor and put away bowling pins, including placing on shelf. Discussed maintaining back precautions while performing low reaching.   Pt then participated in floor transfer with CGA, heavy cueing for back precautions. Discussed fall recovery safety and when to activate EMS. Pt performed with CGA. Pt then performed L half kneel to stand 2 x 5 with CGA for strengthening and to allow pt to return to kneeling for prayer on discharge. Attempted on RLE, but pt with increase in pain, appearing to result from increased demand on R hamstring and glute. Downgraded activity  Pt utilized nustep x 15 min on rolling hills program, load interval 5-15. 490 total steps, avg spm=48 (cued to maintain >60). Discussed importance of progressive overload and working at high intensity for general health and disease prevention. Pt mildly resistant to this education.  Pt returned to room after session and to recliner, was left with needs in reach.  Therapy Documentation Precautions:  Precautions Precautions: Back, Fall, Other  (comment) Precaution Booklet Issued: No Recall of Precautions/Restrictions: Intact Precaution/Restrictions Comments: Pt able to recall BLT; Drain; RUE PICC Restrictions Weight Bearing Restrictions Per Provider Order: No General:       Therapy/Group: Individual Therapy  Tex Filbert 11/13/2023, 9:49 AM

## 2023-11-13 NOTE — Progress Notes (Signed)
 Occupational Therapy Session Note  Patient Details  Name: Shanaya Schneck MRN: [id number] Date of Birth: [birth year]  Today's Date: 11/14/2023 OT Individual Time: 1332-1400 OT Individual Time Calculation (min): 28 min  and Today's Date: 11/14/2023 OT Missed Time: 30 Minutes Missed Time Reason: Nursing care (PICC line education)   Short Term Goals: Week 1:  OT Short Term Goal 1 (Week 1): STGs=LTGs due to patient's estimated length of stay.  Skilled Therapeutic Interventions/Progress Updates:  Pt received sitting in recliner with no complaints of pain  Pt's spouse present for caregiver education with focus on OT role, OT POC, and current patient functioning, including recommendation for 24/7 supervision level due to cognitive deficits. Emphasis placed on carryover of spinal precautions into ADL routine, education/examples provided of movements against precautions, long-handled sponge provided for LB bathing. Walk-in shower reviewed/practiced at supervision level. Education provided on non-slip mat for inside/outside of shower. All questions answered at end of session, pt remained sitting EOB.   Therapy Documentation Precautions:  Precautions Precautions: Back, Fall, Other (comment) Precaution Booklet Issued: No Recall of Precautions/Restrictions: Intact Precaution/Restrictions Comments: Pt able to recall BLT; Drain; RUE PICC Restrictions Weight Bearing Restrictions Per Provider Order: No   Therapy/Group: Individual Therapy  Artemus Biles, OTR/L, MSOT  11/14/2023, 7:57 AM

## 2023-11-14 ENCOUNTER — Other Ambulatory Visit (HOSPITAL_COMMUNITY): Payer: Self-pay

## 2023-11-14 LAB — GLUCOSE, CAPILLARY
Glucose-Capillary: 145 mg/dL — ABNORMAL HIGH (ref 70–99)
Glucose-Capillary: 197 mg/dL — ABNORMAL HIGH (ref 70–99)
Glucose-Capillary: 119 mg/dL — ABNORMAL HIGH (ref 70–99)
Glucose-Capillary: 163 mg/dL — ABNORMAL HIGH (ref 70–99)

## 2023-11-14 MED ORDER — OXYCODONE HCL 5 MG PO TABS
5.0000 mg | ORAL_TABLET | Freq: Four times a day (QID) | ORAL | 0 refills | Status: AC | PRN
  Filled 2023-11-14: qty 30, 8d supply, fill #0

## 2023-11-14 MED ORDER — SITAGLIPTIN PHOSPHATE 100 MG PO TABS
100.0000 mg | ORAL_TABLET | Freq: Every day | ORAL | 0 refills | Status: DC
  Filled 2023-11-14: qty 30, 30d supply, fill #0

## 2023-11-14 MED ORDER — LORATADINE 10 MG PO TABS
10.0000 mg | ORAL_TABLET | Freq: Every day | ORAL | 0 refills | Status: DC | PRN
  Filled 2023-11-14: qty 30, 30d supply, fill #0

## 2023-11-14 MED ORDER — METFORMIN HCL ER 500 MG PO TB24
1000.0000 mg | ORAL_TABLET | Freq: Two times a day (BID) | ORAL | Status: DC
  Administered 2023-11-14 – 2023-11-15 (×3): 1000 mg via ORAL
  Filled 2023-11-14 (×3): qty 2

## 2023-11-14 MED ORDER — CALCIUM CARBONATE 1250 (500 CA) MG PO TABS
625.0000 mg | ORAL_TABLET | Freq: Every day | ORAL | 0 refills | Status: AC
  Filled 2023-11-14: qty 30, 60d supply, fill #0
  Filled 2023-11-14: qty 30, 72d supply, fill #0

## 2023-11-14 MED ORDER — LISINOPRIL 5 MG PO TABS
5.0000 mg | ORAL_TABLET | Freq: Every day | ORAL | 0 refills | Status: AC
  Filled 2023-11-14: qty 30, 30d supply, fill #0

## 2023-11-14 MED ORDER — AMLODIPINE BESYLATE 10 MG PO TABS
10.0000 mg | ORAL_TABLET | Freq: Every day | ORAL | 0 refills | Status: DC
  Filled 2023-11-14: qty 30, 30d supply, fill #0

## 2023-11-14 MED ORDER — METHOCARBAMOL 500 MG PO TABS
500.0000 mg | ORAL_TABLET | Freq: Three times a day (TID) | ORAL | 0 refills | Status: AC | PRN
  Filled 2023-11-14: qty 60, 20d supply, fill #0

## 2023-11-14 MED ORDER — APIXABAN 5 MG PO TABS
5.0000 mg | ORAL_TABLET | Freq: Two times a day (BID) | ORAL | 0 refills | Status: DC
  Filled 2023-11-14: qty 60, 30d supply, fill #0

## 2023-11-14 MED ORDER — METFORMIN HCL ER 500 MG PO TB24
1000.0000 mg | ORAL_TABLET | Freq: Two times a day (BID) | ORAL | 0 refills | Status: DC
  Filled 2023-11-14: qty 120, 30d supply, fill #0

## 2023-11-14 MED ORDER — POLYETHYLENE GLYCOL 3350 17 G PO PACK: 17.0000 g | PACK | Freq: Every day | ORAL | Status: AC | PRN

## 2023-11-14 MED ORDER — ATORVASTATIN CALCIUM 10 MG PO TABS
10.0000 mg | ORAL_TABLET | Freq: Every day | ORAL | 0 refills | Status: DC
  Filled 2023-11-14: qty 30, 30d supply, fill #0

## 2023-11-14 NOTE — Plan of Care (Signed)
  Problem: RH Simple Meal Prep Goal: LTG Patient will perform simple meal prep w/assist (OT) Description: LTG: Patient will perform simple meal prep with assistance, with/without cues (OT). Outcome: Completed/Met

## 2023-11-14 NOTE — Patient Care Conference (Signed)
 Inpatient RehabilitationTeam Conference and Plan of Care Update Date: 11/14/2023   Time: 7:38 AM    Patient Name: Kristina Long      Medical Record Number: [id number]  Date of Birth: 1955-10-11 Sex: Female         Room/Bed: 4M08C/4M08C-01 Payor Info: Payor: Advertising copywriter MEDICARE / Plan: [facility] MEDICARE / Product Type: *No Product type* /    Admit Date/Time:  11/07/2023  2:43 PM  Primary Diagnosis:  Epidural abscess  Hospital Problems: Principal Problem:   Epidural abscess    Expected Discharge Date: Expected Discharge Date: 11/15/23  Team Members Present: Physician leading conference: Dr. Celia Coles Social Worker Present: Other (comment) Forrestine Ike, RN) Nurse Present: Jerene Monks, RN PT Present: Aundria Leech, PT OT Present: Jackqueline Mason, COTA PPS Coordinator present : Jestine Moron, SLP     Current Status/Progress Goal Weekly Team Focus  Bowel/Bladder   continent of b/b; LBM: 5/19   remain continent   assist with toileting needs prn    Swallow/Nutrition/ Hydration               ADL's   supervision/CGA BADLs and functional transfers, min verbal cues for back precautions   supervision overall   discharge planning, education, safety awareness    Mobility   nearing goal level, CGA for more difficult surfaces and stairs, but improving quickly   supervision for all mobility  balance, dual tasking, endurance    Communication                Safety/Cognition/ Behavioral Observations               Pain   c/o pain to back; prn oxycodone  5mg    pain level <4/10   assess pain QS and prn    Skin   incision to back OTA   remain free of new skinbrakdown/infection  assess skin QS and prn      Discharge Planning:  D/c home w spouse; family education Wed. 11/14/23 1p-4p. IV abx completed 11/10/23; co-pay confirmed if needed longer.    Team Discussion: Patient admitted post decompressive laminectomy , medial facetectomy and foraminotomies from  T13-L3 with evacuation of epidural abscess and debridement of soft tissue followed by irrigation. Patient limited by pain and back precautions.  Patient on target to meet rehab goals: yes, Patient requires supervision/CGA with ADLs and transfers. Patient requires CGA with mobility. Overall goals at discharge are set for supervision assistance.  *See Care Plan and progress notes for long and short-term goals.   Revisions to Treatment Plan:  IV infusion Education 11/14/2023   Teaching Needs: Safety, toileting, medications, transfers, picc line management for home antibiotics, dietary modifications, diabetic teachings, incision care, back precautions etc.   Current Barriers to Discharge: Decreased caregiver support, IV antibiotics, and back precautions  Possible Resolutions to Barriers: Family Education Outpatient follow-up     Medical Summary Current Status: incision looks great- scabbed- no pain- last opaites 5/18- IV ABX; continent-  Barriers to Discharge: Infection/IV Antibiotics;Self-care education;Weight bearing restrictions  Barriers to Discharge Comments: noncompliant about staying with bed/chair alarm- IV ABX- knows back precautions, but doesn't always follow- Possible Resolutions to [employer] Focus: goals Supervision- high level function- no Neurogenic BOwel/bladder-  d/c 5/22   Continued Need for Acute Rehabilitation Level of Care: The patient requires daily medical management by a physician with specialized training in physical medicine and rehabilitation for the following reasons: Direction of a multidisciplinary physical rehabilitation program to maximize functional independence : Yes Medical management of  patient stability for increased activity during participation in an intensive rehabilitation regime.: Yes Analysis of laboratory values and/or radiology reports with any subsequent need for medication adjustment and/or medical intervention. : Yes   I attest that I  was present, lead the team conference, and concur with the assessment and plan of the team.   Kyron Schlitt Gayo 11/13/2023, 1104 am

## 2023-11-14 NOTE — Progress Notes (Signed)
 Occupational Therapy Session Note  Patient Details  Name: Kristina Long MRN: [id number] Date of Birth: [birth year]  Today's Date: 11/14/2023 OT Individual Time: 0700-0810 OT Individual Time Calculation (min): 70 min    Short Term Goals: Week 1:  OT Short Term Goal 1 (Week 1): STGs=LTGs due to patient's estimated length of stay.  Skilled Therapeutic Interventions/Progress Updates:    Skilled OT intervention with focus on functional amb without AD, standing balance, discharge planning, safety awareness, and activity tolerance to increase independence with BADLs and prepare for discharge home tomorrow. Pt amb without AD throughout unit and hallways at supervision level during session. No LOB noted. Pt completed UBE task standing on floor and AirEx on rolling hills levels 2-12. No LOB noted. Balance activities included kicking Hoverball against wall and in hallway and bouncing therapy ball while amb. Pt required CGA during activities. No LOB but pt not in control during activities. Reviewed back precautions. Pt returned to room and remained in recliner. All needs within reach.   Therapy Documentation Precautions:  Precautions Precautions: Back, Fall, Other (comment) Precaution Booklet Issued: No Recall of Precautions/Restrictions: Intact Precaution/Restrictions Comments: Pt able to recall BLT; Drain; RUE PICC Restrictions Weight Bearing Restrictions Per Provider Order: No Pain:  Pt denies pain this morning   Therapy/Group: Individual Therapy  Doak Free 11/14/2023, 8:16 AM

## 2023-11-14 NOTE — Progress Notes (Signed)
 Occupational Therapy Discharge Summary  Patient Details  Name: Kristina Long MRN: [id number] Date of Birth: 03/28/1956  Date of Discharge from OT service: Nov 14, 2023  Patient has met 8 of 8 long term goals due to improved activity tolerance, improved balance, ability to compensate for deficits, and improved awareness.  Pt made steady progress with BADLs, IADLs, and functional transfers during thisadmission. All amb and transfers without AD. Pt completes all BADLs and IADLs at supervision/mod I level. No unsafe behaviors noted. All amb and transfers witout AD at supervision level. Patient to discharge at overall Supervision level.  Patient's care partner is independent to provide the necessary physical and cognitive assistance at discharge.    Reasons goals not met: n/a  Recommendation:  Ongoing skilled OT services not recommended at this time.   Equipment: No equipment provided  Reasons for discharge: treatment goals met and discharge from hospital  Patient/family agrees with progress made and goals achieved: Yes  OT Discharge ADL ADL Equipment Provided: Reacher Eating: Independent Where Assessed-Eating: Chair Grooming: Independent Where Assessed-Grooming: Standing at sink Upper Body Bathing: Modified independent Where Assessed-Upper Body Bathing: Chair Lower Body Bathing: Modified independent Where Assessed-Lower Body Bathing: Chair Upper Body Dressing: Independent Where Assessed-Upper Body Dressing: Sitting at sink Lower Body Dressing: Modified independent Where Assessed-Lower Body Dressing: Sitting at sink, Standing at sink, Chair Toileting: Modified independent Where Assessed-Toileting: Teacher, adult education: Community education officer Method: Proofreader: Engineer, technical sales: Unable to Engineer, technical sales: Close supervision Film/video editor Method: Designer, industrial/product: Magazine features editor Baseline Vision/History: 1 Wears glasses Patient Visual Report: No change from baseline Perception  Perception: Within Functional Limits Praxis Praxis: WFL Cognition Cognition Overall Cognitive Status: Within Functional Limits for tasks assessed Arousal/Alertness: Awake/alert Orientation Level: Person;Place;Situation Person: Oriented Place: Oriented Situation: Oriented Memory: Appears intact Attention: Focused;Sustained Focused Attention: Appears intact Sustained Attention: Appears intact Awareness: Appears intact Problem Solving: Appears intact Safety/Judgment: Appears intact Brief Interview for Mental Status (BIMS) Repetition of Three Words (First Attempt): 3 Temporal Orientation: Year: Correct Temporal Orientation: Month: Accurate within 5 days Temporal Orientation: Day: Correct Recall: "Sock": Yes, no cue required Recall: "Blue": Yes, no cue required Recall: "Bed": Yes, no cue required BIMS Summary Score: 15 Sensation Sensation Light Touch: Impaired Detail Peripheral sensation comments: Decreased sensation in B feet when in cold temperatures, improved since transferring to CIR. Hot/Cold: Appears Intact Proprioception: Appears Intact Stereognosis: Not tested Coordination Gross Motor Movements are Fluid and Coordinated: Yes Fine Motor Movements are Fluid and Coordinated: Yes Motor  Motor Motor: Within Functional Limits Mobility  Transfers Sit to Stand: Supervision/Verbal cueing Stand to Sit: Supervision/Verbal cueing  Trunk/Postural Assessment  Cervical Assessment Cervical Assessment: Within Functional Limits Thoracic Assessment Thoracic Assessment: Within Functional Limits Lumbar Assessment Lumbar Assessment: Exceptions to [facility] (back precautions) Postural Control Righting Reactions: Decreased Protective Responses: Decreased  Balance Balance Balance Assessed: Yes Static Sitting Balance Static Sitting - Balance Support: Feet  supported Static Sitting - Level of Assistance: 7: Independent Dynamic Sitting Balance Dynamic Sitting - Balance Support: During functional activity Dynamic Sitting - Level of Assistance: 5: Stand by assistance (SUP) Dynamic Sitting - Balance Activities: Lateral lean/weight shifting;Reaching for objects Static Standing Balance Static Standing - Balance Support: During functional activity;No upper extremity supported Static Standing - Level of Assistance: 5: Stand by assistance (SUP) Dynamic Standing Balance Dynamic Standing - Balance Support: During functional activity;No upper extremity supported Dynamic Standing - Level of Assistance: 5: Stand by assistance (SUP) Dynamic  Standing - Balance Activities: Lateral lean/weight shifting;Reaching for objects;Forward lean/weight shifting Extremity/Trunk Assessment RUE Assessment RUE Assessment: Within Functional Limits LUE Assessment LUE Assessment: Within Functional Limits   Prepared by: Doak Free 11/14/2023, 8:15 AM  Signed by: Artemus Biles, OTR/L, MSOT 11/14/23

## 2023-11-14 NOTE — Progress Notes (Signed)
 PROGRESS NOTE   Subjective/Complaints:  Last Oxycodone  usage 5/18  Family and IV ABX training.  Ate 100% tray.  2 Bms yesterday.  Got colace overnight.   Went of d/c plans and how it works.   ROS:   Pt denies SOB, abd pain, CP, N/V/C/D, and vision changes    Objective:   No results found.  Recent Labs    11/12/23 0552  WBC 7.7  HGB 9.2*  HCT 29.3*  PLT 389    Recent Labs    11/12/23 0552  NA 136  K 4.3  CL 102  CO2 25  GLUCOSE 148*  BUN 19  CREATININE 0.73  CALCIUM  9.6     Intake/Output Summary (Last 24 hours) at 11/14/2023 0753 Last data filed at 11/13/2023 2111 Gross per 24 hour  Intake 482 ml  Output --  Net 482 ml        Physical Exam: Vital Signs Blood pressure 119/69, pulse 89, temperature 98.5 F (36.9 C), temperature source Oral, resp. rate 16, height 5\' 10"  (1.778 m), weight 85.6 kg, SpO2 100%.       General: awake, alert, appropriate, sitting in bedside chair; also seen in hallway kicking soccer ball; NAD HENT: conjugate gaze; oropharynx moist CV: regular rate and rhythm; no JVD Pulmonary: CTA B/L; no W/R/R- good air movement GI: soft, NT, ND, (+)BS Psychiatric: appropriate Neurological: Ox3   Skin- incision looks great- scabbing up- no drainage or erythema- open to air PRIOR EXAMS: Skin: L-spine incision with dry dressing that's been reinforced, but it's C/D/I- pt c/o itching and can see scratch marks.  Neuro:     Mental Status: AAOx3 Speech/Languate:  fluent, follows simple commands CRANIAL NERVES: 2 through 12 grossly intact     MOTOR: RUE: 5/5 Deltoid, 5/5 Biceps, 5/5 Triceps,5/5 Grip LUE: 5/5 Deltoid, 5/5 Biceps, 5/5 Triceps, 5/5 Grip RLE: HF 4/5, KE 4+/5, ADF 5-/5, APF 5-/5 LLE: HF 4/5, KE 4+/5, ADF 5-/5, APF 5-/5     SENSORY: Normal to touch all 4 extremities, patient reports altered sensation in distal toes      Assessment/Plan: 1. Functional  deficits which require 3+ hours per day of interdisciplinary therapy in a comprehensive inpatient rehab setting. Physiatrist is providing close team supervision and 24 hour management of active medical problems listed below. Physiatrist and rehab team continue to assess barriers to discharge/monitor patient progress toward functional and medical goals  Care Tool:  Bathing    Body parts bathed by patient: Right arm, Left arm, Chest, Abdomen, Front perineal area, Right upper leg, Left upper leg, Face   Body parts bathed by helper: Right lower leg, Left lower leg, Buttocks     Bathing assist Assist Level: Minimal Assistance - Patient > 75%     Upper Body Dressing/Undressing Upper body dressing   What is the patient wearing?: Pull over shirt    Upper body assist Assist Level: Independent    Lower Body Dressing/Undressing Lower body dressing      What is the patient wearing?: Underwear/pull up, Pants     Lower body assist Assist for lower body dressing: Supervision/Verbal cueing     Toileting Toileting    Toileting assist  Assist for toileting: Minimal Assistance - Patient > 75%     Transfers Chair/bed transfer  Transfers assist     Chair/bed transfer assist level: Supervision/Verbal cueing     Locomotion Ambulation   Ambulation assist      Assist level: Supervision/Verbal cueing Assistive device: No Device Max distance: 500   Walk 10 feet activity   Assist     Assist level: Supervision/Verbal cueing Assistive device: No Device   Walk 50 feet activity   Assist    Assist level: Supervision/Verbal cueing Assistive device: No Device    Walk 150 feet activity   Assist    Assist level: Supervision/Verbal cueing Assistive device: No Device    Walk 10 feet on uneven surface  activity   Assist     Assist level: Supervision/Verbal cueing     Wheelchair     Assist Is the patient using a wheelchair?: No             Wheelchair  50 feet with 2 turns activity    Assist            Wheelchair 150 feet activity     Assist          Blood pressure 119/69, pulse 89, temperature 98.5 F (36.9 C), temperature source Oral, resp. rate 16, height 5\' 10"  (1.778 m), weight 85.6 kg, SpO2 100%.  Medical Problem List and Plan: 1. Functional deficits secondary to strep pneumonaie MV endocarditis with epidural abscess extending from T11-L5 with resultant severe canal stenosis at L1-2 and L2-3 and moderate canal stenosis at L3-4.  Status post decompressive lumbar laminectomy, medial facetectomy and foraminotomies from T12-L3 with evacuation of epidural abscess 10/29/2023 as well as lumbar reexploration for wound drainage with blunt and sharp debridement of soft tissue followed by irrigation of the tissue 11/05/2023. NO BRACE REQUIRED             -patient may shower if lumbar drain/incision can be kept dry             -ELOS/Goals: 12-14d          D/c tomorrow Getting IV ABX training and family training today- went over d/c plans- timing, f/u's and meds- with pt.   Alking with no AD- but working on higher level balance- so almost ready for d/c 2.  Antithrombotics: -DVT/anticoagulation:  Mechanical: Antiembolism stockings, thigh (TED hose) Bilateral lower extremities -5/16- started on tx dose lovenox  80mg  BID- will change to Eliquis next week for new DVTs- will need meds for 3 months- educated pt on this 5/20- will change to Tx dose Eliquis- Pharmacy has said copay is $35             -antiplatelet therapy: N/A 3. Pain Management: Celebrex  200 mg every 12 hours, Robaxin  and oxycodone  as needed  5/15- pt reports pain isn't bad- more itching  5/19- itching gone- pain minimal  4. Mood/Behavior/Sleep: Provide emotional support             -antipsychotic agents: N/A 5. Neuropsych/cognition: This patient is capable of making decisions on her own behalf. 6. Skin/Wound Care: Routine skin checks 7. Fluids/Electrolytes/Nutrition:  Routine in and outs with follow-up chemistries, continue vitamins and supplements.  8.  Mitral valve endocarditis.  IV Rocephin  2 g daily x 6 weeks USING 10/30/2023 AS DAY #1 Ending 12/11/2023 .  Follow-up infectious disease Dr. Artemio Larry as well as outpatient CVTS Dr. Luna Salinas.  Patient has PICC line -11/10/23 mentioned that she had some tremors while IV was  infusion this morning-- not long lived. No other symptoms. Monitor for now, doubt this is a true reaction, likely just from cooler feel of fluids infusing. 5/19- no more tremors  9.  Leukocytosis/acute blood loss anemia.  Follow-up CBC  5/15- WBC down to 10. 2 and Hb up to 10k 10.  Hypertension.  Norvasc  10 mg daily, lisinopril  5 mg daily.  Monitor with increased mobility             - Hospitalist recommends stop amlodipine  first if BP soft  -5/15-18 BP looks better- con't regimen  5/19-5/20  overall, BP looks good- con't to monitor trend  5/21- BP running higher slightly this AM, but doesn't follow a trend.  Vitals:   11/10/23 0448 11/10/23 1425 11/10/23 2016 11/11/23 0533  BP: 122/63 114/67 123/71 127/77   11/11/23 1409 11/11/23 2019 11/12/23 0406 11/12/23 1431  BP: 122/62 115/69 (!) 144/72 119/62   11/12/23 2106 11/13/23 0452 11/13/23 2000 11/14/23 0459  BP: 132/74 (!) 143/71 134/79 119/69    11.  Diabetes mellitus.  Hemoglobin A1c 8.7.  Currently on NovoLog  7 units 3 times daily, Semglee  30 units daily, Tradjenta  5 mg daily.  Patient on Glucophage  1000 mg twice daily and Januvia  100 mg daily prior to admission.  Monitor with increased activity 5/15- CBGs 102-202- will monitor trend for 24 hours before changing anything  5/16- CBGs 75-153 doing better- con't regimen  -11/11/23 CBGs great, monitor for now.   5/19- 1 value of 206- otherwise well controlled- con't regimen  5/20- CBGs looking better- no major elevations- con't regimen  5/21- pt not going home on Insulin  after speaking with her endocrinologist- will be seen by Endo within 1  week after d/c  CBG (last 3)  Recent Labs    11/13/23 1140 11/13/23 1652 11/14/23 0620  GLUCAP 188* 156* 145*    12.  Hyperlipidemia.  Lipitor 10mg  nightly 13.  Acute encephalopathy/delirium.  Resolved. 14.  Proteinuria.  Continue lisinopril  5mg  daily 15. Constipation             Scheduled MiraLAX  daily, continue Senokot 2 BID  5/15- pt asked for Colace 100mg  BID- ordered for pt.   5/21- 2 Bms yesterday   I spent a total of  38  minutes on total care today- >50% coordination of care- due to  D/w PA about Endo; and insulin - also reviewed d/c meds and f/u plan- will not need to see me in clinic after d/c.    LOS: 7 days A FACE TO FACE EVALUATION WAS PERFORMED  Kristina Long 11/14/2023, 7:53 AM

## 2023-11-14 NOTE — Progress Notes (Signed)
 Physical Therapy Session Note  Patient Details  Name: Kristina Long MRN: [id number] Date of Birth: 1955-07-07  Today's Date: 11/14/2023 PT Individual Time: 1435-1530 PT Individual Time Calculation (min): 55 min   Short Term Goals: Week 1:  PT Short Term Goal 1 (Week 1): =LTGs d/t ELOS  Skilled Therapeutic Interventions/Progress Updates: Pt presented in w/c with husband present agreeable to therapy. Pt states mild unrated pain near incision area, no intervention requested. Session focused on family education in preparation for d/c. Pt ambulated to ortho gym and completed car transfer, gait on uneven surfaces and curb step. Pt performed all activities distant supervision/mod I. Pt then worked on dual task activity incorporating ball bouncing and ambulation with pt demonstrating some reaching outside BOS. Although pt did not break spinal precautions pt stated caused some increased pain. During seated rest break discussed with pt and husband importance for energy conservation as well progression of therapies during outpatient. Pt and husband verbalized understanding. Pt's husband also had questions regarding use of equipment at local gym as anticipates they will go together on occasion. Discussed certain stationary equipment (leg press, hip abd/add, etc) and also advised to initially speak with trainer or someone on site to receive appropriate instruction on use of equipment. Both verbalized understanding. Pt then ascended/descended x 1 flight of stairs with single rail and supervision. Pt then ambulated with husband downstairs to pharmacy via elevator and PTA providing education on finding spots for rest (leaning against wall if necessary). Pt returned to room at end of session and returned to sitting in w/c with husband present and needs met.      Therapy Documentation Precautions:  Precautions Precautions: Back, Fall, Other (comment) Precaution Booklet Issued: No Recall of  Precautions/Restrictions: Intact Precaution/Restrictions Comments: Pt able to recall BLT; Drain; RUE PICC Restrictions Weight Bearing Restrictions Per Provider Order: No General:   Vital Signs: Therapy Vitals Temp: 98.5 F (36.9 C) Temp Source: Oral Pulse Rate: 99 Resp: 16 BP: 111/63 Patient Position (if appropriate): Sitting Oxygen  Therapy SpO2: 99 % O2 Device: Room Air Pain: Pain Assessment Pain Scale: 0-10 Pain Score: 0-No pain Mobility: Bed Mobility Bed Mobility: Supine to Sit;Sit to Supine Supine to Sit: Independent Sit to Supine: Independent Transfers Transfers: Sit to Stand;Stand to Sit;Stand Pivot Transfers Sit to Stand: Independent Stand to Sit: Independent Transfer (Assistive device): None Locomotion : Gait Ambulation: Yes Gait Assistance: Independent Gait Distance (Feet): 300 Feet Assistive device: None Gait Gait: Yes Gait Pattern: Within Functional Limits Stairs / Additional Locomotion Stairs: Yes Stairs Assistance: Supervision/Verbal cueing Stair Management Technique: One rail Right Number of Stairs: 24 Height of Stairs: 6 Ramp: Independent Curb: Supervision/Verbal cueing Wheelchair Mobility Wheelchair Mobility: No  Trunk/Postural Assessment : Cervical Assessment Cervical Assessment: Within Functional Limits Thoracic Assessment Thoracic Assessment: Within Functional Limits Lumbar Assessment Lumbar Assessment: Exceptions to [facility] (spinal precautions) Postural Control Postural Control: Deficits on evaluation Righting Reactions: Decreased Protective Responses: Decreased  Balance: Balance Balance Assessed: Yes Static Sitting Balance Static Sitting - Balance Support: Feet supported Static Sitting - Level of Assistance: 7: Independent Dynamic Sitting Balance Dynamic Sitting - Balance Support: During functional activity Dynamic Sitting - Level of Assistance: 6: Modified independent (Device/Increase time) (increased time) Dynamic Sitting -  Balance Activities: Lateral lean/weight shifting Static Standing Balance Static Standing - Balance Support: During functional activity;No upper extremity supported Static Standing - Level of Assistance: 5: Stand by assistance Dynamic Standing Balance Dynamic Standing - Balance Support: During functional activity;No upper extremity supported Dynamic Standing - Level of Assistance:  5: Stand by assistance Dynamic Standing - Balance Activities: [employer];Reaching for objects Exercises:   Other Treatments:      Therapy/Group: Individual Therapy  Tysean Vandervliet 11/14/2023, 4:22 PM

## 2023-11-15 LAB — GLUCOSE, CAPILLARY: Glucose-Capillary: 131 mg/dL — ABNORMAL HIGH (ref 70–99)

## 2023-11-15 NOTE — Progress Notes (Signed)
 PROGRESS NOTE   Subjective/Complaints:  Pt reports having questions about f/u's who to call if issues; husband also asking  about plan for IV ABX, need to keep IV ABX around same time of day, but can advance by 15 minutes at a time if needed.  Ate 100% tray  ROS:   Pt denies SOB, abd pain, CP, N/V/C/D, and vision changes   Objective:   No results found.  No results for input(s): "WBC", "HGB", "HCT", "PLT" in the last 72 hours.   No results for input(s): "NA", "K", "CL", "CO2", "GLUCOSE", "BUN", "CREATININE", "CALCIUM " in the last 72 hours.    Intake/Output Summary (Last 24 hours) at 11/15/2023 0825 Last data filed at 11/14/2023 1831 Gross per 24 hour  Intake 600 ml  Output --  Net 600 ml        Physical Exam: Vital Signs Blood pressure 110/72, pulse 95, temperature 98.4 F (36.9 C), temperature source Oral, resp. rate 18, height 5\' 10"  (1.778 m), weight 85.6 kg, SpO2 98%.        General: awake, alert, appropriate, Sitting up in w/c in room with husband on phone asking questions; NAD HENT: conjugate gaze; oropharynx moist CV: regular rate and rhythm- rate in 90's; no JVD Pulmonary: CTA B/L; no W/R/R- good air movement GI: soft, NT, ND, (+)BS- normoactive Psychiatric: appropriate Neurological: Ox3   Skin- incision looks great- scabbing up- no drainage or erythema- open to air- PICC line RUE- getting IV ABX PRIOR EXAMS: Skin: L-spine incision with dry dressing that's been reinforced, but it's C/D/I- pt c/o itching and can see scratch marks.  Neuro:     Mental Status: AAOx3 Speech/Languate:  fluent, follows simple commands CRANIAL NERVES: 2 through 12 grossly intact     MOTOR: RUE: 5/5 Deltoid, 5/5 Biceps, 5/5 Triceps,5/5 Grip LUE: 5/5 Deltoid, 5/5 Biceps, 5/5 Triceps, 5/5 Grip RLE: HF 4/5, KE 4+/5, ADF 5-/5, APF 5-/5 LLE: HF 4/5, KE 4+/5, ADF 5-/5, APF 5-/5     SENSORY: Normal to touch all 4  extremities, patient reports altered sensation in distal toes      Assessment/Plan: 1. Functional deficits which require 3+ hours per day of interdisciplinary therapy in a comprehensive inpatient rehab setting. Physiatrist is providing close team supervision and 24 hour management of active medical problems listed below. Physiatrist and rehab team continue to assess barriers to discharge/monitor patient progress toward functional and medical goals  Care Tool:  Bathing    Body parts bathed by patient: Right arm, Left arm, Chest, Abdomen, Front perineal area, Right upper leg, Left upper leg, Face, Right lower leg, Left lower leg, Buttocks   Body parts bathed by helper: Right lower leg, Left lower leg, Buttocks     Bathing assist Assist Level: Independent with assistive device     Upper Body Dressing/Undressing Upper body dressing   What is the patient wearing?: Pull over shirt    Upper body assist Assist Level: Independent    Lower Body Dressing/Undressing Lower body dressing      What is the patient wearing?: Underwear/pull up, Pants     Lower body assist Assist for lower body dressing: Independent with assitive device  Toileting Toileting    Toileting assist Assist for toileting: Independent with assistive device     Transfers Chair/bed transfer  Transfers assist     Chair/bed transfer assist level: Independent     Locomotion Ambulation   Ambulation assist      Assist level: Independent Assistive device: No Device Max distance: 300   Walk 10 feet activity   Assist     Assist level: Independent Assistive device: No Device   Walk 50 feet activity   Assist    Assist level: Independent Assistive device: No Device    Walk 150 feet activity   Assist    Assist level: Independent Assistive device: No Device    Walk 10 feet on uneven surface  activity   Assist     Assist level: Supervision/Verbal cueing      Wheelchair     Assist Is the patient using a wheelchair?: No             Wheelchair 50 feet with 2 turns activity    Assist            Wheelchair 150 feet activity     Assist          Blood pressure 110/72, pulse 95, temperature 98.4 F (36.9 C), temperature source Oral, resp. rate 18, height 5\' 10"  (1.778 m), weight 85.6 kg, SpO2 98%.  Medical Problem List and Plan: 1. Functional deficits secondary to strep pneumonaie MV endocarditis with epidural abscess extending from T11-L5 with resultant severe canal stenosis at L1-2 and L2-3 and moderate canal stenosis at L3-4.  Status post decompressive lumbar laminectomy, medial facetectomy and foraminotomies from T12-L3 with evacuation of epidural abscess 10/29/2023 as well as lumbar reexploration for wound drainage with blunt and sharp debridement of soft tissue followed by irrigation of the tissue 11/05/2023. NO BRACE REQUIRED             -patient may shower if lumbar drain/incision can be kept dry             -ELOS/Goals: 12-14d          5/22- d/c today- will have f/u with ID, NSU and Endo as well as f/u with PCP   2.  Antithrombotics: -DVT/anticoagulation:  Mechanical: Antiembolism stockings, thigh (TED hose) Bilateral lower extremities -5/16- started on tx dose lovenox  80mg  BID- will change to Eliquis next week for new DVTs- will need meds for 3 months- educated pt on this 5/20- will change to Tx dose Eliquis- Pharmacy has said copay is $35 5/22- will need to f/u with PCP in the next 30 days to get refills             -antiplatelet therapy: N/A 3. Pain Management: Celebrex  200 mg every 12 hours, Robaxin  and oxycodone  as needed  5/15- pt reports pain isn't bad- more itching  5/19- itching gone- pain minimal  4. Mood/Behavior/Sleep: Provide emotional support             -antipsychotic agents: N/A 5. Neuropsych/cognition: This patient is capable of making decisions on her own behalf. 6. Skin/Wound Care: Routine  skin checks 7. Fluids/Electrolytes/Nutrition: Routine in and outs with follow-up chemistries, continue vitamins and supplements.  8.  Mitral valve endocarditis.  IV Rocephin  2 g daily x 6 weeks USING 10/30/2023 AS DAY #1 Ending 12/11/2023 .  Follow-up infectious disease Dr. Artemio Larry as well as outpatient CVTS Dr. Luna Salinas.  Patient has PICC line -11/10/23 mentioned that she had some tremors while IV was infusion this morning--  not long lived. No other symptoms. Monitor for now, doubt this is a true reaction, likely just from cooler feel of fluids infusing. 5/19- no more tremors  5/22- will need to f/u with ID after d/c.  9.  Leukocytosis/acute blood loss anemia.  Follow-up CBC  5/15- WBC down to 10. 2 and Hb up to 10k   10.  Hypertension.  Norvasc  10 mg daily, lisinopril  5 mg daily.  Monitor with increased mobility             - Hospitalist recommends stop amlodipine  first if BP soft  -5/15-18 BP looks better- con't regimen  5/19-5/20  overall, BP looks good- con't to monitor trend  5/21- BP running higher slightly this AM, but doesn't follow a trend.  Vitals:   11/11/23 0533 11/11/23 1409 11/11/23 2019 11/12/23 0406  BP: 127/77 122/62 115/69 (!) 144/72   11/12/23 1431 11/12/23 2106 11/13/23 0452 11/13/23 2000  BP: 119/62 132/74 (!) 143/71 134/79   11/14/23 0459 11/14/23 1440 11/14/23 2001 11/15/23 0550  BP: 119/69 111/63 108/81 110/72    11.  Diabetes mellitus.  Hemoglobin A1c 8.7.  Currently on NovoLog  7 units 3 times daily, Semglee  30 units daily, Tradjenta  5 mg daily.  Patient on Glucophage  1000 mg twice daily and Januvia  100 mg daily prior to admission.  Monitor with increased activity 5/15- CBGs 102-202- will monitor trend for 24 hours before changing anything  5/16- CBGs 75-153 doing better- con't regimen  -11/11/23 CBGs great, monitor for now.   5/19- 1 value of 206- otherwise well controlled- con't regimen  5/20- CBGs looking better- no major elevations- con't regimen  5/21- pt not  going home on Insulin  after speaking with her endocrinologist- will be seen by Endo within 1 week after d/c   5/22- will f/u with Endo in the next week per Endo CBG (last 3)  Recent Labs    11/14/23 1640 11/14/23 2048 11/15/23 0647  GLUCAP 119* 163* 131*    12.  Hyperlipidemia.  Lipitor 10mg  nightly 13.  Acute encephalopathy/delirium.  Resolved. 14.  Proteinuria.  Continue lisinopril  5mg  daily 15. Constipation             Scheduled MiraLAX  daily, continue Senokot 2 BID  5/15- pt asked for Colace 100mg  BID- ordered for pt.   5/21- 2 Bms yesterday   I spent a total of  32  minutes on total care today- >50% coordination of care- due to  Answered all pt and husband questions- also d/w pt about f/u's and d/w PA about f/u's  The patient is medically ready for discharge to home and will not need follow-up with [facility]. In addition, they will need to follow up with their PCP, Neurosurgery and ID.    LOS: 8 days A FACE TO FACE EVALUATION WAS PERFORMED  Samar Venneman 11/15/2023, 8:25 AM

## 2023-11-15 NOTE — Progress Notes (Addendum)
 Inpatient Rehabilitation Care Coordinator Discharge Note   Patient Details  Name: Kristina Long MRN: [id number] Date of Birth: 1956/06/01   Discharge location: Home with spouse  Length of Stay: 8 days  Discharge activity level: Patient to discharge at an ambulatory level Supervision.All amb and transfers without AD. Pt completes all BADLs and IADLs at supervision/mod I level.  Home/community participation: Active  Patient response XB:JYNWGN Literacy - How often do you need to have someone help you when you read instructions, pamphlets, or other written material from your doctor or pharmacy?: Never  Patient response FA:OZHYQM Isolation - How often do you feel lonely or isolated from those around you?: Never  Services provided included: MD, PT, OT, RN, CM, Pharmacy, SW  Financial Services:  Financial Services Utilized: Medicare    Choices offered to/list presented to: Patient and spouse  Follow-up services arranged:  Outpatient IV abx: [facility]  Phone: [phone]    Outpatient Servicies: [facility] [phone]      Patient response to transportation need: Is the patient able to respond to transportation needs?: Yes In the past 12 months, has lack of transportation kept you from medical appointments or from getting medications?: Yes In the past 12 months, has lack of transportation kept you from meetings, work, or from getting things needed for daily living?: No   Patient/Family verbalized understanding of follow-up arrangements:  Yes Family education completed 11/14/23 including IV abx infusion instructions with Kristina Parson, RN.   Individual responsible for coordination of the follow-up plan: Spouse  Confirmed correct DME delivered: [name] 11/15/2023    Comments (or additional information):Confirmed  Summary of Stay    Date/Time Discharge Planning CSW  11/12/23 0914 D/c home w spouse; family education Wed. 11/14/23 1p-4p. IV  abx completed 11/10/23; co-pay confirmed if needed longer. DBS       [name]

## 2023-11-15 NOTE — Progress Notes (Addendum)
 Inpatient Rehabilitation Discharge Medication Review by a Pharmacist  A complete drug regimen review was completed for this patient to identify any potential clinically significant medication issues.  High Risk Drug Classes Is patient taking? Indication by Medication  Antipsychotic No   Anticoagulant Yes  Apixaban - DVT   Antibiotic Yes, as an intravenous medication Ceftriaxone  -strep pneumo bacteremia in setting of epidural abscess /MV endocarditis (Ceftriaxone  2g q24h per ID, plan for 6 wk using 5/6 as day #1.) End date/last dose due 12/10/23.  Opioid Yes Oxycodone  - pain  Antiplatelet No   Hypoglycemics/insulin  Yes sitagliptin , metformin  -diabetes  Vasoactive Medication Yes Amlodipine  , lisinopril -HTN  Chemotherapy No   Other Yes Acetaminophen - pain Methocarbamol  - muscle spasms Calcium  carbonate- supplement  Lipitor- HLD      Type of Medication Issue Identified Description of Issue Recommendation(s)  Drug Interaction(s) (clinically significant)     Duplicate Therapy     Allergy     No Medication Administration End Date     Incorrect Dose     Additional Drug Therapy Needed     Significant med changes from prior encounter (inform family/care partners about these prior to discharge). PTA meds discontinued- metaxolone, basal insulin , celebrex     Restart PTA meds when and if necessary during CIR admission or at time of discharge, if warranted.   Communicate to patient /family/ caregiver prior to discharge.   Other       Clinically significant medication issues were identified that warrant physician communication and completion of prescribed/recommended actions by midnight of the next day: Yes   Name of provider notified for urgent issues identified: Obed Bellows, PA   Provider Method of Notification: Secure chat    Pharmacist comments: Insulin  glargine PTA and inpatient as well as celebrex - provider confirmed it is to be discontinued at discharge with outpatient f/u    Time spent performing this drug regimen review (minutes):  25  Chrystie Crass, PharmD Clinical Pharmacist  11/15/2023 7:28 AM

## 2023-11-27 ENCOUNTER — Telehealth: Payer: Self-pay | Admitting: Cardiology

## 2023-11-27 ENCOUNTER — Telehealth: Payer: Self-pay | Admitting: *Deleted

## 2023-11-27 NOTE — Telephone Encounter (Signed)
 Patient identification verified by 2 forms. Kristina Duck, RN     Called and spoke to patient  Patient states:  - Admitted to hospital in early may for lower back pain. Had MRI and TEE completed during that time.  - Dr. Doria Garden office recommended patient follow up with Dr. Emmette Harms after her hospital admission for endocarditis and TEE.  - She is still taking antibiotic as prescribed.    Interventions/Plan: - Patient scheduled for first available with Dr. Emmette Harms in September.  - Encounter forwarded to primary cardiologist and nurse for review/f/u    Reviewed ED warning signs/precautions  Patient agrees with plan, no questions at this time

## 2023-11-27 NOTE — Telephone Encounter (Signed)
-----   Message from Zelphia Higashi sent at 11/26/2023  6:21 PM EDT ----- Regarding: RE: endocarditis f/u No she can f/u with Cardiology  [facility] ----- Message ----- From: Makaelah Cranfield A, RN Sent: 11/26/2023   4:01 PM EDT To: Zelphia Higashi, MD Subject: endocarditis f/u                               Dr. Luna Salinas,  Patient was an inpatient consult for MV endocarditis 5/9. She was last seen by you 5/13 & surgery was not indicated at that time. She was d/c'ed from inpatient rehab 5/22 and was told to call here for a follow up appt. Do you need to see her in follow up? If so, any additional tests needed (last ECHO was 5/8)?  Thanks, Shyana Kulakowski

## 2023-11-27 NOTE — Telephone Encounter (Signed)
 Patient aware of follow up recommendations.

## 2023-11-27 NOTE — Telephone Encounter (Signed)
Patient would like a call back to discuss echo results. 

## 2023-11-29 ENCOUNTER — Telehealth: Payer: Self-pay

## 2023-11-29 NOTE — Telephone Encounter (Signed)
 Jan with [facility] called:   Patient has been discharged from [facility]. She has refused the services at this time. Also she is not willing to commit to the time needed for the services.   Call back phone [phone].

## 2023-12-04 ENCOUNTER — Ambulatory Visit (INDEPENDENT_AMBULATORY_CARE_PROVIDER_SITE_OTHER): Admitting: Internal Medicine

## 2023-12-04 ENCOUNTER — Other Ambulatory Visit: Payer: Self-pay

## 2023-12-04 ENCOUNTER — Encounter: Payer: Self-pay | Admitting: Internal Medicine

## 2023-12-04 ENCOUNTER — Telehealth: Payer: Self-pay

## 2023-12-04 VITALS — BP 120/71 | HR 91 | Temp 97.9°F | Wt 186.0 lb

## 2023-12-04 DIAGNOSIS — G062 Extradural and subdural abscess, unspecified: Secondary | ICD-10-CM | POA: Diagnosis not present

## 2023-12-04 MED ORDER — CEFADROXIL 500 MG PO CAPS
1000.0000 mg | ORAL_CAPSULE | Freq: Two times a day (BID) | ORAL | 0 refills | Status: AC
Start: 2023-12-04 — End: 2023-12-18

## 2023-12-04 NOTE — Telephone Encounter (Signed)
 Per Dr. Zelda Hickman okay to pull picc after last dose 6/17. Message sent to [facility] team. Patient aware. Julien Odor, RMA

## 2023-12-04 NOTE — Progress Notes (Signed)
 Patient Active Problem List   Diagnosis Date Noted   Bacteremia 11/02/2023   Endocarditis, valve unspecified 11/01/2023   Bacteremia due to Streptococcus pneumoniae 10/30/2023   Epidural abscess 10/29/2023   S/P lumbar laminectomy 10/29/2023   Acute encephalopathy 10/28/2023   Low back pain 10/27/2023   Right flank pain 10/27/2023   Dyslipidemia 07/12/2021   Multinodular goiter 07/12/2021   Type 2 diabetes mellitus without complication, without long-term current use of insulin  (HCC) 08/31/2020   Type 2 diabetes mellitus with hyperglycemia, with long-term current use of insulin  (HCC) 05/16/2019    Patient's Medications  New Prescriptions   No medications on file  Previous Medications   ACETAMINOPHEN  (TYLENOL ) 325 MG TABLET    Take 1-2 tablets (325-650 mg total) by mouth every 4 (four) hours as needed for mild pain (pain score 1-3).   AMLODIPINE  (NORVASC ) 10 MG TABLET    Take 1 tablet (10 mg total) by mouth daily.   APIXABAN  (ELIQUIS ) 5 MG TABS TABLET    Take 1 tablet (5 mg total) by mouth 2 (two) times daily.   ATORVASTATIN  (LIPITOR) 10 MG TABLET    Take 1 tablet (10 mg total) by mouth at bedtime.   CALCIUM  CARBONATE (OS-CAL - DOSED IN MG OF ELEMENTAL CALCIUM ) 1250 (500 CA) MG TABLET    Take 0.5 tablets (625 mg total) by mouth daily with breakfast.   CEFTRIAXONE  (ROCEPHIN ) IVPB    Inject 2 g into the vein daily. Indication:  epidural abscess and MV endocarditis First Dose: Yes Last Day of Therapy:  12/11/2023 Labs - Once weekly:  CBC/D and BMP, Labs - Once weekly: ESR and CRP Method of administration: IV Push Method of administration may be changed at the discretion of home infusion pharmacist based upon assessment of the patient and/or caregiver's ability to self-administer the medication ordered.   CEFTRIAXONE  2 G IN SODIUM CHLORIDE  0.9 % 100 ML    Inject 2 g into the vein daily.   CLARITIN -D 12 HOUR 5-120 MG TABLET    Take 1 tablet by mouth 2 (two) times daily as  needed for allergies.   LANCETS (ONETOUCH DELICA PLUS LANCET33G) MISC    USE AS INSTRUCTED TO CHECK BLOOD SUGAR 2 TIMES PER DAY   LISINOPRIL  (ZESTRIL ) 5 MG TABLET    Take 1 tablet (5 mg total) by mouth daily.   METFORMIN  (GLUCOPHAGE -XR) 500 MG 24 HR TABLET    Take 2 tablets (1,000 mg total) by mouth 2 (two) times daily with a meal.   METHOCARBAMOL  (ROBAXIN ) 500 MG TABLET    Take 1 tablet (500 mg total) by mouth every 8 (eight) hours as needed.   ONETOUCH VERIO TEST STRIP    USE AS DIRECTED TO TEST BLOOD SUGAR TWICE A DAY   OXYCODONE  (OXY IR/ROXICODONE ) 5 MG IMMEDIATE RELEASE TABLET    Take 1 tablet (5 mg total) by mouth every 6 (six) hours as needed for moderate pain (pain score 4-6).   POLYETHYLENE GLYCOL (MIRALAX  / GLYCOLAX ) 17 G PACKET    Take 17 g by mouth daily as needed.   SITAGLIPTIN  (JANUVIA ) 100 MG TABLET    Take 1 tablet (100 mg total) by mouth daily.  Modified Medications   No medications on file  Discontinued Medications   No medications on file    Subjective: 68 year old female with past medical history as below presents for follow-up of strep pneumo bacteremia with native mitral valve endocarditis and vertebral infection with epidural  abscess.  Patient had undergone laminectomy/foraminotomy myotomy's with evacuation of abscess fluid or cultures growing strep pneumo on 5///25.  TEE has shown echo enhancing mass on mitral valve.  Orthopantogram without abnormalities.  Discharged on ceftriaxone  x 4 weeks.CT surgery recommended medical management and repeat TTE as well after treatment. Today: tolerating abx. Denies back pain, fever, chills   Review of Systems: Review of Systems  All other systems reviewed and are negative.   Past Medical History:  Diagnosis Date   Allergy    Diabetes mellitus without complication (HCC)     Social History   Tobacco Use   Smoking status: Never   Smokeless tobacco: Never  Vaping Use   Vaping status: Never Used  Substance Use Topics    Alcohol use: No   Drug use: No    Family History  Problem Relation Age of Onset   Diabetes Father    Diabetes Maternal Grandmother     Allergies  Allergen Reactions   Blueberry [Vaccinium Angustifolium] Swelling and Other (See Comments)    Lips swell    Health Maintenance  Topic Date Due   Medicare Annual Wellness (AWV)  Never done   Hepatitis C Screening  Never done   Pneumonia Vaccine 18+ Years old (1 of 2 - PCV) Never done   Colonoscopy  Never done   MAMMOGRAM  Never done   Zoster Vaccines- Shingrix (1 of 2) 04/11/2004   DEXA SCAN  Never done   Diabetic kidney evaluation - Urine ACR  01/04/2023   COVID-19 Vaccine (6 - 2024-25 season) 08/21/2023   INFLUENZA VACCINE  01/25/2024   FOOT EXAM  02/06/2024   HEMOGLOBIN A1C  04/27/2024   OPHTHALMOLOGY EXAM  08/01/2024   Diabetic kidney evaluation - eGFR measurement  11/11/2024   DTaP/Tdap/Td (2 - Td or Tdap) 01/29/2033   HPV VACCINES  Aged Out   Meningococcal B Vaccine  Aged Out    Objective:  Vitals:   12/04/23 1041  BP: 120/71  Pulse: 91  Temp: 97.9 F (36.6 C)  TempSrc: Oral  SpO2: 97%  Weight: 186 lb (84.4 kg)   Body mass index is 26.69 kg/m.  Physical Exam Constitutional:      Appearance: Normal appearance.  HENT:     Head: Normocephalic and atraumatic.     Right Ear: Tympanic membrane normal.     Left Ear: Tympanic membrane normal.     Nose: Nose normal.     Mouth/Throat:     Mouth: Mucous membranes are moist.   Eyes:     Extraocular Movements: Extraocular movements intact.     Conjunctiva/sclera: Conjunctivae normal.     Pupils: Pupils are equal, round, and reactive to light.    Cardiovascular:     Rate and Rhythm: Normal rate and regular rhythm.     Heart sounds: No murmur heard.    No friction rub. No gallop.  Pulmonary:     Effort: Pulmonary effort is normal.     Breath sounds: Normal breath sounds.  Abdominal:     General: Abdomen is flat.     Palpations: Abdomen is soft.    Musculoskeletal:        General: Normal range of motion.   Skin:    General: Skin is warm and dry.   Neurological:     General: No focal deficit present.     Mental Status: She is alert and oriented to person, place, and time.   Psychiatric:        Mood  and Affect: Mood normal.    Physical Exam   Lab Results Lab Results  Component Value Date   WBC 7.7 11/12/2023   HGB 9.2 (L) 11/12/2023   HCT 29.3 (L) 11/12/2023   MCV 86.9 11/12/2023   PLT 389 11/12/2023    Lab Results  Component Value Date   CREATININE 0.73 11/12/2023   BUN 19 11/12/2023   NA 136 11/12/2023   K 4.3 11/12/2023   CL 102 11/12/2023   CO2 25 11/12/2023    Lab Results  Component Value Date   ALT 23 11/08/2023   AST 27 11/08/2023   ALKPHOS 68 11/08/2023   BILITOT 0.4 11/08/2023    Lab Results  Component Value Date   CHOL 75 10/31/2023   HDL 26 (L) 10/31/2023   LDLCALC 37 10/31/2023   TRIG 62 10/31/2023   CHOLHDL 2.9 10/31/2023   No results found for: LABRPR, RPRTITER No results found for: HIV1RNAQUANT, HIV1RNAVL, CD4TABS   Problem List Items Addressed This Visit   None  Results   Assessment/Plan #Strep pneumonia bacteremia with native MV endocarditis and thoracic and lumbar epidural abscess.  # SP lumbar laminectomy foraminotomies T12-L3 with evacuation of abscess with OR cx+ strep pneumo on 10/29/23 -PT on  ctx till 6/17 to complete 6 weeks of abx. Will extend anohter 2 weeks to complete 8 weeks -> cefadroxil  1gm bid eot 7/1 Counseled to call cardiology, move up appt. (9/11). Follow up with CTS as noted per discharge summary F/u in one month to do labs offof abx.   #medication management back wound healed. 6/5 wbc 7.3, scr 0.72,esr 82, crp <1 Pull picc after last dose of abx 6/17  Orlie Bjornstad, MD [facility] 12/04/2023, 10:46 AM   I have personally spent 40 minutes involved in face-to-face and non-face-to-face  activities for this patient on the day of the visit. Professional time spent includes the following activities: Preparing to see the patient (review of tests), Obtaining and/or reviewing separately obtained history (admission/discharge record), Performing a medically appropriate examination and/or evaluation , Ordering medications/tests/procedures, referring and communicating with other health care professionals, Documenting clinical information in the EMR, Independently interpreting results (not separately reported), Communicating results to the patient/family/caregiver, Counseling and educating the patient/family/caregiver and Care coordination (not separately reported).

## 2023-12-04 NOTE — Telephone Encounter (Signed)
 Patient identification verified by 2 forms. Sims Duck, RN     Called and spoke to patient  Relayed provider recommendations  Patient aware:  - Dr. Emmette Harms would like for patient to be  seen sooner.    Intervention/Plan: - Patient scheduled for appt this Friday 6/13   Patient verbalized understanding, no questions at this time

## 2023-12-05 ENCOUNTER — Encounter: Payer: Self-pay | Admitting: Podiatry

## 2023-12-05 ENCOUNTER — Ambulatory Visit (INDEPENDENT_AMBULATORY_CARE_PROVIDER_SITE_OTHER): Admitting: Podiatry

## 2023-12-05 VITALS — Ht 70.0 in | Wt 186.0 lb

## 2023-12-05 DIAGNOSIS — E1140 Type 2 diabetes mellitus with diabetic neuropathy, unspecified: Secondary | ICD-10-CM

## 2023-12-05 DIAGNOSIS — Q828 Other specified congenital malformations of skin: Secondary | ICD-10-CM | POA: Diagnosis not present

## 2023-12-05 DIAGNOSIS — E1149 Type 2 diabetes mellitus with other diabetic neurological complication: Secondary | ICD-10-CM

## 2023-12-05 NOTE — Progress Notes (Signed)
 Subjective:   Patient ID: Kristina Long, female   DOB: 68 y.o.   MRN: [id number]   HPI Patient presents today to have the severe lesions I am on a blood thinner now I have diabetes and I am on a IV antibiotic   ROS      Objective:  Physical Exam  Neurovascular status unchanged severe thick lesions subthird subfifth metatarsal bilateral very painful when pressed     Assessment:  Chronic lesion formation bilateral with numerous risk factors associated     Plan:  Debridement of all lesions no iatrogenic bleeding reappoint routine care

## 2023-12-07 ENCOUNTER — Ambulatory Visit: Attending: Cardiology | Admitting: Cardiology

## 2023-12-07 ENCOUNTER — Encounter: Payer: Self-pay | Admitting: Cardiology

## 2023-12-07 VITALS — BP 124/70 | HR 95 | Ht 71.0 in | Wt 185.8 lb

## 2023-12-07 DIAGNOSIS — I330 Acute and subacute infective endocarditis: Secondary | ICD-10-CM | POA: Diagnosis not present

## 2023-12-07 DIAGNOSIS — E782 Mixed hyperlipidemia: Secondary | ICD-10-CM

## 2023-12-07 DIAGNOSIS — E1165 Type 2 diabetes mellitus with hyperglycemia: Secondary | ICD-10-CM

## 2023-12-07 DIAGNOSIS — Z794 Long term (current) use of insulin: Secondary | ICD-10-CM

## 2023-12-07 DIAGNOSIS — I340 Nonrheumatic mitral (valve) insufficiency: Secondary | ICD-10-CM | POA: Diagnosis not present

## 2023-12-07 NOTE — Patient Instructions (Addendum)
 Medication Instructions:  Your physician recommends that you continue on your current medications as directed. Please refer to the Current Medication list given to you today.  *If you need a refill on your cardiac medications before your next appointment, please call your pharmacy*   Testing/Procedures: Your physician has requested that you have an echocardiogram - after Jul 7th. Echocardiography is a painless test that uses sound waves to create images of your heart. It provides your doctor with information about the size and shape of your heart and how well your heart's chambers and valves are working. This procedure takes approximately one hour. There are no restrictions for this procedure. Please do NOT wear cologne, perfume, aftershave, or lotions (deodorant is allowed). Please arrive 15 minutes prior to your appointment time.  Please note: We ask at that you not bring children with you during ultrasound (echo/ vascular) testing. Due to room size and safety concerns, children are not allowed in the ultrasound rooms during exams. Our front office staff cannot provide observation of children in our lobby area while testing is being conducted. An adult accompanying a patient to their appointment will only be allowed in the ultrasound room at the discretion of the ultrasound technician under special circumstances. We apologize for any inconvenience.   Follow-Up: At [facility], you and your health needs are our priority.  As part of our continuing mission to provide you with exceptional heart care, our providers are all part of one team.  This team includes your primary Cardiologist (physician) and Advanced Practice Providers or APPs (Physician Assistants and Nurse Practitioners) who all work together to provide you with the care you need, when you need it.  Your next appointment:    After echo   Provider:   Kardie Tobb, DO

## 2023-12-07 NOTE — Progress Notes (Signed)
 Cardiology Office Note:    Date:  12/07/2023   ID:  Kristina Long, [place] [birth year], MRN [id number]  PCP:  Kristina Berke, MD  Cardiologist:  Jerryl Morin, DO  Electrophysiologist:  None   Referring MD: Kristina Berke, MD    I am doing well    History of Present Illness:    Kristina Long is a 68 y.o. female with a hx of hyperlipidemia, hypertension, diabetes mellitus, she was recently hospitalized again was treated for bacteremia at which time she did have a noted mobile mass on the anterior leaflet of the mitral valve impressive for infective endocarditis antibiotics was advised.  During that hospitalization she was admitted for lower back pain and right flank pain noted to have epidural abscess underwent decompressive lumbar laminectomy foraminotomies T12-L1 L1-2 L2-3 for evacuation of epidural abscess.  Then was found to have positive blood cultures for Staphylococcus pneumonia she was placed on antibiotic.  TEE was done showing mitral valve endocarditis.  Given the clinical situation it was recommended that she get extended antibiotics.  She is here today for follow-up visit.  She offers no complaints at this time.  She has been antibiotics as prescribed.  Overall she is doing well from a cardiovascular standpoint.    Past Medical History:  Diagnosis Date   Allergy    Diabetes mellitus without complication [facility])     Past Surgical History:  Procedure Laterality Date   LUMBAR LAMINECTOMY FOR EPIDURAL ABSCESS N/A 10/29/2023   Procedure: LUMBAR LAMINECTOMY FOR EPIDURAL ABSCESS;  Surgeon: Joaquin Mulberry, MD;  Location: [facility] OR;  Service: Neurosurgery;  Laterality: N/A;   LUMBAR WOUND DEBRIDEMENT N/A 11/05/2023   Procedure: LUMBAR WOUND DEBRIDEMENT;  Surgeon: Joaquin Mulberry, MD;  Location: [facility] OR;  Service: Neurosurgery;  Laterality: N/A;  IRRIGATION AND DEBRIDEMENT LUMBAR WOUND   TRANSESOPHAGEAL ECHOCARDIOGRAM (CATH LAB) N/A 11/01/2023   Procedure: TRANSESOPHAGEAL  ECHOCARDIOGRAM;  Surgeon: Jerryl Morin, DO;  Location: [facility] INVASIVE CV LAB;  Service: Cardiovascular;  Laterality: N/A;    Current Medications: Current Meds  Medication Sig   acetaminophen  (TYLENOL ) 325 MG tablet Take 1-2 tablets (325-650 mg total) by mouth every 4 (four) hours as needed for mild pain (pain score 1-3).   amLODipine  (NORVASC ) 10 MG tablet Take 1 tablet (10 mg total) by mouth daily.   apixaban  (ELIQUIS ) 5 MG TABS tablet Take 1 tablet (5 mg total) by mouth 2 (two) times daily.   atorvastatin  (LIPITOR) 10 MG tablet Take 1 tablet (10 mg total) by mouth at bedtime.   calcium  carbonate (OS-CAL - DOSED IN MG OF ELEMENTAL CALCIUM ) 1250 (500 Ca) MG tablet Take 0.5 tablets (625 mg total) by mouth daily with breakfast.   cefadroxil  (DURICEF) 500 MG capsule Take 2 capsules (1,000 mg total) by mouth 2 (two) times daily for 14 days.   cefTRIAXone  (ROCEPHIN ) IVPB Inject 2 g into the vein daily. Indication:  epidural abscess and MV endocarditis First Dose: Yes Last Day of Therapy:  12/11/2023 Labs - Once weekly:  CBC/D and BMP, Labs - Once weekly: ESR and CRP Method of administration: IV Push Method of administration may be changed at the discretion of home infusion pharmacist based upon assessment of the patient and/or caregiver's ability to self-administer the medication ordered.   cefTRIAXone  2 g in sodium chloride  0.9 % 100 mL Inject 2 g into the vein daily.   CLARITIN -D 12 HOUR 5-120 MG tablet Take 1 tablet by mouth 2 (two) times daily as needed for  allergies.   Lancets (ONETOUCH DELICA PLUS LANCET33G) MISC USE AS INSTRUCTED TO CHECK BLOOD SUGAR 2 TIMES PER DAY   lisinopril  (ZESTRIL ) 5 MG tablet Take 1 tablet (5 mg total) by mouth daily.   metFORMIN  (GLUCOPHAGE -XR) 500 MG 24 hr tablet Take 2 tablets (1,000 mg total) by mouth 2 (two) times daily with a meal.   ONETOUCH VERIO test strip USE AS DIRECTED TO TEST BLOOD SUGAR TWICE A DAY   polyethylene glycol (MIRALAX  / GLYCOLAX ) 17 g packet  Take 17 g by mouth daily as needed.   sitaGLIPtin  (JANUVIA ) 100 MG tablet Take 1 tablet (100 mg total) by mouth daily.     Allergies:   Blueberry [vaccinium angustifolium]   Social History   Socioeconomic History   Marital status: Married    Spouse name: Not on file   Number of children: 1   Years of education: Not on file   Highest education level: Not on file  Occupational History   Not on file  Tobacco Use   Smoking status: Never   Smokeless tobacco: Never  Vaping Use   Vaping status: Never Used  Substance and Sexual Activity   Alcohol use: No   Drug use: No   Sexual activity: Not on file  Other Topics Concern   Not on file  Social History Narrative   Not on file   Social Drivers of Health   Financial Resource Strain: Not on file  Food Insecurity: No Food Insecurity (10/27/2023)   Hunger Vital Sign    Worried About Running Out of Food in the Last Year: Never true    Ran Out of Food in the Last Year: Never true  Transportation Needs: No Transportation Needs (10/27/2023)   PRAPARE - Administrator, Civil Service (Medical): No    Lack of Transportation (Non-Medical): No  Physical Activity: Not on file  Stress: Not on file  Social Connections: Unknown (10/27/2023)   Social Connection and Isolation Panel    Frequency of Communication with Friends and Family: Patient unable to answer    Frequency of Social Gatherings with Friends and Family: Patient unable to answer    Attends Religious Services: Patient unable to answer    Active Member of Clubs or Organizations: Patient unable to answer    Attends Banker Meetings: Patient unable to answer    Marital Status: Married     Family History: The patient's family history includes Diabetes in her father and maternal grandmother.  ROS:   Review of Systems  Constitution: Negative for decreased appetite, fever and weight gain.  HENT: Negative for congestion, ear discharge, hoarse voice and sore throat.    Eyes: Negative for discharge, redness, vision loss in right eye and visual halos.  Cardiovascular: Negative for chest pain, dyspnea on exertion, leg swelling, orthopnea and palpitations.  Respiratory: Negative for cough, hemoptysis, shortness of breath and snoring.   Endocrine: Negative for heat intolerance and polyphagia.  Hematologic/Lymphatic: Negative for bleeding problem. Does not bruise/bleed easily.  Skin: Negative for flushing, nail changes, rash and suspicious lesions.  Musculoskeletal: Negative for arthritis, joint pain, muscle cramps, myalgias, neck pain and stiffness.  Gastrointestinal: Negative for abdominal pain, bowel incontinence, diarrhea and excessive appetite.  Genitourinary: Negative for decreased libido, genital sores and incomplete emptying.  Neurological: Negative for brief paralysis, focal weakness, headaches and loss of balance.  Psychiatric/Behavioral: Negative for altered mental status, depression and suicidal ideas.  Allergic/Immunologic: Negative for HIV exposure and persistent infections.  EKGs/Labs/Other Studies Reviewed:    The following studies were reviewed today:   EKG:  The ekg ordered today demonstrates   Recent Labs: 11/05/2023: Magnesium  2.2 11/08/2023: ALT 23 11/12/2023: BUN 19; Creatinine, Ser 0.73; Hemoglobin 9.2; Platelets 389; Potassium 4.3; Sodium 136  Recent Lipid Panel    Component Value Date/Time   CHOL 75 10/31/2023 0534   TRIG 62 10/31/2023 0534   HDL 26 (L) 10/31/2023 0534   CHOLHDL 2.9 10/31/2023 0534   VLDL 12 10/31/2023 0534   LDLCALC 37 10/31/2023 0534    Physical Exam:    VS:  BP 124/70 (BP Location: Left Arm, Patient Position: Sitting, Cuff Size: Normal)   Pulse 95   Ht 5' 11 (1.803 m)   Wt 185 lb 12.8 oz (84.3 kg)   LMP  (LMP Unknown)   SpO2 95%   BMI 25.91 kg/m     Wt Readings from Last 3 Encounters:  12/07/23 185 lb 12.8 oz (84.3 kg)  12/05/23 186 lb (84.4 kg)  12/04/23 186 lb (84.4 kg)     GEN: Well  nourished, well developed in no acute distress HEENT: Normal NECK: No JVD; No carotid bruits LYMPHATICS: No lymphadenopathy CARDIAC: S1S2 noted,RRR, no murmurs, rubs, gallops RESPIRATORY:  Clear to auscultation without rales, wheezing or rhonchi  ABDOMEN: Soft, non-tender, non-distended, +bowel sounds, no guarding. EXTREMITIES: No edema, No cyanosis, no clubbing MUSCULOSKELETAL:  No deformity  SKIN: Warm and dry NEUROLOGIC:  Alert and oriented x 3, non-focal PSYCHIATRIC:  Normal affect, good insight  ASSESSMENT:    1. Mitral valve insufficiency, unspecified etiology   2. Mitral valve vegetation   3. Type 2 diabetes mellitus with hyperglycemia, with long-term current use of insulin  (HCC)   4. Mixed hyperlipidemia    PLAN:    Clinically she looks well from a cardiovascular standpoint.  She does not appear to be volume overloaded.  No shortness of breath no chest pain. She continues to take her antibiotics.  Discussed with the patient about repeating imaging with a transesophageal echocardiogram she is a bit hesitant so is not unreasonable to start with a transthoracic echocardiogram.  Then follow-up with imaging as appropriate.   The patient is in agreement with the above plan. The patient left the office in stable condition.  The patient will follow up in   Medication Adjustments/Labs and Tests Ordered: Current medicines are reviewed at length with the patient today.  Concerns regarding medicines are outlined above.  Orders Placed This Encounter  Procedures   ECHOCARDIOGRAM COMPLETE   No orders of the defined types were placed in this encounter.   Patient Instructions  Medication Instructions:  Your physician recommends that you continue on your current medications as directed. Please refer to the Current Medication list given to you today.  *If you need a refill on your cardiac medications before your next appointment, please call your  pharmacy*   Testing/Procedures: Your physician has requested that you have an echocardiogram - after Jul 7th. Echocardiography is a painless test that uses sound waves to create images of your heart. It provides your doctor with information about the size and shape of your heart and how well your heart's chambers and valves are working. This procedure takes approximately one hour. There are no restrictions for this procedure. Please do NOT wear cologne, perfume, aftershave, or lotions (deodorant is allowed). Please arrive 15 minutes prior to your appointment time.  Please note: We ask at that you not bring children with you during ultrasound (echo/ vascular)  testing. Due to room size and safety concerns, children are not allowed in the ultrasound rooms during exams. Our front office staff cannot provide observation of children in our lobby area while testing is being conducted. An adult accompanying a patient to their appointment will only be allowed in the ultrasound room at the discretion of the ultrasound technician under special circumstances. We apologize for any inconvenience.   Follow-Up: At [facility], you and your health needs are our priority.  As part of our continuing mission to provide you with exceptional heart care, our providers are all part of one team.  This team includes your primary Cardiologist (physician) and Advanced Practice Providers or APPs (Physician Assistants and Nurse Practitioners) who all work together to provide you with the care you need, when you need it.  Your next appointment:    After echo   Provider:   Johnita Palleschi, DO       Adopting a Healthy Lifestyle.  Know what a healthy weight is for you (roughly BMI <25) and aim to maintain this   Aim for 7+ servings of fruits and vegetables daily   65-80+ fluid ounces of water or unsweet tea for healthy kidneys   Limit to max 1 drink of alcohol per day; avoid smoking/tobacco   Limit animal fats  in diet for cholesterol and heart health - choose grass fed whenever available   Avoid highly processed foods, and foods high in saturated/trans fats   Aim for low stress - take time to unwind and care for your mental health   Aim for 150 min of moderate intensity exercise weekly for heart health, and weights twice weekly for bone health   Aim for 7-9 hours of sleep daily   When it comes to diets, agreement about the perfect plan isnt easy to find, even among the experts. Experts at the [facility] developed an idea known as the Healthy Eating Plate. Just imagine a plate divided into logical, healthy portions.   The emphasis is on diet quality:   Load up on vegetables and fruits - one-half of your plate: Aim for color and variety, and remember that potatoes dont count.   Go for whole grains - one-quarter of your plate: Whole wheat, barley, wheat berries, quinoa, oats, brown rice, and foods made with them. If you want pasta, go with whole wheat pasta.   Protein power - one-quarter of your plate: Fish, chicken, beans, and nuts are all healthy, versatile protein sources. Limit red meat.   The diet, however, does go beyond the plate, offering a few other suggestions.   Use healthy plant oils, such as olive, canola, soy, corn, sunflower and peanut. Check the labels, and avoid partially hydrogenated oil, which have unhealthy trans fats.   If youre thirsty, drink water. Coffee and tea are good in moderation, but skip sugary drinks and limit milk and dairy products to one or two daily servings.   The type of carbohydrate in the diet is more important than the amount. Some sources of carbohydrates, such as vegetables, fruits, whole grains, and beans-are healthier than others.   Finally, stay active  Signed, Jerryl Morin, DO  12/07/2023 10:09 PM    [facility]

## 2024-01-02 ENCOUNTER — Other Ambulatory Visit: Payer: Self-pay | Admitting: Internal Medicine

## 2024-01-03 ENCOUNTER — Encounter: Payer: Self-pay | Admitting: Internal Medicine

## 2024-01-03 ENCOUNTER — Ambulatory Visit: Admitting: Internal Medicine

## 2024-01-03 ENCOUNTER — Other Ambulatory Visit: Payer: Self-pay

## 2024-01-03 VITALS — BP 117/73 | HR 79 | Temp 97.8°F | Ht 71.0 in | Wt 187.0 lb

## 2024-01-03 DIAGNOSIS — G062 Extradural and subdural abscess, unspecified: Secondary | ICD-10-CM | POA: Diagnosis not present

## 2024-01-03 NOTE — Progress Notes (Signed)
 Patient Active Problem List   Diagnosis Date Noted   Bacteremia 11/02/2023   Endocarditis, valve unspecified 11/01/2023   Bacteremia due to Streptococcus pneumoniae 10/30/2023   Epidural abscess 10/29/2023   S/P lumbar laminectomy 10/29/2023   Acute encephalopathy 10/28/2023   Low back pain 10/27/2023   Right flank pain 10/27/2023   Dyslipidemia 07/12/2021   Multinodular goiter 07/12/2021   Type 2 diabetes mellitus without complication, without long-term current use of insulin  (HCC) 08/31/2020   Type 2 diabetes mellitus with hyperglycemia, with long-term current use of insulin  (HCC) 05/16/2019    Patient's Medications  New Prescriptions   No medications on file  Previous Medications   ACETAMINOPHEN  (TYLENOL ) 325 MG TABLET    Take 1-2 tablets (325-650 mg total) by mouth every 4 (four) hours as needed for mild pain (pain score 1-3).   AMLODIPINE  (NORVASC ) 10 MG TABLET    Take 1 tablet (10 mg total) by mouth daily.   APIXABAN  (ELIQUIS ) 5 MG TABS TABLET    Take 1 tablet (5 mg total) by mouth 2 (two) times daily.   ATORVASTATIN  (LIPITOR) 10 MG TABLET    Take 1 tablet (10 mg total) by mouth at bedtime.   CALCIUM  CARBONATE (OS-CAL - DOSED IN MG OF ELEMENTAL CALCIUM ) 1250 (500 CA) MG TABLET    Take 0.5 tablets (625 mg total) by mouth daily with breakfast.   CLARITIN -D 12 HOUR 5-120 MG TABLET    Take 1 tablet by mouth 2 (two) times daily as needed for allergies.   LANCETS (ONETOUCH DELICA PLUS LANCET33G) MISC    USE AS INSTRUCTED TO CHECK BLOOD SUGAR 2 TIMES PER DAY   LISINOPRIL  (ZESTRIL ) 5 MG TABLET    Take 1 tablet (5 mg total) by mouth daily.   METFORMIN  (GLUCOPHAGE -XR) 500 MG 24 HR TABLET    TAKE 2 TABLETS BY MOUTH TWICE A DAY (AFTERNOON AND NIGHT) WITH A MEAL   METHOCARBAMOL  (ROBAXIN ) 500 MG TABLET    Take 1 tablet (500 mg total) by mouth every 8 (eight) hours as needed.   ONETOUCH VERIO TEST STRIP    USE AS DIRECTED TO TEST BLOOD SUGAR TWICE A DAY   OXYCODONE  (OXY  IR/ROXICODONE ) 5 MG IMMEDIATE RELEASE TABLET    Take 1 tablet (5 mg total) by mouth every 6 (six) hours as needed for moderate pain (pain score 4-6).   POLYETHYLENE GLYCOL (MIRALAX  / GLYCOLAX ) 17 G PACKET    Take 17 g by mouth daily as needed.   SITAGLIPTIN  (JANUVIA ) 100 MG TABLET    Take 1 tablet (100 mg total) by mouth daily.  Modified Medications   No medications on file  Discontinued Medications   No medications on file    Subjective: 68 year old female with past medical history as below presents for follow-up of strep pneumo bacteremia with native mitral valve endocarditis and vertebral infection with epidural abscess.  Patient had undergone laminectomy/foraminotomy myotomy's with evacuation of abscess fluid or cultures growing strep pneumo on 5///25.  TEE has shown echo enhancing mass on mitral valve.  Orthopantogram without abnormalities.  Discharged on ceftriaxone  x 4 weeks.CT surgery recommended medical management and repeat TTE as well after treatment. 6/10: tolerating abx. Denies back pain, fever, chills Today 7/10: Dong well no new complaints. No fevers or chills Review of Systems: Review of Systems  All other systems reviewed and are negative.   Past Medical History:  Diagnosis Date   Allergy    Diabetes mellitus without  complication (HCC)     Social History   Tobacco Use   Smoking status: Never   Smokeless tobacco: Never  Vaping Use   Vaping status: Never Used  Substance Use Topics   Alcohol use: No   Drug use: No    Family History  Problem Relation Age of Onset   Diabetes Father    Diabetes Maternal Grandmother     Allergies  Allergen Reactions   Blueberry [Vaccinium Angustifolium] Swelling and Other (See Comments)    Lips swell    Health Maintenance  Topic Date Due   Medicare Annual Wellness (AWV)  Never done   Diabetic kidney evaluation - Urine ACR  Never done   Hepatitis C Screening  Never done   Colonoscopy  Never done   MAMMOGRAM  Never done    Zoster Vaccines- Shingrix (1 of 2) 04/11/2004   DEXA SCAN  Never done   COVID-19 Vaccine (6 - 2024-25 season) 08/21/2023   INFLUENZA VACCINE  01/25/2024   FOOT EXAM  02/06/2024   HEMOGLOBIN A1C  04/27/2024   OPHTHALMOLOGY EXAM  08/01/2024   Diabetic kidney evaluation - eGFR measurement  11/11/2024   DTaP/Tdap/Td (3 - Td or Tdap) 01/29/2033   Pneumococcal Vaccine: 50+ Years  Completed   Hepatitis B Vaccines  Aged Out   HPV VACCINES  Aged Out   Meningococcal B Vaccine  Aged Out    Objective:  Vitals:   01/03/24 1100  BP: 117/73  Pulse: 79  Temp: 97.8 F (36.6 C)  TempSrc: Temporal  SpO2: 99%  Weight: 187 lb (84.8 kg)  Height: 5' 11 (1.803 m)   Body mass index is 26.08 kg/m.  Physical Exam Constitutional:      Appearance: Normal appearance.  HENT:     Head: Normocephalic and atraumatic.     Right Ear: Tympanic membrane normal.     Left Ear: Tympanic membrane normal.     Nose: Nose normal.     Mouth/Throat:     Mouth: Mucous membranes are moist.  Eyes:     Extraocular Movements: Extraocular movements intact.     Conjunctiva/sclera: Conjunctivae normal.     Pupils: Pupils are equal, round, and reactive to light.  Cardiovascular:     Rate and Rhythm: Normal rate and regular rhythm.     Heart sounds: No murmur heard.    No friction rub. No gallop.  Pulmonary:     Effort: Pulmonary effort is normal.     Breath sounds: Normal breath sounds.  Abdominal:     General: Abdomen is flat.     Palpations: Abdomen is soft.  Musculoskeletal:        General: Normal range of motion.  Skin:    General: Skin is warm and dry.  Neurological:     General: No focal deficit present.     Mental Status: She is alert and oriented to person, place, and time.  Psychiatric:        Mood and Affect: Mood normal.    Physical Exam   Lab Results Lab Results  Component Value Date   WBC 7.7 11/12/2023   HGB 9.2 (L) 11/12/2023   HCT 29.3 (L) 11/12/2023   MCV 86.9 11/12/2023    PLT 389 11/12/2023    Lab Results  Component Value Date   CREATININE 0.73 11/12/2023   BUN 19 11/12/2023   NA 136 11/12/2023   K 4.3 11/12/2023   CL 102 11/12/2023   CO2 25 11/12/2023    Lab Results  Component Value Date   ALT 23 11/08/2023   AST 27 11/08/2023   ALKPHOS 68 11/08/2023   BILITOT 0.4 11/08/2023    Lab Results  Component Value Date   CHOL 75 10/31/2023   HDL 26 (L) 10/31/2023   LDLCALC 37 10/31/2023   TRIG 62 10/31/2023   CHOLHDL 2.9 10/31/2023   No results found for: LABRPR, RPRTITER No results found for: HIV1RNAQUANT, HIV1RNAVL, CD4TABS   Problem List Items Addressed This Visit   None  Results   Assessment/Plan #Strep pneumonia bacteremia with native MV endocarditis and thoracic and lumbar epidural abscess.  # SP lumbar laminectomy foraminotomies T12-L3 with evacuation of abscess with OR cx+ strep pneumo on 10/29/23 -PT on  ctx till 6/17 to complete 6 weeks of abx. Will extend anohter 2 weeks to complete 8 weeks -> cefadroxil  1gm bid eot 7/1 Seen by Cardiology on 6/13 ->tte scheduled, pt wanted to avoid TEE for now Plan: - labs offof abx.  -F/U id prn, pending tte results  Loney Stank, MD [facility] 01/03/2024, 11:03 AM  I have personally spent 35 minutes involved in face-to-face and non-face-to-face activities for this patient on the day of the visit. Professional time spent includes the following activities: Preparing to see the patient (review of tests), Obtaining and/or reviewing separately obtained history (admission/discharge record), Performing a medically appropriate examination and/or evaluation , Ordering medications/tests/procedures, referring and communicating with other health care professionals, Documenting clinical information in the EMR, Independently interpreting results (not separately reported), Communicating results to the patient/family/caregiver, Counseling and educating  the patient/family/caregiver and Care coordination (not separately reported).

## 2024-01-04 LAB — BASIC METABOLIC PANEL WITH GFR
BUN: 18 mg/dL (ref 7–25)
CO2: 27 mmol/L (ref 20–32)
Calcium: 10 mg/dL (ref 8.6–10.4)
Chloride: 106 mmol/L (ref 98–110)
Creat: 0.73 mg/dL (ref 0.50–1.05)
Glucose, Bld: 93 mg/dL (ref 65–99)
Potassium: 4 mmol/L (ref 3.5–5.3)
Sodium: 141 mmol/L (ref 135–146)
eGFR: 89 mL/min/1.73m2 (ref >=60)

## 2024-01-04 LAB — CBC WITH DIFFERENTIAL/PLATELET
Absolute Lymphocytes: 2332 {cells}/uL (ref 850–3900)
Absolute Monocytes: 462 {cells}/uL (ref 200–950)
Basophils Absolute: 28 {cells}/uL (ref 0–200)
Basophils Relative: 0.4 %
Eosinophils Absolute: 152 {cells}/uL (ref 15–500)
Eosinophils Relative: 2.2 %
HCT: 35.9 % (ref 35.0–45.0)
Hemoglobin: 10.8 g/dL — ABNORMAL LOW (ref 11.7–15.5)
MCH: 26.8 pg — ABNORMAL LOW (ref 27.0–33.0)
MCHC: 30.1 g/dL — ABNORMAL LOW (ref 32.0–36.0)
MCV: 89.1 fL (ref 80.0–100.0)
MPV: 10.6 fL (ref 7.5–12.5)
Monocytes Relative: 6.7 %
Neutro Abs: 3926 {cells}/uL (ref 1500–7800)
Neutrophils Relative %: 56.9 %
Platelets: 270 Thousand/uL (ref 140–400)
RBC: 4.03 Million/uL (ref 3.80–5.10)
RDW: 14.1 % (ref 11.0–15.0)
Total Lymphocyte: 33.8 %
WBC: 6.9 Thousand/uL (ref 3.8–10.8)

## 2024-01-04 LAB — SEDIMENTATION RATE: Sed Rate: 29 mm/h (ref 0–30)

## 2024-01-04 LAB — C-REACTIVE PROTEIN: CRP: <3 mg/L (ref <8.0)

## 2024-01-19 ENCOUNTER — Other Ambulatory Visit: Payer: Self-pay | Admitting: Internal Medicine

## 2024-01-22 ENCOUNTER — Encounter: Payer: Self-pay | Admitting: Internal Medicine

## 2024-01-22 ENCOUNTER — Ambulatory Visit (INDEPENDENT_AMBULATORY_CARE_PROVIDER_SITE_OTHER): Admitting: Internal Medicine

## 2024-01-22 VITALS — BP 116/72 | HR 89 | Ht 71.0 in | Wt 186.0 lb

## 2024-01-22 DIAGNOSIS — E785 Hyperlipidemia, unspecified: Secondary | ICD-10-CM | POA: Diagnosis not present

## 2024-01-22 DIAGNOSIS — Z794 Long term (current) use of insulin: Secondary | ICD-10-CM

## 2024-01-22 DIAGNOSIS — E1142 Type 2 diabetes mellitus with diabetic polyneuropathy: Secondary | ICD-10-CM | POA: Diagnosis not present

## 2024-01-22 DIAGNOSIS — E1165 Type 2 diabetes mellitus with hyperglycemia: Secondary | ICD-10-CM | POA: Diagnosis not present

## 2024-01-22 LAB — POCT GLYCOSYLATED HEMOGLOBIN (HGB A1C): Hemoglobin A1C: 6.8 % — AB (ref 4.0–5.6)

## 2024-01-22 LAB — POCT GLUCOSE (DEVICE FOR HOME USE)

## 2024-01-22 MED ORDER — ACCU-CHEK GUIDE TEST VI STRP: 1.0000 | ORAL_STRIP | Freq: Every day | 12 refills | Status: AC

## 2024-01-22 MED ORDER — ACCU-CHEK GUIDE W/DEVICE KIT
1.0000 | PACK | Freq: Every day | 0 refills | Status: DC
Start: 2024-01-22 — End: 2024-03-03

## 2024-01-22 MED ORDER — ATORVASTATIN CALCIUM 10 MG PO TABS: 10.0000 mg | ORAL_TABLET | Freq: Every day | ORAL | 2 refills | Status: AC

## 2024-01-22 MED ORDER — METFORMIN HCL ER 500 MG PO TB24: 1000.0000 mg | ORAL_TABLET | Freq: Every day | ORAL | 2 refills | Status: DC

## 2024-01-22 MED ORDER — SITAGLIPTIN PHOSPHATE 100 MG PO TABS: 100.0000 mg | ORAL_TABLET | Freq: Every day | ORAL | 0 refills | Status: DC

## 2024-01-22 NOTE — Patient Instructions (Signed)
  Continue Januvia  100 mg, 1 tablet daily Continue Metformin  500 mg , two tablets in the morning     HOW TO TREAT LOW BLOOD SUGARS (Blood sugar LESS THAN 70 MG/DL) Please follow the RULE OF 15 for the treatment of hypoglycemia treatment (when your (blood sugars are less than 70 mg/dL)   STEP 1: Take 15 grams of carbohydrates when your blood sugar is low, which includes:  3-4 GLUCOSE TABS  OR 3-4 OZ OF JUICE OR REGULAR SODA OR ONE TUBE OF GLUCOSE GEL    STEP 2: RECHECK blood sugar in 15 MINUTES STEP 3: If your blood sugar is still low at the 15 minute recheck --> then, go back to STEP 1 and treat AGAIN with another 15 grams of carbohydrates.

## 2024-01-22 NOTE — Progress Notes (Signed)
 Name: Kristina Long  Age/ Sex: 68 y.o., female   MRN/ DOB: [id number], [birth year]     PCP: Delayne Artist PARAS, MD   Reason for Endocrinology Evaluation: Type 2 Diabetes Mellitus  Initial Endocrine Consultative Visit: 04/11/2018    PATIENT IDENTIFIER: Kristina Long is a 68 y.o. female with a past medical history of T2DM, Seasonal allergies and OSA. The patient has followed with Endocrinology clinic since 04/12/2019 for consultative assistance with management of her diabetes.  DIABETIC HISTORY:  Ms. Delmont was diagnosed with DM in 2013. She has been on Metformin  since her diagnosis. She was prescribed Pioglitazone by her PCP in the past  but she never took it after she read the cardiac side effects. Her hemoglobin A1c has ranged from  in 7.3% in 2018, peaking at 10.5 % in 2013.  Januvia  was cost prohibitive and by 04/2019 was replaced by Glipizide   Restarted januvia  06/2020  Attempted to switch Januvia  to Rybelsus  but she could not tolerated by 02/2022 due to dry mouth   Attempted to prescribe glimepiride  01/2023 but she did not started   THYROID  HISTORY: During evaluation for vertigo a CT Scan showed an incidental finding of  multiple thyroid  nodules with the largest 2.1 cm on the right, this prompted a thyroid  ultrasound which was done 06/30/2021 revealing MNG with right superior and left inferior nodules meeting FNA criteria.   She is s/p FNA of the right superior 1.5 cm, left inferior 2.1 cm nodules with scant cellularity.  Patient declined repeat FNA and opted for serial ultrasounds   SUBJECTIVE:     During the last visit (02/06/2023): A1c 7.7%    Today (01/22/2024): [name] is here for a follow up on diabetes management.  She has NOT ben to our clinic in 11 months. She checks glucose occasionally. The patient has not had hypoglycemic episodes since the last clinic visit.    She has been noted with multiple ED visits due to variable reasons  including back and flank pains. She is S/P lumbar laminectomy due to epidural abscess 10/2023 She was also dx with MV endocarditis on TEE  She was discharged on metformin  and Januvia    Back pain has improved since surgery  She completed Abx  Denies Nausea , vomiting  Denies constipation or diarrhea  Has numbness of the heels     HOME ENDOCRINE REGIMEN:  Metformin  500 mg 2 tab QAM  Januvia  100 mg daily  Atorvastatin  10 mg daily     METER DOWNLOAD SUMMARY: Did not bring     DIABETIC COMPLICATIONS: Microvascular complications:    Denies: Neuropathy, retinopathy , nephropthay Last eye exam: Completed 06/2022   Macrovascular complications:    Denies: CAD, CVA, PVD   HISTORY:  Past Medical History:  Past Medical History:  Diagnosis Date   Allergy    Diabetes mellitus without complication (HCC)    Past Surgical History:  Past Surgical History:  Procedure Laterality Date   LUMBAR LAMINECTOMY FOR EPIDURAL ABSCESS N/A 10/29/2023   Procedure: LUMBAR LAMINECTOMY FOR EPIDURAL ABSCESS;  Surgeon: Joshua Alm Hamilton, MD;  Location: [facility] OR;  Service: Neurosurgery;  Laterality: N/A;   LUMBAR WOUND DEBRIDEMENT N/A 11/05/2023   Procedure: LUMBAR WOUND DEBRIDEMENT;  Surgeon: Joshua Alm Hamilton, MD;  Location: [facility] OR;  Service: Neurosurgery;  Laterality: N/A;  IRRIGATION AND DEBRIDEMENT LUMBAR WOUND   TRANSESOPHAGEAL ECHOCARDIOGRAM (CATH LAB) N/A 11/01/2023   Procedure: TRANSESOPHAGEAL ECHOCARDIOGRAM;  Surgeon: Tobb, Kardie,  DO;  Location: [facility] INVASIVE CV LAB;  Service: Cardiovascular;  Laterality: N/A;   Social History:  reports that she has never smoked. She has never used smokeless tobacco. She reports that she does not drink alcohol and does not use drugs. Family History:  Family History  Problem Relation Age of Onset   Diabetes Father    Diabetes Maternal Grandmother      HOME MEDICATIONS: Allergies as of 01/22/2024       Reactions   Blueberry [vaccinium Angustifolium] Swelling,  Other (See Comments)   Lips swell        Medication List        Accurate as of January 22, 2024  2:22 PM. If you have any questions, ask your nurse or doctor.          acetaminophen  325 MG tablet Commonly known as: TYLENOL  Take 1-2 tablets (325-650 mg total) by mouth every 4 (four) hours as needed for mild pain (pain score 1-3).   amLODipine  10 MG tablet Commonly known as: NORVASC  Take 1 tablet (10 mg total) by mouth daily.   atorvastatin  10 MG tablet Commonly known as: LIPITOR TAKE 1 TABLET BY MOUTH EVERY DAY   calcium  carbonate 1250 (500 Ca) MG tablet Commonly known as: OS-CAL - dosed in mg of elemental calcium  Take 0.5 tablets (625 mg total) by mouth daily with breakfast.   Claritin -D 12 Hour 5-120 MG tablet Generic drug: loratadine -pseudoephedrine Take 1 tablet by mouth 2 (two) times daily as needed for allergies.   Eliquis  5 MG Tabs tablet Generic drug: apixaban  Take 1 tablet (5 mg total) by mouth 2 (two) times daily.   Januvia  100 MG tablet Generic drug: sitaGLIPtin  Take 1 tablet (100 mg total) by mouth daily.   lisinopril  5 MG tablet Commonly known as: ZESTRIL  Take 1 tablet (5 mg total) by mouth daily.   metFORMIN  500 MG 24 hr tablet Commonly known as: GLUCOPHAGE -XR TAKE 2 TABLETS BY MOUTH TWICE A DAY (AFTERNOON AND NIGHT) WITH A MEAL   methocarbamol  500 MG tablet Commonly known as: ROBAXIN  Take 1 tablet (500 mg total) by mouth every 8 (eight) hours as needed.   OneTouch Delica Plus Lancet33G Misc USE AS INSTRUCTED TO CHECK BLOOD SUGAR 2 TIMES PER DAY   OneTouch Verio test strip Generic drug: glucose blood USE AS DIRECTED TO TEST BLOOD SUGAR TWICE A DAY   oxyCODONE  5 MG immediate release tablet Commonly known as: Oxy IR/ROXICODONE  Take 1 tablet (5 mg total) by mouth every 6 (six) hours as needed for moderate pain (pain score 4-6).   polyethylene glycol 17 g packet Commonly known as: MIRALAX  / GLYCOLAX  Take 17 g by mouth daily as needed.          OBJECTIVE:   Vital Signs: BP 116/72 (BP Location: Left Arm, Patient Position: Sitting, Cuff Size: Normal)   Pulse 89   Ht 5' 11 (1.803 m)   Wt 186 lb (84.4 kg)   LMP  (LMP Unknown)   SpO2 99%   BMI 25.94 kg/m   Wt Readings from Last 3 Encounters:  01/22/24 186 lb (84.4 kg)  01/03/24 187 lb (84.8 kg)  12/07/23 185 lb 12.8 oz (84.3 kg)     Exam: General: Pt appears well and is in NAD  Lungs: Clear with good BS bilat   Heart: RRR with normal    Extremities: No pretibial edema.  Neuro: MS is good with appropriate affect, pt is alert and Ox3    DM foot exam: 02/06/2023  The skin of the feet is without sores or ulcerations, but with multiple callous formation on the right plantar surface, right 3rd and 5th toe The pedal pulses are 2+ on right and 2+ on left. The sensation is intact to a screening 5.07, 10 gram monofilament bilaterally    DATA REVIEWED:   Latest Reference Range & Units 01/03/24 11:18  Sodium 135 - 146 mmol/L 141  Potassium 3.5 - 5.3 mmol/L 4.0  Chloride 98 - 110 mmol/L 106  CO2 20 - 32 mmol/L 27  Glucose 65 - 99 mg/dL 93  BUN 7 - 25 mg/dL 18  Creatinine 9.49 - 8.94 mg/dL 9.26  Calcium  8.6 - 10.4 mg/dL 89.9  BUN/Creatinine Ratio 6 - 22 (calc) SEE NOTE:  eGFR > OR = 60 mL/min/1.55m2 89         Thyroid  ULtrasound 03/27/2023  Narrative & Impression  CLINICAL DATA:  68 year old female with a history of thyroid  nodules   EXAM: THYROID  ULTRASOUND   TECHNIQUE: Ultrasound examination of the thyroid  gland and adjacent soft tissues was performed.   COMPARISON:  01/04/2022, 06/30/2021, CT 04/12/2021   Biopsy 07/26/2021, right superior thyroid  nodule 3, left inferior thyroid  nodule 9   FINDINGS: Parenchymal Echotexture: Mildly heterogenous   Isthmus: 0.6 cm   Right lobe: 6.2 cm x 2.8 cm x 3.4 cm   Left lobe: 6.2 cm x 2.8 cm x 3.1 cm   _________________________________________________________   Estimated total number of nodules  >/= 1 cm: 6-10   Number of spongiform nodules >/=  2 cm not described below (TR1): 0   Number of mixed cystic and solid nodules >/= 1.5 cm not described below (TR2): 0   _________________________________________________________   Nodule 1 in the isthmus 1.3 cm, spongiform and does not meet criteria for surveillance.   Nodule 2 in the isthmus, 1.3 cm, unchanged, TR 4. Meets criteria for surveillance.   Nodule labeled 3 in the left isthmus, 2.1 cm (previously 11). Nodule has undergone prior biopsy and remains unchanged. Assuming benign result no further specific follow-up would be indicated   Nodule labeled 4 superior right thyroid , 1.6 cm and unchanged. Nodule has spongiform characteristics and does not meet criteria for surveillance.   Nodule labeled 5, superior right thyroid , 1.2 cm x 1.4 cm x 0.8 cm (previously 4). Nodule unchanged having undergone prior biopsy. Assuming benign result no further specific follow-up would be indicated.   Nodule 6, mid right thyroid , 1.7 cm x 1.5 cm x 2.0 cm. Nodule remains TR 3 and meets criteria for surveillance.   Nodule labeled 7 inferior right thyroid , 2.1 cm x 2.2 cm x 2.3 cm, unchanged. Nodule is TR 3 and meets criteria for surveillance.   Nodule labeled 8, superior left thyroid , 2.1 cm x 2.1 cm x 1.3 cm, unchanged. Nodule has clear spongiform characteristics on the prior (image 83/117 01/04/2022). Does not meet criteria for further surveillance.   Nodule labeled 9, mid left thyroid  1.9 cm x 1.4 cm x 1.5 cm, unchanged. Nodule is TR 3 and meets criteria for surveillance.   Nodule labeled 10, inferior left thyroid , 1.5 cm x 1.2 cm x 1.3 cm again this is favored to represent a pseudo nodule.   No adenopathy   IMPRESSION: Multinodular thyroid  again demonstrated as above.   Isthmic thyroid  nodule (labeled 2, 1.3 cm), right mid thyroid  nodule (labeled 6, 1.7 cm), right inferior thyroid  nodule (labeled 7, 2.1 cm), left mid thyroid   nodule (labeled 9, 1.9 cm)  FNA right superior nodule 07/26/2021  Clinical History: Right; Superior 1.5cm; Other 2 dimensions: 1.4 x  0.8cm  FINAL MICROSCOPIC DIAGNOSIS:  - Scant follicular epithelium present (Bethesda category I)   FNA Left inferior nodule 07/26/2021  Clinical History: Left; Inferior 2.1cm; Other 2 dimensions: 1.7 x 1.6cm  FINAL MICROSCOPIC DIAGNOSIS:  - Scant follicular epithelium present (Bethesda category I)   Old records , labs and images have been reviewed.    ASSESSMENT / PLAN / RECOMMENDATIONS:   1) Type 2 Diabetes Mellitus, Optimally controlled, neuropathic complications - Most recent A1c of 6.8 %. Goal A1c < 7.0 %.    -A1c at goal with lifestyle changes -I have attempted to switch Januvia  to Rybelsus  but she developed dry mouth and opted to switch back to Januvia  2023 -I have attempted to prescribe glimepiride  in the past but she did not take it - She has self decreased metformin  by 50% - No changes  MEDICATIONS:  -Continue Januvia  100 mg daily -Continue Metformin  500 mg 2 tablets every morning   EDUCATION / INSTRUCTIONS: BG monitoring instructions: Patient is instructed to check her blood sugars 1 times a day, fasting Call [facility] if: BG persistently < 70  I reviewed the Rule of 15 for the treatment of hypoglycemia in detail with the patient. Literature supplied.  2) Diabetic complications:  Eye: She does not have known diabetic retinopathy.   Neuro/ Feet: does have known diabetic peripheral neuropathy, based on symptoms of numbness at the heel during 12/2023 visit Renal: Patient does not have known baseline CKD.      3) Dyslipidemia:  - LDL was 95 mg/dL in 0/7977 and we started small dose of atorvastatin   - LDL at 37 Mg/DL from May, 7974   Medication   Continue Atorvastatin  10 mg daily   4) Multinodular Goiter :  - No local neck symptoms  - She is S/P  FNA of the right superior and left inferior  nodules in January 2023 with scant cellularity.  The patient opted not to proceed with a repeat FNA  - Repeat ultrasound 03/2023 continue to show stability    F/U in 4 months    Signed electronically by: Stefano Redgie Butts, MD  [facility]  [facility] [address] [place] [place], [place] [address] Phone: [phone] FAX: [phone]   CC: Delayne Artist PARAS, MD [address] [place] [address] Phone: [phone]  Fax: [phone]  Return to Endocrinology clinic as below: Future Appointments  Date Time Provider Department Center  01/22/2024  2:40 PM Tore Carreker, Donell Redgie, MD [facility] None  01/23/2024  9:30 AM [facility]  02/12/2024  9:00 AM Tobb, Kardie, DO [facility]

## 2024-01-23 ENCOUNTER — Ambulatory Visit (HOSPITAL_COMMUNITY)
Admission: RE | Admit: 2024-01-23 | Discharge: 2024-01-23 | Disposition: A | Discharge status: Home / Self Care | Source: Ambulatory Visit | Attending: Cardiovascular Disease | Admitting: Cardiovascular Disease

## 2024-01-23 ENCOUNTER — Ambulatory Visit: Payer: Self-pay | Admitting: Internal Medicine

## 2024-01-23 DIAGNOSIS — I340 Nonrheumatic mitral (valve) insufficiency: Secondary | ICD-10-CM | POA: Diagnosis present

## 2024-01-23 LAB — ECHOCARDIOGRAM COMPLETE
Area-P 1/2: 4.1 cm2
S' Lateral: 3.4 cm

## 2024-01-23 LAB — MICROALBUMIN / CREATININE URINE RATIO
Creatinine, Urine: 116 mg/dL (ref 20–275)
Microalb Creat Ratio: 4 mg/g{creat} (ref <30)
Microalb, Ur: 0.5 mg/dL

## 2024-01-30 ENCOUNTER — Ambulatory Visit: Payer: Self-pay | Admitting: Cardiology

## 2024-02-05 ENCOUNTER — Telehealth: Payer: Self-pay | Admitting: Cardiology

## 2024-02-05 NOTE — Telephone Encounter (Signed)
 Pt cancelled 8/19 appt being that Echo results were good. Pt would like a c/b regarding when she needs to f/u back up with Dr. Sheena, 6 mo or 1 yr. Please advise

## 2024-02-08 ENCOUNTER — Other Ambulatory Visit: Payer: Self-pay | Admitting: Internal Medicine

## 2024-02-11 NOTE — Telephone Encounter (Signed)
 Left a message to call back.  Recall for 1 year placed per Dr. Sheena.

## 2024-02-12 ENCOUNTER — Ambulatory Visit: Admitting: Cardiology

## 2024-02-20 ENCOUNTER — Encounter: Payer: Self-pay | Admitting: Podiatry

## 2024-02-20 ENCOUNTER — Ambulatory Visit (INDEPENDENT_AMBULATORY_CARE_PROVIDER_SITE_OTHER): Admitting: Podiatry

## 2024-02-20 VITALS — Ht 71.0 in | Wt 186.0 lb

## 2024-02-20 DIAGNOSIS — Q828 Other specified congenital malformations of skin: Secondary | ICD-10-CM

## 2024-02-20 DIAGNOSIS — E1140 Type 2 diabetes mellitus with diabetic neuropathy, unspecified: Secondary | ICD-10-CM

## 2024-02-20 DIAGNOSIS — E1149 Type 2 diabetes mellitus with other diabetic neurological complication: Secondary | ICD-10-CM | POA: Diagnosis not present

## 2024-02-20 NOTE — Progress Notes (Signed)
 Subjective:   Patient ID: Kristina Long, female   DOB: 68 y.o.   MRN: [id number]   HPI Patient presents with calluses bilateral that are very painful with patient being a diabetic and has diminished sensation   ROS      Objective:  Physical Exam  There are status intact neurological diminishment sharp dull vibratory with severe lesion formations of fifth metatarsal head bilateral and hallux bilateral thickened and painful when pressed along with fifth digit     Assessment:  At risk diabetic long-term diabetes with chronic lesion formation subfifth metatarsal head bilateral hallux and fifth digit bilateral     Plan:  H&P reviewed condition debrided lesions debrided bilateral no iatrogenic bleeding reappoint routine care patient is high risk

## 2024-02-22 ENCOUNTER — Other Ambulatory Visit: Payer: Self-pay | Admitting: Internal Medicine

## 2024-02-26 ENCOUNTER — Telehealth: Payer: Self-pay

## 2024-02-26 MED ORDER — ACCU-CHEK FASTCLIX LANCETS MISC: 3 refills | Status: DC

## 2024-02-26 NOTE — Telephone Encounter (Signed)
 Prescription sent

## 2024-03-03 ENCOUNTER — Other Ambulatory Visit: Payer: Self-pay | Admitting: Internal Medicine

## 2024-03-03 DIAGNOSIS — E1165 Type 2 diabetes mellitus with hyperglycemia: Secondary | ICD-10-CM

## 2024-03-03 MED ORDER — ACCU-CHEK GUIDE W/DEVICE KIT: PACK | 0 refills | Status: AC

## 2024-03-06 ENCOUNTER — Ambulatory Visit: Admitting: Cardiology

## 2024-03-10 ENCOUNTER — Other Ambulatory Visit: Payer: Self-pay | Admitting: Internal Medicine

## 2024-04-02 ENCOUNTER — Other Ambulatory Visit: Payer: Self-pay

## 2024-04-02 ENCOUNTER — Other Ambulatory Visit: Payer: Self-pay | Admitting: Internal Medicine

## 2024-04-02 MED ORDER — METFORMIN HCL ER 500 MG PO TB24: 1000.0000 mg | ORAL_TABLET | Freq: Every day | ORAL | 1 refills | Status: AC

## 2024-04-08 ENCOUNTER — Other Ambulatory Visit: Payer: Self-pay | Admitting: Internal Medicine

## 2024-04-09 ENCOUNTER — Other Ambulatory Visit: Payer: Self-pay

## 2024-04-09 MED ORDER — ACCU-CHEK SOFTCLIX LANCETS MISC: 12 refills | Status: AC

## 2024-05-08 ENCOUNTER — Ambulatory Visit: Admitting: Podiatry

## 2024-05-08 ENCOUNTER — Encounter: Payer: Self-pay | Admitting: Podiatry

## 2024-05-08 DIAGNOSIS — E1140 Type 2 diabetes mellitus with diabetic neuropathy, unspecified: Secondary | ICD-10-CM | POA: Diagnosis not present

## 2024-05-08 DIAGNOSIS — Q828 Other specified congenital malformations of skin: Secondary | ICD-10-CM

## 2024-05-08 DIAGNOSIS — E1149 Type 2 diabetes mellitus with other diabetic neurological complication: Secondary | ICD-10-CM | POA: Diagnosis not present

## 2024-05-08 NOTE — Progress Notes (Signed)
 Subjective:   Patient ID: Kristina Long, female   DOB: 68 y.o.   MRN: [id number]   HPI Patient presents with chronic lesions bilateral that are painful and is a diabetic with neuropathic change   ROS      Objective:  Physical Exam  Neurovascular status unchanged thick keratotic lesion subthird fifth metatarsal second digit right with the lesions on the metatarsals being bilateral measuring about 1 cm x 1 cm.  They are painful when palpated     Assessment:  Chronic lesion bilateral with neurological change secondary to diabetes     Plan:  Sharp sterile debridement of all lesions no iatrogenic bleeding reappoint routine care

## 2024-05-29 ENCOUNTER — Telehealth: Payer: Self-pay | Admitting: Dietician

## 2024-05-29 NOTE — Telephone Encounter (Signed)
 Patient has called and states that the lancets that she picked up do not match her blood glucose monitor lancing device.  Chart reviewed.  She has a prescription both for soft clix and fast clix by [facility].  She is at the pharmacy now.  Pharmacy states that they will order them and she can pick them up tomorrow.  Leita Constable, RD, LDN, CDCES, DipACLM

## 2024-06-02 ENCOUNTER — Ambulatory Visit: Admitting: Internal Medicine

## 2024-07-13 ENCOUNTER — Other Ambulatory Visit: Payer: Self-pay | Admitting: Internal Medicine

## 2024-09-09 ENCOUNTER — Ambulatory Visit: Admitting: Internal Medicine
